# Patient Record
Sex: Female | Born: 1940 | Race: White | Hispanic: Yes | Smoking: Former smoker
Health system: Southern US, Community
[De-identification: ages and names within clinical notes are randomized; demographics above are authoritative.]

## PROBLEM LIST (undated history)

## (undated) DIAGNOSIS — I509 Heart failure, unspecified: Secondary | ICD-10-CM

## (undated) DIAGNOSIS — Z8719 Personal history of other diseases of the digestive system: Secondary | ICD-10-CM

## (undated) DIAGNOSIS — L409 Psoriasis, unspecified: Secondary | ICD-10-CM

## (undated) DIAGNOSIS — R899 Unspecified abnormal finding in specimens from other organs, systems and tissues: Secondary | ICD-10-CM

## (undated) DIAGNOSIS — I10 Essential (primary) hypertension: Secondary | ICD-10-CM

## (undated) DIAGNOSIS — G8929 Other chronic pain: Secondary | ICD-10-CM

## (undated) DIAGNOSIS — E119 Type 2 diabetes mellitus without complications: Secondary | ICD-10-CM

## (undated) DIAGNOSIS — K5730 Diverticulosis of large intestine without perforation or abscess without bleeding: Secondary | ICD-10-CM

## (undated) DIAGNOSIS — M549 Dorsalgia, unspecified: Secondary | ICD-10-CM

## (undated) DIAGNOSIS — F32A Depression, unspecified: Secondary | ICD-10-CM

## (undated) DIAGNOSIS — D649 Anemia, unspecified: Secondary | ICD-10-CM

## (undated) DIAGNOSIS — Z87442 Personal history of urinary calculi: Secondary | ICD-10-CM

## (undated) DIAGNOSIS — M797 Fibromyalgia: Secondary | ICD-10-CM

## (undated) DIAGNOSIS — I350 Nonrheumatic aortic (valve) stenosis: Secondary | ICD-10-CM

## (undated) DIAGNOSIS — M1990 Unspecified osteoarthritis, unspecified site: Secondary | ICD-10-CM

## (undated) DIAGNOSIS — K589 Irritable bowel syndrome without diarrhea: Secondary | ICD-10-CM

## (undated) DIAGNOSIS — K760 Fatty (change of) liver, not elsewhere classified: Secondary | ICD-10-CM

## (undated) DIAGNOSIS — F329 Major depressive disorder, single episode, unspecified: Secondary | ICD-10-CM

## (undated) DIAGNOSIS — Z952 Presence of prosthetic heart valve: Secondary | ICD-10-CM

## (undated) DIAGNOSIS — K644 Residual hemorrhoidal skin tags: Secondary | ICD-10-CM

## (undated) DIAGNOSIS — F419 Anxiety disorder, unspecified: Secondary | ICD-10-CM

## (undated) DIAGNOSIS — E785 Hyperlipidemia, unspecified: Secondary | ICD-10-CM

## (undated) DIAGNOSIS — R51 Headache: Secondary | ICD-10-CM

## (undated) DIAGNOSIS — R519 Headache, unspecified: Secondary | ICD-10-CM

## (undated) DIAGNOSIS — G4730 Sleep apnea, unspecified: Secondary | ICD-10-CM

## (undated) HISTORY — PX: BACK SURGERY: SHX140

## (undated) HISTORY — PX: LUMBAR DISC SURGERY: SHX700

## (undated) HISTORY — DX: Fibromyalgia: M79.7

## (undated) HISTORY — DX: Fatty (change of) liver, not elsewhere classified: K76.0

## (undated) HISTORY — PX: LAPAROSCOPIC CHOLECYSTECTOMY: SUR755

## (undated) HISTORY — DX: Hyperlipidemia, unspecified: E78.5

## (undated) HISTORY — PX: HEMORRHOID BANDING: SHX5850

## (undated) HISTORY — PX: APPENDECTOMY: SHX54

## (undated) HISTORY — DX: Irritable bowel syndrome, unspecified: K58.9

## (undated) HISTORY — DX: Diverticulosis of large intestine without perforation or abscess without bleeding: K57.30

## (undated) HISTORY — PX: CATARACT EXTRACTION W/ INTRAOCULAR LENS  IMPLANT, BILATERAL: SHX1307

## (undated) HISTORY — DX: Residual hemorrhoidal skin tags: K64.4

## (undated) HISTORY — DX: Psoriasis, unspecified: L40.9

## (undated) HISTORY — PX: ABDOMINAL HYSTERECTOMY: SHX81

## (undated) HISTORY — DX: Anxiety disorder, unspecified: F41.9

## (undated) HISTORY — PX: EYE SURGERY: SHX253

## (undated) HISTORY — PX: BREAST LUMPECTOMY: SHX2

## (undated) HISTORY — PX: RETINAL DETACHMENT SURGERY: SHX105

## (undated) HISTORY — PX: ABDOMINAL ADHESION SURGERY: SHX90

## (undated) HISTORY — DX: Essential (primary) hypertension: I10

## (undated) HISTORY — PX: TONSILLECTOMY: SUR1361

---

## 1968-02-23 DIAGNOSIS — Z8711 Personal history of peptic ulcer disease: Secondary | ICD-10-CM

## 1968-02-23 HISTORY — DX: Personal history of peptic ulcer disease: Z87.11

## 1999-04-08 ENCOUNTER — Other Ambulatory Visit: Admission: RE | Admit: 1999-04-08 | Discharge: 1999-04-08 | Payer: Self-pay | Admitting: Gastroenterology

## 1999-04-08 ENCOUNTER — Encounter (INDEPENDENT_AMBULATORY_CARE_PROVIDER_SITE_OTHER): Payer: Self-pay | Admitting: Specialist

## 1999-04-30 ENCOUNTER — Ambulatory Visit (HOSPITAL_COMMUNITY): Admission: RE | Admit: 1999-04-30 | Discharge: 1999-04-30 | Payer: Self-pay | Admitting: Gastroenterology

## 1999-04-30 ENCOUNTER — Encounter: Payer: Self-pay | Admitting: Gastroenterology

## 1999-05-22 ENCOUNTER — Encounter (INDEPENDENT_AMBULATORY_CARE_PROVIDER_SITE_OTHER): Payer: Self-pay | Admitting: Specialist

## 1999-05-22 ENCOUNTER — Other Ambulatory Visit: Admission: RE | Admit: 1999-05-22 | Discharge: 1999-05-22 | Payer: Self-pay | Admitting: Gastroenterology

## 1999-07-21 ENCOUNTER — Encounter: Payer: Self-pay | Admitting: General Surgery

## 1999-07-23 ENCOUNTER — Encounter (INDEPENDENT_AMBULATORY_CARE_PROVIDER_SITE_OTHER): Payer: Self-pay | Admitting: Specialist

## 1999-07-23 ENCOUNTER — Observation Stay (HOSPITAL_COMMUNITY): Admission: RE | Admit: 1999-07-23 | Discharge: 1999-07-24 | Payer: Self-pay | Admitting: General Surgery

## 1999-08-05 ENCOUNTER — Encounter: Payer: Self-pay | Admitting: Family Medicine

## 1999-08-05 ENCOUNTER — Ambulatory Visit (HOSPITAL_COMMUNITY): Admission: RE | Admit: 1999-08-05 | Discharge: 1999-08-05 | Payer: Self-pay | Admitting: Family Medicine

## 2000-11-23 ENCOUNTER — Ambulatory Visit (HOSPITAL_COMMUNITY): Admission: RE | Admit: 2000-11-23 | Discharge: 2000-11-23 | Payer: Self-pay | Admitting: Pediatrics

## 2001-08-22 ENCOUNTER — Encounter: Admission: RE | Admit: 2001-08-22 | Discharge: 2001-08-22 | Payer: Self-pay | Admitting: Family Medicine

## 2001-08-22 ENCOUNTER — Encounter: Payer: Self-pay | Admitting: Family Medicine

## 2003-12-20 ENCOUNTER — Ambulatory Visit (HOSPITAL_COMMUNITY): Admission: RE | Admit: 2003-12-20 | Discharge: 2003-12-20 | Payer: Self-pay | Admitting: Family Medicine

## 2004-07-13 ENCOUNTER — Ambulatory Visit: Payer: Self-pay | Admitting: Gastroenterology

## 2004-07-27 ENCOUNTER — Encounter (INDEPENDENT_AMBULATORY_CARE_PROVIDER_SITE_OTHER): Payer: Self-pay | Admitting: Specialist

## 2004-07-27 ENCOUNTER — Ambulatory Visit: Payer: Self-pay | Admitting: Gastroenterology

## 2004-07-27 DIAGNOSIS — K644 Residual hemorrhoidal skin tags: Secondary | ICD-10-CM | POA: Insufficient documentation

## 2004-07-27 DIAGNOSIS — D126 Benign neoplasm of colon, unspecified: Secondary | ICD-10-CM | POA: Insufficient documentation

## 2004-07-27 DIAGNOSIS — K5730 Diverticulosis of large intestine without perforation or abscess without bleeding: Secondary | ICD-10-CM | POA: Insufficient documentation

## 2007-06-26 ENCOUNTER — Emergency Department (HOSPITAL_COMMUNITY): Admission: EM | Admit: 2007-06-26 | Discharge: 2007-06-26 | Payer: Self-pay | Admitting: Emergency Medicine

## 2007-07-18 ENCOUNTER — Ambulatory Visit: Payer: Self-pay | Admitting: Gastroenterology

## 2007-07-18 DIAGNOSIS — K219 Gastro-esophageal reflux disease without esophagitis: Secondary | ICD-10-CM | POA: Insufficient documentation

## 2007-07-18 DIAGNOSIS — R1319 Other dysphagia: Secondary | ICD-10-CM | POA: Insufficient documentation

## 2007-07-18 DIAGNOSIS — K760 Fatty (change of) liver, not elsewhere classified: Secondary | ICD-10-CM

## 2007-07-18 DIAGNOSIS — R197 Diarrhea, unspecified: Secondary | ICD-10-CM

## 2007-07-18 DIAGNOSIS — E669 Obesity, unspecified: Secondary | ICD-10-CM

## 2007-07-28 ENCOUNTER — Encounter (INDEPENDENT_AMBULATORY_CARE_PROVIDER_SITE_OTHER): Payer: Self-pay | Admitting: *Deleted

## 2007-08-02 ENCOUNTER — Encounter: Payer: Self-pay | Admitting: Gastroenterology

## 2007-08-02 ENCOUNTER — Ambulatory Visit: Payer: Self-pay | Admitting: Gastroenterology

## 2007-08-03 ENCOUNTER — Telehealth (INDEPENDENT_AMBULATORY_CARE_PROVIDER_SITE_OTHER): Payer: Self-pay | Admitting: *Deleted

## 2007-08-03 ENCOUNTER — Telehealth: Payer: Self-pay | Admitting: Gastroenterology

## 2007-08-04 ENCOUNTER — Emergency Department (HOSPITAL_COMMUNITY): Admission: EM | Admit: 2007-08-04 | Discharge: 2007-08-04 | Payer: Self-pay | Admitting: Emergency Medicine

## 2007-08-04 ENCOUNTER — Telehealth: Payer: Self-pay | Admitting: Internal Medicine

## 2007-08-08 ENCOUNTER — Encounter: Payer: Self-pay | Admitting: Gastroenterology

## 2007-08-10 ENCOUNTER — Encounter: Payer: Self-pay | Admitting: Gastroenterology

## 2007-08-10 ENCOUNTER — Ambulatory Visit: Payer: Self-pay | Admitting: Internal Medicine

## 2007-08-22 ENCOUNTER — Ambulatory Visit (HOSPITAL_COMMUNITY): Admission: RE | Admit: 2007-08-22 | Discharge: 2007-08-22 | Payer: Self-pay | Admitting: Orthopedic Surgery

## 2007-08-22 ENCOUNTER — Ambulatory Visit: Payer: Self-pay | Admitting: Gastroenterology

## 2007-08-22 DIAGNOSIS — R1310 Dysphagia, unspecified: Secondary | ICD-10-CM | POA: Insufficient documentation

## 2007-08-22 DIAGNOSIS — E1169 Type 2 diabetes mellitus with other specified complication: Secondary | ICD-10-CM

## 2007-08-24 ENCOUNTER — Encounter (INDEPENDENT_AMBULATORY_CARE_PROVIDER_SITE_OTHER): Payer: Self-pay | Admitting: *Deleted

## 2007-08-24 LAB — CONVERTED CEMR LAB
ALT: 35 units/L (ref 0–35)
AST: 33 units/L (ref 0–37)
Albumin: 3.6 g/dL (ref 3.5–5.2)
Alkaline Phosphatase: 80 units/L (ref 39–117)
BUN: 18 mg/dL (ref 6–23)
Basophils Absolute: 0.1 10*3/uL (ref 0.0–0.1)
Basophils Relative: 0.6 % (ref 0.0–1.0)
Bilirubin, Direct: 0.1 mg/dL (ref 0.0–0.3)
CO2: 28 meq/L (ref 19–32)
Calcium: 9.9 mg/dL (ref 8.4–10.5)
Chloride: 101 meq/L (ref 96–112)
Creatinine, Ser: 1.1 mg/dL (ref 0.4–1.2)
Eosinophils Absolute: 0.2 10*3/uL (ref 0.0–0.7)
Eosinophils Relative: 1.1 % (ref 0.0–5.0)
GFR calc Af Amer: 64 mL/min
GFR calc non Af Amer: 53 mL/min
Glucose, Bld: 215 mg/dL — ABNORMAL HIGH (ref 70–99)
HCT: 42.5 % (ref 36.0–46.0)
Hemoglobin: 14.7 g/dL (ref 12.0–15.0)
Hgb A1c MFr Bld: 9.7 % — ABNORMAL HIGH (ref 4.6–6.0)
INR: 1.2 — ABNORMAL HIGH (ref 0.8–1.0)
Lymphocytes Relative: 32 % (ref 12.0–46.0)
MCHC: 34.5 g/dL (ref 30.0–36.0)
MCV: 89.5 fL (ref 78.0–100.0)
Monocytes Absolute: 0.6 10*3/uL (ref 0.1–1.0)
Monocytes Relative: 3.5 % (ref 3.0–12.0)
Neutro Abs: 11.3 10*3/uL — ABNORMAL HIGH (ref 1.4–7.7)
Neutrophils Relative %: 62.8 % (ref 43.0–77.0)
Platelets: 302 10*3/uL (ref 150–400)
Potassium: 4.2 meq/L (ref 3.5–5.1)
Prothrombin Time: 13.7 s — ABNORMAL HIGH (ref 10.9–13.3)
RBC: 4.75 M/uL (ref 3.87–5.11)
RDW: 13.9 % (ref 11.5–14.6)
Sodium: 140 meq/L (ref 135–145)
TSH: 1.57 microintl units/mL (ref 0.35–5.50)
Total Bilirubin: 0.6 mg/dL (ref 0.3–1.2)
Total Protein: 8.1 g/dL (ref 6.0–8.3)
WBC: 17.9 10*3/uL — ABNORMAL HIGH (ref 4.5–10.5)
aPTT: 29.6 s (ref 21.7–29.8)

## 2007-09-01 ENCOUNTER — Telehealth: Payer: Self-pay | Admitting: Gastroenterology

## 2007-09-04 ENCOUNTER — Ambulatory Visit: Payer: Self-pay | Admitting: Gastroenterology

## 2007-09-04 LAB — CONVERTED CEMR LAB
Bilirubin Urine: NEGATIVE
Hemoglobin, Urine: NEGATIVE
Nitrite: NEGATIVE
Total Protein, Urine: NEGATIVE mg/dL

## 2007-09-07 ENCOUNTER — Ambulatory Visit: Payer: Self-pay | Admitting: Cardiovascular Disease

## 2007-09-08 ENCOUNTER — Encounter (INDEPENDENT_AMBULATORY_CARE_PROVIDER_SITE_OTHER): Payer: Self-pay | Admitting: *Deleted

## 2007-10-02 ENCOUNTER — Telehealth: Payer: Self-pay | Admitting: Gastroenterology

## 2007-12-11 ENCOUNTER — Telehealth (INDEPENDENT_AMBULATORY_CARE_PROVIDER_SITE_OTHER): Payer: Self-pay

## 2010-03-15 ENCOUNTER — Encounter: Payer: Self-pay | Admitting: Gastroenterology

## 2010-05-18 ENCOUNTER — Other Ambulatory Visit: Payer: Self-pay | Admitting: Family Medicine

## 2010-05-18 DIAGNOSIS — R109 Unspecified abdominal pain: Secondary | ICD-10-CM

## 2010-05-21 ENCOUNTER — Ambulatory Visit
Admission: RE | Admit: 2010-05-21 | Discharge: 2010-05-21 | Disposition: A | Discharge status: Home / Self Care | Payer: Medicare Other | Source: Ambulatory Visit | Attending: Family Medicine | Admitting: Family Medicine

## 2010-05-21 DIAGNOSIS — R109 Unspecified abdominal pain: Secondary | ICD-10-CM

## 2010-05-21 MED ORDER — IOHEXOL 300 MG/ML  SOLN
125.0000 mL | Freq: Once | INTRAMUSCULAR | Status: AC | PRN
  Administered 2010-05-21: 125 mL via INTRAVENOUS

## 2010-05-26 ENCOUNTER — Ambulatory Visit: Payer: Self-pay | Admitting: Gastroenterology

## 2010-06-16 ENCOUNTER — Ambulatory Visit: Payer: Self-pay | Admitting: Gastroenterology

## 2010-06-30 ENCOUNTER — Ambulatory Visit: Payer: Self-pay | Admitting: Gastroenterology

## 2010-07-10 NOTE — Op Note (Signed)
[facility]  Patient:    [name], [name]                       MRN: [id number] Adm. Date:  [id number] Disc. Date: [id number] Attending:  Arlis Porta CC:         Vania Rea. Jarold Motto, M.D. [facility]             Dr. Purnell Shoemaker, [facility] [place]                           Operative Report  PREOPERATIVE DIAGNOSIS:  Chronic calculous cholecystitis.  POSTOPERATIVE DIAGNOSIS:  Chronic calculous cholecystitis.  PROCEDURE:  Laparoscopic cholecystectomy.  SURGEON:  Adolph Pollack, M.D.  ASSISTANT:  None.  ANESTHESIA:  General.  INDICATION:  Ms. Elrod is a 70 year old female with some epigastric pain radiating around to the back.  She has also had diarrhea.  Dr. Sheryn Bison has done an extensive evaluation on her.  Her ultrasound demonstrated at least a 1.8 cm gallstone.  She has normal liver function tests.  It is felt that her abdominal pain may be biliary colic in nature, and she now presents for an elective cholecystectomy.  DESCRIPTION OF PROCEDURE:  She was placed supine on the operating table, and a general anesthetic was administered.  Her abdomen was sterilely prepped and draped.  Marcaine 0.5% plain was infiltrated in the subumbilical region, and a longitudinal subumbilical incision was made through the skin and carried down through subcutaneous tissue.  The midline fascia was identified and a 1 cm incision made in the midline fascia.  The peritoneal cavity was then entered bluntly and under direct vision.  A pursestring suture of 0 Vicryl was placed around the fascial edges.  A Hasson trocar was introduced to the peritoneal cavity and a pneumoperitoneum created by insufflation of CO2 gas.  Next, the patient was positioned appropriately.  An 11 mm trocar was placed through an epigastric incision and two 5 mm trocars placed in the right abdomen, all under direct vision.  The fundus of the gallbladder was grasped and retracted toward the  right shoulder.  Adhesions between the body of the gallbladder, omentum, and duodenum were noted and taken down bluntly.  I was then able to grasp the infundibulum and retract it laterally.  Using careful blunt dissection, anterior branch of the cystic artery was identified, clipped twice proximally and once distally, and divided.  The cystic duct was then identified and skeletonized for a short direction.  It was clipped three times proximally, once distally, and divided.  The posterior branch of the cystic artery was then identified, clipped, and divided.  The gallbladder was then dissected free from the liver bed using electrocautery.  There was a small hole made in the gallbladder, but the stone did not leak out.  There was spill of bile. Once the gallbladder was removed from the liver bed, it was placed in an Endopouch bag.  Next, the perihepatic area and the gallbladder fossa were irrigated with saline.  There was still some bleeding noted, and this was controlled with cautery and a piece of Surgicel.  There was no active bile leak that I could see.  I then evacuated as much of the fluid as possible.  I was pulling the Endopouch bag with the gallbladder out through the subumbilical incision when the bag broke and the gallstone spilled into the abdominal cavity.  I took a second Endopouch  bag, retrieved the gallstone, and pulled it out.  There was minimal bile spillage into the abdominal cavity with this maneuver.  I then closed the subumbilical fascial defect with interrupted 3-0 Vicryl sutures under direct vision.  I then reinsufflated the abdomen with CO2 gas.  I reinspected the gallbladder fossa and again noted no bleeding or bile leakage and evacuated residual fluid.  The remaining trocars were removed and pneumoperitoneum was released.  The skin incisions were closed with 4-0 Monocryl subcuticular stitches, followed by Steri-Strips and sterile dressings.  She tolerated the  procedure well without any apparent complications and was taken to the recovery room in satisfactory condition. DD:  07/23/99 TD:  07/27/99 Job: [id number]

## 2010-07-17 ENCOUNTER — Telehealth: Payer: Self-pay | Admitting: Gastroenterology

## 2010-07-17 ENCOUNTER — Encounter: Payer: Self-pay | Admitting: *Deleted

## 2010-07-17 ENCOUNTER — Ambulatory Visit: Payer: Self-pay | Admitting: Gastroenterology

## 2010-07-17 NOTE — Telephone Encounter (Signed)
D/c needed.Marland KitchenMarland Kitchen

## 2010-07-17 NOTE — Telephone Encounter (Signed)
Letter done and will send to medical records to have them mail out to pt.

## 2010-07-17 NOTE — Telephone Encounter (Signed)
Dr Jarold Motto this patient has been on your schedule 4 times.   4/3 -- cx same day 4/24 -- cx day before 5/8 -- cx day before 5/25 -- cx same day.  Would you like to continue to see this patient??

## 2010-07-17 NOTE — Telephone Encounter (Signed)
Dc letter done and routed to DRP to sign.

## 2010-07-21 ENCOUNTER — Telehealth: Payer: Self-pay | Admitting: Gastroenterology

## 2010-07-21 NOTE — Telephone Encounter (Signed)
Dismissal Letter was sent by Certified Mail on 07/21/2010  Dismissal Letter returned and sent back out by 1st Class Mail 08/10/2010

## 2010-08-20 ENCOUNTER — Other Ambulatory Visit (HOSPITAL_COMMUNITY): Payer: Self-pay | Admitting: Family Medicine

## 2010-08-20 DIAGNOSIS — Z1231 Encounter for screening mammogram for malignant neoplasm of breast: Secondary | ICD-10-CM

## 2010-09-02 ENCOUNTER — Ambulatory Visit: Payer: Self-pay | Admitting: Gastroenterology

## 2010-09-07 ENCOUNTER — Ambulatory Visit (HOSPITAL_COMMUNITY)
Admission: RE | Admit: 2010-09-07 | Discharge: 2010-09-07 | Disposition: A | Discharge status: Home / Self Care | Payer: Medicare Other | Source: Ambulatory Visit | Attending: Family Medicine | Admitting: Family Medicine

## 2010-09-07 DIAGNOSIS — Z1231 Encounter for screening mammogram for malignant neoplasm of breast: Secondary | ICD-10-CM | POA: Insufficient documentation

## 2010-09-25 ENCOUNTER — Telehealth: Payer: Self-pay | Admitting: Gastroenterology

## 2010-09-25 NOTE — Telephone Encounter (Signed)
Message copied by Arna Snipe on Fri Sep 25, 2010  2:53 PM ------      Message from: Harlow Mares D      Created: Tue May 26, 2010  9:01 AM       Please charge patient for same day cx per Dr Jarold Motto.                   ----- Message -----         From: Karna Christmas         Sent: 05/26/2010   8:43 AM           To: Harlow Mares, CMA            Pt. resch'd her appt. Until 06-16-10 b/c she is waiting on her PCP to call her with test results before seeing Dr. Jarold Motto

## 2010-11-19 LAB — CBC
HCT: 40.2
Hemoglobin: 13.7
Platelets: 199
WBC: 11.3 — ABNORMAL HIGH

## 2010-11-19 LAB — DIFFERENTIAL
Eosinophils Relative: 3
Lymphocytes Relative: 37
Lymphs Abs: 4.2 — ABNORMAL HIGH
Monocytes Absolute: 0.5
Neutro Abs: 6.2

## 2011-08-06 ENCOUNTER — Encounter (HOSPITAL_COMMUNITY): Payer: Self-pay | Admitting: Emergency Medicine

## 2011-08-06 ENCOUNTER — Emergency Department (HOSPITAL_COMMUNITY)
Admission: EM | Admit: 2011-08-06 | Discharge: 2011-08-06 | Disposition: A | Discharge status: Home Health | Payer: Medicare Other | Attending: Emergency Medicine | Admitting: Emergency Medicine

## 2011-08-06 ENCOUNTER — Emergency Department (HOSPITAL_COMMUNITY): Payer: Medicare Other

## 2011-08-06 DIAGNOSIS — I10 Essential (primary) hypertension: Secondary | ICD-10-CM | POA: Insufficient documentation

## 2011-08-06 DIAGNOSIS — Z885 Allergy status to narcotic agent status: Secondary | ICD-10-CM | POA: Insufficient documentation

## 2011-08-06 DIAGNOSIS — R0600 Dyspnea, unspecified: Secondary | ICD-10-CM

## 2011-08-06 DIAGNOSIS — N390 Urinary tract infection, site not specified: Secondary | ICD-10-CM

## 2011-08-06 DIAGNOSIS — Z87891 Personal history of nicotine dependence: Secondary | ICD-10-CM | POA: Insufficient documentation

## 2011-08-06 DIAGNOSIS — Z880 Allergy status to penicillin: Secondary | ICD-10-CM | POA: Insufficient documentation

## 2011-08-06 DIAGNOSIS — E785 Hyperlipidemia, unspecified: Secondary | ICD-10-CM | POA: Insufficient documentation

## 2011-08-06 DIAGNOSIS — R0602 Shortness of breath: Secondary | ICD-10-CM | POA: Insufficient documentation

## 2011-08-06 DIAGNOSIS — Z79899 Other long term (current) drug therapy: Secondary | ICD-10-CM | POA: Insufficient documentation

## 2011-08-06 DIAGNOSIS — Z882 Allergy status to sulfonamides status: Secondary | ICD-10-CM | POA: Insufficient documentation

## 2011-08-06 DIAGNOSIS — R21 Rash and other nonspecific skin eruption: Secondary | ICD-10-CM | POA: Insufficient documentation

## 2011-08-06 LAB — URINE MICROSCOPIC-ADD ON

## 2011-08-06 LAB — DIFFERENTIAL
Basophils Absolute: 0 10*3/uL (ref 0.0–0.1)
Eosinophils Relative: 3 % (ref 0–5)
Lymphocytes Relative: 41 % (ref 12–46)
Monocytes Absolute: 0.7 10*3/uL (ref 0.1–1.0)
Monocytes Relative: 5 % (ref 3–12)

## 2011-08-06 LAB — COMPREHENSIVE METABOLIC PANEL
AST: 19 U/L (ref 0–37)
BUN: 12 mg/dL (ref 6–23)
CO2: 23 mEq/L (ref 19–32)
Calcium: 9.2 mg/dL (ref 8.4–10.5)
Creatinine, Ser: 0.65 mg/dL (ref 0.50–1.10)
GFR calc Af Amer: >90 mL/min (ref >90)
GFR calc non Af Amer: 89 mL/min — ABNORMAL LOW (ref >90)
Glucose, Bld: 111 mg/dL — ABNORMAL HIGH (ref 70–99)
Total Bilirubin: 0.2 mg/dL — ABNORMAL LOW (ref 0.3–1.2)

## 2011-08-06 LAB — URINALYSIS, ROUTINE W REFLEX MICROSCOPIC
Ketones, ur: NEGATIVE mg/dL
Protein, ur: NEGATIVE mg/dL
Urobilinogen, UA: 0.2 mg/dL (ref 0.0–1.0)

## 2011-08-06 LAB — CBC
HCT: 41 % (ref 36.0–46.0)
Hemoglobin: 13.4 g/dL (ref 12.0–15.0)
MCV: 89.7 fL (ref 78.0–100.0)
RDW: 14 % (ref 11.5–15.5)
WBC: 12.6 10*3/uL — ABNORMAL HIGH (ref 4.0–10.5)

## 2011-08-06 LAB — LIPASE, BLOOD: Lipase: 20 U/L (ref 11–59)

## 2011-08-06 LAB — D-DIMER, QUANTITATIVE: D-Dimer, Quant: 0.33 ug/mL-FEU (ref 0.00–0.48)

## 2011-08-06 MED ORDER — CEFUROXIME AXETIL 250 MG PO TABS: 250.0000 mg | ORAL_TABLET | Freq: Two times a day (BID) | ORAL | Status: AC

## 2011-08-06 MED ORDER — CIPROFLOXACIN HCL 250 MG PO TABS: 250.0000 mg | ORAL_TABLET | Freq: Two times a day (BID) | ORAL | Status: DC

## 2011-08-06 NOTE — ED Notes (Signed)
Discharge instructions reviewed with pt; verbalizes understanding.  No questions asked; No further c/o's voiced.  Pt to lobby via wheelchair.  NAD noted.

## 2011-08-06 NOTE — ED Notes (Signed)
Pt c/o pain in between shoulder blades x 3 days and SOB starting last night; pt speaking complete sentences at present; pt sts some cough

## 2011-08-06 NOTE — ED Provider Notes (Signed)
History     CSN: [id number]  Arrival date & time 08/06/11  4098   First MD Initiated Contact with Patient 08/06/11 [phone]      Chief Complaint  Patient presents with  . Shortness of Breath  . Back Pain    (Consider location/radiation/quality/duration/timing/severity/associated sxs/prior treatment) HPI Pt has had episode of SOB 2 days ago between 3-4 AM. Pt related eating heavy meal the night before and thought symptoms likely "indigestion". SOB resolved. She then had another episode this morning  around 0400 not associated with any pain. To baby aspirin, goody powder. SOB now mostly resolved. Pt denies fever, chills, productive cough, chest pain, back pain, recent travel or surgeries.  Past Medical History  Diagnosis Date  . Esophageal reflux   . Obesity, unspecified   . Diarrhea   . External hemorrhoids without mention of complication   . Personal history of colonic polyps     adenomatous & TUBULAR ADENOMA  . Diverticulosis of colon (without mention of hemorrhage)   . Psoriasis   . Fibromyalgia   . Hypertension   . IBS (irritable bowel syndrome)   . Hyperlipemia   . Fatty liver disease, nonalcoholic     Past Surgical History  Procedure Date  . Appendectomy   . Vaginal hysterectomy   . Cholecystectomy   . Back surgery     Family History  Problem Relation Age of Onset  . Lung cancer Mother   . Diabetes Maternal Grandmother   . Heart disease Maternal Grandfather   . Colon cancer Neg Hx     History  Substance Use Topics  . Smoking status: Former Games developer  . Smokeless tobacco: Never Used  . Alcohol Use: No    OB History    Grav Para Term Preterm Abortions TAB SAB Ect Mult Living                  Review of Systems  Constitutional: Negative for fever and chills.  HENT: Negative for neck pain.   Respiratory: Positive for shortness of breath. Negative for cough and wheezing.   Cardiovascular: Negative for chest pain, palpitations and leg swelling.    Gastrointestinal: Negative for nausea, vomiting and abdominal pain.  Musculoskeletal: Negative for back pain.  Skin: Negative for rash and wound.  Neurological: Negative for dizziness, weakness, light-headedness, numbness and headaches.    Allergies  Sulfonamide derivatives; Amoxicillin; and Hydrocodone  Home Medications   Current Outpatient Rx  Name Route Sig Dispense Refill  . LISINOPRIL-HYDROCHLOROTHIAZIDE 20-25 MG PO TABS Oral Take 1 tablet by mouth daily.     Marland Kitchen METFORMIN HCL ER (MOD) 1000 MG PO TB24 Oral Take 500 mg by mouth 2 (two) times daily with a meal.     . PRAVASTATIN SODIUM 40 MG PO TABS Oral Take 40 mg by mouth daily.      Marland Kitchen CIPROFLOXACIN HCL 250 MG PO TABS Oral Take 1 tablet (250 mg total) by mouth every 12 (twelve) hours. 10 tablet 0    BP 176/70  Pulse 68  Temp 97.7 F (36.5 C) (Oral)  Resp 18  SpO2 98%  Physical Exam  Nursing note and vitals reviewed. Constitutional: She is oriented to person, place, and time. She appears well-developed and well-nourished. No distress.       Central obesity  HENT:  Head: Normocephalic and atraumatic.  Mouth/Throat: Oropharynx is clear and moist.  Eyes: EOM are normal. Pupils are equal, round, and reactive to light.  Neck: Normal range of motion. Neck supple.  Cardiovascular: Normal rate and regular rhythm.   Pulmonary/Chest: Effort normal and breath sounds normal. No respiratory distress. She has no wheezes. She has no rales. She exhibits no tenderness.  Abdominal: Soft. Bowel sounds are normal. There is no tenderness. There is no rebound and no guarding.  Musculoskeletal: Normal range of motion. She exhibits no edema and no tenderness.       No back tenderness to palpation.  No calf swelling or tenderness  Neurological: She is alert and oriented to person, place, and time.       5/5 motor, sensation intact  Skin: Skin is warm and dry. Rash (Psoriasis rash on extensor surfaces) noted. No erythema.  Psychiatric: She has  a normal mood and affect. Her behavior is normal.    ED Course  Procedures (including critical care time)  Labs Reviewed  CBC - Abnormal; Notable for the following:    WBC 12.6 (*)     All other components within normal limits  DIFFERENTIAL - Abnormal; Notable for the following:    Lymphs Abs 5.2 (*)     All other components within normal limits  COMPREHENSIVE METABOLIC PANEL - Abnormal; Notable for the following:    Glucose, Bld 111 (*)     Albumin 3.2 (*)     Total Bilirubin 0.2 (*)     GFR calc non Af Amer 89 (*)     All other components within normal limits  URINALYSIS, ROUTINE W REFLEX MICROSCOPIC - Abnormal; Notable for the following:    APPearance HAZY (*)     Leukocytes, UA SMALL (*)     All other components within normal limits  URINE MICROSCOPIC-ADD ON - Abnormal; Notable for the following:    Bacteria, UA FEW (*)     Casts GRANULAR CAST (*)  HYALINE CASTS   Crystals CA OXALATE CRYSTALS (*)     All other components within normal limits  LIPASE, BLOOD  D-DIMER, QUANTITATIVE  POCT I-STAT TROPONIN I   Dg Chest 2 View  08/06/2011  *RADIOLOGY REPORT*  Clinical Data: Shortness of breath, increased fluid  CHEST - 2 VIEW  Comparison: Chest x-ray of 06/26/2007  Findings: The lungs are clear.  Heart is borderline enlarged. There are degenerative changes throughout the thoracic spine.  IMPRESSION: Borderline cardiomegaly.  No active lung disease.  Original Report Authenticated By: Juline Patch, M.D.     1. Dyspnea   2. UTI (lower urinary tract infection)      Date: 08/06/2011  Rate: 70  Rhythm: normal sinus rhythm  QRS Axis: normal  Intervals: normal  ST/T Wave abnormalities: normal  Conduction Disutrbances:left anterior fascicular block  Narrative Interpretation:   Old EKG Reviewed: unchanged    MDM  Pt is very well appearing.   Pt remains symptom free in ED. Work up reveal UTI. Unsure whether this is cause of pt's symptoms. F/u with PMD or return for  concerns  Loren Racer, MD 08/06/11 1130

## 2013-09-14 ENCOUNTER — Ambulatory Visit (HOSPITAL_COMMUNITY): Payer: Medicare Other | Attending: Orthopedic Surgery | Admitting: Radiology

## 2013-09-14 ENCOUNTER — Other Ambulatory Visit (HOSPITAL_COMMUNITY): Payer: Self-pay | Admitting: Family Medicine

## 2013-09-14 DIAGNOSIS — I059 Rheumatic mitral valve disease, unspecified: Secondary | ICD-10-CM | POA: Diagnosis not present

## 2013-09-14 DIAGNOSIS — R011 Cardiac murmur, unspecified: Secondary | ICD-10-CM | POA: Insufficient documentation

## 2013-09-14 NOTE — Progress Notes (Signed)
Echocardiogram performed.  

## 2013-10-05 ENCOUNTER — Other Ambulatory Visit: Payer: Self-pay | Admitting: Cardiology

## 2013-10-05 ENCOUNTER — Ambulatory Visit
Admission: RE | Admit: 2013-10-05 | Discharge: 2013-10-05 | Disposition: A | Discharge status: Home / Self Care | Payer: Medicare Other | Source: Ambulatory Visit | Attending: Cardiology | Admitting: Cardiology

## 2013-10-05 DIAGNOSIS — R0609 Other forms of dyspnea: Secondary | ICD-10-CM

## 2013-10-05 DIAGNOSIS — R0989 Other specified symptoms and signs involving the circulatory and respiratory systems: Principal | ICD-10-CM

## 2013-10-24 ENCOUNTER — Encounter: Payer: Self-pay | Admitting: Cardiology

## 2013-10-24 NOTE — Progress Notes (Signed)
Patient ID: [name], female   DOB: 1940/10/27, 73 y.o.   MRN: [id number]   [name]    Date of visit:  10/19/2013 DOB:  1941/01/02    Age:  61 yrs. Medical record number:  [id number]     Account number:  [id number] Primary Care Provider: Claris Gower ____________________________ CURRENT DIAGNOSES  1. Dyspnea  2. Aortic Valve-Stenosis  3. Hypertensive Heart Disease-Benign without CHF  4. Diabetes Mellitus-NIDD  5. Obesity, morbid (BMI>40) ____________________________ ALLERGIES  Castor Oil, Syncope  Codeine, Vomiting  Sulfa (Sulfonamide Antibiotics), Intolerance-unknown ____________________________ MEDICATIONS  1. metformin 1,000 mg tablet, BID  2. enalapril maleate 10 mg tablet, 1 p.o. daily  3. vitamin B complex tablet, BID  4. Vitamin D3 5,000 unit tablet, 1 p.o. daily  5. astragalus root extract (bulk) 10:1 powder, 2  tabs tid  6. multivitamin tablet, 1 p.o. daily  7. vitamin E 400 unit capsule, BID  8. magnesium 250 mg tablet, 1 p.o. daily  9. herbal drugs tablet, tummeric q d  10. garlic tablet, PRN  11. glimepiride 4 mg tablet, 1.5 p.o. daily  12. furosemide 80 mg tablet, 1 p.o. daily ____________________________ CHIEF COMPLAINTS  Followup of Aortic Valve-Stenosis  Followup of Hypertensive Heart Disease-Benign without CHF ____________________________ HISTORY OF PRESENT ILLNESS This very nice 73 year old female is seen for evaluation of aortic stenosis. She has a prior history of diabetes mellitus and severe morbid obesity as well as hypertensive heart disease. For the past year she has had difficulty with progressive edema as well as worsening dyspnea on exertion. She does not have any chest pressure suggestive of angina. She does not have PND or orthopnea. On a recent visit to her endocrinologist she was found to have a systolic murmur and saw Dr. Arelia Sneddon who sent her for an echo. The echo was technically difficult but showed at least moderate aortic stenosis with a  mean gradient of around 28 mm. The patient has been taking furosemide on a daily basis but does note continuous edema and also has been drinking a lot of water. She has not had any syncope but does become quite winded when she tries to do activity. There is no history of rheumatic fever. She has a history of a retinal detachment but does not complain of any other complications from her diabetes. She does have a history of psoriasis.  Patient seen for cardiac followup on August 28 and  here she has been fluid restricting and has lost 5 pounds according to her primary doctor's scales but is up according to our scales. Her edema has gone down some. Blood pressure remains elevated here but was evidently normotensive at home and at Dr. Tretha Sciara office. As a result she did not take a higher dose of enalapril. Her BNP level was low when it was checked before. She is no longer having PND and her dyspnea is variable. She has no chest pain suggestive of angina. ____________________________ PAST HISTORY  Past Medical Illnesses:  morbid obesity, hypertension, DM-non-insulin dependent, hyperlipidemia, lumbar disc disease, psoriasis;  Cardiovascular Illnesses:  aortic stenosis;  Surgical Procedures:  appendectomy, cholecystectomy, hysterectomy, od surg, back surgery, breast biopsy, hemorrhoidectomy, tubal ligation;  Cardiology Procedures-Invasive:  no history of prior cardiac procedures;  Cardiology Procedures-Noninvasive:  echocardiogram July 2015;  LVEF of 60% documented via echocardiogram on 09/14/2013,   ____________________________ CARDIO-PULMONARY TEST DATES EKG Date:  10/05/2013;  Echocardiography Date: 09/14/2013;  Chest Xray Date: 10/05/2013;   ____________________________ FAMILY HISTORY Brother -- Brother alive and well Brother --  Brother alive with problem, Cancer Father -- Father dead, Medical history unknown Mother -- Mother dead, Pulmonary emphysema ____________________________ SOCIAL HISTORY Alcohol  Use:  no alcohol use;  Smoking:  used to smoke but quit 2003, 50 pack year history;  Diet:  vegetarian;  Lifestyle:  widowed and 3 sons;  Exercise:  no regular exercise;  Occupation:  retired and Conservation officer, [employer];  Residence:  lives with son;   ____________________________ REVIEW OF SYSTEMS General:  severe obesity, malaise and fatigue  Integumentary:psoriasis Eyes: wears eye glasses/contact lenses, retinal detachment Respiratory: dyspnea with exertion Cardiovascular:  please review HPI Abdominal: denies dyspepsia, GI bleeding, constipation, or diarrhea Genitourinary-Female: frequency, stress incontinence Musculoskeletal:  chronic low back pain, arthritis of the hips Neurological:  denies headaches, stroke, or TIA  ____________________________ PHYSICAL EXAMINATION VITAL SIGNS  Blood Pressure:  152/80 Sitting, Right arm, large cuff  , 160/82 Standing, Right arm and large cuff   Pulse:  78/min. Weight:  298.00 lbs. Height:  62" BMI: 54  Constitutional:  pleasant white female, in no acute distress, severely obese Skin:  psoriasis Head:  normocephalic, normal hair pattern, no masses or tenderness Eyes:  EOMS Intact, PERRLA, C and S clear, Funduscopic exam not done. ENT:  ears, nose and throat reveal no gross abnormalities.  Dentition good. Neck:  supple, without massess. No JVD, thyromegaly or carotid bruits. Carotid upstroke normal. Chest:  normal symmetry, clear to auscultation. Cardiac:  regular rhythm, normal S1, S2 diminished, no S3 or S4, grade 2/6 systolic murmur at aortic area radiating to neck Abdomen:  non-tender, severely obese Peripheral Pulses:  pedal pulses diffcult to feel Extremities & Back:  2+ edema Neurological:  no gross motor or sensory deficits noted, affect appropriate, oriented x3. ____________________________ IMPRESSIONS/PLAN 1. Aortic stenosis moderate to severe 2. Continued peripheral edema 3. Severe morbid obesity 4. Hypertensive heart disease still  elevated  Recommendations:  She is clinically better but her weight is unchanged and her blood pressure remains elevated at my office. Recommended that she increase her furosemide 80 mg twice daily. I spoke with Dr. [name] about doing a cardiac cath through the radial approach to further assess the aortic valve gradient in view of her morbid obesity in light of her clinical dyspnea and fluid retention. He will see her in short stay and the patient is agreeable to proceed.    ____________________________ TODAYS ORDERS  1. Return Visit: 1 month                       ____________________________ Cardiology Physician:  Kerry Hough MD [facility]

## 2013-11-12 ENCOUNTER — Encounter (HOSPITAL_COMMUNITY): Payer: Self-pay

## 2013-11-15 ENCOUNTER — Encounter (HOSPITAL_COMMUNITY)
Admission: RE | Disposition: A | Discharge status: Home / Self Care | Payer: Self-pay | Source: Ambulatory Visit | Attending: Cardiovascular Disease

## 2013-11-15 ENCOUNTER — Ambulatory Visit (HOSPITAL_COMMUNITY)
Admission: RE | Admit: 2013-11-15 | Discharge: 2013-11-15 | Disposition: A | Discharge status: Home / Self Care | Payer: Medicare Other | Source: Ambulatory Visit | Attending: Cardiovascular Disease | Admitting: Cardiovascular Disease

## 2013-11-15 DIAGNOSIS — E119 Type 2 diabetes mellitus without complications: Secondary | ICD-10-CM | POA: Diagnosis not present

## 2013-11-15 DIAGNOSIS — I119 Hypertensive heart disease without heart failure: Secondary | ICD-10-CM | POA: Insufficient documentation

## 2013-11-15 DIAGNOSIS — E785 Hyperlipidemia, unspecified: Secondary | ICD-10-CM | POA: Diagnosis not present

## 2013-11-15 DIAGNOSIS — I359 Nonrheumatic aortic valve disorder, unspecified: Secondary | ICD-10-CM | POA: Insufficient documentation

## 2013-11-15 DIAGNOSIS — Z6841 Body Mass Index (BMI) 40.0 and over, adult: Secondary | ICD-10-CM | POA: Insufficient documentation

## 2013-11-15 HISTORY — PX: LEFT AND RIGHT HEART CATHETERIZATION WITH CORONARY ANGIOGRAM: SHX5449

## 2013-11-15 LAB — POCT I-STAT 3, VENOUS BLOOD GAS (G3P V)
Acid-Base Excess: 3 mmol/L — ABNORMAL HIGH (ref 0.0–2.0)
Acid-Base Excess: 5 mmol/L — ABNORMAL HIGH (ref 0.0–2.0)
Bicarbonate: 28 mEq/L — ABNORMAL HIGH (ref 20.0–24.0)
Bicarbonate: 29.7 mEq/L — ABNORMAL HIGH (ref 20.0–24.0)
O2 Saturation: 64 %
O2 Saturation: 68 %
PCO2 VEN: 43.2 mmHg — AB (ref 45.0–50.0)
PH VEN: 7.434 — AB (ref 7.250–7.300)
PH VEN: 7.446 — AB (ref 7.250–7.300)
PO2 VEN: 35 mmHg (ref 30.0–45.0)
TCO2: 29 mmol/L (ref 0–100)
TCO2: 31 mmol/L (ref 0–100)
pCO2, Ven: 41.8 mmHg — ABNORMAL LOW (ref 45.0–50.0)
pO2, Ven: 32 mmHg (ref 30.0–45.0)

## 2013-11-15 LAB — BASIC METABOLIC PANEL
Anion gap: 12 (ref 5–15)
BUN: 16 mg/dL (ref 6–23)
CALCIUM: 8.9 mg/dL (ref 8.4–10.5)
CO2: 29 mEq/L (ref 19–32)
Chloride: 100 mEq/L (ref 96–112)
Creatinine, Ser: 0.67 mg/dL (ref 0.50–1.10)
GFR calc Af Amer: >90 mL/min (ref >90)
GFR, EST NON AFRICAN AMERICAN: 86 mL/min — AB (ref >90)
GLUCOSE: 144 mg/dL — AB (ref 70–99)
POTASSIUM: 4.3 meq/L (ref 3.7–5.3)
SODIUM: 141 meq/L (ref 137–147)

## 2013-11-15 LAB — POCT I-STAT 3, ART BLOOD GAS (G3+)
Acid-Base Excess: 6 mmol/L — ABNORMAL HIGH (ref 0.0–2.0)
BICARBONATE: 29.1 meq/L — AB (ref 20.0–24.0)
O2 Saturation: 96 %
PO2 ART: 72 mmHg — AB (ref 80.0–100.0)
TCO2: 30 mmol/L (ref 0–100)
pCO2 arterial: 36.4 mmHg (ref 35.0–45.0)
pH, Arterial: 7.511 — ABNORMAL HIGH (ref 7.350–7.450)

## 2013-11-15 LAB — CBC
HCT: 37.2 % (ref 36.0–46.0)
HEMOGLOBIN: 12.3 g/dL (ref 12.0–15.0)
MCH: 29.7 pg (ref 26.0–34.0)
MCHC: 33.1 g/dL (ref 30.0–36.0)
MCV: 89.9 fL (ref 78.0–100.0)
Platelets: 180 10*3/uL (ref 150–400)
RBC: 4.14 MIL/uL (ref 3.87–5.11)
RDW: 14.9 % (ref 11.5–15.5)
WBC: 11.8 10*3/uL — ABNORMAL HIGH (ref 4.0–10.5)

## 2013-11-15 LAB — PROTIME-INR
INR: 1.02 (ref 0.00–1.49)
Prothrombin Time: 13.4 seconds (ref 11.6–15.2)

## 2013-11-15 LAB — GLUCOSE, CAPILLARY: Glucose-Capillary: 135 mg/dL — ABNORMAL HIGH (ref 70–99)

## 2013-11-15 SURGERY — LEFT AND RIGHT HEART CATHETERIZATION WITH CORONARY ANGIOGRAM
Anesthesia: LOCAL

## 2013-11-15 MED ORDER — ACETAMINOPHEN 325 MG PO TABS
ORAL_TABLET | ORAL | Status: AC
  Administered 2013-11-15: 650 mg via ORAL
  Filled 2013-11-15: qty 2

## 2013-11-15 MED ORDER — ACETAMINOPHEN 325 MG PO TABS
650.0000 mg | ORAL_TABLET | ORAL | Status: DC | PRN
  Administered 2013-11-15: 650 mg via ORAL

## 2013-11-15 MED ORDER — FENTANYL CITRATE 0.05 MG/ML IJ SOLN
INTRAMUSCULAR | Status: AC
  Filled 2013-11-15: qty 2

## 2013-11-15 MED ORDER — HEPARIN SODIUM (PORCINE) 1000 UNIT/ML IJ SOLN
INTRAMUSCULAR | Status: AC
  Filled 2013-11-15: qty 1

## 2013-11-15 MED ORDER — SODIUM CHLORIDE 0.9 % IV SOLN
INTRAVENOUS | Status: DC
  Administered 2013-11-15: 10:00:00 via INTRAVENOUS

## 2013-11-15 MED ORDER — ASPIRIN 81 MG PO CHEW
CHEWABLE_TABLET | ORAL | Status: AC
  Administered 2013-11-15: 81 mg via ORAL
  Filled 2013-11-15: qty 1

## 2013-11-15 MED ORDER — SODIUM CHLORIDE 0.9 % IJ SOLN: 3.0000 mL | INTRAMUSCULAR | Status: DC | PRN

## 2013-11-15 MED ORDER — MIDAZOLAM HCL 2 MG/2ML IJ SOLN
INTRAMUSCULAR | Status: AC
  Filled 2013-11-15: qty 2

## 2013-11-15 MED ORDER — SODIUM CHLORIDE 0.9 % IV SOLN: 1.0000 mL/kg/h | INTRAVENOUS | Status: DC

## 2013-11-15 MED ORDER — LIDOCAINE HCL (PF) 1 % IJ SOLN
INTRAMUSCULAR | Status: AC
  Filled 2013-11-15: qty 30

## 2013-11-15 MED ORDER — NITROGLYCERIN 1 MG/10 ML FOR IR/CATH LAB
INTRA_ARTERIAL | Status: AC
  Filled 2013-11-15: qty 10

## 2013-11-15 MED ORDER — HEPARIN (PORCINE) IN NACL 2-0.9 UNIT/ML-% IJ SOLN
INTRAMUSCULAR | Status: AC
  Filled 2013-11-15: qty 500

## 2013-11-15 MED ORDER — SODIUM CHLORIDE 0.9 % IV SOLN: 250.0000 mL | INTRAVENOUS | Status: DC | PRN

## 2013-11-15 MED ORDER — ONDANSETRON HCL 4 MG/2ML IJ SOLN: 4.0000 mg | Freq: Four times a day (QID) | INTRAMUSCULAR | Status: DC | PRN

## 2013-11-15 MED ORDER — ASPIRIN 81 MG PO CHEW
81.0000 mg | CHEWABLE_TABLET | ORAL | Status: AC
  Administered 2013-11-15: 81 mg via ORAL

## 2013-11-15 MED ORDER — SODIUM CHLORIDE 0.9 % IJ SOLN: 3.0000 mL | Freq: Two times a day (BID) | INTRAMUSCULAR | Status: DC

## 2013-11-15 MED ORDER — VERAPAMIL HCL 2.5 MG/ML IV SOLN
INTRAVENOUS | Status: AC
  Filled 2013-11-15: qty 2

## 2013-11-15 NOTE — CV Procedure (Signed)
    Cardiac Catheterization Procedure Note  Name: [name] MRN: [id number] DOB: 03-06-1940  Procedure: Right Heart Cath, Left Heart Cath, Selective Coronary Angiography, LV angiography, aortic root angiography  Indication: Shortness of breath, aortic stenosis.    Procedural Details: There was an indwelling IV in a right antecubital vein. Using normal sterile technique, the IV was changed out for a 5 Fr brachial sheath over a 0.018 inch wire. The right wrist was then prepped, draped, and anesthetized with 1% lidocaine. Using the modified Seldinger technique a 5/6 French Slender sheath was placed in the right radial artery. It was very difficult to access the right radial artery. The artery was punctured multiple times but I was unable to thread a wire. Ultrasound guidance was utilized. I was finally able to pass a wire after multiple attempts and the sheath was advanced without difficulty. Intra-arterial verapamil was administered through the radial artery sheath. IV heparin was administered after a JR4 catheter was advanced into the central aorta. A Swan-Ganz catheter was used for the right heart catheterization. An angled glide wire had to be used to navigate the subclavian vein. Standard protocol was followed for recording of right heart pressures and sampling of oxygen saturations. Fick cardiac output was calculated. Standard Judkins catheters were used for selective coronary angiography and left ventriculography. There were no immediate procedural complications. The patient was transferred to the post catheterization recovery area for further monitoring.  Procedural Findings: Hemodynamics RA 14 RV 43/11 PA 38/20 with a mean of 27 PCWP A wave 18, V wave 15, mean 15 LV 138/24 AO 98/63 mean 80  Oxygen saturations: PA 64 AO 96  Cardiac Output (Fick) 6.5 L per minute  Cardiac Index (Fick) 2.8 L per minute per square meter   Aortic valve hemodynamics: Peak-to-peak gradient 40  mmHg Mean gradient 21 mmHg AVA 1.8 square cm, AVA index 0.8  Coronary angiography: Coronary dominance: right  Left mainstem: Patent without obstructive disease. There is mild calcification. The vessel arises from the left cusp it divides into the LAD and left circumflex.  Left anterior descending (LAD): Minor irregularity in the mid vessel. The LAD reaches the apex. The diagonal is medium in caliber and patent throughout. There is no high-grade obstruction throughout the LAD distribution.  Left circumflex (LCx): The circumflex is patent. The first obtuse marginal is medium in caliber without significant stenosis. The AV circumflex terminates in the distal AV groove without supplying any further OM branches.  Right coronary artery (RCA): There is a calcification at the ostium of the RCA without associated obstructive disease. The proximal, mid, and distal vessel are widely patent. The PDA and posterolateral branches are patent and supply a large coronary distribution.  Left ventriculography: Left ventricular systolic function is hyperdynamic, the LVEF is estimated at 70%. There is no significant mitral regurgitation.  Aortic root angiography: The proximal ascending aorta appears normal in caliber. There is no significant aortic insufficiency.  Estimated Blood Loss: Minimal  Final Conclusions:   1. 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 Moderate aortic stenosis 3. Elevated right and left heart filling pressures  Recommendations: Suspect dyspnea is multifactorial with a significant component related to her morbid obesity. I think her aortic stenosis can be followed for the time being. Will review with Dr. Wynonia Lawman.  [name] MD, [facility] 11/15/2013, 11:48 AM

## 2013-11-15 NOTE — H&P (View-Only) (Signed)
Patient ID: [name], female   DOB: 1940-03-14, 73 y.o.   MRN: [id number]   [name]    Date of visit:  10/19/2013 DOB:  1940/10/22    Age:  51 yrs. Medical record number:  [id number]     Account number:  [id number] Primary Care Provider: Claris Gower ____________________________ CURRENT DIAGNOSES  1. Dyspnea  2. Aortic Valve-Stenosis  3. Hypertensive Heart Disease-Benign without CHF  4. Diabetes Mellitus-NIDD  5. Obesity, morbid (BMI>40) ____________________________ ALLERGIES  Castor Oil, Syncope  Codeine, Vomiting  Sulfa (Sulfonamide Antibiotics), Intolerance-unknown ____________________________ MEDICATIONS  1. metformin 1,000 mg tablet, BID  2. enalapril maleate 10 mg tablet, 1 p.o. daily  3. vitamin B complex tablet, BID  4. Vitamin D3 5,000 unit tablet, 1 p.o. daily  5. astragalus root extract (bulk) 10:1 powder, 2  tabs tid  6. multivitamin tablet, 1 p.o. daily  7. vitamin E 400 unit capsule, BID  8. magnesium 250 mg tablet, 1 p.o. daily  9. herbal drugs tablet, tummeric q d  10. garlic tablet, PRN  11. glimepiride 4 mg tablet, 1.5 p.o. daily  12. furosemide 80 mg tablet, 1 p.o. daily ____________________________ CHIEF COMPLAINTS  Followup of Aortic Valve-Stenosis  Followup of Hypertensive Heart Disease-Benign without CHF ____________________________ HISTORY OF PRESENT ILLNESS This very nice 73 year old female is seen for evaluation of aortic stenosis. She has a prior history of diabetes mellitus and severe morbid obesity as well as hypertensive heart disease. For the past year she has had difficulty with progressive edema as well as worsening dyspnea on exertion. She does not have any chest pressure suggestive of angina. She does not have PND or orthopnea. On a recent visit to her endocrinologist she was found to have a systolic murmur and saw Dr. Arelia Sneddon who sent her for an echo. The echo was technically difficult but showed at least moderate aortic stenosis with a  mean gradient of around 28 mm. The patient has been taking furosemide on a daily basis but does note continuous edema and also has been drinking a lot of water. She has not had any syncope but does become quite winded when she tries to do activity. There is no history of rheumatic fever. She has a history of a retinal detachment but does not complain of any other complications from her diabetes. She does have a history of psoriasis.  Patient seen for cardiac followup on August 28 and  here she has been fluid restricting and has lost 5 pounds according to her primary doctor's scales but is up according to our scales. Her edema has gone down some. Blood pressure remains elevated here but was evidently normotensive at home and at Dr. Tretha Sciara office. As a result she did not take a higher dose of enalapril. Her BNP level was low when it was checked before. She is no longer having PND and her dyspnea is variable. She has no chest pain suggestive of angina. ____________________________ PAST HISTORY  Past Medical Illnesses:  morbid obesity, hypertension, DM-non-insulin dependent, hyperlipidemia, lumbar disc disease, psoriasis;  Cardiovascular Illnesses:  aortic stenosis;  Surgical Procedures:  appendectomy, cholecystectomy, hysterectomy, od surg, back surgery, breast biopsy, hemorrhoidectomy, tubal ligation;  Cardiology Procedures-Invasive:  no history of prior cardiac procedures;  Cardiology Procedures-Noninvasive:  echocardiogram July 2015;  LVEF of 60% documented via echocardiogram on 09/14/2013,   ____________________________ CARDIO-PULMONARY TEST DATES EKG Date:  10/05/2013;  Echocardiography Date: 09/14/2013;  Chest Xray Date: 10/05/2013;   ____________________________ FAMILY HISTORY Brother -- Brother alive and well Brother --  Brother alive with problem, Cancer Father -- Father dead, Medical history unknown Mother -- Mother dead, Pulmonary emphysema ____________________________ SOCIAL HISTORY Alcohol  Use:  no alcohol use;  Smoking:  used to smoke but quit 2003, 50 pack year history;  Diet:  vegetarian;  Lifestyle:  widowed and 3 sons;  Exercise:  no regular exercise;  Occupation:  retired and Conservation officer, [employer];  Residence:  lives with son;   ____________________________ REVIEW OF SYSTEMS General:  severe obesity, malaise and fatigue  Integumentary:psoriasis Eyes: wears eye glasses/contact lenses, retinal detachment Respiratory: dyspnea with exertion Cardiovascular:  please review HPI Abdominal: denies dyspepsia, GI bleeding, constipation, or diarrhea Genitourinary-Female: frequency, stress incontinence Musculoskeletal:  chronic low back pain, arthritis of the hips Neurological:  denies headaches, stroke, or TIA  ____________________________ PHYSICAL EXAMINATION VITAL SIGNS  Blood Pressure:  152/80 Sitting, Right arm, large cuff  , 160/82 Standing, Right arm and large cuff   Pulse:  78/min. Weight:  298.00 lbs. Height:  62" BMI: 54  Constitutional:  pleasant white female, in no acute distress, severely obese Skin:  psoriasis Head:  normocephalic, normal hair pattern, no masses or tenderness Eyes:  EOMS Intact, PERRLA, C and S clear, Funduscopic exam not done. ENT:  ears, nose and throat reveal no gross abnormalities.  Dentition good. Neck:  supple, without massess. No JVD, thyromegaly or carotid bruits. Carotid upstroke normal. Chest:  normal symmetry, clear to auscultation. Cardiac:  regular rhythm, normal S1, S2 diminished, no S3 or S4, grade 2/6 systolic murmur at aortic area radiating to neck Abdomen:  non-tender, severely obese Peripheral Pulses:  pedal pulses diffcult to feel Extremities & Back:  2+ edema Neurological:  no gross motor or sensory deficits noted, affect appropriate, oriented x3. ____________________________ IMPRESSIONS/PLAN 1. Aortic stenosis moderate to severe 2. Continued peripheral edema 3. Severe morbid obesity 4. Hypertensive heart disease still  elevated  Recommendations:  She is clinically better but her weight is unchanged and her blood pressure remains elevated at my office. Recommended that she increase her furosemide 80 mg twice daily. I spoke with Dr. [name] about doing a cardiac cath through the radial approach to further assess the aortic valve gradient in view of her morbid obesity in light of her clinical dyspnea and fluid retention. He will see her in short stay and the patient is agreeable to proceed.    ____________________________ TODAYS ORDERS  1. Return Visit: 1 month                       ____________________________ Cardiology Physician:  Kerry Hough MD [facility]

## 2013-11-15 NOTE — Discharge Instructions (Signed)
Radial Site Care °Refer to this sheet in the next few weeks. These instructions provide you with information on caring for yourself after your procedure. Your caregiver may also give you more specific instructions. Your treatment has been planned according to current medical practices, but problems sometimes occur. Call your caregiver if you have any problems or questions after your procedure. °HOME CARE INSTRUCTIONS °· You may shower the day after the procedure. Remove the bandage (dressing) and gently wash the site with plain soap and water. Gently pat the site dry. °· Do not apply powder or lotion to the site. °· Do not submerge the affected site in water for 3 to 5 days. °· Inspect the site at least twice daily. °· Do not flex or bend the affected arm for 24 hours. °· No lifting over 5 pounds (2.3 kg) for 5 days after your procedure. °· Do not drive home if you are discharged the same day of the procedure. Have someone else drive you. °· You may drive 24 hours after the procedure unless otherwise instructed by your caregiver. °· Do not operate machinery or power tools for 24 hours. °· A responsible adult should be with you for the first 24 hours after you arrive home. °What to expect: °· Any bruising will usually fade within 1 to 2 weeks. °· Blood that collects in the tissue (hematoma) may be painful to the touch. It should usually decrease in size and tenderness within 1 to 2 weeks. °SEEK IMMEDIATE MEDICAL CARE IF: °· You have unusual pain at the radial site. °· You have redness, warmth, swelling, or pain at the radial site. °· You have drainage (other than a small amount of blood on the dressing). °· You have chills. °· You have a fever or persistent symptoms for more than 72 hours. °· You have a fever and your symptoms suddenly get worse. °· Your arm becomes pale, cool, tingly, or numb. °· You have heavy bleeding from the site. Hold pressure on the site. °Document Released: 03/13/2010 Document Revised:  05/03/2011 Document Reviewed: 03/13/2010 °ExitCare® Patient Information ©2015 [employer]. This information is not intended to replace advice given to you by your health care provider. Make sure you discuss any questions you have with your health care provider. ° °

## 2013-11-15 NOTE — Interval H&P Note (Signed)
History and Physical Interval Note:  11/15/2013 9:35 AM  [name]  has presented today for surgery, with the diagnosis of aortic stenosis  The various methods of treatment have been discussed with the patient and family. After consideration of risks, benefits and other options for treatment, the patient has consented to  Procedure(s): LEFT AND RIGHT HEART CATHETERIZATION WITH CORONARY ANGIOGRAM (N/A) as a surgical intervention .  The patient's history has been reviewed, patient examined, no change in status, stable for surgery.  I have reviewed the patient's chart and labs.  Questions were answered to the patient's satisfaction.    Patient was personally interviewed and examined. I reviewed the risks, indications, and alternatives to right and left heart catheterization. The patient has aortic stenosis with associated exertional dyspnea. She will undergo full angiographic and hemodynamic assessment. She understands the risks, potential benefits, and alternatives to cardiac catheterization and possible PCI. There are no interval changes to add to the history above as recorded by [facility].  [name]

## 2013-12-05 ENCOUNTER — Encounter: Payer: Self-pay | Admitting: Gastroenterology

## 2014-01-02 ENCOUNTER — Other Ambulatory Visit (HOSPITAL_COMMUNITY): Payer: Self-pay | Admitting: Family Medicine

## 2014-01-02 DIAGNOSIS — Z1231 Encounter for screening mammogram for malignant neoplasm of breast: Secondary | ICD-10-CM

## 2014-01-25 ENCOUNTER — Ambulatory Visit (HOSPITAL_COMMUNITY): Payer: Medicare Other

## 2014-01-31 ENCOUNTER — Encounter (HOSPITAL_COMMUNITY): Payer: Self-pay | Admitting: Cardiovascular Disease

## 2014-04-16 ENCOUNTER — Other Ambulatory Visit: Payer: Self-pay | Admitting: Family Medicine

## 2014-04-16 DIAGNOSIS — Z1231 Encounter for screening mammogram for malignant neoplasm of breast: Secondary | ICD-10-CM

## 2014-04-26 ENCOUNTER — Ambulatory Visit
Admission: RE | Admit: 2014-04-26 | Discharge: 2014-04-26 | Disposition: A | Discharge status: Home / Self Care | Payer: PPO | Source: Ambulatory Visit | Attending: Family Medicine | Admitting: Family Medicine

## 2014-04-26 DIAGNOSIS — Z1231 Encounter for screening mammogram for malignant neoplasm of breast: Secondary | ICD-10-CM

## 2014-05-08 ENCOUNTER — Other Ambulatory Visit (HOSPITAL_COMMUNITY): Payer: Self-pay | Admitting: Family Medicine

## 2014-05-09 ENCOUNTER — Other Ambulatory Visit (HOSPITAL_COMMUNITY): Payer: Self-pay | Admitting: Family Medicine

## 2014-05-09 DIAGNOSIS — E2839 Other primary ovarian failure: Secondary | ICD-10-CM

## 2014-05-24 ENCOUNTER — Ambulatory Visit (HOSPITAL_COMMUNITY)
Admission: RE | Admit: 2014-05-24 | Discharge: 2014-05-24 | Disposition: A | Discharge status: Home / Self Care | Payer: PPO | Source: Ambulatory Visit | Attending: Family Medicine | Admitting: Family Medicine

## 2014-05-24 DIAGNOSIS — Z13820 Encounter for screening for osteoporosis: Secondary | ICD-10-CM | POA: Insufficient documentation

## 2014-05-24 DIAGNOSIS — E2839 Other primary ovarian failure: Secondary | ICD-10-CM

## 2014-05-24 DIAGNOSIS — Z780 Asymptomatic menopausal state: Secondary | ICD-10-CM | POA: Diagnosis not present

## 2014-09-06 ENCOUNTER — Other Ambulatory Visit: Payer: Self-pay | Admitting: Gastroenterology

## 2014-10-30 ENCOUNTER — Other Ambulatory Visit: Payer: Self-pay | Admitting: Family Medicine

## 2014-10-30 DIAGNOSIS — E041 Nontoxic single thyroid nodule: Secondary | ICD-10-CM

## 2014-11-21 ENCOUNTER — Ambulatory Visit
Admission: RE | Admit: 2014-11-21 | Discharge: 2014-11-21 | Disposition: A | Discharge status: Home / Self Care | Payer: PPO | Source: Ambulatory Visit | Attending: Family Medicine | Admitting: Family Medicine

## 2014-11-21 ENCOUNTER — Other Ambulatory Visit (HOSPITAL_COMMUNITY)
Admission: RE | Admit: 2014-11-21 | Discharge: 2014-11-21 | Disposition: A | Discharge status: Home / Self Care | Payer: PPO | Source: Ambulatory Visit | Attending: Family Medicine | Admitting: Family Medicine

## 2014-11-21 DIAGNOSIS — E041 Nontoxic single thyroid nodule: Secondary | ICD-10-CM | POA: Insufficient documentation

## 2015-02-23 HISTORY — PX: CATARACT EXTRACTION W/ INTRAOCULAR LENS IMPLANT: SHX1309

## 2015-12-22 ENCOUNTER — Other Ambulatory Visit: Payer: Self-pay | Admitting: Family Medicine

## 2015-12-22 DIAGNOSIS — R109 Unspecified abdominal pain: Secondary | ICD-10-CM

## 2015-12-22 DIAGNOSIS — R102 Pelvic and perineal pain: Secondary | ICD-10-CM

## 2015-12-24 ENCOUNTER — Inpatient Hospital Stay: Admission: RE | Admit: 2015-12-24 | Payer: PPO | Source: Ambulatory Visit

## 2015-12-24 ENCOUNTER — Ambulatory Visit
Admission: RE | Admit: 2015-12-24 | Discharge: 2015-12-24 | Disposition: A | Discharge status: Home / Self Care | Payer: Medicare Other | Source: Ambulatory Visit | Attending: Family Medicine | Admitting: Family Medicine

## 2015-12-24 DIAGNOSIS — R109 Unspecified abdominal pain: Secondary | ICD-10-CM

## 2015-12-24 MED ORDER — IOPAMIDOL (ISOVUE-300) INJECTION 61%
100.0000 mL | Freq: Once | INTRAVENOUS | Status: AC | PRN
  Administered 2015-12-24: 100 mL via INTRAVENOUS

## 2016-02-23 DIAGNOSIS — R899 Unspecified abnormal finding in specimens from other organs, systems and tissues: Secondary | ICD-10-CM

## 2016-02-23 HISTORY — DX: Unspecified abnormal finding in specimens from other organs, systems and tissues: R89.9

## 2016-02-23 HISTORY — PX: BIOPSY THYROID: PRO38

## 2016-12-05 ENCOUNTER — Encounter (HOSPITAL_COMMUNITY): Payer: Self-pay | Admitting: Emergency Medicine

## 2016-12-05 ENCOUNTER — Emergency Department (HOSPITAL_COMMUNITY)
Admission: EM | Admit: 2016-12-05 | Discharge: 2016-12-05 | Disposition: A | Discharge status: Home / Self Care | Payer: Medicare Other | Attending: Emergency Medicine | Admitting: Emergency Medicine

## 2016-12-05 ENCOUNTER — Emergency Department (HOSPITAL_COMMUNITY): Payer: Medicare Other

## 2016-12-05 DIAGNOSIS — Z7984 Long term (current) use of oral hypoglycemic drugs: Secondary | ICD-10-CM | POA: Diagnosis not present

## 2016-12-05 DIAGNOSIS — R0600 Dyspnea, unspecified: Secondary | ICD-10-CM

## 2016-12-05 DIAGNOSIS — I110 Hypertensive heart disease with heart failure: Secondary | ICD-10-CM | POA: Diagnosis not present

## 2016-12-05 DIAGNOSIS — Z79899 Other long term (current) drug therapy: Secondary | ICD-10-CM | POA: Diagnosis not present

## 2016-12-05 DIAGNOSIS — I509 Heart failure, unspecified: Secondary | ICD-10-CM | POA: Diagnosis not present

## 2016-12-05 DIAGNOSIS — R0602 Shortness of breath: Secondary | ICD-10-CM | POA: Diagnosis present

## 2016-12-05 DIAGNOSIS — Z87891 Personal history of nicotine dependence: Secondary | ICD-10-CM | POA: Insufficient documentation

## 2016-12-05 LAB — BASIC METABOLIC PANEL
Anion gap: 7 (ref 5–15)
BUN: 11 mg/dL (ref 6–20)
CHLORIDE: 104 mmol/L (ref 101–111)
CO2: 25 mmol/L (ref 22–32)
Calcium: 8.6 mg/dL — ABNORMAL LOW (ref 8.9–10.3)
Creatinine, Ser: 0.89 mg/dL (ref 0.44–1.00)
GFR calc Af Amer: >60 mL/min (ref >60)
GFR calc non Af Amer: >60 mL/min (ref >60)
Glucose, Bld: 147 mg/dL — ABNORMAL HIGH (ref 65–99)
POTASSIUM: 4.5 mmol/L (ref 3.5–5.1)
SODIUM: 136 mmol/L (ref 135–145)

## 2016-12-05 LAB — CBC WITH DIFFERENTIAL/PLATELET
Basophils Absolute: 0 10*3/uL (ref 0.0–0.1)
Basophils Relative: 0 %
EOS ABS: 0.2 10*3/uL (ref 0.0–0.7)
Eosinophils Relative: 2 %
HEMATOCRIT: 34 % — AB (ref 36.0–46.0)
HEMOGLOBIN: 10.7 g/dL — AB (ref 12.0–15.0)
LYMPHS ABS: 2.3 10*3/uL (ref 0.7–4.0)
LYMPHS PCT: 22 %
MCH: 28.7 pg (ref 26.0–34.0)
MCHC: 31.5 g/dL (ref 30.0–36.0)
MCV: 91.2 fL (ref 78.0–100.0)
Monocytes Absolute: 0.7 10*3/uL (ref 0.1–1.0)
Monocytes Relative: 7 %
NEUTROS ABS: 7.4 10*3/uL (ref 1.7–7.7)
NEUTROS PCT: 69 %
Platelets: 178 10*3/uL (ref 150–400)
RBC: 3.73 MIL/uL — AB (ref 3.87–5.11)
RDW: 16.3 % — ABNORMAL HIGH (ref 11.5–15.5)
WBC: 10.7 10*3/uL — ABNORMAL HIGH (ref 4.0–10.5)

## 2016-12-05 LAB — D-DIMER, QUANTITATIVE (NOT AT ARMC): D DIMER QUANT: 2.12 ug{FEU}/mL — AB (ref 0.00–0.50)

## 2016-12-05 LAB — I-STAT TROPONIN, ED
TROPONIN I, POC: 0.02 ng/mL (ref 0.00–0.08)
TROPONIN I, POC: 0.02 ng/mL (ref 0.00–0.08)

## 2016-12-05 LAB — BRAIN NATRIURETIC PEPTIDE: B NATRIURETIC PEPTIDE 5: 328.3 pg/mL — AB (ref 0.0–100.0)

## 2016-12-05 MED ORDER — FUROSEMIDE 10 MG/ML IJ SOLN
80.0000 mg | Freq: Once | INTRAMUSCULAR | Status: AC
  Administered 2016-12-05: 80 mg via INTRAVENOUS
  Filled 2016-12-05: qty 8

## 2016-12-05 MED ORDER — IOPAMIDOL (ISOVUE-370) INJECTION 76%
INTRAVENOUS | Status: AC
  Administered 2016-12-05: 80 mL
  Filled 2016-12-05: qty 100

## 2016-12-05 NOTE — ED Provider Notes (Signed)
[facility] DEPT Provider Note   CSN: [id number] Arrival date & time: 12/05/16  1025     History   Chief Complaint Chief Complaint  Patient presents with  . Shortness of Breath    HPI [name] is a 76 y.o. female.  Patient with h/o aortic stenosis, mild disease on cath in 2015 -- presents with c/o acute onset of shortness of breath occurring after waking from sleep at approximately 4 AM today. Patient states that she did not feel like she couldn't get air in. She did not have any associated chest pains, lightheadedness, palpitations. She denies any recent fevers or cough. Patient is on 80 mg Lasix twice a day but denies any worsening lower extremity swelling -- however she admits to not taking her Lasix for the past 3 days since her injury. She states she cannot get up to go to the bathroom and has been using pads at home. Patient has generalized soreness in her body including legs and back stemming from a fall 3 days ago where she broke her right ankle. This is currently in a splint. Patient lives at home by herself but states that her son comes and helps her several times a day. She admits to not being ambulatory over the past several days due to her fracture. No treatments prior to arrival other than oxygen applied due to a low pulse oximetry reading from EMS.no history of blood clots.      Past Medical History:  Diagnosis Date  . Diarrhea   . Diverticulosis of colon (without mention of hemorrhage)   . Esophageal reflux   . External hemorrhoids without mention of complication   . Fatty liver disease, nonalcoholic   . Fibromyalgia   . Hyperlipemia   . Hypertension   . IBS (irritable bowel syndrome)   . Obesity, unspecified   . Personal history of colonic polyps    adenomatous & TUBULAR ADENOMA  . Psoriasis     Patient Active Problem List   Diagnosis Date Noted  . 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 DYSPHAGIA UNSPECIFIED 08/22/2007  .  EXOGENOUS OBESITY 07/18/2007  . GERD 07/18/2007  . FATTY LIVER DISEASE 07/18/2007  . DYSPHAGIA 07/18/2007  . DIARRHEA, CHRONIC 07/18/2007  . COLONIC POLYPS, ADENOMATOUS 07/27/2004  . EXTERNAL HEMORRHOIDS 07/27/2004  . DIVERTICULOSIS, MILD 07/27/2004    Past Surgical History:  Procedure Laterality Date  . APPENDECTOMY    . BACK SURGERY    . CHOLECYSTECTOMY    . LEFT AND RIGHT HEART CATHETERIZATION WITH CORONARY ANGIOGRAM N/A 11/15/2013   Procedure: LEFT AND RIGHT HEART CATHETERIZATION WITH CORONARY ANGIOGRAM;  Surgeon: Blane Ohara, MD;  Location: [facility];  Service: Cardiovascular;  Laterality: N/A;  . VAGINAL HYSTERECTOMY      OB History    No data available       Home Medications    Prior to Admission medications   Medication Sig Start Date End Date Taking? Authorizing Provider  Aspirin-Salicylamide-Caffeine (BC HEADACHE POWDER PO) Take 1 packet by mouth daily as needed (back pain).    [provider]  ASTRAGALUS PO Take 470 mg by mouth daily.    [provider]  B Complex-Biotin-FA (B-COMPLEX PO) Take 1-2 capsules by mouth daily.    [provider]  cholecalciferol (VITAMIN D) 1000 UNITS tablet Take 4,000-5,000 Units by mouth daily. Adjust based on food intake    [provider]  enalapril (VASOTEC) 10 MG tablet Take 10 mg by mouth  daily.    [provider]  furosemide (LASIX) 80 MG tablet Take 80 mg by mouth 2 (two) times daily.    [provider]  glimepiride (AMARYL) 4 MG tablet Take 6 mg by mouth daily with breakfast.    [provider]  metFORMIN (GLUMETZA) 1000 MG (MOD) 24 hr tablet Take 1,000 mg by mouth 2 (two) times daily with a meal.     [provider]  Omega-3 Fatty Acids (OMEGA-3 FISH OIL PO) Take 1-2 capsules by mouth daily.    [provider]  vitamin E 400 UNIT capsule Take 800 Units by mouth daily.    [provider]    Family History Family History  Problem  Relation Age of Onset  . Lung cancer Mother   . Diabetes Maternal Grandmother   . Heart disease Maternal Grandfather   . Colon cancer Neg Hx     Social History Social History  Substance Use Topics  . Smoking status: Former Research scientist (life sciences)  . Smokeless tobacco: Never Used  . Alcohol use No     Allergies   Sulfonamide derivatives; Amoxicillin; Castor oil; Demerol [meperidine]; and Hydrocodone   Review of Systems Review of Systems  Constitutional: Negative for diaphoresis and fever.  Eyes: Negative for redness.  Respiratory: Positive for shortness of breath. Negative for cough.   Cardiovascular: Negative for chest pain, palpitations and leg swelling.  Gastrointestinal: Negative for abdominal pain, nausea and vomiting.  Genitourinary: Negative for dysuria.  Musculoskeletal: Positive for myalgias. Negative for back pain and neck pain.  Skin: Negative for rash.  Neurological: Negative for syncope and light-headedness.  Psychiatric/Behavioral: The patient is not nervous/anxious.      Physical Exam Updated Vital Signs BP 117/86 (BP Location: Left Wrist)   Pulse 95   Temp 98.1 F (36.7 C) (Oral)   Resp 20   SpO2 100%   Physical Exam  Constitutional: She appears well-developed and well-nourished.  HENT:  Head: Normocephalic and atraumatic.  Mouth/Throat: Oropharynx is clear and moist and mucous membranes are normal. Mucous membranes are not dry.  Eyes: Conjunctivae are normal.  Neck: Trachea normal and normal range of motion. Neck supple. Normal carotid pulses and no JVD present. No muscular tenderness present. Carotid bruit is not present. No tracheal deviation present.  Cardiovascular: Normal rate, regular rhythm, S1 normal, S2 normal and intact distal pulses.  Exam reveals no decreased pulses.   Murmur (3/6 systolic, LSB) heard. Pulmonary/Chest: Effort normal. No respiratory distress. She has no wheezes. She exhibits no tenderness.  Abdominal: Soft. Normal aorta and bowel sounds  are normal. There is no tenderness. There is no rebound and no guarding.  Musculoskeletal: Normal range of motion.  Patient with fiberglass short leg splint in place on the right leg. Patient has generalized tenderness in her right thigh as well as her entire left leg she states from being sore after falling. She has trace to 1+ pitting edema of the left lower extremity.  Neurological: She is alert.  Skin: Skin is warm and dry. She is not diaphoretic. No cyanosis. No pallor.  Psychiatric: She has a normal mood and affect.  Nursing note and vitals reviewed.    ED Treatments / Results  Labs (all labs ordered are listed, but only abnormal results are displayed) Labs Reviewed  CBC WITH DIFFERENTIAL/PLATELET - Abnormal; Notable for the following:       Result Value   WBC 10.7 (*)    RBC 3.73 (*)    Hemoglobin 10.7 (*)  HCT 34.0 (*)    RDW 16.3 (*)    All other components within normal limits  BASIC METABOLIC PANEL - Abnormal; Notable for the following:    Glucose, Bld 147 (*)    Calcium 8.6 (*)    All other components within normal limits  BRAIN NATRIURETIC PEPTIDE - Abnormal; Notable for the following:    B Natriuretic Peptide 328.3 (*)    All other components within normal limits  D-DIMER, QUANTITATIVE (NOT AT [facility]) - Abnormal; Notable for the following:    D-Dimer, Quant 2.12 (*)    All other components within normal limits  I-STAT TROPONIN, ED  I-STAT TROPONIN, ED    EKG  EKG Interpretation  Date/Time:  Sunday December 05 2016 06:02:40 EDT Ventricular Rate:  94 PR Interval:  172 QRS Duration: 112 QT Interval:  392 QTC Calculation: 490 R Axis:   -46 Text Interpretation:  Normal sinus rhythm Left anterior fascicular block Septal infarct , age undetermined Abnormal ECG No significant change since last tracing Confirmed by Pryor Curia [phone]) on 12/05/2016 6:12:44 AM       Radiology Dg Chest 2 View  Result Date: 12/05/2016 CLINICAL DATA:  Acute onset of shortness  of breath. Initial encounter. EXAM: CHEST  2 VIEW COMPARISON:  Chest radiograph performed 10/05/2013 FINDINGS: The lungs are well-aerated. Vascular congestion is noted. Mild bibasilar opacities likely reflect interstitial edema. No pleural effusion or pneumothorax is seen. The heart is mildly enlarged. No acute osseous abnormalities are seen. IMPRESSION: Vascular congestion and mild cardiomegaly. Mild bibasilar airspace opacities likely reflect interstitial edema. Electronically Signed   By: Garald Balding M.D.   On: 12/05/2016 06:37   Ct Angio Chest Pe W And/or Wo Contrast  Result Date: 12/05/2016 CLINICAL DATA:  Shortness of breath. EXAM: CT ANGIOGRAPHY CHEST WITH CONTRAST TECHNIQUE: Multidetector CT imaging of the chest was performed using the standard protocol during bolus administration of intravenous contrast. Multiplanar CT image reconstructions and MIPs were obtained to evaluate the vascular anatomy. CONTRAST:  80 mL of Isovue 370 intravenously. COMPARISON:  Radiographs of December 05, 2016. CT scan of December 24, 2015 and May 21, 2010. FINDINGS: Cardiovascular: Satisfactory opacification of the pulmonary arteries to the segmental level. No evidence of pulmonary embolism. Mild cardiomegaly is noted. Aortic and mitral valve calcifications are noted. No pericardial effusion. Atherosclerosis of thoracic aorta is noted without aneurysm or dissection. Mediastinum/Nodes: Enlarged right thyroid gland is noted. The esophagus is unremarkable. No significant adenopathy is noted. Lungs/Pleura: No pneumothorax or pleural effusion is noted. 10 mm rounded density is noted in the left lower lobe best seen on image number 111 of series 6 which is stable compared to prior CT scans, and can be considered benign at this point. Patchy bilateral upper lobe opacity is noted concerning for possible inflammation. Upper Abdomen: No acute abnormality. Musculoskeletal: No chest wall abnormality. No acute or significant osseous  findings. Review of the MIP images confirms the above findings. IMPRESSION: No definite evidence of pulmonary embolus. Enlarged right thyroid gland is noted. Thyroid ultrasound is recommended evaluate for possible large nodule or mass. Patchy bilateral upper lobe opacities are noted concerning for possible inflammation. Aortic Atherosclerosis (ICD10-I70.0). Electronically Signed   By: Marijo Conception, M.D.   On: 12/05/2016 08:59    Procedures Procedures (including critical care time)  Medications Ordered in ED Medications  iopamidol (ISOVUE-370) 76 % injection (80 mLs  Contrast Given 12/05/16 0815)  furosemide (LASIX) injection 80 mg (80 mg Intravenous Given 12/05/16 0922)  Initial Impression / Assessment and Plan / ED Course  I have reviewed the triage vital signs and the nursing notes.  Pertinent labs & imaging results that were available during my care of the patient were reviewed by me and considered in my medical decision making (see chart for details).     Patient seen and examined. Work-up initiated.    Vital signs reviewed and are as follows: BP 117/86 (BP Location: Left Wrist)   Pulse 95   Temp 98.1 F (36.7 C) (Oral)   Resp 20   SpO2 100%   Discussed with Dr. Leonides Schanz who will see.   Patient updated on elevated d-dimer and need for CT of the chest.   CT of the chest was reviewed by myself. No reported PE. Patient was given 80 mg IV Lasix. Will monitor.  11:13 AM repeat troponin and EKG are unchanged. Patient has had good urinary output with IV Lasix. She states "I can already tell a difference".   I have spoken with case manager who will see patient in regards to obtaining any kind of home health assistance for the patient given her decreased mobility, comorbidities. Feel that she is high risk for repeat visit to the emergency department if we do not address these issues today.  Discussed results with patient, anticipate discharge to home after completion of  evaluation by case manager.  11:37 AM Case manager has seen patient and is assisting in obtaining home health for the patient.  Due to the patient's ankle fracture, patient is unable to use a standard walker and will require a wheelchair for mobility in order to take her medications and manage her chronic medical conditions.   12:18 PM Arrangements made, pt to be discharged.   Final Clinical Impressions(s) / ED Diagnoses   Final diagnoses:  Acute congestive heart failure, unspecified heart failure type (HCC)  Paroxysmal nocturnal dyspnea   Patient with history of aortic stenosis, on Lasix to control pulmonary congestion -- presents with acute episode of shortness of breath while sleeping. Patient with nonischemic EKG, troponin negative 2 today. D-dimer was positive prompting CT which did not demonstrate any pulmonary emboli. Patient with mildly elevated BNP. She was diuresed in the emergency department with good urinary output. Normal kidney function. Patient's management of her chronic medical conditions has been difficult due to her recent ankle fracture and poor mobility. This was addressed through the case manager who is arranged for home health services and also for the patient to have a wheelchair. She will follow-up with orthopedic surgery regarding her fracture. She is not hypoxic and is feeling better after treatment in emergency department. No indications for admission at this time.   New Prescriptions New Prescriptions   No medications on file     Carlisle Cater, Hershal Coria 12/05/16 [place], Delice Bison, DO 12/06/16 [id number]

## 2016-12-05 NOTE — ED Provider Notes (Signed)
Medical screening examination/treatment/procedure(s) were conducted as a shared visit with non-physician practitioner(s) and myself.  I personally evaluated the patient during the encounter.   EKG Interpretation  Date/Time:  Sunday December 05 2016 06:02:40 EDT Ventricular Rate:  94 PR Interval:  172 QRS Duration: 112 QT Interval:  392 QTC Calculation: 490 R Axis:   -46 Text Interpretation:  Normal sinus rhythm Left anterior fascicular block Septal infarct , age undetermined Abnormal ECG No significant change since last tracing Confirmed by Pryor Curia [phone]) on 12/05/2016 6:12:44 AM      Patient is a 76 year old female with history of obesity, hypertension, hyperlipidemia, aortic stenosis, mild nonobstructive CAD who presents emergency department shortness of breath that started when she woke up from sleep this morning. No chest pain or chest discomfort. No fever, cough. Did recently fracture her right ankle on Thursday, October 5 and is in a splint. No history of PE or DVT. Does not wear oxygen at home. Oxygen saturation with EMS was 87%. On exam, patient is speaking full sentences. Lungs are clear with good aeration.  Chest x-ray shows mild pulmonary edema. She is are you on Lasix at home. Given IV dose and the emergency department and she has diuresed well and is feeling better. No hypoxia here. CT scan shows no pulmonary embolus.Troponin negative.  Patient discharged in stable condition.   Kyal Arts, Delice Bison, DO 12/06/16 [phone]

## 2016-12-05 NOTE — Care Management Note (Signed)
Case Management Note  Patient Details  Name: SKIE VITRANO MRN: [id number] Date of Birth: 1940-10-13  Subjective/Objective:   76 y.o. F seen in the ED following a period of SOB. Pt had FX ankle 3 d ago and is NWB. Tells me she does have a RW which she can use . CHosen [facility] to provide HHPT and OT during her rehabilitation, so she can continue to take lasix and not omit her doses, as  She has done over the past few days 2/2 her immobility                 Action/Plan: [facility] aware of referral and need for DME   Expected Discharge Date:                  Expected Discharge Plan:     In-House Referral:     Discharge planning Services  CM Consult  Post Acute Care Choice:  Durable Medical Equipment, Home Health Choice offered to:  Patient  DME Arranged:  Wheelchair manual DME Agency:  [place]:  PT, OT [facility] Agency:  [place]  Status of Service:  Completed, signed off  If discussed at [place] of Stay Meetings, dates discussed:    Additional Comments:  Delrae Sawyers, RN 12/05/2016, 11:41 AM

## 2016-12-05 NOTE — ED Triage Notes (Signed)
Pt BIB [facility] for shortness of breath that started around 0430. Pt denies history of lung disease. Per EMS lungs were clear and did not sound diminished. Was sating 87% on RA. During triage patient was sating 100% on RA. Pt did break her ankle this past Thursday 10/18 and was evaluated at [facility]. Denies any pain outside of her chronic back pain

## 2016-12-05 NOTE — Discharge Instructions (Signed)
Please read and follow all provided instructions.  Your diagnoses today include:  1. Acute congestive heart failure, unspecified heart failure type (HCC)   2. Paroxysmal nocturnal dyspnea     Tests performed today include:  An EKG of your heart  A chest x-ray - shows some fluid build-up  CT of chest - no signs of blood clot  Cardiac enzymes - a blood test for heart muscle damage, no sign of heart attack  Blood counts and electrolytes  Vital signs. See below for your results today.   Medications prescribed:   None  Take any prescribed medications only as directed.  Follow-up instructions: Please follow-up with your primary care provider as soon as you can for further evaluation of your symptoms.   Return instructions:  SEEK IMMEDIATE MEDICAL ATTENTION IF:  You have severe chest pain, especially if the pain is crushing or pressure-like and spreads to the arms, back, neck, or jaw, or if you have sweating, nausea (feeling sick to your stomach), or shortness of breath. THIS IS AN EMERGENCY. Don't wait to see if the pain will go away. Get medical help at once. Call 911 or 0 (operator). DO NOT drive yourself to the hospital.   Your chest pain gets worse and does not go away with rest.   You wake from sleep with chest pain or have worsening shortness of breath.  You feel dizzy or faint.  You have chest pain not typical of your usual pain for which you originally saw your caregiver.   You have any other emergent concerns regarding your health.  Your vital signs today were: BP 98/83 (BP Location: Left Arm)    Pulse 88    Temp 98.1 F (36.7 C) (Oral)    Resp 20    SpO2 100%  If your blood pressure (BP) was elevated above 135/85 this visit, please have this repeated by your doctor within one month. --------------

## 2016-12-05 NOTE — ED Provider Notes (Signed)
11:39 AM Due to the patient's ankle fracture, patient is unable to use a standard walker and will require a wheelchair for mobility in order to take her medications and manage her chronic medical conditions.   Case manager is assisting in obtaining this and home health services for patient to help prevent further decompensation of chronic medical conditions.    Carlisle Cater, PA-C 12/05/16 [place], Wenda Overland, MD 12/06/16 [phone]

## 2017-05-10 ENCOUNTER — Other Ambulatory Visit: Payer: Self-pay | Admitting: Otolaryngology

## 2017-05-10 DIAGNOSIS — E041 Nontoxic single thyroid nodule: Secondary | ICD-10-CM

## 2017-05-16 ENCOUNTER — Ambulatory Visit
Admission: RE | Admit: 2017-05-16 | Discharge: 2017-05-16 | Disposition: A | Discharge status: Home / Self Care | Payer: Medicare Other | Source: Ambulatory Visit | Attending: Otolaryngology | Admitting: Otolaryngology

## 2017-05-16 DIAGNOSIS — E041 Nontoxic single thyroid nodule: Secondary | ICD-10-CM

## 2017-10-03 ENCOUNTER — Encounter: Payer: Self-pay | Admitting: Cardiology
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
## 2017-10-11 ENCOUNTER — Encounter: Payer: Self-pay | Admitting: Cardiology

## 2017-10-12 ENCOUNTER — Institutional Professional Consult (permissible substitution): Payer: Medicare Other | Admitting: Cardiovascular Disease

## 2017-10-26 ENCOUNTER — Ambulatory Visit: Payer: Medicare Other | Admitting: Cardiovascular Disease

## 2017-10-26 ENCOUNTER — Encounter: Payer: Self-pay | Admitting: Cardiovascular Disease

## 2017-10-26 VITALS — BP 112/70 | HR 80 | Ht 62.0 in | Wt 310.4 lb

## 2017-10-26 DIAGNOSIS — I350 Nonrheumatic aortic (valve) stenosis: Secondary | ICD-10-CM
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
## 2017-10-26 NOTE — Patient Instructions (Addendum)
Medication Instructions:  Your provider recommends that you continue on your current medications as directed. Please refer to the Current Medication list given to you today.    Labwork: TODAY: BMET, CBC  Testing/Procedures: Your physician has requested that you have a cardiac catheterization. Cardiac catheterization is used to diagnose and/or treat various heart conditions. Doctors may recommend this procedure for a number of different reasons. The most common reason is to evaluate chest pain. Chest pain can be a symptom of coronary artery disease (CAD), and cardiac catheterization can show whether plaque is narrowing or blocking your heart's arteries. This procedure is also used to evaluate the valves, as well as measure the blood flow and oxygen levels in different parts of your heart. For further information please visit [email or web address]. Please follow instruction sheet, as given.  Follow-Up: [name], the TAVR nurse, will contact you to arrange further testing.  Any Other Special Instructions Will Be Listed Below (If Applicable).    [facility] [place], [place] [address] Dept: [phone] Loc: [phone]  [name]  10/26/2017  You are scheduled for a Cardiac Catheterization on Wednesday, September 11 with Dr. Lauree Chandler.  1. Please arrive at the [facility] (Main Entrance A) at [facility]: [address] [place], [place] [address] at 8:00 AM (This time is two hours before your procedure to ensure your preparation). Free valet parking service is available.   Special note: Every effort is made to have your procedure done on time. Please understand that emergencies sometimes delay scheduled procedures.  2. Diet: Do not eat solid foods after midnight.  The patient may have clear liquids until 5am upon the day of the procedure.  3. Labs: Today!  4.  Medication instructions in preparation for your procedure:  1) HOLD METFORMIN the morning of your test. You my resume 48 hours after your procedure.  2) HOLD AMARYL the morning of your test.  3) HOLD LASIX the morning of your test.  4) TAKE ASPIRIN 81 mg the morning of your catheterization.  5) take your other medications as directed with sips of water (except meds listed above)   5. Plan for one night stay--bring personal belongings. 6. Bring a current list of your medications and current insurance cards. 7. You MUST have a responsible person to drive you home. 8. Someone MUST be with you the first 24 hours after you arrive home or your discharge will be delayed. 9. Please wear clothes that are easy to get on and off and wear slip-on shoes.  Thank you for allowing [place] to care for you!   -- [facility]

## 2017-10-26 NOTE — H&P (View-Only) (Signed)
Cardiology Office Note Date:  10/30/2017   ID:  [name], DOB 1940-11-28, MRN [id number]  PCP:  Cheryl Downing, MD  Cardiologist:  Cheryl Eth, MD  Chief Complaint  Patient presents with  . Shortness of Breath     History of Present Illness: [name] is a 77 y.o. female who presents for evaluation of severe aortic stenosis.   She is here with her granddaughter today.  She was previously evaluated about 4 years ago with a right and left heart catheterization for aortic stenosis and recommendations at that time were made for ongoing surveillance and medical therapy.  The patient has subsequently developed progressive symptoms over the past year, and now presents with echo findings clearly consistent with severe aortic stenosis.  She has had 2 episodes of syncope in the past 3 months. One occurred when she was driving, with prodromal symptoms of dizziness and weakness. She was able to pull off the road but did lose consciousness.  The other episode occurred when she had stood up and walked to the kitchen. She collapsed to the ground and injured her knees.   She also complains of leg swelling and increased diuretic requirements, now taking furosemide 80 mg TID. She has marked exertional dyspnea over the past year. No chest pain or pressure. No orthopnea or PND. She had a recent echocardiogram at Dr Thurman Coyer office demonstrating findings of severe aortic stenosis with peak transaortic velocity of 4.12 meters/second.   The patient lives in [place], [place].  She has been widowed for many years.  She does have some family that lives nearby and she is accompanied by her granddaughter today.  1 of her sons died last year, but another son lives nearby and is supportive.    Past Medical History:  Diagnosis Date  . Diarrhea   . Diverticulosis of colon (without mention of hemorrhage)   . Esophageal reflux   . External hemorrhoids without mention of complication   .  Fatty liver disease, nonalcoholic   . Fibromyalgia   . Hyperlipemia   . Hypertension   . IBS (irritable bowel syndrome)   . Obesity, unspecified   . Personal history of colonic polyps    adenomatous & TUBULAR ADENOMA  . Psoriasis     Past Surgical History:  Procedure Laterality Date  . APPENDECTOMY    . BACK SURGERY    . CHOLECYSTECTOMY    . LEFT AND RIGHT HEART CATHETERIZATION WITH CORONARY ANGIOGRAM N/A 11/15/2013   Procedure: LEFT AND RIGHT HEART CATHETERIZATION WITH CORONARY ANGIOGRAM;  Surgeon: Blane Ohara, MD;  Location: [facility] CATH LAB;  Service: Cardiovascular;  Laterality: N/A;  . VAGINAL HYSTERECTOMY      Current Outpatient Medications  Medication Sig Dispense Refill  . Aspirin-Salicylamide-Caffeine (BC HEADACHE POWDER PO) Take 1 packet by mouth daily as needed (back pain).    Marland Kitchen atorvastatin (LIPITOR) 40 MG tablet Take 40 mg by mouth daily.    . cholecalciferol (VITAMIN D) 1000 UNITS tablet Take 1,000 Units by mouth daily.     . cyclobenzaprine (FLEXERIL) 10 MG tablet Take 10 mg by mouth 3 (three) times daily as needed for muscle spasms.    . enalapril (VASOTEC) 10 MG tablet Take 10 mg by mouth daily.    . furosemide (LASIX) 80 MG tablet Take 80 mg by mouth 2 (two) times daily as needed for fluid.     Marland Kitchen glimepiride (AMARYL) 4 MG tablet Take 6 mg by mouth daily with breakfast.    .  metFORMIN (GLUMETZA) 1000 MG (MOD) 24 hr tablet Take 1,000 mg by mouth 2 (two) times daily with a meal.     . Multiple Vitamin (MULTIVITAMIN WITH MINERALS) TABS tablet Take 1 tablet by mouth daily.    Marland Kitchen POTASSIUM PO Take 1 tablet by mouth daily as needed (when taking a fluid pill).    . traMADol (ULTRAM) 50 MG tablet Take 50-100 mg by mouth every 6 (six) hours as needed for moderate pain.    . vitamin E 400 UNIT capsule Take 800 Units by mouth daily.     No current facility-administered medications for this visit.     Allergies:   Sulfonamide derivatives; Amoxicillin; Castor oil; Demerol  [meperidine]; and Hydrocodone   Social History:  The patient  reports that she has quit smoking. She has never used smokeless tobacco. She reports that she does not drink alcohol or use drugs.   Family History:  The patient's family history includes Diabetes in her maternal grandmother; Heart disease in her maternal grandfather; Lung cancer in her mother.    ROS:  Please see the history of present illness.  Otherwise, review of systems is positive for leg swelling, hearing loss, diarrhea, depression, back pain, muscle pain, rash, fatigue, anxiety.  All other systems are reviewed and negative.    PHYSICAL EXAM: VS:  BP 112/70   Pulse 80   Ht 5\' 2"  (1.575 m)   Wt (!) 310 lb 6.4 oz (140.8 kg)   BMI 56.77 kg/m  , BMI Body mass index is 56.77 kg/m. GEN: Pleasant, morbidly obese woman, in no acute distress  HEENT: normal  Neck: no JVD, no masses. bilateral carotid bruits Cardiac: RRR with harsh 4/6 systolic murmur at the RUSB      Respiratory:  clear to auscultation bilaterally, normal work of breathing GI: soft, nontender, nondistended, + BS MS: no deformity or atrophy  Ext: 3+ bilateral pretibial edema Skin: warm and dry, no rash Neuro:  Strength and sensation are intact Psych: euthymic mood, full affect  EKG:  EKG is ordered today. The ekg ordered today shows NSR 80 bpm, incomplete LBBB, HR 80 bpm  Recent Labs: 12/05/2016: B Natriuretic Peptide 328.3 10/26/2017: BUN 15; Creatinine, Ser 0.97; Hemoglobin 11.7; Platelets 175; Potassium 4.1; Sodium 142   Lipid Panel  No results found for: CHOL, TRIG, HDL, CHOLHDL, VLDL, LDLCALC, LDLDIRECT    Wt Readings from Last 3 Encounters:  10/26/17 (!) 310 lb 6.4 oz (140.8 kg)  11/15/13 299 lb (135.6 kg)     Cardiac Studies Reviewed: Cardiac catheterization 11/15/2013: Name: [name] MRN: [id number] DOB: [birth year]  Procedure: Right Heart Cath, Left Heart Cath, Selective Coronary Angiography, LV angiography, aortic root  angiography  Indication: Shortness of breath, aortic stenosis.          Procedural Details: There was an indwelling IV in a right antecubital vein. Using normal sterile technique, the IV was changed out for a 5 Fr brachial sheath over a 0.018 inch wire. The right wrist was then prepped, draped, and anesthetized with 1% lidocaine. Using the modified Seldinger technique a 5/6 French Slender sheath was placed in the right radial artery. It was very difficult to access the right radial artery. The artery was punctured multiple times but I was unable to thread a wire. Ultrasound guidance was utilized. I was finally able to pass a wire after multiple attempts and the sheath was advanced without difficulty. Intra-arterial verapamil was administered through the radial artery sheath. IV heparin was administered after  a JR4 catheter was advanced into the central aorta. A Swan-Ganz catheter was used for the right heart catheterization. An angled glide wire had to be used to navigate the subclavian vein. Standard protocol was followed for recording of right heart pressures and sampling of oxygen saturations. Fick cardiac output was calculated. Standard Judkins catheters were used for selective coronary angiography and left ventriculography. There were no immediate procedural complications. The patient was transferred to the post catheterization recovery area for further monitoring.  Procedural Findings: Hemodynamics RA 14 RV 43/11 PA 38/20 with a mean of 27 PCWP A wave 18, V wave 15, mean 15 LV 138/24 AO 98/63 mean 80  Oxygen saturations: PA 64 AO 96  Cardiac Output (Fick) 6.5 L per minute  Cardiac Index (Fick) 2.8 L per minute per square meter         Aortic valve hemodynamics: Peak-to-peak gradient 40 mmHg Mean gradient 21 mmHg AVA 1.8 square cm, AVA index 0.8  Coronary angiography: Coronary dominance: right  Left mainstem: Patent without obstructive disease. There is mild calcification.  The vessel arises from the left cusp it divides into the LAD and left circumflex.  Left anterior descending (LAD): Minor irregularity in the mid vessel. The LAD reaches the apex. The diagonal is medium in caliber and patent throughout. There is no high-grade obstruction throughout the LAD distribution.  Left circumflex (LCx): The circumflex is patent. The first obtuse marginal is medium in caliber without significant stenosis. The AV circumflex terminates in the distal AV groove without supplying any further OM branches.  Right coronary artery (RCA): There is a calcification at the ostium of the RCA without associated obstructive disease. The proximal, mid, and distal vessel are widely patent. The PDA and posterolateral branches are patent and supply a large coronary distribution.  Left ventriculography: Left ventricular systolic function is hyperdynamic, the LVEF is estimated at 70%. There is no significant mitral regurgitation.  Aortic root angiography: The proximal ascending aorta appears normal in caliber. There is no significant aortic insufficiency.  Estimated Blood Loss: Minimal  Final Conclusions:   1. 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 Moderate aortic stenosis 3. Elevated right and left heart filling pressures  Recommendations: Suspect dyspnea is multifactorial with a significant component related to her morbid obesity. I think her aortic stenosis can be followed for the time being. Will review with Dr. Wynonia Lawman.  [name] MD, [facility] 11/15/2013, 11:48 AM  STS Risk Calculator: Procedure: Isolated AVR   Risk of Mortality: 3.797%  Renal Failure: 3.881%  Permanent Stroke: 0.827%  Prolonged Ventilation: 14.170%  DSW Infection: 0.321%  Reoperation: 2.771%  Morbidity or Mortality: 18.813%  Short Length of Stay: 23.318%  Long Length of Stay: 9.984%   ASSESSMENT AND PLAN: Severe Stage D aortic stenosis: The patient's progressive symptoms,  physical exam findings, and echo findings are all consistent with severe symptomatic aortic stenosis. She now has NYHA III symptoms of chronic diastolic heart failure.   I have reviewed the natural history of aortic stenosis with the patient and their family members who are present today. We have discussed the limitations of medical therapy and the poor prognosis associated with symptomatic aortic stenosis. We have reviewed potential treatment options, including palliative medical therapy, conventional surgical aortic valve replacement, and transcatheter aortic valve replacement. We discussed treatment options in the context of the patient's specific comorbid medical conditions.   The patient's previous evaluation included cardiac catheterization demonstrating patent coronary arteries with minor nonobstructive CAD.  However, her most recent  catheterization study was 4 years ago and will need to be updated.  I have reviewed the risks, indications, and alternatives to cardiac catheterization, possible angioplasty, and stenting with the patient. Risks include but are not limited to bleeding, infection, vascular injury, stroke, myocardial infection, arrhythmia, kidney injury, radiation-related injury in the case of prolonged fluoroscopy use, emergency cardiac surgery, and death. The patient understands the risks of serious complication is 1-2 in 8127 with diagnostic cardiac cath and 1-2% or less with angioplasty/stenting.   Following cardiac catheterization, the patient will be referred for CT angiogram studies of the heart as well as the chest, abdomen, and pelvis.  She will then undergo surgical evaluation as part of a multidisciplinary approach to her care.  She understands that the presence of morbid obesity with body mass index of 58 poses a significant procedural/surgical risk whether she were to undergo conventional surgery or transcatheter aortic valve replacement.  We will determine the safest way to  treat her severe aortic stenosis once all of her studies are completed.  Current medicines are reviewed with the patient today.  The patient does not have concerns regarding medicines.  Labs/ tests ordered today include:   Orders Placed This Encounter  Procedures  . CT CORONARY MORPH W/CTA COR W/SCORE W/CA W/CM &/OR WO/CM  . CT ANGIO ABDOMEN PELVIS  W &/OR WO CONTRAST  . CT ANGIO CHEST AORTA W &/OR WO CONTRAST  . Basic metabolic panel  . CBC with Differential/Platelet  . EKG 12-Lead  . Pulmonary Function Test   Disposition:   As above  Signed, [name], MD  10/30/2017 3:13 PM    [facility] [place], [place], [place]  [address] Phone: [phone]; Fax: [phone]

## 2017-10-27 LAB — BASIC METABOLIC PANEL
BUN/Creatinine Ratio: 15 (ref 12–28)
BUN: 15 mg/dL (ref 8–27)
CALCIUM: 9 mg/dL (ref 8.7–10.3)
CHLORIDE: 99 mmol/L (ref 96–106)
CO2: 26 mmol/L (ref 20–29)
Creatinine, Ser: 0.97 mg/dL (ref 0.57–1.00)
GFR calc Af Amer: 66 mL/min/{1.73_m2} (ref >59)
GFR calc non Af Amer: 57 mL/min/{1.73_m2} — ABNORMAL LOW (ref >59)
Glucose: 102 mg/dL — ABNORMAL HIGH (ref 65–99)
POTASSIUM: 4.1 mmol/L (ref 3.5–5.2)
SODIUM: 142 mmol/L (ref 134–144)

## 2017-10-27 LAB — CBC WITH DIFFERENTIAL/PLATELET
Basophils Absolute: 0 10*3/uL (ref 0.0–0.2)
Basos: 0 %
EOS (ABSOLUTE): 0.3 10*3/uL (ref 0.0–0.4)
Eos: 2 %
HEMATOCRIT: 37.2 % (ref 34.0–46.6)
Hemoglobin: 11.7 g/dL (ref 11.1–15.9)
Immature Grans (Abs): 0.1 10*3/uL (ref 0.0–0.1)
Immature Granulocytes: 1 %
LYMPHS ABS: 3.6 10*3/uL — AB (ref 0.7–3.1)
Lymphs: 29 %
MCH: 28.6 pg (ref 26.6–33.0)
MCHC: 31.5 g/dL (ref 31.5–35.7)
MCV: 91 fL (ref 79–97)
MONOS ABS: 0.7 10*3/uL (ref 0.1–0.9)
Monocytes: 5 %
Neutrophils Absolute: 7.5 10*3/uL — ABNORMAL HIGH (ref 1.4–7.0)
Neutrophils: 63 %
Platelets: 175 10*3/uL (ref 150–450)
RBC: 4.09 x10E6/uL (ref 3.77–5.28)
RDW: 16.2 % — AB (ref 12.3–15.4)
WBC: 12.1 10*3/uL — ABNORMAL HIGH (ref 3.4–10.8)

## 2017-11-01 ENCOUNTER — Telehealth: Payer: Self-pay | Admitting: *Deleted

## 2017-11-01 NOTE — Telephone Encounter (Signed)
Pt contacted pre-catheterization scheduled at [facility] for: Wednesday November 02, 2017 10 AM Verified arrival time and place: [place] Entrance A at: 8 AM  No solid food after midnight prior to cath, clear liquids until 5 AM day of procedure. Verified allergies in Epic  Hold: Metformin-AM of procedure and 48 hours post procedure. Glimepiride-AM of procedure. Furosemide-AM of procedure. KCl -AM of procedure. Enalapril -AM of procedure  AM meds can be  taken pre-cath with sip of water including: ASA 81 mg  Confirmed patient has responsible person to drive home post procedure and for 24 hours after you arrive home: yes

## 2017-11-02 ENCOUNTER — Encounter (HOSPITAL_COMMUNITY)
Admission: RE | Disposition: A | Discharge status: Home / Self Care | Payer: Self-pay | Source: Ambulatory Visit | Attending: Cardiovascular Disease

## 2017-11-02 ENCOUNTER — Ambulatory Visit (HOSPITAL_COMMUNITY)
Admission: RE | Admit: 2017-11-02 | Discharge: 2017-11-02 | Disposition: A | Discharge status: Home / Self Care | Payer: Medicare Other | Source: Ambulatory Visit | Attending: Cardiovascular Disease | Admitting: Cardiovascular Disease

## 2017-11-02 DIAGNOSIS — K589 Irritable bowel syndrome without diarrhea: Secondary | ICD-10-CM | POA: Diagnosis not present

## 2017-11-02 DIAGNOSIS — I350 Nonrheumatic aortic (valve) stenosis: Secondary | ICD-10-CM | POA: Diagnosis present

## 2017-11-02 DIAGNOSIS — Z880 Allergy status to penicillin: Secondary | ICD-10-CM | POA: Diagnosis not present

## 2017-11-02 DIAGNOSIS — E785 Hyperlipidemia, unspecified: Secondary | ICD-10-CM | POA: Diagnosis not present

## 2017-11-02 DIAGNOSIS — Z885 Allergy status to narcotic agent status: Secondary | ICD-10-CM | POA: Insufficient documentation

## 2017-11-02 DIAGNOSIS — I2510 Atherosclerotic heart disease of native coronary artery without angina pectoris: Secondary | ICD-10-CM | POA: Diagnosis not present

## 2017-11-02 DIAGNOSIS — K219 Gastro-esophageal reflux disease without esophagitis: Secondary | ICD-10-CM | POA: Insufficient documentation

## 2017-11-02 DIAGNOSIS — K5730 Diverticulosis of large intestine without perforation or abscess without bleeding: Secondary | ICD-10-CM | POA: Diagnosis not present

## 2017-11-02 DIAGNOSIS — Z882 Allergy status to sulfonamides status: Secondary | ICD-10-CM | POA: Insufficient documentation

## 2017-11-02 DIAGNOSIS — I10 Essential (primary) hypertension: Secondary | ICD-10-CM | POA: Insufficient documentation

## 2017-11-02 DIAGNOSIS — Z6841 Body Mass Index (BMI) 40.0 and over, adult: Secondary | ICD-10-CM | POA: Diagnosis not present

## 2017-11-02 DIAGNOSIS — M797 Fibromyalgia: Secondary | ICD-10-CM | POA: Insufficient documentation

## 2017-11-02 DIAGNOSIS — K760 Fatty (change of) liver, not elsewhere classified: Secondary | ICD-10-CM | POA: Diagnosis not present

## 2017-11-02 DIAGNOSIS — Z87891 Personal history of nicotine dependence: Secondary | ICD-10-CM | POA: Insufficient documentation

## 2017-11-02 HISTORY — PX: RIGHT/LEFT HEART CATH AND CORONARY ANGIOGRAPHY: CATH118266

## 2017-11-02 LAB — POCT I-STAT 3, ART BLOOD GAS (G3+)
ACID-BASE EXCESS: 3 mmol/L — AB (ref 0.0–2.0)
BICARBONATE: 28 mmol/L (ref 20.0–28.0)
O2 SAT: 92 %
TCO2: 29 mmol/L (ref 22–32)
pCO2 arterial: 46.3 mmHg (ref 32.0–48.0)
pH, Arterial: 7.39 (ref 7.350–7.450)
pO2, Arterial: 65 mmHg — ABNORMAL LOW (ref 83.0–108.0)

## 2017-11-02 LAB — POCT I-STAT 3, VENOUS BLOOD GAS (G3P V)
ACID-BASE EXCESS: 3 mmol/L — AB (ref 0.0–2.0)
Bicarbonate: 29.3 mmol/L — ABNORMAL HIGH (ref 20.0–28.0)
O2 SAT: 67 %
TCO2: 31 mmol/L (ref 22–32)
pCO2, Ven: 50.1 mmHg (ref 44.0–60.0)
pH, Ven: 7.375 (ref 7.250–7.430)
pO2, Ven: 36 mmHg (ref 32.0–45.0)

## 2017-11-02 LAB — GLUCOSE, CAPILLARY
GLUCOSE-CAPILLARY: 155 mg/dL — AB (ref 70–99)
Glucose-Capillary: 87 mg/dL (ref 70–99)

## 2017-11-02 SURGERY — RIGHT/LEFT HEART CATH AND CORONARY ANGIOGRAPHY
Anesthesia: LOCAL

## 2017-11-02 MED ORDER — MIDAZOLAM HCL 2 MG/2ML IJ SOLN
INTRAMUSCULAR | Status: AC
  Filled 2017-11-02: qty 2

## 2017-11-02 MED ORDER — HEPARIN SODIUM (PORCINE) 1000 UNIT/ML IJ SOLN
INTRAMUSCULAR | Status: DC | PRN
  Administered 2017-11-02: 6000 [IU] via INTRAVENOUS

## 2017-11-02 MED ORDER — FENTANYL CITRATE (PF) 100 MCG/2ML IJ SOLN
INTRAMUSCULAR | Status: DC | PRN
  Administered 2017-11-02 (×2): 50 ug via INTRAVENOUS

## 2017-11-02 MED ORDER — SODIUM CHLORIDE 0.9 % IV SOLN: INTRAVENOUS | Status: AC

## 2017-11-02 MED ORDER — SODIUM CHLORIDE 0.9 % WEIGHT BASED INFUSION: 1.0000 mL/kg/h | INTRAVENOUS | Status: DC

## 2017-11-02 MED ORDER — SODIUM CHLORIDE 0.9% FLUSH: 3.0000 mL | INTRAVENOUS | Status: DC | PRN

## 2017-11-02 MED ORDER — SODIUM CHLORIDE 0.9% FLUSH: 3.0000 mL | Freq: Two times a day (BID) | INTRAVENOUS | Status: DC

## 2017-11-02 MED ORDER — LIDOCAINE HCL (PF) 1 % IJ SOLN
INTRAMUSCULAR | Status: DC | PRN
  Administered 2017-11-02: 1 mL

## 2017-11-02 MED ORDER — ONDANSETRON HCL 4 MG/2ML IJ SOLN: 4.0000 mg | Freq: Four times a day (QID) | INTRAMUSCULAR | Status: DC | PRN

## 2017-11-02 MED ORDER — SODIUM CHLORIDE 0.9 % IV SOLN: 250.0000 mL | INTRAVENOUS | Status: DC | PRN

## 2017-11-02 MED ORDER — HEPARIN SODIUM (PORCINE) 1000 UNIT/ML IJ SOLN
INTRAMUSCULAR | Status: AC
  Filled 2017-11-02: qty 1

## 2017-11-02 MED ORDER — LIDOCAINE HCL (PF) 1 % IJ SOLN
INTRAMUSCULAR | Status: AC
  Filled 2017-11-02: qty 30

## 2017-11-02 MED ORDER — ACETAMINOPHEN 325 MG PO TABS: 650.0000 mg | ORAL_TABLET | ORAL | Status: DC | PRN

## 2017-11-02 MED ORDER — HEPARIN (PORCINE) IN NACL 1000-0.9 UT/500ML-% IV SOLN
INTRAVENOUS | Status: AC
  Filled 2017-11-02: qty 1000

## 2017-11-02 MED ORDER — ASPIRIN 81 MG PO CHEW: 81.0000 mg | CHEWABLE_TABLET | ORAL | Status: DC

## 2017-11-02 MED ORDER — SODIUM CHLORIDE 0.9 % WEIGHT BASED INFUSION
3.0000 mL/kg/h | INTRAVENOUS | Status: AC
  Administered 2017-11-02: 3 mL/kg/h via INTRAVENOUS

## 2017-11-02 MED ORDER — IOHEXOL 350 MG/ML SOLN
INTRAVENOUS | Status: DC | PRN
  Administered 2017-11-02: 60 mL via INTRAVENOUS

## 2017-11-02 MED ORDER — VERAPAMIL HCL 2.5 MG/ML IV SOLN
INTRAVENOUS | Status: DC | PRN
  Administered 2017-11-02: 10 mL via INTRA_ARTERIAL

## 2017-11-02 MED ORDER — MIDAZOLAM HCL 2 MG/2ML IJ SOLN
INTRAMUSCULAR | Status: DC | PRN
  Administered 2017-11-02 (×2): 1 mg via INTRAVENOUS

## 2017-11-02 MED ORDER — VERAPAMIL HCL 2.5 MG/ML IV SOLN
INTRAVENOUS | Status: AC
  Filled 2017-11-02: qty 2

## 2017-11-02 MED ORDER — FENTANYL CITRATE (PF) 100 MCG/2ML IJ SOLN
INTRAMUSCULAR | Status: AC
  Filled 2017-11-02: qty 2

## 2017-11-02 MED ORDER — HEPARIN (PORCINE) IN NACL 1000-0.9 UT/500ML-% IV SOLN
INTRAVENOUS | Status: DC | PRN
  Administered 2017-11-02 (×2): 500 mL

## 2017-11-02 SURGICAL SUPPLY — 15 items
CATH 5FR JL3.5 JR4 ANG PIG MP (CATHETERS) ×2 IMPLANT
CATH BALLN WEDGE 5F 110CM (CATHETERS) ×2 IMPLANT
CATH INFINITI 5 FR AL2 (CATHETERS) ×2 IMPLANT
CATH INFINITI 5FR AL1 (CATHETERS) ×2 IMPLANT
DEVICE RAD COMP TR BAND LRG (VASCULAR PRODUCTS) ×2 IMPLANT
ELECT DEFIB PAD ADLT CADENCE (PAD) ×2 IMPLANT
GLIDESHEATH SLEND SS 6F .021 (SHEATH) ×2 IMPLANT
GUIDEWIRE INQWIRE 1.5J.035X260 (WIRE) ×1 IMPLANT
INQWIRE 1.5J .035X260CM (WIRE) ×2
KIT HEART LEFT (KITS) ×2 IMPLANT
PACK CARDIAC CATHETERIZATION (CUSTOM PROCEDURE TRAY) ×2 IMPLANT
SHEATH GLIDE SLENDER 4/5FR (SHEATH) ×2 IMPLANT
TRANSDUCER W/STOPCOCK (MISCELLANEOUS) ×2 IMPLANT
TUBING CIL FLEX 10 FLL-RA (TUBING) ×2 IMPLANT
WIRE EMERALD ST .035X150CM (WIRE) ×2 IMPLANT

## 2017-11-02 NOTE — Discharge Instructions (Signed)
Hold metformin for 48 hours post cath.  Drink plenty of fluids over next 48 hours and keep left wrist elevated at heart level for 24 hours  Radial Site Care Refer to this sheet in the next few weeks. These instructions provide you with information about caring for yourself after your procedure. Your health care provider may also give you more specific instructions. Your treatment has been planned according to current medical practices, but problems sometimes occur. Call your health care provider if you have any problems or questions after your procedure. What can I expect after the procedure? After your procedure, it is typical to have the following:  Bruising at the radial site that usually fades within 1-2 weeks.  Blood collecting in the tissue (hematoma) that may be painful to the touch. It should usually decrease in size and tenderness within 1-2 weeks.  Follow these instructions at home:  Take medicines only as directed by your health care provider.  You may shower 24-48 hours after the procedure or as directed by your health care provider. Remove the bandage (dressing) and gently wash the site with plain soap and water. Pat the area dry with a clean towel. Do not rub the site, because this may cause bleeding.  Do not take baths, swim, or use a hot tub until your health care provider approves.  Check your insertion site every day for redness, swelling, or drainage.  Do not apply powder or lotion to the site.  Do not flex or bend the affected arm for 24 hours or as directed by your health care provider.  Do not push or pull heavy objects with the affected arm for 24 hours or as directed by your health care provider.  Do not lift over 10 lb (4.5 kg) for 5 days after your procedure or as directed by your health care provider.  Ask your health care provider when it is okay to: ? Return to work or school. ? Resume usual physical activities or sports. ? Resume sexual activity.  Do  not drive home if you are discharged the same day as the procedure. Have someone else drive you.  You may drive 24 hours after the procedure unless otherwise instructed by your health care provider.  Do not operate machinery or power tools for 24 hours after the procedure.  If your procedure was done as an outpatient procedure, which means that you went home the same day as your procedure, a responsible adult should be with you for the first 24 hours after you arrive home.  Keep all follow-up visits as directed by your health care provider. This is important. Contact a health care provider if:  You have a fever.  You have chills.  You have increased bleeding from the radial site. Hold pressure on the site. Get help right away if:  You have unusual pain at the radial site.  You have redness, warmth, or swelling at the radial site.  You have drainage (other than a small amount of blood on the dressing) from the radial site.  The radial site is bleeding, and the bleeding does not stop after 30 minutes of holding steady pressure on the site.  Your arm or hand becomes pale, cool, tingly, or numb. This information is not intended to replace advice given to you by your health care provider. Make sure you discuss any questions you have with your health care provider. Document Released: 03/13/2010 Document Revised: 07/17/2015 Document Reviewed: 08/27/2013 Elsevier Interactive Patient Education  2018  [place].  Moderate Conscious Sedation, Adult, Care After These instructions provide you with information about caring for yourself after your procedure. Your health care provider may also give you more specific instructions. Your treatment has been planned according to current medical practices, but problems sometimes occur. Call your health care provider if you have any problems or questions after your procedure. What can I expect after the procedure? After your procedure, it is  common:  To feel sleepy for several hours.  To feel clumsy and have poor balance for several hours.  To have poor judgment for several hours.  To vomit if you eat too soon.  Follow these instructions at home: For at least 24 hours after the procedure:   Do not: ? Participate in activities where you could fall or become injured. ? Drive. ? Use heavy machinery. ? Drink alcohol. ? Take sleeping pills or medicines that cause drowsiness. ? Make important decisions or sign legal documents. ? Take care of children on your own.  Rest. Eating and drinking  Follow the diet recommended by your health care provider.  If you vomit: ? Drink water, juice, or soup when you can drink without vomiting. ? Make sure you have little or no nausea before eating solid foods. General instructions  Have a responsible adult stay with you until you are awake and alert.  Take over-the-counter and prescription medicines only as told by your health care provider.  If you smoke, do not smoke without supervision.  Keep all follow-up visits as told by your health care provider. This is important. Contact a health care provider if:  You keep feeling nauseous or you keep vomiting.  You feel light-headed.  You develop a rash.  You have a fever. Get help right away if:  You have trouble breathing. This information is not intended to replace advice given to you by your health care provider. Make sure you discuss any questions you have with your health care provider. Document Released: 11/29/2012 Document Revised: 07/14/2015 Document Reviewed: 05/31/2015 Elsevier Interactive Patient Education  [employer].

## 2017-11-02 NOTE — Interval H&P Note (Signed)
History and Physical Interval Note:  11/02/2017 10:11 AM  [name]  has presented today for cardiac cath with the diagnosis of severe AS. The various methods of treatment have been discussed with the patient and family. After consideration of risks, benefits and other options for treatment, the patient has consented to  Procedure(s): RIGHT/LEFT HEART CATH AND CORONARY ANGIOGRAPHY (N/A) as a surgical intervention .  The patient's history has been reviewed, patient examined, no change in status, stable for surgery.  I have reviewed the patient's chart and labs.  Questions were answered to the patient's satisfaction.    Cath Lab Visit (complete for each Cath Lab visit)  Clinical Evaluation Leading to the Procedure:   ACS: No.  Non-ACS:    Anginal Classification: CCS II  Anti-ischemic medical therapy: No Therapy  Non-Invasive Test Results: No non-invasive testing performed  Prior CABG: No previous CABG         Lauree Chandler

## 2017-11-03 ENCOUNTER — Encounter (HOSPITAL_COMMUNITY): Payer: Self-pay | Admitting: Cardiovascular Disease

## 2017-11-03 ENCOUNTER — Other Ambulatory Visit: Payer: Self-pay

## 2017-11-03 MED ORDER — METOPROLOL TARTRATE 50 MG PO TABS: ORAL_TABLET | ORAL | 0 refills | Status: DC

## 2017-11-14 ENCOUNTER — Ambulatory Visit (HOSPITAL_COMMUNITY)
Admission: RE | Admit: 2017-11-14 | Discharge: 2017-11-14 | Disposition: A | Discharge status: Home / Self Care | Payer: Medicare Other | Source: Ambulatory Visit | Attending: Cardiovascular Disease | Admitting: Cardiovascular Disease

## 2017-11-14 ENCOUNTER — Ambulatory Visit (HOSPITAL_BASED_OUTPATIENT_CLINIC_OR_DEPARTMENT_OTHER)
Admission: RE | Admit: 2017-11-14 | Discharge: 2017-11-14 | Disposition: A | Discharge status: Home / Self Care | Payer: Medicare Other | Source: Ambulatory Visit | Attending: Cardiovascular Disease | Admitting: Cardiovascular Disease

## 2017-11-14 DIAGNOSIS — I2510 Atherosclerotic heart disease of native coronary artery without angina pectoris: Secondary | ICD-10-CM | POA: Diagnosis not present

## 2017-11-14 DIAGNOSIS — K760 Fatty (change of) liver, not elsewhere classified: Secondary | ICD-10-CM | POA: Diagnosis not present

## 2017-11-14 DIAGNOSIS — I700 Atherosclerosis of aorta: Secondary | ICD-10-CM | POA: Insufficient documentation

## 2017-11-14 DIAGNOSIS — E785 Hyperlipidemia, unspecified: Secondary | ICD-10-CM | POA: Diagnosis not present

## 2017-11-14 DIAGNOSIS — K7460 Unspecified cirrhosis of liver: Secondary | ICD-10-CM | POA: Insufficient documentation

## 2017-11-14 DIAGNOSIS — I10 Essential (primary) hypertension: Secondary | ICD-10-CM | POA: Insufficient documentation

## 2017-11-14 DIAGNOSIS — I350 Nonrheumatic aortic (valve) stenosis: Secondary | ICD-10-CM

## 2017-11-14 DIAGNOSIS — I517 Cardiomegaly: Secondary | ICD-10-CM | POA: Insufficient documentation

## 2017-11-14 MED ORDER — IOHEXOL 300 MG/ML  SOLN
100.0000 mL | Freq: Once | INTRAMUSCULAR | Status: AC | PRN
  Administered 2017-11-14: 100 mL via INTRAVENOUS

## 2017-11-14 NOTE — Progress Notes (Signed)
*  PRELIMINARY RESULTS* Vascular Ultrasound Carotid Duplex (Doppler) has been completed.   Findings suggest 1-39% internal carotid artery stenosis bilaterally. Vertebral arteries are patent with antegrade flow.  11/14/2017 11:25 AM Maudry Mayhew, MHA, RVT, RDCS, RDMS

## 2017-11-17 ENCOUNTER — Ambulatory Visit: Payer: Medicare Other | Admitting: Physical Therapy

## 2017-11-17 ENCOUNTER — Encounter: Payer: Medicare Other | Admitting: Thoracic Surgery (Cardiothoracic Vascular Surgery)

## 2017-11-23 ENCOUNTER — Encounter: Payer: Self-pay | Admitting: Thoracic Surgery (Cardiothoracic Vascular Surgery)

## 2017-11-23 ENCOUNTER — Encounter: Payer: Self-pay | Admitting: Physical Therapy

## 2017-11-23 ENCOUNTER — Encounter (HOSPITAL_COMMUNITY): Payer: Self-pay

## 2017-11-23 ENCOUNTER — Institutional Professional Consult (permissible substitution): Payer: Medicare Other | Admitting: Thoracic Surgery (Cardiothoracic Vascular Surgery)

## 2017-11-23 ENCOUNTER — Encounter (HOSPITAL_COMMUNITY): Payer: Medicare Other

## 2017-11-23 ENCOUNTER — Ambulatory Visit: Payer: Medicare Other | Attending: Cardiovascular Disease | Admitting: Physical Therapy

## 2017-11-23 ENCOUNTER — Other Ambulatory Visit: Payer: Self-pay

## 2017-11-23 VITALS — BP 107/57 | HR 82 | Resp 20 | Ht 62.0 in | Wt 300.0 lb

## 2017-11-23 DIAGNOSIS — I350 Nonrheumatic aortic (valve) stenosis: Secondary | ICD-10-CM

## 2017-11-23 DIAGNOSIS — R2689 Other abnormalities of gait and mobility: Secondary | ICD-10-CM | POA: Insufficient documentation

## 2017-11-23 NOTE — H&P (View-Only) (Signed)
HEART AND [place] SURGERY CONSULTATION REPORT  Referring Provider is Jacolyn Reedy, MD PCP is Leonard Downing, MD  Chief Complaint  Patient presents with  . Aortic Stenosis    Surgical eval for TAVR review all studies..she still needs PFT scheduled    HPI:  Patient is a 77 year old morbidly obese female with history of aortic stenosis, hypertension, hyperlipidemia, type 2 diabetes mellitus without complications, fibromyalgia, fatty liver disease, and limited physical mobility who has been referred for surgical consultation to discuss treatment options for management of severe symptomatic stenosis.  Patient was noted to have a heart murmur on physical exam several years ago by her primary care physician.  She was diagnosed with aortic stenosis and referred to Dr. Wynonia Lawman who has been following her carefully ever since.  Echocardiograms have demonstrated a gradual progression in the severity of aortic stenosis.  She was referred to the multidisciplinary heart valve clinic and evaluated previously by Dr. Burt Knack.  At that time her aortic stenosis was moderate, bordering on severe the patient had no clear symptoms attributable to her aortic stenosis.  Continued close follow-up was recommended.  Over the past year the patient has developed worsening exertional shortness of breath and generalized weakness.  She has become progressively less mobile.  Over the past 3 months she had 2 frank syncopal episodes.  She was seen in follow-up by Dr. Wynonia Lawman and repeat echocardiogram revealed normal left ventricular systolic function with severe aortic stenosis.  Peak velocity across aortic valve measured 4.1 m/s corresponding to mean transvalvular gradient estimated 36 mmHg.  She was referred back to Dr. Burt Knack and left and right heart catheterization was performed November 02, 2017.  Catheterization confirmed the presence of severe aortic  stenosis with mean transvalvular gradient measured 51.6 mmHg corresponding to aortic valve area calculated 0.82 cm.  The patient was noted to have mild nonobstructive coronary artery disease.  There was mild pulmonary hypertension.  CT angiography was performed and the patient was referred for surgical consultation.  The patient is widowed and lives alone in [place].  She is accompanied by her brother for her office consultation today.  She has an adult son who lives close by and is reportedly supportive.  The patient admits that she lives a very sedentary lifestyle.  She states that she really cannot get around very well.  She walks using a cane for support.  She has had several dizzy spells and feels generalized weakness.  She had 2 frank syncopal episodes.  She gets short of breath with low-level activity.  She reports occasional resting shortness of breath, PND, and orthopnea.  She has some mild lower extremity edema.  She has not had chest pain or chest tightness either with activity or at rest.  Past Medical History:  Diagnosis Date  . Anxiety    self reported  . Diabetes mellitus without complication ([place])   . Diarrhea   . Diverticulosis of colon (without mention of hemorrhage)   . Esophageal reflux   . External hemorrhoids without mention of complication   . Fatty liver disease, nonalcoholic   . Fibromyalgia   . Hyperlipemia   . Hypertension   . IBS (irritable bowel syndrome)   . Obesity, unspecified   . Personal history of colonic polyps    adenomatous & TUBULAR ADENOMA  . Psoriasis     Past Surgical History:  Procedure Laterality Date  . APPENDECTOMY    . BACK SURGERY    .  CHOLECYSTECTOMY    . LEFT AND RIGHT HEART CATHETERIZATION WITH CORONARY ANGIOGRAM N/A 11/15/2013   Procedure: LEFT AND RIGHT HEART CATHETERIZATION WITH CORONARY ANGIOGRAM;  Surgeon: Blane Ohara, MD;  Location: [facility] CATH LAB;  Service: Cardiovascular;  Laterality: N/A;  . RIGHT/LEFT HEART  CATH AND CORONARY ANGIOGRAPHY N/A 11/02/2017   Procedure: RIGHT/LEFT HEART CATH AND CORONARY ANGIOGRAPHY;  Surgeon: Burnell Blanks, MD;  Location: [facility] CV LAB;  Service: Cardiovascular;  Laterality: N/A;  . VAGINAL HYSTERECTOMY      Family History  Problem Relation Age of Onset  . Lung cancer Mother   . Diabetes Maternal Grandmother   . Heart disease Maternal Grandfather   . Colon cancer Neg Hx     Social History   Socioeconomic History  . Marital status: Widowed    Spouse name: Not on file  . Number of children: Not on file  . Years of education: Not on file  . Highest education level: Not on file  Occupational History  . Not on file  Social Needs  . Financial resource strain: Not on file  . Food insecurity:    Worry: Not on file    Inability: Not on file  . Transportation needs:    Medical: Not on file    Non-medical: Not on file  Tobacco Use  . Smoking status: Former Research scientist (life sciences)  . Smokeless tobacco: Never Used  Substance and Sexual Activity  . Alcohol use: No  . Drug use: No  . Sexual activity: Not on file  Lifestyle  . Physical activity:    Days per week: Not on file    Minutes per session: Not on file  . Stress: Not on file  Relationships  . Social connections:    Talks on phone: Not on file    Gets together: Not on file    Attends religious service: Not on file    Active member of club or organization: Not on file    Attends meetings of clubs or organizations: Not on file    Relationship status: Not on file  . Intimate partner violence:    Fear of current or ex partner: Not on file    Emotionally abused: Not on file    Physically abused: Not on file    Forced sexual activity: Not on file  Other Topics Concern  . Not on file  Social History Narrative  . Not on file    Current Outpatient Medications  Medication Sig Dispense Refill  . aspirin EC 81 MG tablet Take 81 mg by mouth once.    . Aspirin-Salicylamide-Caffeine (BC HEADACHE POWDER  PO) Take 1 packet by mouth daily as needed (back pain).    Marland Kitchen atorvastatin (LIPITOR) 40 MG tablet Take 40 mg by mouth daily.    . Cholecalciferol (VITAMIN D) 2000 units CAPS Take 2,000 Units by mouth daily.     . Cranberry 250 MG TABS Take 1 tablet by mouth daily.    . cyclobenzaprine (FLEXERIL) 10 MG tablet Take 10 mg by mouth 3 (three) times daily as needed for muscle spasms.    . enalapril (VASOTEC) 10 MG tablet Take 10 mg by mouth daily.    . furosemide (LASIX) 80 MG tablet Take 80 mg by mouth 3 (three) times daily.     Marland Kitchen glimepiride (AMARYL) 4 MG tablet Take 8 mg by mouth daily with breakfast.     . metFORMIN (GLUMETZA) 1000 MG (MOD) 24 hr tablet Take 1,000 mg by mouth 2 (two)  times daily with a meal.     . metoprolol tartrate (LOPRESSOR) 50 MG tablet Take 1 tablet by mouth 1 hour prior to TAVR CT scans 1 tablet 0  . Multiple Vitamin (MULTIVITAMIN WITH MINERALS) TABS tablet Take 1 tablet by mouth daily.    Marland Kitchen POTASSIUM PO Take 1 tablet by mouth daily as needed (when taking a fluid pill).    . traMADol (ULTRAM) 50 MG tablet Take 50-100 mg by mouth every 6 (six) hours as needed for moderate pain.    . vitamin E 400 UNIT capsule Take 800 Units by mouth daily.     No current facility-administered medications for this visit.     Allergies  Allergen Reactions  . Sulfonamide Derivatives Anaphylaxis  . Amoxicillin Diarrhea  . Castor Oil     "passed out"  . Demerol [Meperidine] Nausea And Vomiting  . Hydrocodone Nausea And Vomiting      Review of Systems:   General:  normal appetite, decreased energy, no weight gain, no weight loss, no fever  Cardiac:  no chest pain with exertion, no chest pain at rest, +SOB with exertion, occasional resting SOB, + PND, + orthopnea, no palpitations, no arrhythmia, no atrial fibrillation, + LE edema, + dizzy spells, + syncope  Respiratory:  + shortness of breath, no home oxygen, no productive cough, no dry cough, no bronchitis, no wheezing, no hemoptysis,  no asthma, no pain with inspiration or cough, no sleep apnea, no CPAP at night  GI:   no difficulty swallowing, no reflux, no frequent heartburn, no hiatal hernia, no abdominal pain, no constipation, no diarrhea, no hematochezia, no hematemesis, no melena  GU:   + dysuria,  + frequency, + recent urinary tract infection, no hematuria, no kidney stones, no kidney disease  Vascular:  no pain suggestive of claudication, no pain in feet, no leg cramps, no varicose veins, no DVT, no non-healing foot ulcer  Neuro:   no stroke, no TIA's, no seizures, no headaches, no temporary blindness one eye,  no slurred speech, no peripheral neuropathy, no chronic pain, + instability of gait, no memory/cognitive dysfunction  Musculoskeletal: + arthritis, no joint swelling, no myalgias, + difficulty walking, limited mobility   Skin:   + rash, + itching, no skin infections, no pressure sores or ulcerations  Psych:   + anxiety, + depression, + nervousness, no unusual recent stress  Eyes:   no blurry vision, no floaters, no recent vision changes, + wears glasses or contacts  ENT:   + hearing loss, no loose or painful teeth, no dentures, last saw dentist within the past year  Hematologic:  no easy bruising, no abnormal bleeding, no clotting disorder, no frequent epistaxis  Endocrine:  + diabetes, does check CBG's at home           Physical Exam:   BP (!) 107/57   Pulse 82   Resp 20   Ht 5\' 2"  (1.575 m)   Wt 300 lb (136.1 kg)   SpO2 98% Comment: RA  BMI 54.87 kg/m   General:  Morbidly obese female NAD    HEENT:  Unremarkable   Neck:   no JVD, no bruits, no adenopathy   Chest:   clear to auscultation, symmetrical breath sounds, no wheezes, no rhonchi   CV:   RRR, grade III/VI crescendo/decrescendo murmur heard best at RSB,  no diastolic murmur  Abdomen:  soft, non-tender, no masses, very large paniculus  Extremities:  warm, well-perfused, pulses not palpable, + LE edema  Rectal/GU  Deferred  Neuro:   Grossly  non-focal and symmetrical throughout  Skin:   Clean and dry, no rashes, no breakdown   Diagnostic Tests:  TRANSTHORACIC ECHOCARDIOGRAM  Report from transthoracic echocardiogram performed October 03, 2017 at Dr. Thurman Coyer office is reviewed.  Left ventricular cavity size was normal.  There was moderate concentric left ventricular hypertrophy.  Left ventricular systolic function was mildly decreased with ejection fraction estimated 50%.  There was severe left atrial enlargement.  There was severe aortic stenosis.  Peak velocity across the aortic valve measured 4.12 m/s corresponding to mean transvalvular gradient estimated 34 mmHg.  Aortic valve area was calculated 0.79 cm.  DVI was not reported.  The leaflets were thickened with severely restricted leaflet mobility.  There was mild mitral regurgitation and trace tricuspid regurgitation.   RIGHT/LEFT HEART CATH AND CORONARY ANGIOGRAPHY  Conclusion     Mid RCA lesion is 10% stenosed.  Prox LAD to Mid LAD lesion is 10% stenosed.   1. Mild non-obstructive CAD 2. Severe aortic stenosis (peak to peak gradient 62 mmHg, mean gradient 51.6 mmHg, AVA 0.82 cm2)  Recommendations: Will continue workup for TAVR.     Indications   Severe aortic stenosis [I35.0 (ICD-10-CM)]  Procedural Details/Technique   Technical Details Indication: 77 yo female with morbid obesity, HTN, HLD and severe aortic stenosis.   Procedure: The risks, benefits, complications, treatment options, and expected outcomes were discussed with the patient. The patient and/or family concurred with the proposed plan, giving informed consent. The patient was brought to the cath lab after IV hydration was given. The patient was further sedated with Versed and Fentanyl. The IV catheter in the right antecubital vein was prepped and draped. I then changed out this IV catheter for a 5 French sheath. Right heart catheterization performed with a balloon tipped catheter. The left wrist was  prepped and draped in a sterile fashion. 1% lidocaine was used for local anesthesia. Using the modified Seldinger access technique, a 5 French sheath was placed in the left radial artery. 3 mg Verapamil was given through the sheath. 6000 units IV heparin was given. Standard diagnostic catheters were used to perform selective coronary angiography. I crossed the aortic valve with an AL-2 catheter and a straight wire. LV pressures measured. The sheath was removed from the left radial artery and a Terumo hemostasis band was applied at the arteriotomy site on the left wrist.     Estimated blood loss <50 mL.  During this procedure the patient was administered the following to achieve and maintain moderate conscious sedation: Versed 2 mg, Fentanyl 100 mcg, while the patient's heart rate, blood pressure, and oxygen saturation were continuously monitored. The period of conscious sedation was 43 minutes, of which I was present face-to-face 100% of this time.  Complications   Complications documented before study signed (11/02/2017 11:38 AM EDT)    RIGHT/LEFT HEART CATH AND CORONARY ANGIOGRAPHY   None Documented by Burnell Blanks, MD 11/02/2017 11:27 AM EDT  Time Range: Intraprocedure      Coronary Findings   Diagnostic  Dominance: Right  Left Anterior Descending  Vessel is large.  Prox LAD to Mid LAD lesion 10% stenosed  Prox LAD to Mid LAD lesion is 10% stenosed.  First Diagonal Branch  Vessel is moderate in size.  Left Circumflex  First Obtuse Marginal Branch  Right Coronary Artery  Vessel is large.  Mid RCA lesion 10% stenosed  Mid RCA lesion is 10% stenosed.  Intervention   No  interventions have been documented.  Coronary Diagrams   Diagnostic Diagram       Implants    No implant documentation for this case.  MERGE Images   Show images for CARDIAC CATHETERIZATION   Link to Procedure Log   Procedure Log    Hemo Data    Most Recent Value  Fick Cardiac Output  7.71 L/min  Fick Cardiac Output Index 3.34 (L/min)/BSA  Aortic Mean Gradient 51.58 mmHg  Aortic Peak Gradient 62 mmHg  Aortic Valve Area 0.82  Aortic Value Area Index 0.36 cm2/BSA  RA A Wave 13 mmHg  RA V Wave 8 mmHg  RA Mean 8 mmHg  RV Systolic Pressure 42 mmHg  RV Diastolic Pressure 0 mmHg  RV EDP 7 mmHg  PA Systolic Pressure 41 mmHg  PA Diastolic Pressure 9 mmHg  PA Mean 25 mmHg  PW A Wave 19 mmHg  PW V Wave 19 mmHg  PW Mean 17 mmHg  AO Systolic Pressure 604 mmHg  AO Diastolic Pressure 70 mmHg  AO Mean 98 mmHg  LV Systolic Pressure 540 mmHg  LV Diastolic Pressure 17 mmHg  LV EDP 22 mmHg  AOp Systolic Pressure 981 mmHg  AOp Diastolic Pressure 67 mmHg  AOp Mean Pressure 99 mmHg  LVp Systolic Pressure 191 mmHg  LVp Diastolic Pressure 19 mmHg  LVp EDP Pressure 27 mmHg  QP/QS 1  TPVR Index 7.47 HRUI  TSVR Index 29.3 HRUI  PVR SVR Ratio 0.09  TPVR/TSVR Ratio 0.26    Cardiac TAVR CT  TECHNIQUE: The patient was scanned on a Siemens Force 478 slice scanner. A 120 kV retrospective scan was triggered in the descending thoracic aorta at 111 HU's. Gantry rotation speed was 270 msecs and collimation was .9 mm. No beta blockade or nitro were given. The 3D data set was reconstructed in 5% intervals of the R-R cycle. Systolic and diastolic phases were analyzed on a dedicated work station using MPR, MIP and VRT modes. The patient received 80 cc of contrast.  FINDINGS: Aortic Valve: Tri leaflet and calcified with restricted leaflet motion  Aorta: No aneurysm moderate calcific atherosclerosis  Sinotubular Junction: 26 mm  Ascending Thoracic Aorta: 32 mm  Aortic Arch: 24 mm  Descending Thoracic Aorta: 23 mm  Sinus of Valsalva Measurements:  Non-coronary: 28 mm  Right - coronary: 26 mm  Left - coronary: 25.7 mm  Coronary Artery Height above Annulus:  Left Main: 13 mm above annulus  Right Coronary: 13.7 mm above annulus  Virtual Basal Annulus  Measurements:  Maximum/Minimum Diameter: 24.4 mm x 19.9 mm  Perimeter: 70.5 mm  Area: 383 mm2  Coronary Arteries: Sufficient height above annulus for deployment  Optimum Fluoroscopic Angle for Delivery: LAO 23 degrees Caudal 1 degree  IMPRESSION: 1. Calcified tri leaflet AV with annular area of 383 mm 2 suitable for a 23 mm Sapien 3 valve  2.  Coronary arteries sufficient height above annulus for deployment  3.  No LAA thrombus  4. Optimum angiographic angle for deployment LAO 23 degrees Caudal 1 degree  Jenkins Rouge   Electronically Signed   By: Jenkins Rouge M.D.   On: 11/15/2017 08:51   CT ANGIOGRAPHY CHEST, ABDOMEN AND PELVIS  TECHNIQUE: Multidetector CT imaging through the chest, abdomen and pelvis was performed using the standard protocol during bolus administration of intravenous contrast. Multiplanar reconstructed images and MIPs were obtained and reviewed to evaluate the vascular anatomy.  CONTRAST:  163mL OMNIPAQUE IOHEXOL 300 MG/ML  SOLN  COMPARISON:  CT  the abdomen and pelvis 12/24/2015. Chest CT 12/05/2016.  FINDINGS: CTA CHEST FINDINGS  Cardiovascular: Heart size is normal. There is no significant pericardial fluid, thickening or pericardial calcification. There is aortic atherosclerosis, as well as atherosclerosis of the great vessels of the mediastinum and the coronary arteries, including calcified atherosclerotic plaque in the right coronary artery. Severe thickening calcification of the aortic valve. Severe calcifications of the mitral annulus.  Mediastinum/Lymph Nodes: No pathologically enlarged mediastinal or hilar lymph nodes. Esophagus is unremarkable in appearance. No axillary lymphadenopathy.  Lungs/Pleura: Several small pulmonary nodules are scattered throughout the lungs bilaterally. The largest of these is in the left lower lobe anteriorly (axial image 63 of series 8) measuring 12 x 8 mm (mean diameter of 10  mm), stable compared to prior examination 12/05/2016. Multiple other smaller lesions are noted, many of which appear stable compared to the prior examination, but some of which were obscured by motion on the prior study. The largest other nodule is in the right upper lobe (axial image 23 of series 8) measuring 5 mm. No acute consolidative airspace disease. No pleural effusions.  Musculoskeletal/Soft Tissues: There are no aggressive appearing lytic or blastic lesions noted in the visualized portions of the skeleton.  CTA ABDOMEN AND PELVIS FINDINGS  Hepatobiliary: Liver has a shrunken appearance and nodular contour, indicative of underlying cirrhosis. There is also diffuse low attenuation throughout the hepatic parenchyma, indicative of severe hepatic steatosis. No discrete cystic or solid hepatic lesions. No intra or extrahepatic biliary ductal dilatation. Status post cholecystectomy.  Pancreas: No pancreatic mass. No pancreatic ductal dilatation. No pancreatic or peripancreatic fluid or inflammatory changes.  Spleen: Unremarkable.  Adrenals/Urinary Tract: Subcentimeter low-attenuation lesion in the posterior aspect of the interpolar region of the left kidney. Right kidney and bilateral adrenal glands are normal in appearance. No hydroureteronephrosis. Urinary bladder is normal in appearance. Bilateral adrenal glands are normal in appearance.  Stomach/Bowel: Normal appearance of the stomach. No pathologic dilatation of small bowel or colon. The appendix is not confidently identified and may be surgically absent. Regardless, there are no inflammatory changes noted adjacent to the cecum to suggest the presence of an acute appendicitis at this time.  Vascular/Lymphatic: Aortic atherosclerosis, without evidence of aneurysm or dissection in the abdominal or pelvic vasculature. Vascular findings and measurements pertinent to potential TAVR procedure, as detailed below.  Retroaortic left renal vein (normal anatomical variant) incidentally noted. No lymphadenopathy noted in the abdomen or pelvis.  Reproductive: Status post hysterectomy. Ovaries are not confidently identified may be surgically absent or atrophic.  Other: No significant volume of ascites.  No pneumoperitoneum.  Musculoskeletal: There are no aggressive appearing lytic or blastic lesions noted in the visualized portions of the skeleton.  VASCULAR MEASUREMENTS PERTINENT TO TAVR:  AORTA:  Minimal Aortic Diameter-14 x 16 mm  Severity of Aortic Calcification-mild  RIGHT PELVIS:  Right Common Iliac Artery -  Minimal Diameter-10.6 x 9.2 mm  Tortuosity-mild  Calcification-mild  Right External Iliac Artery -  Minimal Diameter-6.5 x 6.7 mm  Tortuosity-mild  Calcification-none  Right Common Femoral Artery -  Minimal Diameter-7.6 x 7.6 mm  Tortuosity-mild  Calcification-none  LEFT PELVIS:  Left Common Iliac Artery -  Minimal Diameter-11.6 x 10.4 mm  Tortuosity-mild  Calcification-mild  Left External Iliac Artery -  Minimal Diameter-6.5 x 6.9 mm  Tortuosity-mild  Calcification-none  Left Common Femoral Artery -  Minimal Diameter-7.7 x 6.4 mm  Tortuosity-mild  Calcification-none  Review of the MIP images confirms the above findings.  IMPRESSION: 1. Vascular  findings and measurements pertinent to potential TAVR procedure, as detailed above. 2. Severe thickening calcification of the aortic valve, compatible with the reported clinical history of severe aortic stenosis. 3. Mild cardiomegaly. 4. Aortic atherosclerosis, in addition to right coronary artery disease. 5. Hepatic steatosis with hepatic cirrhosis. 6. Additional incidental findings, as above.   Electronically Signed   By: Vinnie Langton M.D.   On: 11/14/2017 15:54    Impression:  Patient has stage D severe symptomatic aortic stenosis.  She  presents with worsening symptoms of exertional shortness of breath, intermittent episodes of resting shortness of breath and PND, consistent with chronic diastolic congestive heart failure, New [place] Heart Association functional class IIIb.  She has also had 2 frank syncopal episodes.  Transthoracic echocardiogram documents presence of severe aortic stenosis with severe thickening, calcification, and restricted leaflet mobility involving all 3 leaflets of the aortic valve.  Peak velocity across aortic valve measured greater than 4.1 m/s.  Left ventricular systolic function remains normal.  Diagnostic cardiac catheterization confirmed the presence of severe aortic stenosis with mean transvalvular gradient measured greater than 50 mmHg.  Patient does not have significant coronary artery disease.  There is no question the patient would benefit from aortic valve replacement.  Risks associated with conventional surgery would be at least moderately elevated because of the patient's extreme morbid obesity, numerous comorbid medical problems, and limited physical mobility.  Moreover, the patient has relatively small size aortic annulus and aortic root.  Aortic root enlargement or root replacement would be necessary to avoid the development of patient prosthesis mismatch with conventional surgery.  I would be very reluctant to consider this patient a candidate for conventional surgical aortic valve replacement.  Cardiac-gated CTA of the heart reveals anatomical characteristics consistent with aortic stenosis suitable for treatment by transcatheter aortic valve replacement without any significant complicating features other than the relatively small size of her aortic annulus.  She might be at risk for having a significant residual transvalvular gradient even following successful transcatheter aortic valve replacement.  CTA of the aorta and iliac vessels demonstrate what appears to be adequate pelvic vascular access to  facilitate a transfemoral approach, although her extreme morbid obesity may make transfemoral access challenging.    Plan:  The patient and her brother were counseled at length regarding treatment alternatives for management of severe symptomatic aortic stenosis. Alternative approaches such as conventional aortic valve replacement, transcatheter aortic valve replacement, and continued medical therapy without intervention were compared and contrasted at length.  The risks associated with conventional surgical aortic valve replacement were discussed in detail, as were expectations for post-operative convalescence, and why I would be reluctant to consider this patient a candidate for conventional surgery.  Issues specific to transcatheter aortic valve replacement were discussed including questions about long term valve durability, the potential for paravalvular leak, possible increased risk of need for permanent pacemaker placement, and other technical complications related to the procedure itself.  Long-term prognosis with medical therapy was discussed. This discussion was placed in the context of the patient's own specific clinical presentation and past medical history.  All of their questions have been addressed.  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  We tentatively plan for surgery on December 06, 2017.  Following the decision to proceed with transcatheter aortic valve replacement, a discussion has been held regarding what types of management strategies would be attempted intraoperatively in the event of life-threatening complications, including whether or not the patient  would be considered a candidate for the use of cardiopulmonary bypass and/or conversion to open sternotomy for attempted surgical intervention.  The patient has been advised of a variety of complications that might develop including but not limited to risks of death, stroke,  paravalvular leak, aortic dissection or other major vascular complications, aortic annulus rupture, device embolization, cardiac rupture or perforation, mitral regurgitation, acute myocardial infarction, arrhythmia, heart block or bradycardia requiring permanent pacemaker placement, congestive heart failure, respiratory failure, renal failure, pneumonia, infection, other late complications related to structural valve deterioration or migration, or other complications that might ultimately cause a temporary or permanent loss of functional independence or other long term morbidity.  The patient provides full informed consent for the procedure as described and all questions were answered.    I spent in excess of 90 minutes during the conduct of this office consultation and >50% of this time involved direct face-to-face encounter with the patient for counseling and/or coordination of their care.    Valentina Gu. Roxy Manns, MD 11/23/2017 4:06 PM

## 2017-11-23 NOTE — Progress Notes (Signed)
HEART AND [place] SURGERY CONSULTATION REPORT  Referring Provider is Jacolyn Reedy, MD PCP is Leonard Downing, MD  Chief Complaint  Patient presents with  . Aortic Stenosis    Surgical eval for TAVR review all studies..she still needs PFT scheduled    HPI:  Patient is a 77 year old morbidly obese female with history of aortic stenosis, hypertension, hyperlipidemia, type 2 diabetes mellitus without complications, fibromyalgia, fatty liver disease, and limited physical mobility who has been referred for surgical consultation to discuss treatment options for management of severe symptomatic stenosis.  Patient was noted to have a heart murmur on physical exam several years ago by her primary care physician.  She was diagnosed with aortic stenosis and referred to Dr. Wynonia Lawman who has been following her carefully ever since.  Echocardiograms have demonstrated a gradual progression in the severity of aortic stenosis.  She was referred to the multidisciplinary heart valve clinic and evaluated previously by Dr. Burt Knack.  At that time her aortic stenosis was moderate, bordering on severe the patient had no clear symptoms attributable to her aortic stenosis.  Continued close follow-up was recommended.  Over the past year the patient has developed worsening exertional shortness of breath and generalized weakness.  She has become progressively less mobile.  Over the past 3 months she had 2 frank syncopal episodes.  She was seen in follow-up by Dr. Wynonia Lawman and repeat echocardiogram revealed normal left ventricular systolic function with severe aortic stenosis.  Peak velocity across aortic valve measured 4.1 m/s corresponding to mean transvalvular gradient estimated 36 mmHg.  She was referred back to Dr. Burt Knack and left and right heart catheterization was performed November 02, 2017.  Catheterization confirmed the presence of severe aortic  stenosis with mean transvalvular gradient measured 51.6 mmHg corresponding to aortic valve area calculated 0.82 cm.  The patient was noted to have mild nonobstructive coronary artery disease.  There was mild pulmonary hypertension.  CT angiography was performed and the patient was referred for surgical consultation.  The patient is widowed and lives alone in [place].  She is accompanied by her brother for her office consultation today.  She has an adult son who lives close by and is reportedly supportive.  The patient admits that she lives a very sedentary lifestyle.  She states that she really cannot get around very well.  She walks using a cane for support.  She has had several dizzy spells and feels generalized weakness.  She had 2 frank syncopal episodes.  She gets short of breath with low-level activity.  She reports occasional resting shortness of breath, PND, and orthopnea.  She has some mild lower extremity edema.  She has not had chest pain or chest tightness either with activity or at rest.  Past Medical History:  Diagnosis Date  . Anxiety    self reported  . Diabetes mellitus without complication ([place])   . Diarrhea   . Diverticulosis of colon (without mention of hemorrhage)   . Esophageal reflux   . External hemorrhoids without mention of complication   . Fatty liver disease, nonalcoholic   . Fibromyalgia   . Hyperlipemia   . Hypertension   . IBS (irritable bowel syndrome)   . Obesity, unspecified   . Personal history of colonic polyps    adenomatous & TUBULAR ADENOMA  . Psoriasis     Past Surgical History:  Procedure Laterality Date  . APPENDECTOMY    . BACK SURGERY    .  CHOLECYSTECTOMY    . LEFT AND RIGHT HEART CATHETERIZATION WITH CORONARY ANGIOGRAM N/A 11/15/2013   Procedure: LEFT AND RIGHT HEART CATHETERIZATION WITH CORONARY ANGIOGRAM;  Surgeon: Blane Ohara, MD;  Location: [facility] CATH LAB;  Service: Cardiovascular;  Laterality: N/A;  . RIGHT/LEFT HEART  CATH AND CORONARY ANGIOGRAPHY N/A 11/02/2017   Procedure: RIGHT/LEFT HEART CATH AND CORONARY ANGIOGRAPHY;  Surgeon: Burnell Blanks, MD;  Location: [facility] CV LAB;  Service: Cardiovascular;  Laterality: N/A;  . VAGINAL HYSTERECTOMY      Family History  Problem Relation Age of Onset  . Lung cancer Mother   . Diabetes Maternal Grandmother   . Heart disease Maternal Grandfather   . Colon cancer Neg Hx     Social History   Socioeconomic History  . Marital status: Widowed    Spouse name: Not on file  . Number of children: Not on file  . Years of education: Not on file  . Highest education level: Not on file  Occupational History  . Not on file  Social Needs  . Financial resource strain: Not on file  . Food insecurity:    Worry: Not on file    Inability: Not on file  . Transportation needs:    Medical: Not on file    Non-medical: Not on file  Tobacco Use  . Smoking status: Former Research scientist (life sciences)  . Smokeless tobacco: Never Used  Substance and Sexual Activity  . Alcohol use: No  . Drug use: No  . Sexual activity: Not on file  Lifestyle  . Physical activity:    Days per week: Not on file    Minutes per session: Not on file  . Stress: Not on file  Relationships  . Social connections:    Talks on phone: Not on file    Gets together: Not on file    Attends religious service: Not on file    Active member of club or organization: Not on file    Attends meetings of clubs or organizations: Not on file    Relationship status: Not on file  . Intimate partner violence:    Fear of current or ex partner: Not on file    Emotionally abused: Not on file    Physically abused: Not on file    Forced sexual activity: Not on file  Other Topics Concern  . Not on file  Social History Narrative  . Not on file    Current Outpatient Medications  Medication Sig Dispense Refill  . aspirin EC 81 MG tablet Take 81 mg by mouth once.    . Aspirin-Salicylamide-Caffeine (BC HEADACHE POWDER  PO) Take 1 packet by mouth daily as needed (back pain).    Marland Kitchen atorvastatin (LIPITOR) 40 MG tablet Take 40 mg by mouth daily.    . Cholecalciferol (VITAMIN D) 2000 units CAPS Take 2,000 Units by mouth daily.     . Cranberry 250 MG TABS Take 1 tablet by mouth daily.    . cyclobenzaprine (FLEXERIL) 10 MG tablet Take 10 mg by mouth 3 (three) times daily as needed for muscle spasms.    . enalapril (VASOTEC) 10 MG tablet Take 10 mg by mouth daily.    . furosemide (LASIX) 80 MG tablet Take 80 mg by mouth 3 (three) times daily.     Marland Kitchen glimepiride (AMARYL) 4 MG tablet Take 8 mg by mouth daily with breakfast.     . metFORMIN (GLUMETZA) 1000 MG (MOD) 24 hr tablet Take 1,000 mg by mouth 2 (two)  times daily with a meal.     . metoprolol tartrate (LOPRESSOR) 50 MG tablet Take 1 tablet by mouth 1 hour prior to TAVR CT scans 1 tablet 0  . Multiple Vitamin (MULTIVITAMIN WITH MINERALS) TABS tablet Take 1 tablet by mouth daily.    Marland Kitchen POTASSIUM PO Take 1 tablet by mouth daily as needed (when taking a fluid pill).    . traMADol (ULTRAM) 50 MG tablet Take 50-100 mg by mouth every 6 (six) hours as needed for moderate pain.    . vitamin E 400 UNIT capsule Take 800 Units by mouth daily.     No current facility-administered medications for this visit.     Allergies  Allergen Reactions  . Sulfonamide Derivatives Anaphylaxis  . Amoxicillin Diarrhea  . Castor Oil     "passed out"  . Demerol [Meperidine] Nausea And Vomiting  . Hydrocodone Nausea And Vomiting      Review of Systems:   General:  normal appetite, decreased energy, no weight gain, no weight loss, no fever  Cardiac:  no chest pain with exertion, no chest pain at rest, +SOB with exertion, occasional resting SOB, + PND, + orthopnea, no palpitations, no arrhythmia, no atrial fibrillation, + LE edema, + dizzy spells, + syncope  Respiratory:  + shortness of breath, no home oxygen, no productive cough, no dry cough, no bronchitis, no wheezing, no hemoptysis,  no asthma, no pain with inspiration or cough, no sleep apnea, no CPAP at night  GI:   no difficulty swallowing, no reflux, no frequent heartburn, no hiatal hernia, no abdominal pain, no constipation, no diarrhea, no hematochezia, no hematemesis, no melena  GU:   + dysuria,  + frequency, + recent urinary tract infection, no hematuria, no kidney stones, no kidney disease  Vascular:  no pain suggestive of claudication, no pain in feet, no leg cramps, no varicose veins, no DVT, no non-healing foot ulcer  Neuro:   no stroke, no TIA's, no seizures, no headaches, no temporary blindness one eye,  no slurred speech, no peripheral neuropathy, no chronic pain, + instability of gait, no memory/cognitive dysfunction  Musculoskeletal: + arthritis, no joint swelling, no myalgias, + difficulty walking, limited mobility   Skin:   + rash, + itching, no skin infections, no pressure sores or ulcerations  Psych:   + anxiety, + depression, + nervousness, no unusual recent stress  Eyes:   no blurry vision, no floaters, no recent vision changes, + wears glasses or contacts  ENT:   + hearing loss, no loose or painful teeth, no dentures, last saw dentist within the past year  Hematologic:  no easy bruising, no abnormal bleeding, no clotting disorder, no frequent epistaxis  Endocrine:  + diabetes, does check CBG's at home           Physical Exam:   BP (!) 107/57   Pulse 82   Resp 20   Ht 5\' 2"  (1.575 m)   Wt 300 lb (136.1 kg)   SpO2 98% Comment: RA  BMI 54.87 kg/m   General:  Morbidly obese female NAD    HEENT:  Unremarkable   Neck:   no JVD, no bruits, no adenopathy   Chest:   clear to auscultation, symmetrical breath sounds, no wheezes, no rhonchi   CV:   RRR, grade III/VI crescendo/decrescendo murmur heard best at RSB,  no diastolic murmur  Abdomen:  soft, non-tender, no masses, very large paniculus  Extremities:  warm, well-perfused, pulses not palpable, + LE edema  Rectal/GU  Deferred  Neuro:   Grossly  non-focal and symmetrical throughout  Skin:   Clean and dry, no rashes, no breakdown   Diagnostic Tests:  TRANSTHORACIC ECHOCARDIOGRAM  Report from transthoracic echocardiogram performed October 03, 2017 at Dr. Thurman Coyer office is reviewed.  Left ventricular cavity size was normal.  There was moderate concentric left ventricular hypertrophy.  Left ventricular systolic function was mildly decreased with ejection fraction estimated 50%.  There was severe left atrial enlargement.  There was severe aortic stenosis.  Peak velocity across the aortic valve measured 4.12 m/s corresponding to mean transvalvular gradient estimated 34 mmHg.  Aortic valve area was calculated 0.79 cm.  DVI was not reported.  The leaflets were thickened with severely restricted leaflet mobility.  There was mild mitral regurgitation and trace tricuspid regurgitation.   RIGHT/LEFT HEART CATH AND CORONARY ANGIOGRAPHY  Conclusion     Mid RCA lesion is 10% stenosed.  Prox LAD to Mid LAD lesion is 10% stenosed.   1. Mild non-obstructive CAD 2. Severe aortic stenosis (peak to peak gradient 62 mmHg, mean gradient 51.6 mmHg, AVA 0.82 cm2)  Recommendations: Will continue workup for TAVR.     Indications   Severe aortic stenosis [I35.0 (ICD-10-CM)]  Procedural Details/Technique   Technical Details Indication: 77 yo female with morbid obesity, HTN, HLD and severe aortic stenosis.   Procedure: The risks, benefits, complications, treatment options, and expected outcomes were discussed with the patient. The patient and/or family concurred with the proposed plan, giving informed consent. The patient was brought to the cath lab after IV hydration was given. The patient was further sedated with Versed and Fentanyl. The IV catheter in the right antecubital vein was prepped and draped. I then changed out this IV catheter for a 5 French sheath. Right heart catheterization performed with a balloon tipped catheter. The left wrist was  prepped and draped in a sterile fashion. 1% lidocaine was used for local anesthesia. Using the modified Seldinger access technique, a 5 French sheath was placed in the left radial artery. 3 mg Verapamil was given through the sheath. 6000 units IV heparin was given. Standard diagnostic catheters were used to perform selective coronary angiography. I crossed the aortic valve with an AL-2 catheter and a straight wire. LV pressures measured. The sheath was removed from the left radial artery and a Terumo hemostasis band was applied at the arteriotomy site on the left wrist.     Estimated blood loss <50 mL.  During this procedure the patient was administered the following to achieve and maintain moderate conscious sedation: Versed 2 mg, Fentanyl 100 mcg, while the patient's heart rate, blood pressure, and oxygen saturation were continuously monitored. The period of conscious sedation was 43 minutes, of which I was present face-to-face 100% of this time.  Complications   Complications documented before study signed (11/02/2017 11:38 AM EDT)    RIGHT/LEFT HEART CATH AND CORONARY ANGIOGRAPHY   None Documented by Burnell Blanks, MD 11/02/2017 11:27 AM EDT  Time Range: Intraprocedure      Coronary Findings   Diagnostic  Dominance: Right  Left Anterior Descending  Vessel is large.  Prox LAD to Mid LAD lesion 10% stenosed  Prox LAD to Mid LAD lesion is 10% stenosed.  First Diagonal Branch  Vessel is moderate in size.  Left Circumflex  First Obtuse Marginal Branch  Right Coronary Artery  Vessel is large.  Mid RCA lesion 10% stenosed  Mid RCA lesion is 10% stenosed.  Intervention   No  interventions have been documented.  Coronary Diagrams   Diagnostic Diagram       Implants    No implant documentation for this case.  MERGE Images   Show images for CARDIAC CATHETERIZATION   Link to Procedure Log   Procedure Log    Hemo Data    Most Recent Value  Fick Cardiac Output  7.71 L/min  Fick Cardiac Output Index 3.34 (L/min)/BSA  Aortic Mean Gradient 51.58 mmHg  Aortic Peak Gradient 62 mmHg  Aortic Valve Area 0.82  Aortic Value Area Index 0.36 cm2/BSA  RA A Wave 13 mmHg  RA V Wave 8 mmHg  RA Mean 8 mmHg  RV Systolic Pressure 42 mmHg  RV Diastolic Pressure 0 mmHg  RV EDP 7 mmHg  PA Systolic Pressure 41 mmHg  PA Diastolic Pressure 9 mmHg  PA Mean 25 mmHg  PW A Wave 19 mmHg  PW V Wave 19 mmHg  PW Mean 17 mmHg  AO Systolic Pressure 397 mmHg  AO Diastolic Pressure 70 mmHg  AO Mean 98 mmHg  LV Systolic Pressure 673 mmHg  LV Diastolic Pressure 17 mmHg  LV EDP 22 mmHg  AOp Systolic Pressure 419 mmHg  AOp Diastolic Pressure 67 mmHg  AOp Mean Pressure 99 mmHg  LVp Systolic Pressure 379 mmHg  LVp Diastolic Pressure 19 mmHg  LVp EDP Pressure 27 mmHg  QP/QS 1  TPVR Index 7.47 HRUI  TSVR Index 29.3 HRUI  PVR SVR Ratio 0.09  TPVR/TSVR Ratio 0.26    Cardiac TAVR CT  TECHNIQUE: The patient was scanned on a Siemens Force 024 slice scanner. A 120 kV retrospective scan was triggered in the descending thoracic aorta at 111 HU's. Gantry rotation speed was 270 msecs and collimation was .9 mm. No beta blockade or nitro were given. The 3D data set was reconstructed in 5% intervals of the R-R cycle. Systolic and diastolic phases were analyzed on a dedicated work station using MPR, MIP and VRT modes. The patient received 80 cc of contrast.  FINDINGS: Aortic Valve: Tri leaflet and calcified with restricted leaflet motion  Aorta: No aneurysm moderate calcific atherosclerosis  Sinotubular Junction: 26 mm  Ascending Thoracic Aorta: 32 mm  Aortic Arch: 24 mm  Descending Thoracic Aorta: 23 mm  Sinus of Valsalva Measurements:  Non-coronary: 28 mm  Right - coronary: 26 mm  Left - coronary: 25.7 mm  Coronary Artery Height above Annulus:  Left Main: 13 mm above annulus  Right Coronary: 13.7 mm above annulus  Virtual Basal Annulus  Measurements:  Maximum/Minimum Diameter: 24.4 mm x 19.9 mm  Perimeter: 70.5 mm  Area: 383 mm2  Coronary Arteries: Sufficient height above annulus for deployment  Optimum Fluoroscopic Angle for Delivery: LAO 23 degrees Caudal 1 degree  IMPRESSION: 1. Calcified tri leaflet AV with annular area of 383 mm 2 suitable for a 23 mm Sapien 3 valve  2.  Coronary arteries sufficient height above annulus for deployment  3.  No LAA thrombus  4. Optimum angiographic angle for deployment LAO 23 degrees Caudal 1 degree  Jenkins Rouge   Electronically Signed   By: Jenkins Rouge M.D.   On: 11/15/2017 08:51   CT ANGIOGRAPHY CHEST, ABDOMEN AND PELVIS  TECHNIQUE: Multidetector CT imaging through the chest, abdomen and pelvis was performed using the standard protocol during bolus administration of intravenous contrast. Multiplanar reconstructed images and MIPs were obtained and reviewed to evaluate the vascular anatomy.  CONTRAST:  161mL OMNIPAQUE IOHEXOL 300 MG/ML  SOLN  COMPARISON:  CT  the abdomen and pelvis 12/24/2015. Chest CT 12/05/2016.  FINDINGS: CTA CHEST FINDINGS  Cardiovascular: Heart size is normal. There is no significant pericardial fluid, thickening or pericardial calcification. There is aortic atherosclerosis, as well as atherosclerosis of the great vessels of the mediastinum and the coronary arteries, including calcified atherosclerotic plaque in the right coronary artery. Severe thickening calcification of the aortic valve. Severe calcifications of the mitral annulus.  Mediastinum/Lymph Nodes: No pathologically enlarged mediastinal or hilar lymph nodes. Esophagus is unremarkable in appearance. No axillary lymphadenopathy.  Lungs/Pleura: Several small pulmonary nodules are scattered throughout the lungs bilaterally. The largest of these is in the left lower lobe anteriorly (axial image 63 of series 8) measuring 12 x 8 mm (mean diameter of 10  mm), stable compared to prior examination 12/05/2016. Multiple other smaller lesions are noted, many of which appear stable compared to the prior examination, but some of which were obscured by motion on the prior study. The largest other nodule is in the right upper lobe (axial image 23 of series 8) measuring 5 mm. No acute consolidative airspace disease. No pleural effusions.  Musculoskeletal/Soft Tissues: There are no aggressive appearing lytic or blastic lesions noted in the visualized portions of the skeleton.  CTA ABDOMEN AND PELVIS FINDINGS  Hepatobiliary: Liver has a shrunken appearance and nodular contour, indicative of underlying cirrhosis. There is also diffuse low attenuation throughout the hepatic parenchyma, indicative of severe hepatic steatosis. No discrete cystic or solid hepatic lesions. No intra or extrahepatic biliary ductal dilatation. Status post cholecystectomy.  Pancreas: No pancreatic mass. No pancreatic ductal dilatation. No pancreatic or peripancreatic fluid or inflammatory changes.  Spleen: Unremarkable.  Adrenals/Urinary Tract: Subcentimeter low-attenuation lesion in the posterior aspect of the interpolar region of the left kidney. Right kidney and bilateral adrenal glands are normal in appearance. No hydroureteronephrosis. Urinary bladder is normal in appearance. Bilateral adrenal glands are normal in appearance.  Stomach/Bowel: Normal appearance of the stomach. No pathologic dilatation of small bowel or colon. The appendix is not confidently identified and may be surgically absent. Regardless, there are no inflammatory changes noted adjacent to the cecum to suggest the presence of an acute appendicitis at this time.  Vascular/Lymphatic: Aortic atherosclerosis, without evidence of aneurysm or dissection in the abdominal or pelvic vasculature. Vascular findings and measurements pertinent to potential TAVR procedure, as detailed below.  Retroaortic left renal vein (normal anatomical variant) incidentally noted. No lymphadenopathy noted in the abdomen or pelvis.  Reproductive: Status post hysterectomy. Ovaries are not confidently identified may be surgically absent or atrophic.  Other: No significant volume of ascites.  No pneumoperitoneum.  Musculoskeletal: There are no aggressive appearing lytic or blastic lesions noted in the visualized portions of the skeleton.  VASCULAR MEASUREMENTS PERTINENT TO TAVR:  AORTA:  Minimal Aortic Diameter-14 x 16 mm  Severity of Aortic Calcification-mild  RIGHT PELVIS:  Right Common Iliac Artery -  Minimal Diameter-10.6 x 9.2 mm  Tortuosity-mild  Calcification-mild  Right External Iliac Artery -  Minimal Diameter-6.5 x 6.7 mm  Tortuosity-mild  Calcification-none  Right Common Femoral Artery -  Minimal Diameter-7.6 x 7.6 mm  Tortuosity-mild  Calcification-none  LEFT PELVIS:  Left Common Iliac Artery -  Minimal Diameter-11.6 x 10.4 mm  Tortuosity-mild  Calcification-mild  Left External Iliac Artery -  Minimal Diameter-6.5 x 6.9 mm  Tortuosity-mild  Calcification-none  Left Common Femoral Artery -  Minimal Diameter-7.7 x 6.4 mm  Tortuosity-mild  Calcification-none  Review of the MIP images confirms the above findings.  IMPRESSION: 1. Vascular  findings and measurements pertinent to potential TAVR procedure, as detailed above. 2. Severe thickening calcification of the aortic valve, compatible with the reported clinical history of severe aortic stenosis. 3. Mild cardiomegaly. 4. Aortic atherosclerosis, in addition to right coronary artery disease. 5. Hepatic steatosis with hepatic cirrhosis. 6. Additional incidental findings, as above.   Electronically Signed   By: Vinnie Langton M.D.   On: 11/14/2017 15:54    Impression:  Patient has stage D severe symptomatic aortic stenosis.  She  presents with worsening symptoms of exertional shortness of breath, intermittent episodes of resting shortness of breath and PND, consistent with chronic diastolic congestive heart failure, [place] Heart Association functional class IIIb.  She has also had 2 frank syncopal episodes.  Transthoracic echocardiogram documents presence of severe aortic stenosis with severe thickening, calcification, and restricted leaflet mobility involving all 3 leaflets of the aortic valve.  Peak velocity across aortic valve measured greater than 4.1 m/s.  Left ventricular systolic function remains normal.  Diagnostic cardiac catheterization confirmed the presence of severe aortic stenosis with mean transvalvular gradient measured greater than 50 mmHg.  Patient does not have significant coronary artery disease.  There is no question the patient would benefit from aortic valve replacement.  Risks associated with conventional surgery would be at least moderately elevated because of the patient's extreme morbid obesity, numerous comorbid medical problems, and limited physical mobility.  Moreover, the patient has relatively small size aortic annulus and aortic root.  Aortic root enlargement or root replacement would be necessary to avoid the development of patient prosthesis mismatch with conventional surgery.  I would be very reluctant to consider this patient a candidate for conventional surgical aortic valve replacement.  Cardiac-gated CTA of the heart reveals anatomical characteristics consistent with aortic stenosis suitable for treatment by transcatheter aortic valve replacement without any significant complicating features other than the relatively small size of her aortic annulus.  She might be at risk for having a significant residual transvalvular gradient even following successful transcatheter aortic valve replacement.  CTA of the aorta and iliac vessels demonstrate what appears to be adequate pelvic vascular access to  facilitate a transfemoral approach, although her extreme morbid obesity may make transfemoral access challenging.    Plan:  The patient and her brother were counseled at length regarding treatment alternatives for management of severe symptomatic aortic stenosis. Alternative approaches such as conventional aortic valve replacement, transcatheter aortic valve replacement, and continued medical therapy without intervention were compared and contrasted at length.  The risks associated with conventional surgical aortic valve replacement were discussed in detail, as were expectations for post-operative convalescence, and why I would be reluctant to consider this patient a candidate for conventional surgery.  Issues specific to transcatheter aortic valve replacement were discussed including questions about long term valve durability, the potential for paravalvular leak, possible increased risk of need for permanent pacemaker placement, and other technical complications related to the procedure itself.  Long-term prognosis with medical therapy was discussed. This discussion was placed in the context of the patient's own specific clinical presentation and past medical history.  All of their questions have been addressed.  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  We tentatively plan for surgery on December 06, 2017.  Following the decision to proceed with transcatheter aortic valve replacement, a discussion has been held regarding what types of management strategies would be attempted intraoperatively in the event of life-threatening complications, including whether or not the patient  would be considered a candidate for the use of cardiopulmonary bypass and/or conversion to open sternotomy for attempted surgical intervention.  The patient has been advised of a variety of complications that might develop including but not limited to risks of death, stroke,  paravalvular leak, aortic dissection or other major vascular complications, aortic annulus rupture, device embolization, cardiac rupture or perforation, mitral regurgitation, acute myocardial infarction, arrhythmia, heart block or bradycardia requiring permanent pacemaker placement, congestive heart failure, respiratory failure, renal failure, pneumonia, infection, other late complications related to structural valve deterioration or migration, or other complications that might ultimately cause a temporary or permanent loss of functional independence or other long term morbidity.  The patient provides full informed consent for the procedure as described and all questions were answered.    I spent in excess of 90 minutes during the conduct of this office consultation and >50% of this time involved direct face-to-face encounter with the patient for counseling and/or coordination of their care.    Valentina Gu. Roxy Manns, MD 11/23/2017 4:06 PM

## 2017-11-23 NOTE — Patient Instructions (Signed)
Continue all previous medications without any changes at this time  

## 2017-11-23 NOTE — Therapy (Signed)
[place], [place], [address] Phone: [phone]   Fax:  [phone]  Physical Therapy Evaluation  Patient Details  Name: [name] MRN: [id number] Date of Birth: 1940-05-24 Referring Provider (PT): [name] MD   Encounter Date: 11/23/2017  PT End of Session - 11/23/17 1456    Visit Number  1    Number of Visits  1    Date for PT Re-Evaluation  11/23/17    PT Start Time  3976    PT Stop Time  1456    PT Time Calculation (min)  51 min    Activity Tolerance  Patient tolerated treatment well    Behavior During Therapy  [facility] for tasks assessed/performed       Past Medical History:  Diagnosis Date  . Anxiety    self reported  . Diabetes mellitus without complication ([place])   . Diarrhea   . Diverticulosis of colon (without mention of hemorrhage)   . Esophageal reflux   . External hemorrhoids without mention of complication   . Fatty liver disease, nonalcoholic   . Fibromyalgia   . Hyperlipemia   . Hypertension   . IBS (irritable bowel syndrome)   . Obesity, unspecified   . Personal history of colonic polyps    adenomatous & TUBULAR ADENOMA  . Psoriasis     Past Surgical History:  Procedure Laterality Date  . APPENDECTOMY    . BACK SURGERY    . CHOLECYSTECTOMY    . LEFT AND RIGHT HEART CATHETERIZATION WITH CORONARY ANGIOGRAM N/A 11/15/2013   Procedure: LEFT AND RIGHT HEART CATHETERIZATION WITH CORONARY ANGIOGRAM;  Surgeon: Blane Ohara, MD;  Location: [facility] CATH LAB;  Service: Cardiovascular;  Laterality: N/A;  . RIGHT/LEFT HEART CATH AND CORONARY ANGIOGRAPHY N/A 11/02/2017   Procedure: RIGHT/LEFT HEART CATH AND CORONARY ANGIOGRAPHY;  Surgeon: Burnell Blanks, MD;  Location: [place] CV LAB;  Service: Cardiovascular;  Laterality: N/A;  . VAGINAL HYSTERECTOMY      There were no vitals filed for this visit.   Subjective Assessment - 11/23/17 1415    Subjective  pt is a 77  y.o F with CC of SOB and general fatigue that has been going on for over 1-2 years. She reports it has been progressively getting worse and reports she had 2 times when she passed out in the last 3 months. she denies any chest pain or tightness but reports hx or back pain that has been giving her issues but no problems today.     How long can you sit comfortably?  couple hours    How long can you stand comfortably?  10 min     How long can you walk comfortably?  10 min     Patient Stated Goals  to get heart better    Currently in Pain?  No/denies         [facility] PT Assessment - 11/23/17 1418      Assessment   Medical Diagnosis  Severe Aortic Stenosis    Referring Provider (PT)  [name] MD    Onset Date/Surgical Date  --   1-2 years   Hand Dominance  Right      Precautions   Precautions  None      Restrictions   Weight Bearing Restrictions  No      Balance Screen   Has the patient fallen in the past 6 months  Yes    How many times?  2  Has the patient had a decrease in activity level because of a fear of falling?   Yes    Is the patient reluctant to leave their home because of a fear of falling?   Yes      [place]  Private residence    Living Arrangements  Alone   son lives about 100 ft away   Available Help at Discharge  Family;Available PRN/intermittently    Type of Home  House    Home Access  Stairs to enter    Entrance Stairs-Number of Steps  3    Entrance Stairs-Rails  Left   ascending   Home Layout  Two level   pt stays primarily on the first floor. avoids stairs   Alternate Level Stairs-Number of Steps  15    Alternate Level Stairs-Rails  Can reach both      Cognition   Overall Cognitive Status  Within Functional Limits for tasks assessed      Observation/Other Assessments   Observations  increased RLE swelling noted compared bil      Posture/Postural Control   Posture/Postural Control  Postural limitations      ROM /  Strength   AROM / PROM / Strength  AROM;Strength      AROM   Overall AROM   Within functional limits for tasks performed    Overall AROM Comments  pt noted some soreness in the R shoudler with reaching behind the back      Strength   Overall Strength  Within functional limits for tasks performed    Strength Assessment Site  Hand    Right Hand Grip (lbs)  44    Left Hand Grip (lbs)  40      Ambulation/Gait   Ambulation/Gait  Yes    Assistive device  Straight cane    Gait Pattern  Step-through pattern;Decreased stride length;Trendelenburg;Antalgic;Trunk flexed;Narrow base of support    Gait Comments  pt ambulated 185 ft in 1:48 requiring rest break for 3 min with O2 sat at 95% and HR 106 BPM, she ambulated an additional 109 ft with  and O2 Sat at 94%107 HR      6 Minute Walk- Baseline   6 Minute Walk- Baseline  yes    BP (mmHg)  136/83    HR (bpm)  79    02 Sat (%RA)  98 %    Modified Borg Scale for Dyspnea  2- Mild shortness of breath    Perceived Rate of Exertion (Borg)  6-      6 Minute walk- Post Test   6 Minute Walk Post Test  yes    BP (mmHg)  (!) 137/93    HR (bpm)  107    02 Sat (%RA)  94 %    Modified Borg Scale for Dyspnea  1- Very mild shortness of breath    Perceived Rate of Exertion (Borg)  11- Fairly light      6 minute walk test results    Aerobic Endurance Distance Walked  294    Endurance additional comments  pt demonstrate 80.97% limitation compared to age related norm       [facility] Pre-Surgical Assessment - 11/23/17 0001    5 Meter Walk Test- trial 1  7 sec    5 Meter Walk Test- trial 2  8 sec.     5 Meter Walk Test- trial 3  8 sec.    5 meter walk test average  7.67 sec  4 Stage Balance Test tolerated for:   8 sec.    4 Stage Balance Test Position  2    Comment  unable to stand without use of hands    ADL/IADL Independent with:  Bathing;Dressing    ADL/IADL Needs Assistance with:  Meal prep;Finances;Yard work    ADL/IADL Therapist, sports Index  Moderately  frail    6 Minute Walk- Baseline  yes    BP (mmHg)  136/83    HR (bpm)  79    02 Sat (%RA)  98 %    Modified Borg Scale for Dyspnea  2- Mild shortness of breath    Perceived Rate of Exertion (Borg)  6-    6 Minute Walk Post Test  yes    BP (mmHg)  (!) 137/93    HR (bpm)  104    02 Sat (%RA)  94 %    Modified Borg Scale for Dyspnea  1- Very mild shortness of breath    Perceived Rate of Exertion (Borg)  11- Fairly light    Aerobic Endurance Distance Walked  294    Endurance additional comments  pt demonstrate 80.97% limitation compared to age related norm              Objective measurements completed on examination: See above findings.                           Plan - 11/23/17 1457    Clinical Impression Statement  see assessment in note    Clinical Presentation  Stable    Clinical Decision Making  Low    PT Frequency  One time visit    PT Next Visit Plan  one time TAVR evaluation    Consulted and Agree with Plan of Care  Patient        Clinical Impression Statement: Pt is a 77 yo F presenting to OP PT for evaluation prior to possible TAVR surgery due to severe aortic stenosis. Pt reports onset of SOB and general fatigue approximately 1-2 years ago. Symptoms are limiting endurance. Pt presents with functional ROM and strength, limited balance and is significant at high fall risk 4 stage balance test, poor walking speed and poor aerobic endurance per 6 minute walk test. pt ambulated 185 ft in 1:48 requiring rest break for 3 min with O2 sat at 95% and HR 106 BPM, she ambulated an additional 109 ft with  and O2 Sat at 94%107 HR. Pt reported 1/10 shortness of breath on modified scale for dyspnea. Pt ambulated a total of 294 feet in 6 minute walk. SOB and fatigue increased significantly with 6 minute walk test. Based on the Short Physical Performance Battery, patient has a frailty rating of 4/12 with </= 5/12 considered frail.    Patient demonstrated  the  following deficits and impairments:     Visit Diagnosis: Other abnormalities of gait and mobility     Problem List Patient Active Problem List   Diagnosis Date Noted  . Severe aortic stenosis   . 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 DYSPHAGIA UNSPECIFIED 08/22/2007  . EXOGENOUS OBESITY 07/18/2007  . GERD 07/18/2007  . FATTY LIVER DISEASE 07/18/2007  . DYSPHAGIA 07/18/2007  . DIARRHEA, CHRONIC 07/18/2007  . COLONIC POLYPS, ADENOMATOUS 07/27/2004  . EXTERNAL HEMORRHOIDS 07/27/2004  . DIVERTICULOSIS, MILD 07/27/2004   [place]arr [place] PT, DPT, LAT, ATC  11/23/17  3:00 PM      [facility] [address] [place], [place], [address] Phone: [phone]   Fax:  [phone]  Name: [name] MRN: [id number] Date of Birth: 01-28-1941

## 2017-11-24 ENCOUNTER — Other Ambulatory Visit: Payer: Self-pay

## 2017-11-24 DIAGNOSIS — I350 Nonrheumatic aortic (valve) stenosis: Secondary | ICD-10-CM

## 2017-12-01 NOTE — Pre-Procedure Instructions (Signed)
[name]  12/01/2017      [facility] [address] [place] [address] Phone: [phone] Fax: [phone]  [id number] [place], [place] [address] Phone: [phone] Fax: [phone]    Your procedure is scheduled on Tuesday October 15.  Report to [facility] Admitting at 10:30 A.M.  Call this number if you have problems the morning of surgery:  [phone]   Remember:  Do not eat or drink after midnight.    Take these medicines the morning of surgery with A SIP OF WATER: NONE  DO NOT take metformin (Glumetza) after 12/03/17.   DO NOT TAKE glimepiride (Amaryl) the day of surgery  7 days prior to surgery STOP taking any Aleve, Naproxen, Ibuprofen, Motrin, Advil, Goody's, BC's, all herbal medications, fish oil, and all vitamins     How to Manage Your Diabetes Before and After Surgery  Why is it important to control my blood sugar before and after surgery? . Improving blood sugar levels before and after surgery helps healing and can limit problems. . A way of improving blood sugar control is eating a healthy diet by: o  Eating less sugar and carbohydrates o  Increasing activity/exercise o  Talking with your doctor about reaching your blood sugar goals . High blood sugars (greater than 180 mg/dL) can raise your risk of infections and slow your recovery, so you will need to focus on controlling your diabetes during the weeks before surgery. . Make sure that the doctor who takes care of your diabetes knows about your planned surgery including the date and location.  How do I manage my blood sugar before surgery? . Check your blood sugar at least 4 times a day, starting 2 days before surgery, to make sure that the level is not too high or low. o Check your blood sugar the morning of your surgery when you wake up and  every 2 hours until you get to the Short Stay unit. . If your blood sugar is less than 70 mg/dL, you will need to treat for low blood sugar: o Do not take insulin. o Treat a low blood sugar (less than 70 mg/dL) with  cup of clear juice (cranberry or apple), 4 glucose tablets, OR glucose gel. Recheck blood sugar in 15 minutes after treatment (to make sure it is greater than 70 mg/dL). If your blood sugar is not greater than 70 mg/dL on recheck, call [phone] o  for further instructions. . Report your blood sugar to the short stay nurse when you get to Short Stay.  . If you are admitted to the hospital after surgery: o Your blood sugar will be checked by the staff and you will probably be given insulin after surgery (instead of oral diabetes medicines) to make sure you have good blood sugar levels. o The goal for blood sugar control after surgery is 80-180 mg/dL.           Do not wear jewelry, make-up or nail polish.  Do not wear lotions, powders, or perfumes, or deodorant.  Do not shave 48 hours prior to surgery.  Men may shave face and neck.  Do not bring valuables to the hospital.  [facility] is not responsible for any belongings or valuables.  Contacts, dentures or bridgework may not be worn into surgery.  Leave your suitcase in  the car.  After surgery it may be brought to your room.  For patients admitted to the hospital, discharge time will be determined by your treatment team.  Patients discharged the day of surgery will not be allowed to drive home.    Special instructions:    [place]- Preparing For Surgery  Before surgery, you can play an important role. Because skin is not sterile, your skin needs to be as free of germs as possible. You can reduce the number of germs on your skin by washing with CHG (chlorahexidine gluconate) Soap before surgery.  CHG is an antiseptic cleaner which kills germs and bonds with the skin to continue killing germs even after washing.     Oral Hygiene is also important to reduce your risk of infection.  Remember - BRUSH YOUR TEETH THE MORNING OF SURGERY WITH YOUR REGULAR TOOTHPASTE  Please do not use if you have an allergy to CHG or antibacterial soaps. If your skin becomes reddened/irritated stop using the CHG.  Do not shave (including legs and underarms) for at least 48 hours prior to first CHG shower. It is OK to shave your face.  Please follow these instructions carefully.   1. Shower the NIGHT BEFORE SURGERY and the MORNING OF SURGERY with CHG.   2. If you chose to wash your hair, wash your hair first as usual with your normal shampoo.  3. After you shampoo, rinse your hair and body thoroughly to remove the shampoo.  4. Use CHG as you would any other liquid soap. You can apply CHG directly to the skin and wash gently with a scrungie or a clean washcloth.   5. Apply the CHG Soap to your body ONLY FROM THE NECK DOWN.  Do not use on open wounds or open sores. Avoid contact with your eyes, ears, mouth and genitals (private parts). Wash Face and genitals (private parts)  with your normal soap.  6. Wash thoroughly, paying special attention to the area where your surgery will be performed.  7. Thoroughly rinse your body with warm water from the neck down.  8. DO NOT shower/wash with your normal soap after using and rinsing off the CHG Soap.  9. Pat yourself dry with a CLEAN TOWEL.  10. Wear CLEAN PAJAMAS to bed the night before surgery, wear comfortable clothes the morning of surgery  11. Place CLEAN SHEETS on your bed the night of your first shower and DO NOT SLEEP WITH PETS.    Day of Surgery:  Do not apply any deodorants/lotions.  Please wear clean clothes to the hospital/surgery center.   Remember to brush your teeth WITH YOUR REGULAR TOOTHPASTE.    Please read over the following fact sheets that you were given. Coughing and Deep Breathing, MRSA Information and Surgical Site Infection  Prevention

## 2017-12-02 ENCOUNTER — Encounter (HOSPITAL_COMMUNITY): Payer: Self-pay

## 2017-12-02 ENCOUNTER — Other Ambulatory Visit: Payer: Self-pay

## 2017-12-02 ENCOUNTER — Encounter (HOSPITAL_COMMUNITY)
Admission: RE | Admit: 2017-12-02 | Discharge: 2017-12-02 | Disposition: A | Discharge status: Home / Self Care | Payer: Medicare Other | Source: Ambulatory Visit | Attending: Cardiovascular Disease | Admitting: Cardiovascular Disease

## 2017-12-02 ENCOUNTER — Telehealth: Payer: Self-pay | Admitting: Physician Assistant

## 2017-12-02 ENCOUNTER — Other Ambulatory Visit (HOSPITAL_COMMUNITY): Payer: Medicare Other

## 2017-12-02 DIAGNOSIS — E785 Hyperlipidemia, unspecified: Secondary | ICD-10-CM | POA: Diagnosis not present

## 2017-12-02 DIAGNOSIS — Z7982 Long term (current) use of aspirin: Secondary | ICD-10-CM | POA: Insufficient documentation

## 2017-12-02 DIAGNOSIS — I350 Nonrheumatic aortic (valve) stenosis: Secondary | ICD-10-CM | POA: Diagnosis not present

## 2017-12-02 DIAGNOSIS — Z7984 Long term (current) use of oral hypoglycemic drugs: Secondary | ICD-10-CM | POA: Diagnosis not present

## 2017-12-02 DIAGNOSIS — D649 Anemia, unspecified: Secondary | ICD-10-CM | POA: Diagnosis not present

## 2017-12-02 DIAGNOSIS — G4730 Sleep apnea, unspecified: Secondary | ICD-10-CM | POA: Insufficient documentation

## 2017-12-02 DIAGNOSIS — Z01818 Encounter for other preprocedural examination: Secondary | ICD-10-CM | POA: Diagnosis present

## 2017-12-02 DIAGNOSIS — Z87891 Personal history of nicotine dependence: Secondary | ICD-10-CM | POA: Diagnosis not present

## 2017-12-02 DIAGNOSIS — I509 Heart failure, unspecified: Secondary | ICD-10-CM | POA: Diagnosis not present

## 2017-12-02 DIAGNOSIS — R942 Abnormal results of pulmonary function studies: Secondary | ICD-10-CM | POA: Insufficient documentation

## 2017-12-02 DIAGNOSIS — E119 Type 2 diabetes mellitus without complications: Secondary | ICD-10-CM | POA: Diagnosis not present

## 2017-12-02 DIAGNOSIS — Z952 Presence of prosthetic heart valve: Secondary | ICD-10-CM | POA: Diagnosis not present

## 2017-12-02 DIAGNOSIS — Z79899 Other long term (current) drug therapy: Secondary | ICD-10-CM | POA: Insufficient documentation

## 2017-12-02 DIAGNOSIS — I110 Hypertensive heart disease with heart failure: Secondary | ICD-10-CM | POA: Insufficient documentation

## 2017-12-02 DIAGNOSIS — R9431 Abnormal electrocardiogram [ECG] [EKG]: Secondary | ICD-10-CM | POA: Insufficient documentation

## 2017-12-02 HISTORY — DX: Unspecified abnormal finding in specimens from other organs, systems and tissues: R89.9

## 2017-12-02 LAB — PULMONARY FUNCTION TEST
DL/VA % PRED: 112 %
DL/VA: 5.11 ml/min/mmHg/L
DLCO unc % pred: 74 %
DLCO unc: 16.02 ml/min/mmHg
FEF 25-75 Post: 1.84 L/sec
FEF 25-75 Pre: 1.87 L/sec
FEF2575-%Change-Post: -1 %
FEF2575-%Pred-Post: 121 %
FEF2575-%Pred-Pre: 123 %
FEV1-%CHANGE-POST: 1 %
FEV1-%PRED-PRE: 81 %
FEV1-%Pred-Post: 82 %
FEV1-PRE: 1.56 L
FEV1-Post: 1.58 L
FEV1FVC-%Change-Post: 1 %
FEV1FVC-%Pred-Pre: 109 %
FEV6-%Change-Post: 0 %
FEV6-%PRED-PRE: 77 %
FEV6-%Pred-Post: 78 %
FEV6-PRE: 1.89 L
FEV6-Post: 1.9 L
FEV6FVC-%Change-Post: 0 %
FEV6FVC-%PRED-PRE: 105 %
FEV6FVC-%Pred-Post: 105 %
FVC-%Change-Post: 0 %
FVC-%PRED-POST: 74 %
FVC-%PRED-PRE: 74 %
FVC-POST: 1.9 L
FVC-PRE: 1.9 L
PRE FEV6/FVC RATIO: 100 %
Post FEV1/FVC ratio: 83 %
Post FEV6/FVC ratio: 100 %
Pre FEV1/FVC ratio: 82 %
RV % pred: 100 %
RV: 2.22 L
TLC % PRED: 90 %
TLC: 4.32 L

## 2017-12-02 LAB — COMPREHENSIVE METABOLIC PANEL
ALK PHOS: 68 U/L (ref 38–126)
ALT: 15 U/L (ref 0–44)
ANION GAP: 9 (ref 5–15)
AST: 17 U/L (ref 15–41)
Albumin: 3.2 g/dL — ABNORMAL LOW (ref 3.5–5.0)
BILIRUBIN TOTAL: 0.6 mg/dL (ref 0.3–1.2)
BUN: 34 mg/dL — ABNORMAL HIGH (ref 8–23)
CALCIUM: 8.7 mg/dL — AB (ref 8.9–10.3)
CO2: 26 mmol/L (ref 22–32)
CREATININE: 1.33 mg/dL — AB (ref 0.44–1.00)
Chloride: 104 mmol/L (ref 98–111)
GFR, EST AFRICAN AMERICAN: 44 mL/min — AB (ref >60)
GFR, EST NON AFRICAN AMERICAN: 38 mL/min — AB (ref >60)
Glucose, Bld: 150 mg/dL — ABNORMAL HIGH (ref 70–99)
Potassium: 3.8 mmol/L (ref 3.5–5.1)
Sodium: 139 mmol/L (ref 135–145)
TOTAL PROTEIN: 7.2 g/dL (ref 6.5–8.1)

## 2017-12-02 LAB — URINALYSIS, ROUTINE W REFLEX MICROSCOPIC
Bilirubin Urine: NEGATIVE
Glucose, UA: NEGATIVE mg/dL
HGB URINE DIPSTICK: NEGATIVE
KETONES UR: NEGATIVE mg/dL
NITRITE: NEGATIVE
PH: 5 (ref 5.0–8.0)
PROTEIN: NEGATIVE mg/dL
Specific Gravity, Urine: 1.011 (ref 1.005–1.030)

## 2017-12-02 LAB — CBC
HCT: 38.6 % (ref 36.0–46.0)
HEMOGLOBIN: 11.8 g/dL — AB (ref 12.0–15.0)
MCH: 28.6 pg (ref 26.0–34.0)
MCHC: 30.6 g/dL (ref 30.0–36.0)
MCV: 93.7 fL (ref 80.0–100.0)
Platelets: 170 10*3/uL (ref 150–400)
RBC: 4.12 MIL/uL (ref 3.87–5.11)
RDW: 14.6 % (ref 11.5–15.5)
WBC: 11.2 10*3/uL — ABNORMAL HIGH (ref 4.0–10.5)
nRBC: 0 % (ref 0.0–0.2)

## 2017-12-02 LAB — SURGICAL PCR SCREEN
MRSA, PCR: NEGATIVE
Staphylococcus aureus: NEGATIVE

## 2017-12-02 LAB — TYPE AND SCREEN
ABO/RH(D): A POS
Antibody Screen: NEGATIVE

## 2017-12-02 LAB — HEMOGLOBIN A1C
HEMOGLOBIN A1C: 7.4 % — AB (ref 4.8–5.6)
MEAN PLASMA GLUCOSE: 165.68 mg/dL

## 2017-12-02 LAB — BLOOD GAS, ARTERIAL
ACID-BASE EXCESS: 3.8 mmol/L — AB (ref 0.0–2.0)
BICARBONATE: 27 mmol/L (ref 20.0–28.0)
Drawn by: 449821
FIO2: 21
O2 Saturation: 89.3 %
PO2 ART: 54.3 mmHg — AB (ref 83.0–108.0)
Patient temperature: 98.6
pCO2 arterial: 34.8 mmHg (ref 32.0–48.0)
pH, Arterial: 7.501 — ABNORMAL HIGH (ref 7.350–7.450)

## 2017-12-02 LAB — BRAIN NATRIURETIC PEPTIDE: B NATRIURETIC PEPTIDE 5: 111.8 pg/mL — AB (ref 0.0–100.0)

## 2017-12-02 LAB — APTT: aPTT: 29 seconds (ref 24–36)

## 2017-12-02 LAB — ABO/RH: ABO/RH(D): A POS

## 2017-12-02 LAB — PROTIME-INR
INR: 1.06
PROTHROMBIN TIME: 13.8 s (ref 11.4–15.2)

## 2017-12-02 LAB — GLUCOSE, CAPILLARY: GLUCOSE-CAPILLARY: 141 mg/dL — AB (ref 70–99)

## 2017-12-02 MED ORDER — ALBUTEROL SULFATE (2.5 MG/3ML) 0.083% IN NEBU
2.5000 mg | INHALATION_SOLUTION | Freq: Once | RESPIRATORY_TRACT | Status: AC
  Administered 2017-12-02: 2.5 mg via RESPIRATORY_TRACT

## 2017-12-02 NOTE — Progress Notes (Signed)
   12/02/17 1125  OBSTRUCTIVE SLEEP APNEA  Have you ever been diagnosed with sleep apnea through a sleep study? No  Do you snore loudly (loud enough to be heard through closed doors)?  1  Do you often feel tired, fatigued, or sleepy during the daytime (such as falling asleep during driving or talking to someone)? 0  Has anyone observed you stop breathing during your sleep? 0  Do you have, or are you being treated for high blood pressure? 1  BMI more than 35 kg/m2? 1  Age > 50 (1-yes) 1  Neck circumference greater than:Female 16 inches or larger, Female 17inches or larger? 1  Female Gender (Yes=1) 0  Obstructive Sleep Apnea Score 5  Score 5 or greater  Results sent to PCP

## 2017-12-02 NOTE — Progress Notes (Addendum)
PCP - Dr. Claris Gower Cardiologist - Dr. Landry Corporal  Chest x-ray - 12/02/17 EKG - 12/02/17 Stress Test - denies ECHO - 10/03/17 Cardiac Cath - 11/02/17  Sleep Study - denies, stop bang positive sent to PCP  Checks Blood Sugar 1 time a day. Does not check a fasting CBG. CBG at PAT appt: 141  Instructed to hold Metformin starting 12/03/17.   Anesthesia review: Yes  Patient denies shortness of breath, fever, cough and chest pain at PAT appointment   Patient verbalized understanding of instructions that were given to them at the PAT appointment. Patient was also instructed that they will need to review over the PAT instructions again at home before surgery.

## 2017-12-02 NOTE — Telephone Encounter (Signed)
  [facility]   MULTIDISCIPLINARY HEART VALVE TEAM   I was called by PAT RN about abnormal UA. I called patient to let her know that we would need to treat her ONLY if she is having symptoms. I have asked her to call us back if she is having increased frequency or dysuria and we will treat prior to her upcoming surgery.  Angelena Form PA-C  [facility]

## 2017-12-02 NOTE — Progress Notes (Signed)
Left message with Theodosia Quay, RN with abnormal UA results.

## 2017-12-05 ENCOUNTER — Telehealth: Payer: Self-pay | Admitting: Physician Assistant

## 2017-12-05 MED ORDER — INSULIN REGULAR(HUMAN) IN NACL 100-0.9 UT/100ML-% IV SOLN
INTRAVENOUS | Status: DC
  Filled 2017-12-05: qty 100

## 2017-12-05 MED ORDER — POTASSIUM CHLORIDE 2 MEQ/ML IV SOLN
80.0000 meq | INTRAVENOUS | Status: DC
  Filled 2017-12-05: qty 40

## 2017-12-05 MED ORDER — SODIUM CHLORIDE 0.9 % IV SOLN
INTRAVENOUS | Status: DC
  Filled 2017-12-05: qty 30

## 2017-12-05 MED ORDER — MAGNESIUM SULFATE 50 % IJ SOLN
40.0000 meq | INTRAMUSCULAR | Status: DC
  Filled 2017-12-05: qty 9.85

## 2017-12-05 MED ORDER — SODIUM CHLORIDE 0.9 % IV SOLN
1.5000 g | INTRAVENOUS | Status: AC
  Administered 2017-12-06: 1.5 g via INTRAVENOUS
  Filled 2017-12-05: qty 1.5

## 2017-12-05 MED ORDER — EPINEPHRINE PF 1 MG/ML IJ SOLN
0.0000 ug/min | INTRAVENOUS | Status: DC
  Filled 2017-12-05: qty 4

## 2017-12-05 MED ORDER — VANCOMYCIN HCL 10 G IV SOLR
1500.0000 mg | INTRAVENOUS | Status: AC
  Administered 2017-12-06: 1500 mg via INTRAVENOUS
  Filled 2017-12-05: qty 1500

## 2017-12-05 MED ORDER — NITROGLYCERIN IN D5W 200-5 MCG/ML-% IV SOLN
2.0000 ug/min | INTRAVENOUS | Status: DC
  Filled 2017-12-05: qty 250

## 2017-12-05 MED ORDER — DEXMEDETOMIDINE HCL IN NACL 400 MCG/100ML IV SOLN
0.1000 ug/kg/h | INTRAVENOUS | Status: DC
  Filled 2017-12-05: qty 100

## 2017-12-05 MED ORDER — NOREPINEPHRINE 4 MG/250ML-% IV SOLN
0.0000 ug/min | INTRAVENOUS | Status: AC
  Administered 2017-12-06: 2 ug/min via INTRAVENOUS
  Filled 2017-12-05: qty 250

## 2017-12-05 MED ORDER — DOPAMINE-DEXTROSE 3.2-5 MG/ML-% IV SOLN
0.0000 ug/kg/min | INTRAVENOUS | Status: DC
  Filled 2017-12-05: qty 250

## 2017-12-05 MED ORDER — PHENYLEPHRINE HCL-NACL 20-0.9 MG/250ML-% IV SOLN
30.0000 ug/min | INTRAVENOUS | Status: DC
  Filled 2017-12-05: qty 250

## 2017-12-05 NOTE — Progress Notes (Signed)
Anesthesia Chart Review:   Case:  [id number] Date/Time:  12/06/17 1213   Procedures:      TRANSCATHETER AORTIC VALVE REPLACEMENT, TRANSFEMORAL (N/A Chest)     TRANSESOPHAGEAL ECHOCARDIOGRAM (TEE) (N/A )   Anesthesia type:  General   Pre-op diagnosis:  Severe Aortic Stenosis   Location:  [facility] OR ROOM 16 / [facility]   Surgeon:  [name], MD      DISCUSSION:  - Pt is a 77 year old female with hx aortic stenosis, HTN, DM, fatty liver disease   - UA from pre-admission testing showed rare bacteria, leukocytosis. Ms. Grandville Silos, [place] for TAVR team is aware.   - Renal function at pre-admission testing poor compared with usual (BUN/creat 15/0.97 --> 34/1.33). Ms. Grandville Silos has asked pt to hold lasix and enalapril 12/05/17 in anticipation of TAVR 12/06/17.    VS: BP 127/88   Pulse 80   Temp 36.7 C   Resp 20   Ht 5' 1.5" (1.562 m)   Wt (!) 138.7 kg   SpO2 94%   BMI 56.84 kg/m    PROVIDERS: - PCP is Leonard Downing, MD - Cardiologist is Tollie Eth, MD   LABS: Labs reviewed: Acceptable for surgery.   (all labs ordered are listed, but only abnormal results are displayed)  Labs Reviewed  BLOOD GAS, ARTERIAL - Abnormal; Notable for the following components:      Result Value   pH, Arterial 7.501 (*)    pO2, Arterial 54.3 (*)    Acid-Base Excess 3.8 (*)    All other components within normal limits  BRAIN NATRIURETIC PEPTIDE - Abnormal; Notable for the following components:   B Natriuretic Peptide 111.8 (*)    All other components within normal limits  CBC - Abnormal; Notable for the following components:   WBC 11.2 (*)    Hemoglobin 11.8 (*)    All other components within normal limits  COMPREHENSIVE METABOLIC PANEL - Abnormal; Notable for the following components:   Glucose, Bld 150 (*)    BUN 34 (*)    Creatinine, Ser 1.33 (*)    Calcium 8.7 (*)    Albumin 3.2 (*)    GFR calc non Af Amer 38 (*)    GFR calc Af Amer 44 (*)    All other components within normal limits   HEMOGLOBIN A1C - Abnormal; Notable for the following components:   Hgb A1c MFr Bld 7.4 (*)    All other components within normal limits  URINALYSIS, ROUTINE W REFLEX MICROSCOPIC - Abnormal; Notable for the following components:   Leukocytes, UA MODERATE (*)    Bacteria, UA RARE (*)    All other components within normal limits  SURGICAL PCR SCREEN  APTT  PROTIME-INR  TYPE AND SCREEN  ABO/RH     IMAGES:  CXR 12/02/17: Stable cardiomegaly without overt pulmonary edema. No active pulmonary disease.   EKG 12/02/17: NSR. Septal infarct, age undetermined   CV:  Carotid duplex 11/14/17:  - Right Carotid: Velocities in the right ICA are consistent with a 1-39% stenosis. - Left Carotid: Velocities in the left ICA are consistent with a 1-39% stenosis. - Vertebrals: Bilateral vertebral arteries demonstrate antegrade flow. - Subclavians: Right subclavian artery was not visualized. Normal flow hemodynamics were seen in the left subclavian artery.  Cardiac cath 11/02/17:   Mid RCA lesion is 10% stenosed.  Prox LAD to Mid LAD lesion is 10% stenosed. 1. Mild non-obstructive CAD 2. Severe aortic stenosis (peak to peak gradient 62 mmHg,  mean gradient 51.6 mmHg, AVA 0.82 cm2)  Echo 10/03/17:  - LV cavity size was normal.  Moderate concentric LVH.  LV systolic function was mildly decreased with ejection fraction estimated 50%.   - There was severe LA enlargement.  There was severe aortic stenosis.  Peak velocity across the aortic valve measured 4.12 m/s corresponding to mean transvalvular gradient estimated 34 mmHg.  Aortic valve area was calculated 0.79 cm.  DVI was not reported.  The leaflets were thickened with severely restricted leaflet mobility.   - There was mild mitral regurgitation and trace tricuspid regurgitation.   Past Medical History:  Diagnosis Date  . Abnormal thyroid biopsy 2018   results were negative.  [place] Anxiety    self reported  . Diabetes mellitus without  complication ([place])   . Diarrhea   . Diverticulosis of colon (without mention of hemorrhage)   . Esophageal reflux    pt denies having GERD 12/02/17  . External hemorrhoids without mention of complication   . Fatty liver disease, nonalcoholic   . Fibromyalgia   . Heart murmur   . Hyperlipemia   . Hypertension   . IBS (irritable bowel syndrome)   . Obesity, unspecified   . Personal history of colonic polyps    adenomatous & TUBULAR ADENOMA  . Psoriasis     Past Surgical History:  Procedure Laterality Date  . APPENDECTOMY    . BACK SURGERY    . CATARACT EXTRACTION W/ INTRAOCULAR LENS IMPLANT Bilateral 2017  . CHOLECYSTECTOMY    . LEFT AND RIGHT HEART CATHETERIZATION WITH CORONARY ANGIOGRAM N/A 11/15/2013   Procedure: LEFT AND RIGHT HEART CATHETERIZATION WITH CORONARY ANGIOGRAM;  Surgeon: Blane Ohara, MD;  Location: [facility] CATH LAB;  Service: Cardiovascular;  Laterality: N/A;  . RIGHT/LEFT HEART CATH AND CORONARY ANGIOGRAPHY N/A 11/02/2017   Procedure: RIGHT/LEFT HEART CATH AND CORONARY ANGIOGRAPHY;  Surgeon: Burnell Blanks, MD;  Location: [place] CV LAB;  Service: Cardiovascular;  Laterality: N/A;  . TONSILLECTOMY    . VAGINAL HYSTERECTOMY      MEDICATIONS: . aspirin EC 81 MG tablet  . atorvastatin (LIPITOR) 40 MG tablet  . Cholecalciferol (VITAMIN D-3) 5000 units TABS  . Cranberry 250 MG TABS  . cyclobenzaprine (FLEXERIL) 10 MG tablet  . enalapril (VASOTEC) 10 MG tablet  . furosemide (LASIX) 80 MG tablet  . glimepiride (AMARYL) 4 MG tablet  . metFORMIN (GLUMETZA) 1000 MG (MOD) 24 hr tablet  . metoprolol tartrate (LOPRESSOR) 50 MG tablet  . Multiple Vitamin (MULTIVITAMIN WITH MINERALS) TABS tablet  . Potassium 99 MG TABS  . traMADol (ULTRAM) 50 MG tablet   No current facility-administered medications for this encounter.     If no changes, I anticipate pt can proceed with surgery as scheduled.   Willeen Cass, FNP-BC [facility] Short Stay Surgical  Center/Anesthesiology Phone: [phone] 12/05/2017 11:17 AM

## 2017-12-05 NOTE — Telephone Encounter (Signed)
  [facility]   MULTIDISCIPLINARY HEART VALVE TEAM   Pre op labs showed mildly elevated BUN/creat 15/0.97 --> 34/1.33. I have asked her to hold her lasix and enalapril today in anticipation of surgery tomorrow. She verbalized understanding.  Angelena Form PA-C  [facility]

## 2017-12-06 ENCOUNTER — Ambulatory Visit (HOSPITAL_COMMUNITY): Payer: Medicare Other

## 2017-12-06 ENCOUNTER — Inpatient Hospital Stay (HOSPITAL_COMMUNITY): Payer: Medicare Other

## 2017-12-06 ENCOUNTER — Other Ambulatory Visit: Payer: Self-pay

## 2017-12-06 ENCOUNTER — Encounter (HOSPITAL_COMMUNITY): Payer: Self-pay | Admitting: *Deleted

## 2017-12-06 ENCOUNTER — Inpatient Hospital Stay (HOSPITAL_COMMUNITY): Payer: Medicare Other | Admitting: Certified Registered"

## 2017-12-06 ENCOUNTER — Inpatient Hospital Stay (HOSPITAL_COMMUNITY): Payer: Medicare Other | Admitting: Vascular Surgery

## 2017-12-06 ENCOUNTER — Inpatient Hospital Stay (HOSPITAL_COMMUNITY)
Admission: RE | Admit: 2017-12-06 | Discharge: 2017-12-08 | DRG: 267 | Disposition: A | Discharge status: Home / Self Care | Payer: Medicare Other | Source: Ambulatory Visit | Attending: Cardiovascular Disease | Admitting: Cardiovascular Disease

## 2017-12-06 ENCOUNTER — Encounter (HOSPITAL_COMMUNITY)
Admission: RE | Disposition: A | Discharge status: Home / Self Care | Payer: Self-pay | Source: Ambulatory Visit | Attending: Cardiovascular Disease

## 2017-12-06 DIAGNOSIS — Z801 Family history of malignant neoplasm of trachea, bronchus and lung: Secondary | ICD-10-CM

## 2017-12-06 DIAGNOSIS — Z6841 Body Mass Index (BMI) 40.0 and over, adult: Secondary | ICD-10-CM

## 2017-12-06 DIAGNOSIS — Z006 Encounter for examination for normal comparison and control in clinical research program: Secondary | ICD-10-CM

## 2017-12-06 DIAGNOSIS — I110 Hypertensive heart disease with heart failure: Secondary | ICD-10-CM | POA: Diagnosis present

## 2017-12-06 DIAGNOSIS — K219 Gastro-esophageal reflux disease without esophagitis: Secondary | ICD-10-CM | POA: Diagnosis present

## 2017-12-06 DIAGNOSIS — Z9049 Acquired absence of other specified parts of digestive tract: Secondary | ICD-10-CM | POA: Diagnosis not present

## 2017-12-06 DIAGNOSIS — Z882 Allergy status to sulfonamides status: Secondary | ICD-10-CM

## 2017-12-06 DIAGNOSIS — E119 Type 2 diabetes mellitus without complications: Secondary | ICD-10-CM | POA: Diagnosis present

## 2017-12-06 DIAGNOSIS — I2510 Atherosclerotic heart disease of native coronary artery without angina pectoris: Secondary | ICD-10-CM | POA: Diagnosis present

## 2017-12-06 DIAGNOSIS — Z87891 Personal history of nicotine dependence: Secondary | ICD-10-CM | POA: Diagnosis not present

## 2017-12-06 DIAGNOSIS — L409 Psoriasis, unspecified: Secondary | ICD-10-CM | POA: Diagnosis present

## 2017-12-06 DIAGNOSIS — E785 Hyperlipidemia, unspecified: Secondary | ICD-10-CM | POA: Diagnosis present

## 2017-12-06 DIAGNOSIS — I447 Left bundle-branch block, unspecified: Secondary | ICD-10-CM | POA: Diagnosis present

## 2017-12-06 DIAGNOSIS — Z888 Allergy status to other drugs, medicaments and biological substances status: Secondary | ICD-10-CM

## 2017-12-06 DIAGNOSIS — Z8719 Personal history of other diseases of the digestive system: Secondary | ICD-10-CM

## 2017-12-06 DIAGNOSIS — R1310 Dysphagia, unspecified: Secondary | ICD-10-CM

## 2017-12-06 DIAGNOSIS — Z7984 Long term (current) use of oral hypoglycemic drugs: Secondary | ICD-10-CM

## 2017-12-06 DIAGNOSIS — I700 Atherosclerosis of aorta: Secondary | ICD-10-CM | POA: Diagnosis present

## 2017-12-06 DIAGNOSIS — Z833 Family history of diabetes mellitus: Secondary | ICD-10-CM

## 2017-12-06 DIAGNOSIS — K5730 Diverticulosis of large intestine without perforation or abscess without bleeding: Secondary | ICD-10-CM | POA: Diagnosis present

## 2017-12-06 DIAGNOSIS — Z8249 Family history of ischemic heart disease and other diseases of the circulatory system: Secondary | ICD-10-CM | POA: Diagnosis not present

## 2017-12-06 DIAGNOSIS — I350 Nonrheumatic aortic (valve) stenosis: Secondary | ICD-10-CM

## 2017-12-06 DIAGNOSIS — Z90710 Acquired absence of both cervix and uterus: Secondary | ICD-10-CM

## 2017-12-06 DIAGNOSIS — I272 Pulmonary hypertension, unspecified: Secondary | ICD-10-CM | POA: Diagnosis present

## 2017-12-06 DIAGNOSIS — I10 Essential (primary) hypertension: Secondary | ICD-10-CM | POA: Diagnosis present

## 2017-12-06 DIAGNOSIS — I5030 Unspecified diastolic (congestive) heart failure: Secondary | ICD-10-CM | POA: Diagnosis not present

## 2017-12-06 DIAGNOSIS — I5032 Chronic diastolic (congestive) heart failure: Secondary | ICD-10-CM | POA: Diagnosis present

## 2017-12-06 DIAGNOSIS — M797 Fibromyalgia: Secondary | ICD-10-CM | POA: Diagnosis present

## 2017-12-06 DIAGNOSIS — Z952 Presence of prosthetic heart valve: Secondary | ICD-10-CM

## 2017-12-06 DIAGNOSIS — Z881 Allergy status to other antibiotic agents status: Secondary | ICD-10-CM

## 2017-12-06 DIAGNOSIS — Z885 Allergy status to narcotic agent status: Secondary | ICD-10-CM

## 2017-12-06 DIAGNOSIS — Z7982 Long term (current) use of aspirin: Secondary | ICD-10-CM

## 2017-12-06 DIAGNOSIS — K760 Fatty (change of) liver, not elsewhere classified: Secondary | ICD-10-CM | POA: Diagnosis present

## 2017-12-06 HISTORY — DX: Personal history of other diseases of the digestive system: Z87.19

## 2017-12-06 HISTORY — DX: Heart failure, unspecified: I50.9

## 2017-12-06 HISTORY — DX: Other chronic pain: G89.29

## 2017-12-06 HISTORY — DX: Type 2 diabetes mellitus without complications: E11.9

## 2017-12-06 HISTORY — PX: TEE WITHOUT CARDIOVERSION: SHX5443

## 2017-12-06 HISTORY — DX: Major depressive disorder, single episode, unspecified: F32.9

## 2017-12-06 HISTORY — DX: Presence of prosthetic heart valve: Z95.2

## 2017-12-06 HISTORY — DX: Depression, unspecified: F32.A

## 2017-12-06 HISTORY — DX: Unspecified osteoarthritis, unspecified site: M19.90

## 2017-12-06 HISTORY — DX: Headache, unspecified: R51.9

## 2017-12-06 HISTORY — DX: Sleep apnea, unspecified: G47.30

## 2017-12-06 HISTORY — DX: Morbid (severe) obesity due to excess calories: E66.01

## 2017-12-06 HISTORY — DX: Nonrheumatic aortic (valve) stenosis: I35.0

## 2017-12-06 HISTORY — DX: Headache: R51

## 2017-12-06 HISTORY — DX: Dorsalgia, unspecified: M54.9

## 2017-12-06 HISTORY — DX: Anemia, unspecified: D64.9

## 2017-12-06 HISTORY — PX: TRANSCATHETER AORTIC VALVE REPLACEMENT, TRANSFEMORAL: SHX6400

## 2017-12-06 HISTORY — PX: TRANSESOPHAGEAL ECHOCARDIOGRAM: SHX273

## 2017-12-06 HISTORY — DX: Personal history of urinary calculi: Z87.442

## 2017-12-06 LAB — POCT I-STAT, CHEM 8
BUN: 27 mg/dL — AB (ref 8–23)
BUN: 25 mg/dL — AB (ref 8–23)
BUN: 24 mg/dL — ABNORMAL HIGH (ref 8–23)
BUN: 27 mg/dL — ABNORMAL HIGH (ref 8–23)
CALCIUM ION: 1.17 mmol/L (ref 1.15–1.40)
CHLORIDE: 104 mmol/L (ref 98–111)
CHLORIDE: 105 mmol/L (ref 98–111)
CHLORIDE: 107 mmol/L (ref 98–111)
CREATININE: 0.9 mg/dL (ref 0.44–1.00)
Calcium, Ion: 1.2 mmol/L (ref 1.15–1.40)
Calcium, Ion: 1.18 mmol/L (ref 1.15–1.40)
Calcium, Ion: 1.17 mmol/L (ref 1.15–1.40)
Chloride: 105 mmol/L (ref 98–111)
Creatinine, Ser: 0.9 mg/dL (ref 0.44–1.00)
Creatinine, Ser: 0.9 mg/dL (ref 0.44–1.00)
Creatinine, Ser: 0.9 mg/dL (ref 0.44–1.00)
GLUCOSE: 184 mg/dL — AB (ref 70–99)
GLUCOSE: 189 mg/dL — AB (ref 70–99)
Glucose, Bld: 169 mg/dL — ABNORMAL HIGH (ref 70–99)
Glucose, Bld: 166 mg/dL — ABNORMAL HIGH (ref 70–99)
HCT: 34 % — ABNORMAL LOW (ref 36.0–46.0)
HCT: 32 % — ABNORMAL LOW (ref 36.0–46.0)
HCT: 32 % — ABNORMAL LOW (ref 36.0–46.0)
HEMATOCRIT: 32 % — AB (ref 36.0–46.0)
HEMOGLOBIN: 10.9 g/dL — AB (ref 12.0–15.0)
Hemoglobin: 11.6 g/dL — ABNORMAL LOW (ref 12.0–15.0)
Hemoglobin: 10.9 g/dL — ABNORMAL LOW (ref 12.0–15.0)
Hemoglobin: 10.9 g/dL — ABNORMAL LOW (ref 12.0–15.0)
POTASSIUM: 4.1 mmol/L (ref 3.5–5.1)
POTASSIUM: 3.9 mmol/L (ref 3.5–5.1)
POTASSIUM: 4.2 mmol/L (ref 3.5–5.1)
Potassium: 4.1 mmol/L (ref 3.5–5.1)
SODIUM: 140 mmol/L (ref 135–145)
SODIUM: 141 mmol/L (ref 135–145)
Sodium: 139 mmol/L (ref 135–145)
Sodium: 138 mmol/L (ref 135–145)
TCO2: 30 mmol/L (ref 22–32)
TCO2: 27 mmol/L (ref 22–32)
TCO2: 24 mmol/L (ref 22–32)
TCO2: 28 mmol/L (ref 22–32)

## 2017-12-06 LAB — GLUCOSE, CAPILLARY
GLUCOSE-CAPILLARY: 176 mg/dL — AB (ref 70–99)
GLUCOSE-CAPILLARY: 218 mg/dL — AB (ref 70–99)
Glucose-Capillary: 385 mg/dL — ABNORMAL HIGH (ref 70–99)

## 2017-12-06 SURGERY — IMPLANTATION, AORTIC VALVE, TRANSCATHETER, FEMORAL APPROACH
Anesthesia: Monitor Anesthesia Care | Site: Chest

## 2017-12-06 MED ORDER — CHLORHEXIDINE GLUCONATE 0.12 % MT SOLN
15.0000 mL | Freq: Once | OROMUCOSAL | Status: AC
  Administered 2017-12-06: 15 mL via OROMUCOSAL
  Filled 2017-12-06: qty 15

## 2017-12-06 MED ORDER — ONDANSETRON HCL 4 MG/2ML IJ SOLN: 4.0000 mg | Freq: Four times a day (QID) | INTRAMUSCULAR | Status: DC | PRN

## 2017-12-06 MED ORDER — CHLORHEXIDINE GLUCONATE 4 % EX LIQD: 30.0000 mL | CUTANEOUS | Status: DC

## 2017-12-06 MED ORDER — CHLORHEXIDINE GLUCONATE 4 % EX LIQD: 60.0000 mL | Freq: Once | CUTANEOUS | Status: DC

## 2017-12-06 MED ORDER — OXYCODONE HCL 5 MG PO TABS: 5.0000 mg | ORAL_TABLET | ORAL | Status: DC | PRN

## 2017-12-06 MED ORDER — 0.9 % SODIUM CHLORIDE (POUR BTL) OPTIME
TOPICAL | Status: DC | PRN
  Administered 2017-12-06: 2000 mL

## 2017-12-06 MED ORDER — LIDOCAINE 2% (20 MG/ML) 5 ML SYRINGE
INTRAMUSCULAR | Status: DC | PRN
  Administered 2017-12-06: 50 mg via INTRAVENOUS

## 2017-12-06 MED ORDER — PHENYLEPHRINE 40 MCG/ML (10ML) SYRINGE FOR IV PUSH (FOR BLOOD PRESSURE SUPPORT)
PREFILLED_SYRINGE | INTRAVENOUS | Status: AC
  Filled 2017-12-06: qty 10

## 2017-12-06 MED ORDER — ROCURONIUM BROMIDE 10 MG/ML (PF) SYRINGE
PREFILLED_SYRINGE | INTRAVENOUS | Status: DC | PRN
  Administered 2017-12-06: 50 mg via INTRAVENOUS
  Administered 2017-12-06: 10 mg via INTRAVENOUS
  Administered 2017-12-06: 20 mg via INTRAVENOUS

## 2017-12-06 MED ORDER — PHENYLEPHRINE 40 MCG/ML (10ML) SYRINGE FOR IV PUSH (FOR BLOOD PRESSURE SUPPORT)
PREFILLED_SYRINGE | INTRAVENOUS | Status: DC | PRN
  Administered 2017-12-06: 40 ug via INTRAVENOUS

## 2017-12-06 MED ORDER — IODIXANOL 320 MG/ML IV SOLN
INTRAVENOUS | Status: DC | PRN
  Administered 2017-12-06: 29.9 mL via INTRAVENOUS

## 2017-12-06 MED ORDER — SODIUM CHLORIDE 0.9% FLUSH: 3.0000 mL | INTRAVENOUS | Status: DC | PRN

## 2017-12-06 MED ORDER — ACETAMINOPHEN 325 MG PO TABS
650.0000 mg | ORAL_TABLET | Freq: Four times a day (QID) | ORAL | Status: DC | PRN
  Administered 2017-12-08: 650 mg via ORAL
  Filled 2017-12-06: qty 2

## 2017-12-06 MED ORDER — ASPIRIN 81 MG PO CHEW
81.0000 mg | CHEWABLE_TABLET | Freq: Every day | ORAL | Status: DC
  Administered 2017-12-07 – 2017-12-08 (×2): 81 mg via ORAL
  Filled 2017-12-06 (×2): qty 1

## 2017-12-06 MED ORDER — SUGAMMADEX SODIUM 200 MG/2ML IV SOLN
INTRAVENOUS | Status: DC | PRN
  Administered 2017-12-06: 270 mg via INTRAVENOUS

## 2017-12-06 MED ORDER — HEPARIN SODIUM (PORCINE) 1000 UNIT/ML IJ SOLN
INTRAMUSCULAR | Status: AC
  Filled 2017-12-06: qty 1

## 2017-12-06 MED ORDER — SODIUM CHLORIDE 0.9 % IV SOLN: INTRAVENOUS | Status: DC

## 2017-12-06 MED ORDER — ROCURONIUM BROMIDE 50 MG/5ML IV SOSY
PREFILLED_SYRINGE | INTRAVENOUS | Status: AC
  Filled 2017-12-06: qty 5

## 2017-12-06 MED ORDER — SODIUM CHLORIDE 0.9 % IV SOLN
INTRAVENOUS | Status: AC
  Filled 2017-12-06 (×3): qty 1.2

## 2017-12-06 MED ORDER — FENTANYL CITRATE (PF) 100 MCG/2ML IJ SOLN
INTRAMUSCULAR | Status: DC | PRN
  Administered 2017-12-06: 50 ug via INTRAVENOUS
  Administered 2017-12-06: 25 ug via INTRAVENOUS
  Administered 2017-12-06: 100 ug via INTRAVENOUS

## 2017-12-06 MED ORDER — MORPHINE SULFATE (PF) 2 MG/ML IV SOLN: 2.0000 mg | INTRAVENOUS | Status: DC | PRN

## 2017-12-06 MED ORDER — SODIUM CHLORIDE 0.9 % IV SOLN
INTRAVENOUS | Status: AC
  Administered 2017-12-06: 50 mL/h via INTRAVENOUS

## 2017-12-06 MED ORDER — LIDOCAINE 2% (20 MG/ML) 5 ML SYRINGE
INTRAMUSCULAR | Status: AC
  Filled 2017-12-06: qty 5

## 2017-12-06 MED ORDER — SUGAMMADEX SODIUM 500 MG/5ML IV SOLN
INTRAVENOUS | Status: AC
  Filled 2017-12-06: qty 5

## 2017-12-06 MED ORDER — DEXAMETHASONE SODIUM PHOSPHATE 10 MG/ML IJ SOLN
INTRAMUSCULAR | Status: AC
  Filled 2017-12-06: qty 1

## 2017-12-06 MED ORDER — LACTATED RINGERS IV SOLN
INTRAVENOUS | Status: DC
  Administered 2017-12-06: 11:00:00 via INTRAVENOUS

## 2017-12-06 MED ORDER — MIDAZOLAM HCL 2 MG/2ML IJ SOLN
INTRAMUSCULAR | Status: AC
  Filled 2017-12-06: qty 2

## 2017-12-06 MED ORDER — PHENYLEPHRINE HCL-NACL 20-0.9 MG/250ML-% IV SOLN
0.0000 ug/min | INTRAVENOUS | Status: DC
  Filled 2017-12-06: qty 250

## 2017-12-06 MED ORDER — SODIUM CHLORIDE 0.9 % IV SOLN
INTRAVENOUS | Status: DC | PRN
  Administered 2017-12-06: 1500 mL

## 2017-12-06 MED ORDER — LACTATED RINGERS IV SOLN
INTRAVENOUS | Status: DC | PRN
  Administered 2017-12-06: 12:00:00 via INTRAVENOUS

## 2017-12-06 MED ORDER — PROTAMINE SULFATE 10 MG/ML IV SOLN
INTRAVENOUS | Status: DC | PRN
  Administered 2017-12-06: 30 mg via INTRAVENOUS
  Administered 2017-12-06: 50 mg via INTRAVENOUS
  Administered 2017-12-06: 20 mg via INTRAVENOUS
  Administered 2017-12-06: 30 mg via INTRAVENOUS
  Administered 2017-12-06: 20 mg via INTRAVENOUS

## 2017-12-06 MED ORDER — ONDANSETRON HCL 4 MG/2ML IJ SOLN
INTRAMUSCULAR | Status: DC | PRN
  Administered 2017-12-06: 4 mg via INTRAVENOUS

## 2017-12-06 MED ORDER — MIDAZOLAM HCL 2 MG/2ML IJ SOLN
INTRAMUSCULAR | Status: DC | PRN
  Administered 2017-12-06 (×2): 1 mg via INTRAVENOUS

## 2017-12-06 MED ORDER — SODIUM CHLORIDE 0.9 % IV SOLN: 250.0000 mL | INTRAVENOUS | Status: DC | PRN

## 2017-12-06 MED ORDER — PROTAMINE SULFATE 10 MG/ML IV SOLN
INTRAVENOUS | Status: AC
  Filled 2017-12-06: qty 5

## 2017-12-06 MED ORDER — ONDANSETRON HCL 4 MG/2ML IJ SOLN
INTRAMUSCULAR | Status: AC
  Filled 2017-12-06: qty 2

## 2017-12-06 MED ORDER — HEPARIN SODIUM (PORCINE) 1000 UNIT/ML IJ SOLN
INTRAMUSCULAR | Status: DC | PRN
  Administered 2017-12-06: 15000 [IU] via INTRAVENOUS

## 2017-12-06 MED ORDER — VANCOMYCIN HCL IN DEXTROSE 1-5 GM/200ML-% IV SOLN
1000.0000 mg | Freq: Once | INTRAVENOUS | Status: AC
  Administered 2017-12-06: 1000 mg via INTRAVENOUS
  Filled 2017-12-06: qty 200

## 2017-12-06 MED ORDER — ACETAMINOPHEN 650 MG RE SUPP: 650.0000 mg | Freq: Four times a day (QID) | RECTAL | Status: DC | PRN

## 2017-12-06 MED ORDER — PROPOFOL 10 MG/ML IV BOLUS
INTRAVENOUS | Status: DC | PRN
  Administered 2017-12-06: 110 mg via INTRAVENOUS

## 2017-12-06 MED ORDER — DEXAMETHASONE SODIUM PHOSPHATE 10 MG/ML IJ SOLN
INTRAMUSCULAR | Status: DC | PRN
  Administered 2017-12-06: 5 mg via INTRAVENOUS

## 2017-12-06 MED ORDER — METOPROLOL TARTRATE 5 MG/5ML IV SOLN
INTRAVENOUS | Status: AC
  Filled 2017-12-06: qty 5

## 2017-12-06 MED ORDER — MORPHINE SULFATE (PF) 4 MG/ML IV SOLN
INTRAVENOUS | Status: AC
  Filled 2017-12-06: qty 1

## 2017-12-06 MED ORDER — ENALAPRIL MALEATE 10 MG PO TABS
10.0000 mg | ORAL_TABLET | Freq: Every day | ORAL | Status: DC
  Administered 2017-12-06 – 2017-12-08 (×3): 10 mg via ORAL
  Filled 2017-12-06 (×4): qty 1

## 2017-12-06 MED ORDER — FENTANYL CITRATE (PF) 250 MCG/5ML IJ SOLN
INTRAMUSCULAR | Status: AC
  Filled 2017-12-06: qty 5

## 2017-12-06 MED ORDER — NITROGLYCERIN IN D5W 200-5 MCG/ML-% IV SOLN: 0.0000 ug/min | INTRAVENOUS | Status: DC

## 2017-12-06 MED ORDER — TRAMADOL HCL 50 MG PO TABS: 50.0000 mg | ORAL_TABLET | ORAL | Status: DC | PRN

## 2017-12-06 MED ORDER — SODIUM CHLORIDE 0.9% FLUSH
3.0000 mL | Freq: Two times a day (BID) | INTRAVENOUS | Status: DC
  Administered 2017-12-06 – 2017-12-08 (×4): 3 mL via INTRAVENOUS

## 2017-12-06 MED ORDER — MORPHINE SULFATE (PF) 10 MG/ML IV SOLN
2.0000 mg | INTRAVENOUS | Status: DC | PRN
  Administered 2017-12-06: 2 mg via INTRAVENOUS

## 2017-12-06 MED ORDER — INSULIN ASPART 100 UNIT/ML ~~LOC~~ SOLN
0.0000 [IU] | Freq: Three times a day (TID) | SUBCUTANEOUS | Status: DC
  Administered 2017-12-06: 20 [IU] via SUBCUTANEOUS
  Administered 2017-12-07 (×2): 2 [IU] via SUBCUTANEOUS
  Administered 2017-12-07: 5 [IU] via SUBCUTANEOUS
  Administered 2017-12-07 – 2017-12-08 (×2): 2 [IU] via SUBCUTANEOUS

## 2017-12-06 MED ORDER — CLOPIDOGREL BISULFATE 75 MG PO TABS
75.0000 mg | ORAL_TABLET | Freq: Every day | ORAL | Status: DC
  Administered 2017-12-07 – 2017-12-08 (×2): 75 mg via ORAL
  Filled 2017-12-06 (×2): qty 1

## 2017-12-06 MED ORDER — LEVOFLOXACIN IN D5W 750 MG/150ML IV SOLN
750.0000 mg | INTRAVENOUS | Status: AC
  Administered 2017-12-07: 750 mg via INTRAVENOUS
  Filled 2017-12-06: qty 150

## 2017-12-06 MED ORDER — METOPROLOL TARTRATE 5 MG/5ML IV SOLN: 2.5000 mg | INTRAVENOUS | Status: DC | PRN

## 2017-12-06 SURGICAL SUPPLY — 97 items
BAG DECANTER FOR FLEXI CONT (MISCELLANEOUS) IMPLANT
BAG SNAP BAND KOVER 36X36 (MISCELLANEOUS) ×8 IMPLANT
BLADE CLIPPER SURG (BLADE) IMPLANT
BLADE STERNUM SYSTEM 6 (BLADE) IMPLANT
CABLE ADAPT CONN TEMP 6FT (ADAPTER) ×4 IMPLANT
CANISTER SUCT 3000ML PPV (MISCELLANEOUS) IMPLANT
CANNULA FEM VENOUS REMOTE 22FR (CANNULA) IMPLANT
CANNULA OPTISITE PERFUSION 16F (CANNULA) IMPLANT
CANNULA OPTISITE PERFUSION 18F (CANNULA) IMPLANT
CATH DIAG EXPO 6F VENT PIG 145 (CATHETERS) ×4 IMPLANT
CATH EXPO 5FR AL1 (CATHETERS) ×4 IMPLANT
CATH INFINITI 6F AL2 (CATHETERS) IMPLANT
CATH S G BIP PACING (SET/KITS/TRAYS/PACK) ×4 IMPLANT
CATH SOFT-VU 4F 65 STRAIGHT (CATHETERS) ×2 IMPLANT
CATH SOFT-VU STRAIGHT 4F 65CM (CATHETERS) ×2
CLIP VESOCCLUDE MED 24/CT (CLIP) IMPLANT
CLIP VESOCCLUDE SM WIDE 24/CT (CLIP) ×4 IMPLANT
CONT SPEC 4OZ CLIKSEAL STRL BL (MISCELLANEOUS) ×8 IMPLANT
COVER BACK TABLE 80X110 HD (DRAPES) IMPLANT
COVER DOME SNAP 22 D (MISCELLANEOUS) IMPLANT
COVER WAND RF STERILE (DRAPES) ×4 IMPLANT
CRADLE DONUT ADULT HEAD (MISCELLANEOUS) ×4 IMPLANT
DERMABOND ADVANCED (GAUZE/BANDAGES/DRESSINGS) ×2
DERMABOND ADVANCED .7 DNX12 (GAUZE/BANDAGES/DRESSINGS) ×2 IMPLANT
DEVICE CLOSURE PERCLS PRGLD 6F (VASCULAR PRODUCTS) ×6 IMPLANT
DRAPE INCISE IOBAN 66X45 STRL (DRAPES) ×4 IMPLANT
DRSG TEGADERM 4X4.75 (GAUZE/BANDAGES/DRESSINGS) ×8 IMPLANT
DRYSEAL FLEXSHEATH 18FR 33CM (SHEATH) ×2
ELECT CAUTERY BLADE 6.4 (BLADE) IMPLANT
ELECT REM PT RETURN 9FT ADLT (ELECTROSURGICAL) ×8
ELECTRODE REM PT RTRN 9FT ADLT (ELECTROSURGICAL) ×4 IMPLANT
Edwards Balloon Catheter 20x4cmx130cm ×4 IMPLANT
FELT TEFLON 6X6 (MISCELLANEOUS) IMPLANT
FEMORAL VENOUS CANN RAP (CANNULA) IMPLANT
GAUZE SPONGE 4X4 12PLY STRL (GAUZE/BANDAGES/DRESSINGS) ×4 IMPLANT
GAUZE SPONGE 4X4 12PLY STRL LF (GAUZE/BANDAGES/DRESSINGS) ×4 IMPLANT
GLOVE BIO SURGEON STRL SZ7.5 (GLOVE) IMPLANT
GLOVE BIO SURGEON STRL SZ8 (GLOVE) ×4 IMPLANT
GLOVE EUDERMIC 7 POWDERFREE (GLOVE) IMPLANT
GLOVE ORTHO TXT STRL SZ7.5 (GLOVE) ×4 IMPLANT
GOWN STRL REUS W/ TWL LRG LVL3 (GOWN DISPOSABLE) ×4 IMPLANT
GOWN STRL REUS W/ TWL XL LVL3 (GOWN DISPOSABLE) ×6 IMPLANT
GOWN STRL REUS W/TWL LRG LVL3 (GOWN DISPOSABLE) ×4
GOWN STRL REUS W/TWL XL LVL3 (GOWN DISPOSABLE) ×6
GUIDEWIRE SAF TJ AMPL .035X180 (WIRE) ×4 IMPLANT
GUIDEWIRE SAFE TJ AMPLATZ EXST (WIRE) ×4 IMPLANT
GUIDEWIRE STRAIGHT .035 260CM (WIRE) ×4 IMPLANT
INSERT FOGARTY SM (MISCELLANEOUS) IMPLANT
KIT BASIN OR (CUSTOM PROCEDURE TRAY) ×4 IMPLANT
KIT DILATOR VASC 18G NDL (KITS) ×4 IMPLANT
KIT HEART LEFT (KITS) ×4 IMPLANT
KIT SUCTION CATH 14FR (SUCTIONS) IMPLANT
KIT TURNOVER KIT B (KITS) ×4 IMPLANT
LOOP VESSEL MAXI BLUE (MISCELLANEOUS) IMPLANT
LOOP VESSEL MINI RED (MISCELLANEOUS) IMPLANT
NEEDLE PERC 18GX7CM (NEEDLE) ×4 IMPLANT
NS IRRIG 1000ML POUR BTL (IV SOLUTION) ×4 IMPLANT
PACK ENDOVASCULAR (PACKS) ×4 IMPLANT
PAD ARMBOARD 7.5X6 YLW CONV (MISCELLANEOUS) ×8 IMPLANT
PAD ELECT DEFIB RADIOL ZOLL (MISCELLANEOUS) ×4 IMPLANT
PENCIL BUTTON HOLSTER BLD 10FT (ELECTRODE) IMPLANT
PERCLOSE PROGLIDE 6F (VASCULAR PRODUCTS) ×12
SET MICROPUNCTURE 5F STIFF (MISCELLANEOUS) ×4 IMPLANT
SHEATH BRITE TIP 6FR 35CM (SHEATH) ×4 IMPLANT
SHEATH BRITE TIP 8FR 23CM (SHEATH) ×4 IMPLANT
SHEATH DRYSEAL FLEX 18FR 33CM (SHEATH) ×2 IMPLANT
SHEATH PINNACLE 6F 10CM (SHEATH) IMPLANT
SHEATH PINNACLE 8F 10CM (SHEATH) IMPLANT
SLEEVE REPOSITIONING LENGTH 30 (MISCELLANEOUS) ×4 IMPLANT
SPONGE LAP 4X18 RFD (DISPOSABLE) ×4 IMPLANT
STOPCOCK MORSE 400PSI 3WAY (MISCELLANEOUS) ×8 IMPLANT
SUT ETHIBOND X763 2 0 SH 1 (SUTURE) IMPLANT
SUT GORETEX CV 4 TH 22 36 (SUTURE) IMPLANT
SUT GORETEX CV4 TH-18 (SUTURE) IMPLANT
SUT MNCRL AB 3-0 PS2 18 (SUTURE) IMPLANT
SUT PROLENE 5 0 C 1 36 (SUTURE) IMPLANT
SUT PROLENE 6 0 C 1 30 (SUTURE) IMPLANT
SUT SILK  1 MH (SUTURE) ×2
SUT SILK 1 MH (SUTURE) ×2 IMPLANT
SUT VIC AB 2-0 CT1 27 (SUTURE)
SUT VIC AB 2-0 CT1 TAPERPNT 27 (SUTURE) IMPLANT
SUT VIC AB 2-0 CTX 36 (SUTURE) IMPLANT
SUT VIC AB 3-0 SH 8-18 (SUTURE) IMPLANT
SYR 50ML LL SCALE MARK (SYRINGE) ×4 IMPLANT
SYR BULB IRRIGATION 50ML (SYRINGE) IMPLANT
SYR CONTROL 10ML LL (SYRINGE) IMPLANT
SYS DEL EVOLUT PROPLS 23 26 29 (MISCELLANEOUS) ×4
SYS LOAD EVOLT PROPLS 23 26 29 (MISCELLANEOUS) ×4
SYSTEM DEL EVLT PRPLS 23 26 29 (MISCELLANEOUS) ×2 IMPLANT
SYSTEM LOAD EVLT PRPLS23 26 29 (MISCELLANEOUS) ×2 IMPLANT
TAPE CLOTH SURG 4X10 WHT LF (GAUZE/BANDAGES/DRESSINGS) ×4 IMPLANT
TOWEL GREEN STERILE (TOWEL DISPOSABLE) ×8 IMPLANT
TRANSDUCER W/STOPCOCK (MISCELLANEOUS) ×8 IMPLANT
TRAY FOLEY SLVR 14FR TEMP STAT (SET/KITS/TRAYS/PACK) IMPLANT
TUBE SUCT INTRACARD DLP 20F (MISCELLANEOUS) IMPLANT
VALVE AORTIC EVOLUT PROPLUS 26 (Tissue) ×4 IMPLANT
WIRE EMERALD 3MM-J .035X150CM (WIRE) ×4 IMPLANT

## 2017-12-06 NOTE — Op Note (Signed)
[facility]   MULTIDISCIPLINARY HEART VALVE TEAM   TAVR OPERATIVE NOTE   Date of Procedure:  12/06/2017  Preoperative Diagnosis: Severe Aortic Stenosis   Postoperative Diagnosis: Same   Procedure:    Transcatheter Aortic Valve Replacement - Percutaneous Right Transfemoral Approach  Medtronic CoreValve Evolut Pro (size 26 mm, serial # [id number])   Co-Surgeons:  [name], MD and Valentina Gu. Roxy Manns, MD   Anesthesiologist:  Adele Barthel, MD  Echocardiographer:  Ena Dawley  Pre-operative Echo Findings:  Severe aortic stenosis  Normal left ventricular systolic function  Post-operative Echo Findings:  Mild paravalvular leak  Normal left ventricular systolic function   BRIEF CLINICAL NOTE AND INDICATIONS FOR SURGERY  Patient is a 77 year old morbidly obese female with history of aortic stenosis, hypertension, hyperlipidemia, type 2 diabetes mellitus without complications, fibromyalgia, fatty liver disease, and limited physical mobility who has been referred for surgical consultation to discuss treatment options for management of severe symptomatic stenosis.  Patient was noted to have a heart murmur on physical exam several years ago by her primary care physician.  She was diagnosed with aortic stenosis and referred to Dr. Wynonia Lawman who has been following her carefully ever since.  Echocardiograms have demonstrated a gradual progression in the severity of aortic stenosis.  She was referred to the multidisciplinary heart valve clinic and evaluated previously by Dr. Burt Knack.  At that time her aortic stenosis was moderate, bordering on severe the patient had no clear symptoms attributable to her aortic stenosis.  Continued close follow-up was recommended.  Over the past year the patient has developed worsening exertional shortness of breath and generalized weakness.  She has become progressively less mobile.  Over the past 3 months she had 2 frank syncopal episodes.   She was seen in follow-up by Dr. Wynonia Lawman and repeat echocardiogram revealed normal left ventricular systolic function with severe aortic stenosis.  Peak velocity across aortic valve measured 4.1 m/s corresponding to mean transvalvular gradient estimated 36 mmHg.  She was referred back to Dr. Burt Knack and left and right heart catheterization was performed November 02, 2017.  Catheterization confirmed the presence of severe aortic stenosis with mean transvalvular gradient measured 51.6 mmHg corresponding to aortic valve area calculated 0.82 cm.  The patient was noted to have mild nonobstructive coronary artery disease.  There was mild pulmonary hypertension.  CT angiography was performed and the patient was referred for surgical consultation.  During the course of the patient's preoperative work up they have been evaluated comprehensively by a multidisciplinary team of specialists coordinated through the [facility] in the [facility].  They have been demonstrated to suffer from symptomatic severe aortic stenosis as noted above. The patient has been counseled extensively as to the relative risks and benefits of all options for the treatment of severe aortic stenosis including long term medical therapy, conventional surgery for aortic valve replacement, and transcatheter aortic valve replacement.  All questions have been answered, and the patient provides full informed consent for the operation as described.   DETAILS OF THE OPERATIVE PROCEDURE  PREPARATION:    The patient is brought to the operating room on the above mentioned date and central monitoring was established by the anesthesia team including placement of a central venous line and radial arterial line. The patient is placed in the supine position on the operating table.  Intravenous antibiotics are administered.  General endotracheal anesthesia is induced uneventfully.    Baseline transesophageal  echocardiogram was performed.  The patient's chest, abdomen, both groins, and both lower extremities are prepared and draped in a sterile manner. A time out procedure is performed.   PERIPHERAL ACCESS:    Using the modified Seldinger technique, femoral arterial and venous access was obtained with placement of extra long 6 Fr sheaths on the left side.  The diagnostic sheath placed in the left femoral artery is placed with a single Perclose catheter.  A pigtail diagnostic catheter was passed through the  arterial sheath under fluoroscopic guidance into the aortic root.  The pigtail catheter is positioned in the non-coronary sinus of Valsalva.  A temporary transvenous pacemaker catheter was passed through the left femoral venous sheath under fluoroscopic guidance into the right ventricle.  The pacemaker was tested to ensure stable lead placement and pacemaker capture.    TRANSFEMORAL ACCESS:   Percutaneous transfemoral access and sheath placement was performed by Dr. Burt Knack using ultrasound guidance.  The right common femoral artery was cannulated using a micropuncture needle and appropriate location was verified using hand injection angiogram.  A pair of Abbott Perclose percutaneous closure devices were placed and a 6 French sheath replaced into the femoral artery.  The patient was heparinized systemically and ACT verified > 250 seconds.    An 18 Fr transfemoral Gore DrySeal sheath was introduced into the right common femoral artery after progressively dilating over an Amplatz superstiff wire. An AL-1 catheter was used to direct a straight-tip exchange length wire across the native aortic valve into the left ventricle. This was exchanged out for a pigtail catheter and position was confirmed in the LV apex. The pigtail catheter was exchanged for Confida wire in the LV apex.  Echocardiography was utilized to confirm appropriate wire position and no sign of entanglement in the mitral subvalvular  apparatus.   BALLOON AORTIC VALVULOPLASTY:   Balloon aortic valvuloplasty was performed using a 20 mm valvuloplasty balloon.  Once optimal position was achieved, BAV was done under rapid ventricular pacing. The patient recovered well hemodynamically.    TRANSCATHETER HEART VALVE DEPLOYMENT:   A Medtronic CoreValve Evolut Pro transcatheter heart valve (size 26 mm, serial #[id number]) was prepared and crimped per manufacturer's guidelines, and the proper loading of the valve is confirmed on the [facility] Pro delivery system using flouroscopy. The valve and delivery system were advanced over the guidewire, through the sheath into the iliac arteries and aorta, and advanced across the aortic arch using flouroscopy. The valve was carefully positioned across the aortic valve annulus. Once appropriate position of the valve has been confirmed by angiographic assessment, the valve is deployed gradually to 80% using a short period of rapid pacing, at which time a second aortogram was performed to confirm the appropriate depth and position of deployment.  Once final position was confirmed, deployment was completed, the valve released, and the delivery system carefully removed from the aortic root. Valve function is assessed using echocardiography. There is felt to be mild paravalvular leak and no central aortic insufficiency.  The patient's hemodynamic recovery following valve deployment is good.     PROCEDURE COMPLETION:   The deployment system is and guidewire were removed and femoral artery closure performed by securing the Perclose sutures.  Protamine was administered once femoral arterial repair was complete. The temporary pacemaker, pigtail catheters and femoral sheaths were removed with manual pressure used for hemostasis.   The patient tolerated the procedure well and is transported to the surgical intensive care in stable condition. There were no immediate intraoperative complications. All sponge instrument  and needle counts are verified correct at completion of the operation.   No blood products were administered during the operation.  The patient received a total of 29 mL of intravenous contrast during the procedure.   [name], MD 12/06/2017 2:48 PM

## 2017-12-06 NOTE — Transfer of Care (Signed)
Immediate Anesthesia Transfer of Care Note  Patient: [name]  Procedure(s) Performed: TRANSCATHETER AORTIC VALVE REPLACEMENT, TRANSFEMORAL (N/A Chest) TRANSESOPHAGEAL ECHOCARDIOGRAM (TEE) (N/A )  Patient Location: Cath Lab  Anesthesia Type:General  Level of Consciousness: awake, alert  and oriented  Airway & Oxygen Therapy: Patient Spontanous Breathing and Patient connected to nasal cannula oxygen  Post-op Assessment: Report given to RN  Post vital signs: Reviewed and stable  Last Vitals:  Vitals Value Taken Time  BP    Temp    Pulse 84 12/06/2017  3:05 PM  Resp 23 12/06/2017  3:05 PM  SpO2 98 % 12/06/2017  3:05 PM  Vitals shown include unvalidated device data.  Last Pain:  Vitals:   12/06/17 1504  TempSrc:   PainSc: 5       Patients Stated Pain Goal: 2 (28/41/32 4401)  Complications: No apparent anesthesia complications

## 2017-12-06 NOTE — Anesthesia Procedure Notes (Signed)
Procedure Name: Intubation Date/Time: 12/06/2017 12:37 PM Performed by: Barrington Ellison, CRNA Pre-anesthesia Checklist: Patient identified, Emergency Drugs available, Suction available and Patient being monitored Patient Re-evaluated:Patient Re-evaluated prior to induction Oxygen Delivery Method: Circle System Utilized Preoxygenation: Pre-oxygenation with 100% oxygen Induction Type: IV induction Ventilation: Mask ventilation without difficulty Laryngoscope Size: Mac and 3 Grade View: Grade I Tube type: Oral Tube size: 7.5 mm Number of attempts: 1 Airway Equipment and Method: Stylet and Oral airway Placement Confirmation: ETT inserted through vocal cords under direct vision,  positive ETCO2 and breath sounds checked- equal and bilateral Secured at: 21 cm Tube secured with: Tape Dental Injury: Teeth and Oropharynx as per pre-operative assessment

## 2017-12-06 NOTE — Anesthesia Procedure Notes (Signed)
Arterial Line Insertion Start/End10/15/2019 11:50 AM, 12/06/2017 11:53 AM Performed by: Wilburn Cornelia, CRNA, CRNA  Patient location: Pre-op. Preanesthetic checklist: patient identified, IV checked, site marked, risks and benefits discussed, surgical consent, monitors and equipment checked, pre-op evaluation, timeout performed and anesthesia consent Lidocaine 1% used for infiltration and patient sedated Left, radial was placed Catheter size: 20 G Hand hygiene performed  and maximum sterile barriers used  Allen's test indicative of satisfactory collateral circulation Attempts: 1 Procedure performed without using ultrasound guided technique. Following insertion, Biopatch and dressing applied. Post procedure assessment: normal

## 2017-12-06 NOTE — Progress Notes (Signed)
Propofol 80cc wasted in the black medication container. Witness by Shaaron Adler RN

## 2017-12-06 NOTE — Progress Notes (Signed)
  [facility] VALVE TEAM  Patient doing well s/p TAVR. She is hemodynamically stable. Bp has been moderately elevated. I stat labs show creat back to baseline ~0.9. Will resume home enalapril with BP elevation.  Groin sites stable. ECG with new LBBB but no high grade block. Plan to discontinue arterial line and transfer to 4E. Plan for early ambulation after bedrest completed and hopeful discharge over the next 24-48 hours.   Angelena Form PA-C  [facility]  Pager [phone]

## 2017-12-06 NOTE — Op Note (Signed)
[facility]   MULTIDISCIPLINARY HEART VALVE TEAM   TAVR OPERATIVE NOTE   Date of Procedure:  12/06/2017  Preoperative Diagnosis: Severe Aortic Stenosis   Postoperative Diagnosis: Same   Procedure:    Transcatheter Aortic Valve Replacement - Percutaneous Transfemoral Approach  Medtronic Evolut Pro-Plus (size 26 mm, serial # [id number])   Co-Surgeons:  Valentina Gu. Roxy Manns, MD and [name], MD  Anesthesiologist:  Adele Barthel, MD  Echocardiographer:  Ena Dawley, MD  Pre-operative Echo Findings:  Severe aortic stenosis  Normal left ventricular systolic function  Post-operative Echo Findings:  Mild paravalvular leak  Normal left ventricular systolic function  BRIEF CLINICAL NOTE AND INDICATIONS FOR SURGERY  77 year old woman with major comorbid conditions of morbid obesity, hypertension, hyperlipidemia, type 2 diabetes, fatty liver disease, and severe physical immobility presenting for TAVR for treatment of severe symptomatic aortic stenosis.  During the course of the patient's preoperative work up they have been evaluated comprehensively by a multidisciplinary team of specialists coordinated through the [facility] in the [facility].  They have been demonstrated to suffer from symptomatic severe aortic stenosis as noted above. The patient has been counseled extensively as to the relative risks and benefits of all options for the treatment of severe aortic stenosis including long term medical therapy, conventional surgery for aortic valve replacement, and transcatheter aortic valve replacement.  The patient has been independently evaluated by Dr Roxy Manns with cardiac surgery and is felt to be at high risk for conventional surgical aortic valve replacement.  Based upon review of all of the patient's preoperative diagnostic tests they are felt to be candidate for transcatheter aortic valve replacement using the  transfemoral approach as an alternative to high risk conventional surgery.    Following the decision to proceed with transcatheter aortic valve replacement, a discussion has been held regarding what types of management strategies would be attempted intraoperatively in the event of life-threatening complications, including whether or not the patient would be considered a candidate for the use of cardiopulmonary bypass and/or conversion to open sternotomy for attempted surgical intervention.  The patient has been advised of a variety of complications that might develop peculiar to this approach including but not limited to risks of death, stroke, paravalvular leak, aortic dissection or other major vascular complications, aortic annulus rupture, device embolization, cardiac rupture or perforation, acute myocardial infarction, arrhythmia, heart block or bradycardia requiring permanent pacemaker placement, congestive heart failure, respiratory failure, renal failure, pneumonia, infection, other late complications related to structural valve deterioration or migration, or other complications that might ultimately cause a temporary or permanent loss of functional independence or other long term morbidity.  The patient provides full informed consent for the procedure as described and all questions were answered preoperatively.  DETAILS OF THE OPERATIVE PROCEDURE  PREPARATION:   The patient is brought to the operating room on the above mentioned date and central monitoring was established by the anesthesia team including placement of a central venous catheter and radial arterial line. The patient is placed in the supine position on the operating table.  Intravenous antibiotics are administered.  General endotracheal anesthesia is induced uneventfully.  Baseline transesophageal echocardiogram is performed. The patient's chest, abdomen, both groins, and both lower extremities are prepared and draped in a sterile  manner. A time out procedure is performed.   PERIPHERAL ACCESS:   Using ultrasound guidance, femoral arterial and venous access is obtained with placement of 6 Fr sheaths on the left side.  The femoral arteriotomy is pre-closed using normal technique. A pigtail diagnostic catheter was passed through the femoral arterial sheath under fluoroscopic guidance into the aortic root.  A temporary transvenous pacemaker catheter was passed through the femoral venous sheath under fluoroscopic guidance into the right ventricle.  The pacemaker was tested to ensure stable lead placement and pacemaker capture.   TRANSFEMORAL ACCESS:  A micropuncture technique is used to access the right femoral artery under fluoroscopic and ultrasound guidance.  2 Perclose devices are deployed at 10' and 2' positions to 'PreClose' the femoral artery. An 8 French sheath is placed and then an Amplatz Superstiff wire is advanced through the sheath. This is changed out for an 105 French transfemoral Dry-Seal sheath after progressively dilating over the Superstiff wire.  An AL-1 catheter was used to direct a straight-tip exchange length wire across the native aortic valve into the left ventricle. This was exchanged out for a pigtail catheter and position was confirmed in the LV apex. Simultaneous LV and Ao pressures were recorded.  The pigtail catheter was exchanged for an Amplatz Extra-stiff wire in the LV apex.  Echocardiography was utilized to confirm appropriate wire position and no sign of entanglement in the mitral subvalvular apparatus.  BALLOON AORTIC VALVULOPLASTY:  Balloon aortic valvuloplasty was performed using a 20 mm valvuloplasty balloon.  Once optimal position was achieved, BAV was done under rapid ventricular pacing. The patient recovered well hemodynamically.   TRANSCATHETER HEART VALVE DEPLOYMENT:  A Medtronic Evolut Pro-Plus transcatheter heart valve (size 26 mm, serial # [id number]) was prepared and crimped per  manufacturer's guidelines, and the proper orientation of the valve is confirmed. The valve was advanced through the introducer sheath and then further advanced across the aortic arch and across the aortic valve annulus, using the pigtail catheter as a landmark.  The valve was then slowly deployed under careful fluoroscopic and angiographic guidance until it is 80% deployed.  TEE imaging is performed and demonstrates adequate position.  The valve was then released using normal protocol.  There is felt to be mild paravalvular leak and no central aortic insufficiency.  The patient's hemodynamic recovery following valve deployment is good. Echo demostrated acceptable post-procedural gradients, stable mitral valve function, and trace to mild aortic insufficiency.     PROCEDURE COMPLETION:  The sheath was removed and femoral artery closure is performed using the 2 previously deployed Perclose devices.  Protamine is administered once femoral arterial repair was complete. The site is clear with no evidence of bleeding or hematoma after the sutures are tightened. The temporary pacemaker, pigtail catheters and femoral sheaths were removed with manual pressure used for hemostasis.  The left femoral arterial sheath is removed and a Perclose device is used for hemostasis.  The patient tolerated the procedure well and is transported to the surgical intensive care in stable condition. There were no immediate intraoperative complications. All sponge instrument and needle counts are verified correct at completion of the operation.   The patient received a total of 29 mL of intravenous contrast during the procedure.   [name], MD 12/06/2017 2:27 PM

## 2017-12-06 NOTE — Progress Notes (Addendum)
Patient arrived from cath labe to 4e22. Patient with B groins  perclose per report. Level 0. No evidence at this time of bleeding or hematoma.   Vital signs obtained and patient placed on monitor and CCmd made aware. Will monitor patient Larayah Clute, Bettina Gavia RN

## 2017-12-06 NOTE — Anesthesia Preprocedure Evaluation (Addendum)
Anesthesia Evaluation  Patient identified by MRN, date of birth, ID band Patient awake    Reviewed: Allergy & Precautions, NPO status , Patient's Chart, lab work & pertinent test results  Airway Mallampati: II  TM Distance: >3 FB Neck ROM: Full    Dental  (+) Chipped,    Pulmonary former smoker,    Pulmonary exam normal breath sounds clear to auscultation       Cardiovascular hypertension, Pt. on medications and Pt. on home beta blockers  Rhythm:Regular Rate:Normal + Systolic murmurs ECG: NSR, rate 79  CATH: Mid RCA lesion is 10% stenosed. Prox LAD to Mid LAD lesion is 10% stenosed.   1. Mild non-obstructive CAD 2. Severe aortic stenosis (peak to peak gradient 62 mmHg, mean gradient 51.6 mmHg, AVA 0.82 cm2)   Neuro/Psych Anxiety negative neurological ROS     GI/Hepatic Fatty liver disease, nonalcoholic IBS (irritable bowel syndrome)   Endo/Other  diabetes, Oral Hypoglycemic AgentsMorbid obesity (Super)  Renal/GU negative Renal ROS     Musculoskeletal  (+) Fibromyalgia -  Abdominal (+) + obese,   Peds  Hematology HLD   Anesthesia Other Findings Severe Aortic Stenosis  Reproductive/Obstetrics                            Anesthesia Physical Anesthesia Plan  ASA: IV  Anesthesia Plan: General   Post-op Pain Management:    Induction: Intravenous  PONV Risk Score and Plan: 3 and Ondansetron, Dexamethasone, Midazolam and Treatment may vary due to age or medical condition  Airway Management Planned: Oral ETT  Additional Equipment: Arterial line, CVP and Ultrasound Guidance Line Placement  Intra-op Plan:   Post-operative Plan: Extubation in OR  Informed Consent: I have reviewed the patients History and Physical, chart, labs and discussed the procedure including the risks, benefits and alternatives for the proposed anesthesia with the patient or authorized representative who has  indicated his/her understanding and acceptance.   Dental advisory given  Plan Discussed with: CRNA  Anesthesia Plan Comments:       Anesthesia Quick Evaluation

## 2017-12-06 NOTE — Anesthesia Procedure Notes (Addendum)
Central Venous Catheter Insertion Performed by: Murvin Natal, MD, anesthesiologist Start/End10/15/2019 11:50 AM, 12/06/2017 12:00 PM Patient location: Pre-op. Preanesthetic checklist: patient identified, IV checked, site marked, risks and benefits discussed, surgical consent, monitors and equipment checked, pre-op evaluation, timeout performed and anesthesia consent Position: Trendelenburg Lidocaine 1% used for infiltration and patient sedated Hand hygiene performed , maximum sterile barriers used  and Seldinger technique used Catheter size: 8 Fr Total catheter length 16. Central line was placed.Double lumen Procedure performed using ultrasound guided technique. Ultrasound Notes:anatomy identified, needle tip was noted to be adjacent to the nerve/plexus identified, no ultrasound evidence of intravascular and/or intraneural injection and image(s) printed for medical record Attempts: 1 Following insertion, dressing applied, line sutured and Biopatch. Post procedure assessment: blood return through all ports, free fluid flow and no air  Patient tolerated the procedure well with no immediate complications.

## 2017-12-06 NOTE — Interval H&P Note (Signed)
History and Physical Interval Note:  12/06/2017 11:54 AM  [name]  has presented today for surgery, with the diagnosis of Severe Aortic Stenosis  The various methods of treatment have been discussed with the patient and family. After consideration of risks, benefits and other options for treatment, the patient has consented to  Procedure(s): TRANSCATHETER AORTIC VALVE REPLACEMENT, TRANSFEMORAL (N/A) TRANSESOPHAGEAL ECHOCARDIOGRAM (TEE) (N/A) as a surgical intervention .  The patient's history has been reviewed, patient examined, no change in status, stable for surgery.  I have reviewed the patient's chart and labs.  Questions were answered to the patient's satisfaction.     [name]

## 2017-12-06 NOTE — Anesthesia Postprocedure Evaluation (Signed)
Anesthesia Post Note  Patient: [name]  Procedure(s) Performed: TRANSCATHETER AORTIC VALVE REPLACEMENT, TRANSFEMORAL (N/A Chest) TRANSESOPHAGEAL ECHOCARDIOGRAM (TEE) (N/A )     Patient location during evaluation: Cath Lab Anesthesia Type: General Level of consciousness: awake and alert Pain management: pain level controlled Vital Signs Assessment: post-procedure vital signs reviewed and stable Respiratory status: spontaneous breathing, nonlabored ventilation, respiratory function stable and patient connected to nasal cannula oxygen Cardiovascular status: blood pressure returned to baseline and stable Postop Assessment: no apparent nausea or vomiting Anesthetic complications: no    Last Vitals:  Vitals:   12/06/17 1615 12/06/17 1625  BP: (!) 144/80 (!) 138/44  Pulse: 81 81  Resp: 17 18  Temp:    SpO2: 90% 94%    Last Pain:  Vitals:   12/06/17 1504  TempSrc:   PainSc: 5                  Tashi Band P Eissa Buchberger

## 2017-12-07 ENCOUNTER — Inpatient Hospital Stay (HOSPITAL_COMMUNITY): Payer: Medicare Other

## 2017-12-07 ENCOUNTER — Encounter (HOSPITAL_COMMUNITY): Payer: Self-pay | Admitting: Physician Assistant

## 2017-12-07 DIAGNOSIS — I350 Nonrheumatic aortic (valve) stenosis: Principal | ICD-10-CM

## 2017-12-07 DIAGNOSIS — Z952 Presence of prosthetic heart valve: Secondary | ICD-10-CM

## 2017-12-07 DIAGNOSIS — I5030 Unspecified diastolic (congestive) heart failure: Secondary | ICD-10-CM

## 2017-12-07 LAB — GLUCOSE, CAPILLARY
GLUCOSE-CAPILLARY: 142 mg/dL — AB (ref 70–99)
Glucose-Capillary: 145 mg/dL — ABNORMAL HIGH (ref 70–99)
Glucose-Capillary: 198 mg/dL — ABNORMAL HIGH (ref 70–99)
Glucose-Capillary: 142 mg/dL — ABNORMAL HIGH (ref 70–99)

## 2017-12-07 LAB — BASIC METABOLIC PANEL
ANION GAP: 6 (ref 5–15)
BUN: 22 mg/dL (ref 8–23)
CO2: 26 mmol/L (ref 22–32)
CREATININE: 0.99 mg/dL (ref 0.44–1.00)
Calcium: 8.5 mg/dL — ABNORMAL LOW (ref 8.9–10.3)
Chloride: 105 mmol/L (ref 98–111)
GFR, EST NON AFRICAN AMERICAN: 54 mL/min — AB (ref >60)
Glucose, Bld: 198 mg/dL — ABNORMAL HIGH (ref 70–99)
Potassium: 4.6 mmol/L (ref 3.5–5.1)
SODIUM: 137 mmol/L (ref 135–145)

## 2017-12-07 LAB — CBC
HCT: 33.3 % — ABNORMAL LOW (ref 36.0–46.0)
Hemoglobin: 9.8 g/dL — ABNORMAL LOW (ref 12.0–15.0)
MCH: 27.6 pg (ref 26.0–34.0)
MCHC: 29.4 g/dL — AB (ref 30.0–36.0)
MCV: 93.8 fL (ref 80.0–100.0)
NRBC: 0 % (ref 0.0–0.2)
PLATELETS: 151 10*3/uL (ref 150–400)
RBC: 3.55 MIL/uL — ABNORMAL LOW (ref 3.87–5.11)
RDW: 14.6 % (ref 11.5–15.5)
WBC: 10.7 10*3/uL — AB (ref 4.0–10.5)

## 2017-12-07 LAB — ECHOCARDIOGRAM COMPLETE
Height: 61.5 in
WEIGHTICAEL: 4874.81 [oz_av]

## 2017-12-07 LAB — MAGNESIUM: MAGNESIUM: 2.4 mg/dL (ref 1.7–2.4)

## 2017-12-07 MED ORDER — CYCLOBENZAPRINE HCL 10 MG PO TABS: 10.0000 mg | ORAL_TABLET | Freq: Three times a day (TID) | ORAL | Status: DC | PRN

## 2017-12-07 MED ORDER — FUROSEMIDE 80 MG PO TABS
80.0000 mg | ORAL_TABLET | Freq: Two times a day (BID) | ORAL | Status: DC
  Administered 2017-12-07 – 2017-12-08 (×2): 80 mg via ORAL
  Filled 2017-12-07 (×3): qty 1

## 2017-12-07 MED ORDER — POTASSIUM CHLORIDE CRYS ER 20 MEQ PO TBCR
40.0000 meq | EXTENDED_RELEASE_TABLET | Freq: Two times a day (BID) | ORAL | Status: DC
  Administered 2017-12-07 – 2017-12-08 (×3): 40 meq via ORAL
  Filled 2017-12-07 (×3): qty 2

## 2017-12-07 MED ORDER — ATORVASTATIN CALCIUM 40 MG PO TABS
40.0000 mg | ORAL_TABLET | Freq: Every day | ORAL | Status: DC
  Administered 2017-12-07 – 2017-12-08 (×2): 40 mg via ORAL
  Filled 2017-12-07 (×2): qty 1

## 2017-12-07 MED FILL — Morphine Sulfate Inj 10 MG/ML: INTRAMUSCULAR | Qty: 0.2 | Status: CN

## 2017-12-07 MED FILL — Morphine Sulfate Inj 4 MG/ML: INTRAMUSCULAR | Qty: 0.5 | Status: AC

## 2017-12-07 NOTE — Progress Notes (Addendum)
[name] VALVE TEAM  Patient Name: [name] Date of Encounter: 12/07/2017  Primary Cardiologist: Dr. Wynonia Lawman / Dr. Burt Knack & Dr. Roxy Manns (TAVR)    Hospital Problem List     Principal Problem:   S/P TAVR (transcatheter aortic valve replacement) Active Problems:   Severe aortic stenosis   NAFLD (nonalcoholic fatty liver disease)   Morbid obesity ([place])   Psoriasis   Hypertension   Hyperlipemia   Fibromyalgia   Diabetes mellitus without complication ([place])    Subjective   She can really tell a difference in how she is feeling. She still gets some dyspnea with exertion but it is already much improved.   Inpatient Medications    Scheduled Meds: . aspirin  81 mg Oral Daily  . atorvastatin  40 mg Oral Daily  . clopidogrel  75 mg Oral Q breakfast  . enalapril  10 mg Oral Daily  . furosemide  80 mg Oral BID  . insulin aspart  0-24 Units Subcutaneous TID AC & HS  . potassium chloride  40 mEq Oral BID  . sodium chloride flush  3 mL Intravenous Q12H   Continuous Infusions: . sodium chloride    . lactated ringers 50 mL/hr at 12/06/17 1109  . levofloxacin (LEVAQUIN) IV 750 mg (12/07/17 1014)  . nitroGLYCERIN    . phenylephrine (NEO-SYNEPHRINE) Adult infusion     PRN Meds: sodium chloride, acetaminophen **OR** acetaminophen, cyclobenzaprine, metoprolol tartrate, morphine injection, ondansetron (ZOFRAN) IV, oxyCODONE, sodium chloride flush, traMADol   Vital Signs    Vitals:   12/06/17 2344 12/07/17 0335 12/07/17 0600 12/07/17 0806  BP: (!) 121/51 102/78  (!) 108/52  Pulse: 73 71  65  Resp: 15 20 (!) 26 (!) 23  Temp: 97.6 F (36.4 C) 98.5 F (36.9 C)  98.5 F (36.9 C)  TempSrc: Oral Oral  Oral  SpO2: 100% 99% 97% 97%  Weight:   (!) 138.2 kg   Height:        Intake/Output Summary (Last 24 hours) at 12/07/2017 1106 Last data filed at 12/07/2017 0300 Gross per 24 hour  Intake 2361.32 ml  Output 150 ml  Net 2211.32 ml    Filed Weights   12/06/17 1103 12/07/17 0600  Weight: (!) 137.5 kg (!) 138.2 kg    Physical Exam   GEN: Well nourished, well developed, in no acute distress. Morbidly obese  HEENT: Grossly normal.  Neck: Supple, no JVD, carotid bruits, or masses. Cardiac: RRR, soft flow murmur. No rubs, or gallops. No clubbing, cyanosis, 1+ bilateral edema with chronic venous stasis.  Radials/DP/PT 2+ and equal bilaterally.  Respiratory:  Respirations regular and unlabored, clear to auscultation bilaterally. GI: Soft, nontender, nondistended, BS + x 4. MS: no deformity or atrophy. Skin: warm and dry, no rash.  Groin site are clear without hematoma or ecchymosis Neuro:  Strength and sensation are intact. Psych: AAOx3.  Normal affect.  Labs    CBC Recent Labs    12/06/17 1504 12/07/17 0343  WBC  --  10.7*  HGB 10.9* 9.8*  HCT 32.0* 33.3*  MCV  --  93.8  PLT  --  841   Basic Metabolic Panel Recent Labs    12/06/17 1504 12/07/17 0343  NA 141 137  K 3.9 4.6  CL 107 105  CO2  --  26  GLUCOSE 189* 198*  BUN 24* 22  CREATININE 0.90 0.99  CALCIUM  --  8.5*  MG  --  2.4  Liver Function Tests No results for input(s): AST, ALT, ALKPHOS, BILITOT, PROT, ALBUMIN in the last 72 hours. No results for input(s): LIPASE, AMYLASE in the last 72 hours. Cardiac Enzymes No results for input(s): CKTOTAL, CKMB, CKMBINDEX, TROPONINI in the last 72 hours. BNP Invalid input(s): POCBNP D-Dimer No results for input(s): DDIMER in the last 72 hours. Hemoglobin A1C No results for input(s): HGBA1C in the last 72 hours. Fasting Lipid Panel No results for input(s): CHOL, HDL, LDLCALC, TRIG, CHOLHDL, LDLDIRECT in the last 72 hours. Thyroid Function Tests No results for input(s): TSH, T4TOTAL, T3FREE, THYROIDAB in the last 72 hours.  Invalid input(s): FREET3  Telemetry    Sinus with rare PVC - Personally Reviewed  ECG    NSR - Personally Reviewed  Radiology    Dg Chest Port 1 View  Result  Date: 12/06/2017 CLINICAL DATA:  Post TAVR, diabetes mellitus, hypertension, former smoker EXAM: PORTABLE CHEST 1 VIEW COMPARISON:  Portable exam 1740 hours compared to 12/02/2017 FINDINGS: RIGHT jugular catheter with tip projecting over SVC. Enlargement of cardiac silhouette post interval TAVR. Mediastinal contours and pulmonary vascularity normal. Lungs clear. No infiltrate, pleural effusion or pneumothorax. IMPRESSION: Enlargement of cardiac silhouette post TAVR. No acute abnormalities. Electronically Signed   By: Lavonia Dana M.D.   On: 12/06/2017 18:02    Cardiac Studies   TAVR OPERATIVE NOTE   Date of Procedure:                12/06/2017  Preoperative Diagnosis:      Severe Aortic Stenosis   Procedure:        Transcatheter Aortic Valve Replacement - Percutaneous Right Transfemoral Approach             Medtronic CoreValve Evolut Pro (size 26 mm, serial # [id number])              Co-Surgeons:                        [name], MD and Valentina Gu. Roxy Manns, MD   Pre-operative Echo Findings: ? Severe aortic stenosis ? Normal left ventricular systolic function  Post-operative Echo Findings: ? Mild paravalvular leak ? Normal left ventricular systolic function  _________________  Post operative echo 12/07/17: pending  Patient Profile     ROWYN [name] is a 77 y.o. female with a history of HTN, HLD, morbid obesity, fibromyalgia, NAFLD, DMT2, chronic diastolic CHF and severe AS who [facility] to [facility]2017 for planned TAVR.   Assessment & Plan    Severe AS: s/p successful TAVR with a 26 mm Medtronic CoreValve Evolut Pro via the TF approach on 12/06/17. Post operative echo completed but pending formal read. Groin sites are stable.  Initial ECG showed new LBBB, but now this appears to have resolved.  Continue on aspirin and Plavix.  Plan to keep her 1 more night for observation with hopeful discharge home tomorrow morning.  HTN: BP well controlled. Back on her home  ARB  DMT2: Continue sliding scale insulin.  Chronic diastolic CHF: she has been on lasix 80 mg TID. Now that her aortic valve is fixed we may be able to back off on this. I will reorder this for 80mg  BID.   SignedAngelena Form, PA-C  12/07/2017, 11:06 AM  Pager [phone]  Patient seen, examined. Available data reviewed. Agree with findings, assessment, and plan as outlined by Nell Range, PA-C.  The patient is independently interviewed and examined. She is sitting up in  a chair at the bedside. Heart is RRR with a 2/6 SEM at the RUSB, BL groin sites are clear, lungs are clear, there is no peripheral edema. Echo study looks excellent with normal function of the TAVR valve, mean gradient 7 mmHg. Anticipate DC home tomorrow after mobilizing the patient today. Otherwise as outlined above.   [name], M.D. 12/07/2017 5:03 PM

## 2017-12-07 NOTE — Discharge Instructions (Signed)

## 2017-12-07 NOTE — Progress Notes (Signed)
  Echocardiogram 2D Echocardiogram has been performed.  [name] 12/07/2017, 10:15 AM

## 2017-12-07 NOTE — Progress Notes (Signed)
CARDIAC REHAB PHASE I   PRE:  Rate/Rhythm: 75 SR  BP:  Sitting: 110/76        SaO2: 98 RA  MODE:  Ambulation: 470 ft   POST:  Rate/Rhythm: 93 SR  BP:  Sitting: 118/74        SaO2: 96 RA  1025 - 1105  Pt ambulated 470 ft with walker and minimal assistance. Pt had sob, but saturation was 94% RA. Pt has a walker and rollator at home. Discussed CRPII and was referred to [facility].    Philis Kendall, MS 12/07/2017 10:58 AM

## 2017-12-07 NOTE — Progress Notes (Addendum)
Pt ambulated 840 feet with front wheel walker, o2 sat remained in 90-98% in room air. Pt was slightly SOB when back to room, but said she doesn't need any o2. Pt has been off o2 since 2 am today. Pt sitting in recliner, BLE elevated. Bilateral groin site level zero. Bilateral Pedal pulses +1.  Call light within reach. Will continue to monitor.

## 2017-12-08 ENCOUNTER — Other Ambulatory Visit: Payer: Self-pay

## 2017-12-08 DIAGNOSIS — I350 Nonrheumatic aortic (valve) stenosis: Secondary | ICD-10-CM

## 2017-12-08 DIAGNOSIS — Z952 Presence of prosthetic heart valve: Secondary | ICD-10-CM

## 2017-12-08 LAB — CBC
HCT: 34.9 % — ABNORMAL LOW (ref 36.0–46.0)
HEMOGLOBIN: 10.5 g/dL — AB (ref 12.0–15.0)
MCH: 28 pg (ref 26.0–34.0)
MCHC: 30.1 g/dL (ref 30.0–36.0)
MCV: 93.1 fL (ref 80.0–100.0)
PLATELETS: 136 10*3/uL — AB (ref 150–400)
RBC: 3.75 MIL/uL — AB (ref 3.87–5.11)
RDW: 15.1 % (ref 11.5–15.5)
WBC: 9.8 10*3/uL (ref 4.0–10.5)
nRBC: 0 % (ref 0.0–0.2)

## 2017-12-08 LAB — BASIC METABOLIC PANEL
Anion gap: 8 (ref 5–15)
BUN: 25 mg/dL — ABNORMAL HIGH (ref 8–23)
CHLORIDE: 105 mmol/L (ref 98–111)
CO2: 26 mmol/L (ref 22–32)
CREATININE: 1.11 mg/dL — AB (ref 0.44–1.00)
Calcium: 8.7 mg/dL — ABNORMAL LOW (ref 8.9–10.3)
GFR, EST AFRICAN AMERICAN: 55 mL/min — AB (ref >60)
GFR, EST NON AFRICAN AMERICAN: 47 mL/min — AB (ref >60)
Glucose, Bld: 120 mg/dL — ABNORMAL HIGH (ref 70–99)
Potassium: 4.7 mmol/L (ref 3.5–5.1)
Sodium: 139 mmol/L (ref 135–145)

## 2017-12-08 LAB — GLUCOSE, CAPILLARY
GLUCOSE-CAPILLARY: 157 mg/dL — AB (ref 70–99)
Glucose-Capillary: 108 mg/dL — ABNORMAL HIGH (ref 70–99)

## 2017-12-08 MED ORDER — POTASSIUM CHLORIDE CRYS ER 20 MEQ PO TBCR: 40.0000 meq | EXTENDED_RELEASE_TABLET | Freq: Two times a day (BID) | ORAL | 6 refills | Status: DC

## 2017-12-08 MED ORDER — CLOPIDOGREL BISULFATE 75 MG PO TABS: 75.0000 mg | ORAL_TABLET | Freq: Every day | ORAL | 1 refills | Status: DC

## 2017-12-08 MED ORDER — FUROSEMIDE 80 MG PO TABS: 80.0000 mg | ORAL_TABLET | Freq: Two times a day (BID) | ORAL | Status: DC

## 2017-12-08 MED ORDER — ASPIRIN EC 81 MG PO TBEC: 81.0000 mg | DELAYED_RELEASE_TABLET | Freq: Every day | ORAL | 0 refills | Status: DC

## 2017-12-08 MED FILL — Potassium Chloride Inj 2 mEq/ML: INTRAVENOUS | Qty: 40 | Status: AC

## 2017-12-08 MED FILL — Magnesium Sulfate Inj 50%: INTRAMUSCULAR | Qty: 10 | Status: AC

## 2017-12-08 MED FILL — Heparin Sodium (Porcine) Inj 1000 Unit/ML: INTRAMUSCULAR | Qty: 30 | Status: AC

## 2017-12-08 NOTE — Progress Notes (Signed)
CARDIAC REHAB PHASE I   Pt has been ambulating in hallways, states an improvement in her breathing. Discussed importance of site care with pt. Encouraged continued mobility as pt is able, exercise guidelines given. Talked with pt about diabetes and her diet, diabetic diet given. Pt states she is going to be with her brother today, and her son tomorrow and over the weekend. Pt denies need for walker as she has one at home. Pt referred to [place]. Pt hopeful for d/c today.  [id number] Rufina Falco, RN BSN 12/08/2017 8:58 AM

## 2017-12-08 NOTE — Progress Notes (Signed)
IV and telemetry discontinued. CCMD notified. Patient tolerated well.   Emelda Fear, RN

## 2017-12-08 NOTE — Discharge Summary (Addendum)
[place] VALVE TEAM  Discharge Summary    Patient ID: [name] MRN: [id number]; DOB: 1941-01-25  Admit date: 12/06/2017 Discharge date: 12/08/2017  Primary Care Provider: Leonard Downing, MD  Primary Cardiologist: Dr. Wynonia Lawman / Dr. Burt Knack & Dr. Roxy Manns (TAVR)    Discharge Diagnoses    Principal Problem:   S/P TAVR (transcatheter aortic valve replacement) Active Problems:   Severe aortic stenosis   NAFLD (nonalcoholic fatty liver disease)   Morbid obesity ([name])   Psoriasis   Hypertension   Hyperlipemia   Fibromyalgia   Diabetes mellitus without complication (HCC)   Allergies Allergies  Allergen Reactions  . Sulfonamide Derivatives Anaphylaxis  . Castor Oil     "passed out" ? SYNCOPE ?  Marland Kitchen Amoxicillin Diarrhea and Other (See Comments)    Has patient had a PCN reaction causing immediate rash, facial/tongue/throat swelling, SOB or lightheadedness with hypotension: No Has patient had a PCN reaction causing severe rash involving mucus membranes or skin necrosis: No Has patient had a PCN reaction that required hospitalization: No Has patient had a PCN reaction occurring within the last 10 years: No If all of the above answers are "NO", then may proceed with Cephalosporin use.   . Demerol [Meperidine] Nausea And Vomiting  . Hydrocodone Nausea And Vomiting    Diagnostic Studies/Procedures    TAVR OPERATIVE NOTE   Date of Procedure:12/06/2017  Preoperative Diagnosis:Severe Aortic Stenosis   Procedure:   Transcatheter Aortic Valve Replacement - PercutaneousRightTransfemoral Approach Medtronic CoreValve Evolut Pro (size 63mm, serial # [id number])  Co-Surgeons:Kaiel Weide Burt Knack, MD andClarence H. Roxy Manns, MD   Pre-operative Echo Findings: ? Severe aortic stenosis ? Normalleft ventricular systolic function  Post-operative Echo  Findings: ? Mildparavalvular leak ? Normalleft ventricular systolic function  _________________  Post operative echo 12/07/17 Study Conclusions - Left ventricle: The cavity size was normal. There was moderate   concentric hypertrophy. Systolic function was normal. The   estimated ejection fraction was in the range of 55% to 60%. Wall   motion was normal; there were no regional wall motion   abnormalities. Doppler parameters are consistent with abnormal   left ventricular relaxation (grade 1 diastolic dysfunction).   Doppler parameters are consistent with high ventricular filling   pressure. - Aortic valve: A 68mm Medtronic CoreValve Evolut Pro TAVR   bioprosthesis was present. Transvalvular velocity was within the   normal range. There was no stenosis. There was no regurgitation.   Peak velocity (S): 207 cm/s. Mean gradient (S): 7 mm Hg. Valve   area (VTI): 1.9 cm^2. Valve area (Vmax): 1.55 cm^2. Valve area   (Vmean): 1.85 cm^2. - Mitral valve: Transvalvular velocity was within the normal range.   There was no evidence for stenosis. There was no regurgitation. - Right ventricle: The cavity size was normal. Wall thickness was   normal. Systolic function was normal. - Tricuspid valve: There was trivial regurgitation. - Pulmonary arteries: Systolic pressure was within the normal   range. PA peak pressure: 17 mm Hg (S).     History of Present Illness     [name] is a 77 y.o. female with a history of HTN, HLD, morbid obesity, fibromyalgia, NAFLD, DMT2, chronic diastolic CHF and severe AS who presented to [facility]2017 for planned TAVR.  Patient was noted to have a heart murmur on physical exam several years ago by her primary care physician. She was diagnosed with aortic stenosis and referred to Dr. Wynonia Lawman who has been  following her carefully ever since. Echocardiograms have demonstrated a gradual progression in the severity of aortic stenosis. She was referred to the  multidisciplinary heart valve clinic and evaluated previously by Dr. Burt Knack. At that time her aortic stenosis was moderate, bordering on severe the patient had no clear symptoms attributable to her aortic stenosis. Continued close follow-up was recommended. Over the past year the patient has developed worsening exertional shortness of breath and generalized weakness. She has become progressively less mobile. Over the past 3 months she had 2 frank syncopal episodes. She was seen in follow-up by Dr. Wynonia Lawman and repeat echocardiogram revealed normal left ventricular systolic function with severe aortic stenosis. Peak velocity across aortic valve measured 4.1 m/s corresponding to mean transvalvular gradient estimated 36 mmHg. She was referred back to Dr. Burt Knack and left and right heart catheterization was performed November 02, 2017.Catheterization confirmed the presence of severe aortic stenosis and mild nonobstructive coronary artery disease.   She was evaluated by the multidisciplinary valve team and felt to be a suitable candidate for TAVR, which was set up for 12/06/2017.  Hospital Course     Consultants: none  Severe AS:s/p successful TAVR with a 26 mm Medtronic CoreValve Evolut Pro via the TF approach on 12/06/17. Post operative echo showed EF 55%, normally functioning TAVR valve with no PVL and mean gradient of 7 mmHg.  Groin sites are stable.  Initial ECG showed new LBBB, but now this appears to have resolved.  Continue on aspirin and Plavix.  Plan for discharge home today with 1 week follow-up in the office.  HTN: BP well controlled. Back on her home Enalapril  DMT2:  Treated with sliding scale insulin here.  Resume home regimen at discharge, including meformin as it has been 48 hours since contrast exposure.  Chronic diastolic CHF: she has been on lasix 80 mg TID.  She has been resumed on a lower dose at 80 mg BID. _____________  Discharge Vitals Blood pressure 121/71, pulse 75,  temperature 97.9 F (36.6 C), temperature source Oral, resp. rate 14, height 5' 1.5" (1.562 m), weight (!) 139.3 kg, SpO2 98 %.  Filed Weights   12/06/17 1103 12/07/17 0600 12/08/17 0400  Weight: (!) 137.5 kg (!) 138.2 kg (!) 139.3 kg   VS:  BP 121/71 (BP Location: Left Arm)   Pulse 75   Temp 97.9 F (36.6 C) (Oral)   Resp 14   Ht 5' 1.5" (1.562 m)   Wt (!) 139.3 kg   SpO2 98%   BMI 57.09 kg/m    GEN: Well nourished, well developed, in no acute distress, morbidly obese HEENT: normal Neck: no JVD or masses Cardiac: RRR; 2/6 SEM. No  rubs, or gallops. 1+ LE edema with chronic venous stasis Respiratory:  clear to auscultation bilaterally, normal work of breathing GI: soft, nontender, nondistended, + BS MS: no deformity or atrophy Skin: warm and dry, no rash. Groin site are clear without hematoma or ecchymosis Neuro:  Alert and Oriented x 3, Strength and sensation are intact Psych: euthymic mood, full affect    Labs & Radiologic Studies    CBC Recent Labs    12/07/17 0343 12/08/17 0422  WBC 10.7* 9.8  HGB 9.8* 10.5*  HCT 33.3* 34.9*  MCV 93.8 93.1  PLT 151 338*   Basic Metabolic Panel Recent Labs    12/07/17 0343 12/08/17 0422  NA 137 139  K 4.6 4.7  CL 105 105  CO2 26 26  GLUCOSE 198* 120*  BUN 22  25*  CREATININE 0.99 1.11*  CALCIUM 8.5* 8.7*  MG 2.4  --    Liver Function Tests No results for input(s): AST, ALT, ALKPHOS, BILITOT, PROT, ALBUMIN in the last 72 hours. No results for input(s): LIPASE, AMYLASE in the last 72 hours. Cardiac Enzymes No results for input(s): CKTOTAL, CKMB, CKMBINDEX, TROPONINI in the last 72 hours. BNP Invalid input(s): POCBNP D-Dimer No results for input(s): DDIMER in the last 72 hours. Hemoglobin A1C No results for input(s): HGBA1C in the last 72 hours. Fasting Lipid Panel No results for input(s): CHOL, HDL, LDLCALC, TRIG, CHOLHDL, LDLDIRECT in the last 72 hours. Thyroid Function Tests No results for input(s): TSH,  T4TOTAL, T3FREE, THYROIDAB in the last 72 hours.  Invalid input(s): FREET3 _____________  Dg Chest 2 View  Result Date: 12/02/2017 CLINICAL DATA:  Preoperative for TAVR for severe aortic stenosis. EXAM: CHEST - 2 VIEW COMPARISON:  12/05/2016 chest radiograph. FINDINGS: Stable cardiomediastinal silhouette with mild cardiomegaly. No pneumothorax. No pleural effusion. Lungs appear clear, with no acute consolidative airspace disease and no pulmonary edema. IMPRESSION: Stable cardiomegaly without overt pulmonary edema. No active pulmonary disease. Electronically Signed   By: Ilona Sorrel M.D.   On: 12/02/2017 17:16   Ct Coronary Morph W/cta Cor W/score W/ca W/cm &/or Wo/cm  Addendum Date: 11/15/2017   ADDENDUM REPORT: 11/15/2017 08:51 CLINICAL DATA:  Aortic stenosis EXAM: Cardiac TAVR CT TECHNIQUE: The patient was scanned on a Siemens Force 622 slice scanner. A 120 kV retrospective scan was triggered in the descending thoracic aorta at 111 HU's. Gantry rotation speed was 270 msecs and collimation was .9 mm. No beta blockade or nitro were given. The 3D data set was reconstructed in 5% intervals of the R-R cycle. Systolic and diastolic phases were analyzed on a dedicated work station using MPR, MIP and VRT modes. The patient received 80 cc of contrast. FINDINGS: Aortic Valve: Tri leaflet and calcified with restricted leaflet motion Aorta: No aneurysm moderate calcific atherosclerosis Sinotubular Junction: 26 mm Ascending Thoracic Aorta: 32 mm Aortic Arch: 24 mm Descending Thoracic Aorta: 23 mm Sinus of Valsalva Measurements: Non-coronary: 28 mm Right - coronary: 26 mm Left - coronary: 25.7 mm Coronary Artery Height above Annulus: Left Main: 13 mm above annulus Right Coronary: 13.7 mm above annulus Virtual Basal Annulus Measurements: Maximum/Minimum Diameter: 24.4 mm x 19.9 mm Perimeter: 70.5 mm Area: 383 mm2 Coronary Arteries: Sufficient height above annulus for deployment Optimum Fluoroscopic Angle for  Delivery: LAO 23 degrees Caudal 1 degree IMPRESSION: 1. Calcified tri leaflet AV with annular area of 383 mm 2 suitable for a 23 mm Sapien 3 valve 2.  Coronary arteries sufficient height above annulus for deployment 3.  No LAA thrombus 4. Optimum angiographic angle for deployment LAO 23 degrees Caudal 1 degree Jenkins Rouge Electronically Signed   By: Jenkins Rouge M.D.   On: 11/15/2017 08:51   Result Date: 11/15/2017 EXAM: OVER-READ INTERPRETATION  CT CHEST The following report is an over-read performed by radiologist Dr. Vinnie Langton of [facility], [place] on 11/14/2017. This over-read does not include interpretation of cardiac or coronary anatomy or pathology. The coronary CTA interpretation by the cardiologist is attached. COMPARISON:  None. FINDINGS: Extracardiac findings will be described separately under dictation for contemporaneously obtained CTA chest, abdomen and pelvis. IMPRESSION: Please see separate dictation for contemporaneously obtained CTA chest, abdomen and pelvis 11/14/2017 for full description of relevant extracardiac findings. Electronically Signed: By: Vinnie Langton M.D. On: 11/14/2017 14:59   Dg Chest [facility]  Result Date: 12/06/2017 CLINICAL DATA:  Post TAVR, diabetes mellitus, hypertension, former smoker EXAM: PORTABLE CHEST 1 VIEW COMPARISON:  Portable exam 1740 hours compared to 12/02/2017 FINDINGS: RIGHT jugular catheter with tip projecting over SVC. Enlargement of cardiac silhouette post interval TAVR. Mediastinal contours and pulmonary vascularity normal. Lungs clear. No infiltrate, pleural effusion or pneumothorax. IMPRESSION: Enlargement of cardiac silhouette post TAVR. No acute abnormalities. Electronically Signed   By: Lavonia Dana M.D.   On: 12/06/2017 18:02   Ct Angio Chest Aorta W &/or Wo Contrast  Result Date: 11/14/2017 CLINICAL DATA:  77 year old female with history of severe aortic valve stenosis. Preprocedural study prior to potential transcatheter  aortic valve replacement (TAVR) procedure. EXAM: CT ANGIOGRAPHY CHEST, ABDOMEN AND PELVIS TECHNIQUE: Multidetector CT imaging through the chest, abdomen and pelvis was performed using the standard protocol during bolus administration of intravenous contrast. Multiplanar reconstructed images and MIPs were obtained and reviewed to evaluate the vascular anatomy. CONTRAST:  128mL OMNIPAQUE IOHEXOL 300 MG/ML  SOLN COMPARISON:  CT the abdomen and pelvis 12/24/2015. Chest CT 12/05/2016. FINDINGS: CTA CHEST FINDINGS Cardiovascular: Heart size is normal. There is no significant pericardial fluid, thickening or pericardial calcification. There is aortic atherosclerosis, as well as atherosclerosis of the great vessels of the mediastinum and the coronary arteries, including calcified atherosclerotic plaque in the right coronary artery. Severe thickening calcification of the aortic valve. Severe calcifications of the mitral annulus. Mediastinum/Lymph Nodes: No pathologically enlarged mediastinal or hilar lymph nodes. Esophagus is unremarkable in appearance. No axillary lymphadenopathy. Lungs/Pleura: Several small pulmonary nodules are scattered throughout the lungs bilaterally. The largest of these is in the left lower lobe anteriorly (axial image 63 of series 8) measuring 12 x 8 mm (mean diameter of 10 mm), stable compared to prior examination 12/05/2016. Multiple other smaller lesions are noted, many of which appear stable compared to the prior examination, but some of which were obscured by motion on the prior study. The largest other nodule is in the right upper lobe (axial image 23 of series 8) measuring 5 mm. No acute consolidative airspace disease. No pleural effusions. Musculoskeletal/Soft Tissues: There are no aggressive appearing lytic or blastic lesions noted in the visualized portions of the skeleton. CTA ABDOMEN AND PELVIS FINDINGS Hepatobiliary: Liver has a shrunken appearance and nodular contour, indicative of  underlying cirrhosis. There is also diffuse low attenuation throughout the hepatic parenchyma, indicative of severe hepatic steatosis. No discrete cystic or solid hepatic lesions. No intra or extrahepatic biliary ductal dilatation. Status post cholecystectomy. Pancreas: No pancreatic mass. No pancreatic ductal dilatation. No pancreatic or peripancreatic fluid or inflammatory changes. Spleen: Unremarkable. Adrenals/Urinary Tract: Subcentimeter low-attenuation lesion in the posterior aspect of the interpolar region of the left kidney. Right kidney and bilateral adrenal glands are normal in appearance. No hydroureteronephrosis. Urinary bladder is normal in appearance. Bilateral adrenal glands are normal in appearance. Stomach/Bowel: Normal appearance of the stomach. No pathologic dilatation of small bowel or colon. The appendix is not confidently identified and may be surgically absent. Regardless, there are no inflammatory changes noted adjacent to the cecum to suggest the presence of an acute appendicitis at this time. Vascular/Lymphatic: Aortic atherosclerosis, without evidence of aneurysm or dissection in the abdominal or pelvic vasculature. Vascular findings and measurements pertinent to potential TAVR procedure, as detailed below. Retroaortic left renal vein (normal anatomical variant) incidentally noted. No lymphadenopathy noted in the abdomen or pelvis. Reproductive: Status post hysterectomy. Ovaries are not confidently identified may be surgically absent or atrophic. Other: No significant volume of  ascites.  No pneumoperitoneum. Musculoskeletal: There are no aggressive appearing lytic or blastic lesions noted in the visualized portions of the skeleton. VASCULAR MEASUREMENTS PERTINENT TO TAVR: AORTA: Minimal Aortic Diameter-14 x 16 mm Severity of Aortic Calcification-mild RIGHT PELVIS: Right Common Iliac Artery - Minimal Diameter-10.6 x 9.2 mm Tortuosity-mild Calcification-mild Right External Iliac Artery -  Minimal Diameter-6.5 x 6.7 mm Tortuosity-mild Calcification-none Right Common Femoral Artery - Minimal Diameter-7.6 x 7.6 mm Tortuosity-mild Calcification-none LEFT PELVIS: Left Common Iliac Artery - Minimal Diameter-11.6 x 10.4 mm Tortuosity-mild Calcification-mild Left External Iliac Artery - Minimal Diameter-6.5 x 6.9 mm Tortuosity-mild Calcification-none Left Common Femoral Artery - Minimal Diameter-7.7 x 6.4 mm Tortuosity-mild Calcification-none Review of the MIP images confirms the above findings. IMPRESSION: 1. Vascular findings and measurements pertinent to potential TAVR procedure, as detailed above. 2. Severe thickening calcification of the aortic valve, compatible with the reported clinical history of severe aortic stenosis. 3. Mild cardiomegaly. 4. Aortic atherosclerosis, in addition to right coronary artery disease. 5. Hepatic steatosis with hepatic cirrhosis. 6. Additional incidental findings, as above. Electronically Signed   By: Vinnie Langton M.D.   On: 11/14/2017 15:54   Ct Angio Abdomen Pelvis  W &/or Wo Contrast  Result Date: 11/14/2017 CLINICAL DATA:  77 year old female with history of severe aortic valve stenosis. Preprocedural study prior to potential transcatheter aortic valve replacement (TAVR) procedure. EXAM: CT ANGIOGRAPHY CHEST, ABDOMEN AND PELVIS TECHNIQUE: Multidetector CT imaging through the chest, abdomen and pelvis was performed using the standard protocol during bolus administration of intravenous contrast. Multiplanar reconstructed images and MIPs were obtained and reviewed to evaluate the vascular anatomy. CONTRAST:  160mL OMNIPAQUE IOHEXOL 300 MG/ML  SOLN COMPARISON:  CT the abdomen and pelvis 12/24/2015. Chest CT 12/05/2016. FINDINGS: CTA CHEST FINDINGS Cardiovascular: Heart size is normal. There is no significant pericardial fluid, thickening or pericardial calcification. There is aortic atherosclerosis, as well as atherosclerosis of the great vessels of the mediastinum  and the coronary arteries, including calcified atherosclerotic plaque in the right coronary artery. Severe thickening calcification of the aortic valve. Severe calcifications of the mitral annulus. Mediastinum/Lymph Nodes: No pathologically enlarged mediastinal or hilar lymph nodes. Esophagus is unremarkable in appearance. No axillary lymphadenopathy. Lungs/Pleura: Several small pulmonary nodules are scattered throughout the lungs bilaterally. The largest of these is in the left lower lobe anteriorly (axial image 63 of series 8) measuring 12 x 8 mm (mean diameter of 10 mm), stable compared to prior examination 12/05/2016. Multiple other smaller lesions are noted, many of which appear stable compared to the prior examination, but some of which were obscured by motion on the prior study. The largest other nodule is in the right upper lobe (axial image 23 of series 8) measuring 5 mm. No acute consolidative airspace disease. No pleural effusions. Musculoskeletal/Soft Tissues: There are no aggressive appearing lytic or blastic lesions noted in the visualized portions of the skeleton. CTA ABDOMEN AND PELVIS FINDINGS Hepatobiliary: Liver has a shrunken appearance and nodular contour, indicative of underlying cirrhosis. There is also diffuse low attenuation throughout the hepatic parenchyma, indicative of severe hepatic steatosis. No discrete cystic or solid hepatic lesions. No intra or extrahepatic biliary ductal dilatation. Status post cholecystectomy. Pancreas: No pancreatic mass. No pancreatic ductal dilatation. No pancreatic or peripancreatic fluid or inflammatory changes. Spleen: Unremarkable. Adrenals/Urinary Tract: Subcentimeter low-attenuation lesion in the posterior aspect of the interpolar region of the left kidney. Right kidney and bilateral adrenal glands are normal in appearance. No hydroureteronephrosis. Urinary bladder is normal in appearance. Bilateral adrenal glands are  normal in appearance. Stomach/Bowel:  Normal appearance of the stomach. No pathologic dilatation of small bowel or colon. The appendix is not confidently identified and may be surgically absent. Regardless, there are no inflammatory changes noted adjacent to the cecum to suggest the presence of an acute appendicitis at this time. Vascular/Lymphatic: Aortic atherosclerosis, without evidence of aneurysm or dissection in the abdominal or pelvic vasculature. Vascular findings and measurements pertinent to potential TAVR procedure, as detailed below. Retroaortic left renal vein (normal anatomical variant) incidentally noted. No lymphadenopathy noted in the abdomen or pelvis. Reproductive: Status post hysterectomy. Ovaries are not confidently identified may be surgically absent or atrophic. Other: No significant volume of ascites.  No pneumoperitoneum. Musculoskeletal: There are no aggressive appearing lytic or blastic lesions noted in the visualized portions of the skeleton. VASCULAR MEASUREMENTS PERTINENT TO TAVR: AORTA: Minimal Aortic Diameter-14 x 16 mm Severity of Aortic Calcification-mild RIGHT PELVIS: Right Common Iliac Artery - Minimal Diameter-10.6 x 9.2 mm Tortuosity-mild Calcification-mild Right External Iliac Artery - Minimal Diameter-6.5 x 6.7 mm Tortuosity-mild Calcification-none Right Common Femoral Artery - Minimal Diameter-7.6 x 7.6 mm Tortuosity-mild Calcification-none LEFT PELVIS: Left Common Iliac Artery - Minimal Diameter-11.6 x 10.4 mm Tortuosity-mild Calcification-mild Left External Iliac Artery - Minimal Diameter-6.5 x 6.9 mm Tortuosity-mild Calcification-none Left Common Femoral Artery - Minimal Diameter-7.7 x 6.4 mm Tortuosity-mild Calcification-none Review of the MIP images confirms the above findings. IMPRESSION: 1. Vascular findings and measurements pertinent to potential TAVR procedure, as detailed above. 2. Severe thickening calcification of the aortic valve, compatible with the reported clinical history of severe aortic  stenosis. 3. Mild cardiomegaly. 4. Aortic atherosclerosis, in addition to right coronary artery disease. 5. Hepatic steatosis with hepatic cirrhosis. 6. Additional incidental findings, as above. Electronically Signed   By: Vinnie Langton M.D.   On: 11/14/2017 15:54   Disposition   Pt is being discharged home today in good condition.  Follow-up Plans & Appointments    Follow-up Information    Eileen Stanford, PA-C. Go on 12/14/2017.   Specialties:  Cardiology, Radiology Why:  @ 3:30pm. please arrive at least 10 minutes early Contact information: [place] [address] [phone]          Discharge Instructions    Amb Referral to Cardiac Rehabilitation   Complete by:  As directed    Referring to [facility] CRP 2   Diagnosis:  Valve Replacement   Valve:  Aortic Comment - TAVR      Discharge Medications   Allergies as of 12/08/2017      Reactions   Sulfonamide Derivatives Anaphylaxis   Castor Oil    "passed out" ? SYNCOPE ?   Amoxicillin Diarrhea, Other (See Comments)   Has patient had a PCN reaction causing immediate rash, facial/tongue/throat swelling, SOB or lightheadedness with hypotension: No Has patient had a PCN reaction causing severe rash involving mucus membranes or skin necrosis: No Has patient had a PCN reaction that required hospitalization: No Has patient had a PCN reaction occurring within the last 10 years: No If all of the above answers are "NO", then may proceed with Cephalosporin use.   Demerol [meperidine] Nausea And Vomiting   Hydrocodone Nausea And Vomiting      Medication List    STOP taking these medications   metoprolol tartrate 50 MG tablet Commonly known as:  LOPRESSOR     TAKE these medications   aspirin EC 81 MG tablet Take 1 tablet (81 mg total) by mouth daily. What  changed:  when to take this   atorvastatin 40 MG tablet Commonly known as:  LIPITOR Take 40 mg by mouth daily.   clopidogrel  75 MG tablet Commonly known as:  PLAVIX Take 1 tablet (75 mg total) by mouth daily with breakfast. Start taking on:  12/09/2017   Cranberry 250 MG Tabs Take 250 mg by mouth 2 (two) times daily as needed (bladder issues).   cyclobenzaprine 10 MG tablet Commonly known as:  FLEXERIL Take 10 mg by mouth 3 (three) times daily as needed for muscle spasms.   enalapril 10 MG tablet Commonly known as:  VASOTEC Take 10 mg by mouth daily.   furosemide 80 MG tablet Commonly known as:  LASIX Take 1 tablet (80 mg total) by mouth 2 (two) times daily. What changed:  when to take this   glimepiride 4 MG tablet Commonly known as:  AMARYL Take 8 mg by mouth daily with breakfast.   metFORMIN 1000 MG (MOD) 24 hr tablet Commonly known as:  GLUMETZA Take 1,000 mg by mouth 2 (two) times daily with a meal.   multivitamin with minerals Tabs tablet Take 1 tablet by mouth daily.   potassium chloride SA 20 MEQ tablet Commonly known as:  K-DUR,KLOR-CON Take 2 tablets (40 mEq total) by mouth 2 (two) times daily.   traMADol 50 MG tablet Commonly known as:  ULTRAM Take 50 mg by mouth every 6 (six) hours as needed for moderate pain.   Vitamin D-3 5000 units Tabs Take 5,000 Units by mouth daily.         Outstanding Labs/Studies   none  Duration of Discharge Encounter   Greater than 30 minutes including physician time.  Signed, Angelena Form, PA-C 12/08/2017, 1:20 PM [phone]  Patient seen, examined. Available data reviewed. Agree with findings, assessment, and plan as outlined by Nell Range, PA. Pt was seen and examined. Alert, oriented, NAD, lungs CTA, heart RRR with 2/6 SEM at the RUSB, BL groin sites clear, no leg edema. Tele showed NSR without arrhythmia. Echo shows normal LV function, normal function of TAVR valve. I reviewed post-TAVR restrictions and FU with the patient who understands. States she's already breathing much better.   [name], M.D. 12/09/2017 5:16  AM

## 2017-12-08 NOTE — Consult Note (Addendum)
            [facility] CM Primary Care Navigator  12/08/2017  SHARIS KEERAN 1940/05/30 [id number]   Went to see patient at the bedside to identify possible discharge needs butshewas alreadydischargedhomeper staff.  Per MD note,patientwas admitted for planned TAVR- transcatheter aortic valve replacement. On continued close follow-up, patient had demonstrated a gradual progression in the severity of aortic stenosis that led to this admission/ surgery.  Patient has discharge instruction to follow-up withcardiology on 12/14/17 and then, with a scheduled cardiology follow-up in 3 weeks.  Her primary care provider is listed as Dr. Claris Gower with [facility].  Per chart review, patient has previous insurance of [employer] [employer] but has current [employer]product [employer]. Membership roster was used to verify non-eligible status.    For additional questions please contact:  Edwena Felty A. Kayonna Lawniczak, BSN, RN-BC [facility] PRIMARY CARE Navigator Cell: [phone]

## 2017-12-08 NOTE — Progress Notes (Signed)
Discharge instructions reviewed with patient at this time. All questions answered.  

## 2017-12-09 ENCOUNTER — Telehealth: Payer: Self-pay | Admitting: Physician Assistant

## 2017-12-09 NOTE — Telephone Encounter (Signed)
  [facility]   MULTIDISCIPLINARY HEART VALVE TEAM  Attempted TCM phone call. No answer. Left a message to return call.  Angelena Form PA-C  [facility]

## 2017-12-13 NOTE — Progress Notes (Signed)
[facility]                                       Cardiology Office Note    Date:  12/15/2017   ID:  KYRA LAFFEY, DOB Nov 16, 1940, MRN [id number]  PCP:  Leonard Downing, MD  Cardiologist:  Dr. Wynonia Lawman / Dr. Burt Knack & Dr. Roxy Manns (TAVR)  CC: [facility] s/p TAVR  History of Present Illness:  [name] is a 77 y.o. female with a history of HTN, HLD, morbid obesity, fibromyalgia, NAFLD, DMT2, chronic diastolic CHFand severe ASwho [facility] for planned [facility]AVR.  Patient was noted to have a heart murmur on physical exam several years ago by her primary care physician. She was diagnosed with aortic stenosis and referred to Dr. Wynonia Lawman who has been following her carefully ever since. Echocardiograms have demonstrated a gradual progression in the severity of aortic stenosis. She was referred to the multidisciplinary heart valve clinic and evaluated previously by Dr. Burt Knack. At that time her aortic stenosis was moderate, bordering on severe the patient had no clear symptoms attributable to her aortic stenosis. Continued close follow-up was recommended. Over the past year the patient has developed worsening exertional shortness of breath and generalized weakness. She has become progressively less mobile. Over the past 3 months she had 2 frank syncopal episodes. She was seen in follow-up by Dr. Wynonia Lawman and repeat echocardiogram revealed normal left ventricular systolic function with severe aortic stenosis. Peak velocity across aortic valve measured 4.1 m/s corresponding to mean transvalvular gradient estimated 36 mmHg. She was referred back to Dr. Burt Knack and left and right heart catheterization was performed November 02, 2017.Catheterization confirmed the presence of severe aortic stenosis and mild nonobstructive coronary artery disease.   She was evaluated by the multidisciplinary valve team and underwent successful TAVR  with a51mm Medtronic CoreValve Evolut Provia the TF approach on 12/06/17. Post operative echo showed EF 55%, normally functioning TAVR valve with no PVL and mean gradient of 7 mmHg.  Initial ECG showed new LBBB, but this later resolved. She was discharged on aspirin and plavix and home lasix was cut back to 80mg  BID.  Today she presents to clinic for follow up.No CP or SOB. No LE edema, orthopnea or PND. She has had some dizziness but syncope. No blood in stool or urine. No palpitations. She has continues to loose water weight on reduced dose of lasix. She feels like she may be getting too dried out and wonders if we should but back further. She has been trying to walk more and loose true weight.     Past Medical History:  Diagnosis Date  . Abnormal thyroid biopsy 2018   results were negative.  . Anemia    "when I was alot younger" (12/06/2017)  . Anxiety    self reported  . Arthritis    "almost all over; used to cry w/it when I was in my teens" (12/06/2017)  . CHF (congestive heart failure) ([place])   . Chronic back pain    "all over" (12/06/2017)  . Depression    "lost my son last year to cancer; I tended to him; he lived w/me" (12/06/2017)  . Diverticulosis of colon (without mention of hemorrhage)   . External hemorrhoids without mention of complication   . Fatty liver disease, nonalcoholic   . Fibromyalgia   .  History of kidney stones   . History of stomach ulcers 1970  . Hyperlipemia   . Hypertension   . IBS (irritable bowel syndrome)   . Morbid obesity ([place])   . Psoriasis   . 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 Severe aortic stenosis   . Sinus headache   . Sleep apnea    "was told I do; never have had any problems w/it" (12/06/2017)  . Type II diabetes mellitus ([place])     Past Surgical History:  Procedure Laterality Date  . ABDOMINAL  ADHESION SURGERY     "took 2 out"  . ABDOMINAL HYSTERECTOMY    . APPENDECTOMY    . BACK SURGERY    . BIOPSY THYROID  2018   results were negative.  Marland Kitchen BREAST LUMPECTOMY Left X 1   "benign"  . CATARACT EXTRACTION W/ INTRAOCULAR LENS  IMPLANT, BILATERAL Bilateral   . CATARACT EXTRACTION W/ INTRAOCULAR LENS IMPLANT Bilateral 2017  . EYE SURGERY    . HEMORRHOID BANDING    . LAPAROSCOPIC CHOLECYSTECTOMY    . LEFT AND RIGHT HEART CATHETERIZATION WITH CORONARY ANGIOGRAM N/A 11/15/2013   Procedure: LEFT AND RIGHT HEART CATHETERIZATION WITH CORONARY ANGIOGRAM;  Surgeon: Blane Ohara, MD;  Location: [facility] CATH LAB;  Service: Cardiovascular;  Laterality: N/A;  . LUMBAR [place]    . RETINAL DETACHMENT SURGERY Right   . RIGHT/LEFT HEART CATH AND CORONARY ANGIOGRAPHY N/A 11/02/2017   Procedure: RIGHT/LEFT HEART CATH AND CORONARY ANGIOGRAPHY;  Surgeon: Burnell Blanks, MD;  Location: [facility] CV LAB;  Service: Cardiovascular;  Laterality: N/A;  . TEE WITHOUT CARDIOVERSION N/A 12/06/2017   Procedure: TRANSESOPHAGEAL ECHOCARDIOGRAM (TEE);  Surgeon: [name], MD;  Location: [facility];  Service: Open Heart Surgery;  Laterality: N/A;  . TONSILLECTOMY    . TRANSCATHETER AORTIC VALVE REPLACEMENT, TRANSFEMORAL  12/06/2017  . TRANSCATHETER AORTIC VALVE REPLACEMENT, TRANSFEMORAL N/A 12/06/2017   Procedure: TRANSCATHETER AORTIC VALVE REPLACEMENT, TRANSFEMORAL;  Surgeon: [name], MD;  Location: [facility];  Service: Open Heart Surgery;  Laterality: N/A;  . TRANSESOPHAGEAL ECHOCARDIOGRAM  12/06/2017    Current Medications: Outpatient Medications Prior to Visit  Medication Sig Dispense Refill  . aspirin EC 81 MG tablet Take 1 tablet (81 mg total) by mouth daily. 1 tablet 0  . atorvastatin (LIPITOR) 40 MG tablet Take 40 mg by mouth daily.    . Cholecalciferol (VITAMIN D-3) 5000 units TABS Take 5,000 Units by mouth daily.    . clopidogrel (PLAVIX) 75 MG tablet Take 1 tablet (75 mg total) by  mouth daily with breakfast. 90 tablet 1  . Cranberry 250 MG TABS Take 250 mg by mouth 2 (two) times daily as needed (bladder issues).     . cyclobenzaprine (FLEXERIL) 10 MG tablet Take 10 mg by mouth 3 (three) times daily as needed for muscle spasms.    . enalapril (VASOTEC) 10 MG tablet Take 10 mg by mouth daily.    Marland Kitchen glimepiride (AMARYL) 4 MG tablet Take 8 mg by mouth daily with breakfast.     . metFORMIN (GLUMETZA) 1000 MG (MOD) 24 hr tablet Take 1,000 mg by mouth 2 (two) times daily with a meal.     . Multiple Vitamin (MULTIVITAMIN WITH MINERALS) TABS tablet Take 1 tablet by mouth daily.    . traMADol (ULTRAM) 50 MG tablet Take 50 mg by mouth every 6 (six) hours as  needed for moderate pain.     . furosemide (LASIX) 80 MG tablet Take 1 tablet (80 mg total) by mouth 2 (two) times daily. 60 tablet   . potassium chloride SA (K-DUR,KLOR-CON) 20 MEQ tablet Take 2 tablets (40 mEq total) by mouth 2 (two) times daily. 120 tablet 6   No facility-administered medications prior to visit.      Allergies:   Sulfonamide derivatives; Castor oil; Amoxicillin; Demerol [meperidine]; and Hydrocodone   Social History   Socioeconomic History  . Marital status: Widowed    Spouse name: Not on file  . Number of children: Not on file  . Years of education: Not on file  . Highest education level: Not on file  Occupational History  . Not on file  Social Needs  . Financial resource strain: Not on file  . Food insecurity:    Worry: Not on file    Inability: Not on file  . Transportation needs:    Medical: Not on file    Non-medical: Not on file  Tobacco Use  . Smoking status: Former Smoker    Packs/day: 1.75    Years: 32.00    Pack years: 56.00    Types: Cigarettes    Last attempt to quit: 2002    Years since quitting: 17.8  . Smokeless tobacco: Never Used  Substance and Sexual Activity  . Alcohol use: No  . Drug use: Never  . Sexual activity: Not Currently  Lifestyle  . Physical activity:     Days per week: Not on file    Minutes per session: Not on file  . Stress: Not on file  Relationships  . Social connections:    Talks on phone: Not on file    Gets together: Not on file    Attends religious service: Not on file    Active member of club or organization: Not on file    Attends meetings of clubs or organizations: Not on file    Relationship status: Not on file  Other Topics Concern  . Not on file  Social History Narrative  . Not on file     Family History:  The patient's family history includes Diabetes in her maternal grandmother; Heart disease in her maternal grandfather; Lung cancer in her mother.      ROS:   Please see the history of present illness.    ROS All other systems reviewed and are negative.   PHYSICAL EXAM:   VS:  BP 120/63   Pulse 84   Ht 5' 1.5" (1.562 m)   Wt 299 lb (135.6 kg)   BMI 55.58 kg/m    GEN: Well nourished, well developed, in no acute distress, morbidly obese HEENT: normal Neck: no JVD or masses Cardiac: RRR; 2/6 SEM. No rubs, or gallops. 1+ LE edema with chronic venous stasis changes  Respiratory:  clear to auscultation bilaterally, normal work of breathing GI: soft, nontender, nondistended, + BS MS: no deformity or atrophy Skin: warm and dry, no rash. Groin sites healing well.  Neuro:  Alert and Oriented x 3, Strength and sensation are intact Psych: euthymic mood, full affect    Wt Readings from Last 3 Encounters:  12/14/17 299 lb (135.6 kg)  12/08/17 (!) 307 lb 1.6 oz (139.3 kg)  12/02/17 (!) 305 lb 12.8 oz (138.7 kg)      Studies/Labs Reviewed:   EKG:  EKG is ordered today.  The ekg ordered today demonstrates NSR, HR 84  Recent Labs: 12/02/2017: ALT 15;  B Natriuretic Peptide 111.8 12/07/2017: Magnesium 2.4 12/08/2017: BUN 25; Creatinine, Ser 1.11; Hemoglobin 10.5; Platelets 136; Potassium 4.7; Sodium 139   Lipid Panel No results found for: CHOL, TRIG, HDL, CHOLHDL, VLDL, LDLCALC, LDLDIRECT  Additional  studies/ records that were reviewed today include:  TAVR OPERATIVE NOTE   Date of Procedure:12/06/2017  Preoperative Diagnosis:Severe Aortic Stenosis   Procedure:   Transcatheter Aortic Valve Replacement - PercutaneousRightTransfemoral Approach Medtronic CoreValve Evolut Pro (size 68mm, serial # [id number])  Co-Surgeons:Michael Burt Knack, MD andClarence H. Roxy Manns, MD   Pre-operative Echo Findings: ? Severe aortic stenosis ? Normalleft ventricular systolic function  Post-operative Echo Findings: ? Mildparavalvular leak ? Normalleft ventricular systolic function  _________________  Post operative echo 12/07/17 Study Conclusions - Left ventricle: The cavity size was normal. There was moderate concentric hypertrophy. Systolic function was normal. The estimated ejection fraction was in the range of 55% to 60%. Wall motion was normal; there were no regional wall motion abnormalities. Doppler parameters are consistent with abnormal left ventricular relaxation (grade 1 diastolic dysfunction). Doppler parameters are consistent with high ventricular filling pressure. - Aortic valve: A 21mm Medtronic CoreValve Evolut Pro TAVR bioprosthesis was present. Transvalvular velocity was within the normal range. There was no stenosis. There was no regurgitation. Peak velocity (S): 207 cm/s. Mean gradient (S): 7 mm Hg. Valve area (VTI): 1.9 cm^2. Valve area (Vmax): 1.55 cm^2. Valve area (Vmean): 1.85 cm^2. - Mitral valve: Transvalvular velocity was within the normal range. There was no evidence for stenosis. There was no regurgitation. - Right ventricle: The cavity size was normal. Wall thickness was normal. Systolic function was normal. - Tricuspid valve: There was trivial regurgitation. - Pulmonary arteries: Systolic pressure was within the normal range. PA peak pressure: 17 mm Hg  (S).   ASSESSMENT & PLAN:   Severe AS s/p TAVR:doing well. Can tell a big difference in her breathing. Trying to walk more. Loosing weight. Groins sites look excellent. Continue ASA and plavix. SBE prophylaxis discussed. She has a listed allergy to amoxicillin as it caused diarrhea. We discussed how all antibiotics can do this, including the alternative (clindamycin). Plan to call in amoxicillin for all dental work. I will see her back in 1 month for follow up and echo.   HTN: BP well controlled today   Chronic diastolic CHF: appears euvolemic on lasix 80 mg BID. Thinks she may even be able to go down to lasix 80 mg daily.  Plan to decrease this down to 80mg  daily and 40 mEq Kdur daily.    Morbid obesity: Body mass index is 55.58 kg/m. Weight is dropping. She is trying to be more active now that she feels better after TAVR.   Medication Adjustments/Labs and Tests Ordered: Current medicines are reviewed at length with the patient today.  Concerns regarding medicines are outlined above.  Medication changes, Labs and Tests ordered today are listed in the Patient Instructions below. Patient Instructions  Medication Instructions: 1) DECREASE LASIX to 80 mg daily 2) DECREASE KDUR to 40 meq daily 3) Katie discussed the importance of taking an antibiotic prior to any dental visits to prevent damage to the heart valves from infection. You were given a prescription for an antibiotic based on current SBE prophylaxis guidelines. You have been called in AMOXIL 2,000mg  to take 1 hour prior to dental visits.  Labwork: You will have labs drawn next time you are in the office.  Testing/Procedures: No new orders.  Follow-Up: Please keep your appointments for your echocardiogram and office visit on  01/04/2018. Please arrive to our office at [place] by 12:30PM.    Signed, Angelena Form, PA-C  12/15/2017 9:39 AM    [address], [place], [place]   [address] Phone: [phone]; Fax: [phone]

## 2017-12-14 ENCOUNTER — Encounter: Payer: Self-pay | Admitting: Physician Assistant

## 2017-12-14 ENCOUNTER — Ambulatory Visit (INDEPENDENT_AMBULATORY_CARE_PROVIDER_SITE_OTHER): Payer: Medicare Other | Admitting: Physician Assistant

## 2017-12-14 ENCOUNTER — Ambulatory Visit: Payer: Medicare Other | Admitting: Physician Assistant

## 2017-12-14 VITALS — BP 120/63 | HR 84 | Ht 61.5 in | Wt 299.0 lb

## 2017-12-14 DIAGNOSIS — Z952 Presence of prosthetic heart valve: Secondary | ICD-10-CM

## 2017-12-14 DIAGNOSIS — I10 Essential (primary) hypertension: Secondary | ICD-10-CM | POA: Diagnosis not present

## 2017-12-14 DIAGNOSIS — I5032 Chronic diastolic (congestive) heart failure: Secondary | ICD-10-CM | POA: Diagnosis not present

## 2017-12-14 MED ORDER — FUROSEMIDE 80 MG PO TABS: 80.0000 mg | ORAL_TABLET | Freq: Every day | ORAL | 3 refills | Status: DC

## 2017-12-14 MED ORDER — POTASSIUM CHLORIDE CRYS ER 20 MEQ PO TBCR: 40.0000 meq | EXTENDED_RELEASE_TABLET | Freq: Every day | ORAL | 3 refills | Status: DC

## 2017-12-14 MED ORDER — AMOXICILLIN 500 MG PO TABS: ORAL_TABLET | ORAL | 6 refills | Status: DC

## 2017-12-14 NOTE — Patient Instructions (Signed)
Medication Instructions: 1) DECREASE LASIX to 80 mg daily 2) DECREASE KDUR to 40 meq daily 3) [name] discussed the importance of taking an antibiotic prior to any dental visits to prevent damage to the heart valves from infection. You were given a prescription for an antibiotic based on current SBE prophylaxis guidelines. You have been called in AMOXIL 2,000mg  to take 1 hour prior to dental visits.  Labwork: You will have labs drawn next time you are in the office.  Testing/Procedures: No new orders.  Follow-Up: Please keep your appointments for your echocardiogram and office visit on 01/04/2018. Please arrive to our office at [facility] by 12:30PM.

## 2017-12-15 ENCOUNTER — Ambulatory Visit: Payer: Medicare Other | Admitting: Physician Assistant

## 2017-12-16 ENCOUNTER — Telehealth: Payer: Self-pay | Admitting: Physician Assistant

## 2017-12-16 ENCOUNTER — Encounter: Payer: Self-pay | Admitting: Thoracic Surgery (Cardiothoracic Vascular Surgery)

## 2017-12-16 NOTE — Telephone Encounter (Signed)
Thanks Pam. Just let her know we are aware and we recommend that she get this treated though Dr. Arelia Sneddon which is sounds like she has.

## 2017-12-16 NOTE — Telephone Encounter (Signed)
Called patient back. Informed her that Cheryl Range PA is aware and recommends that she gets treated through Dr. [name]. Patient verbalized understanding and is waiting for a call from Dr. [name].

## 2017-12-16 NOTE — Telephone Encounter (Signed)
  Pt had heart valve replacement on 12/03/17. Pt wanted to let the Dr know that she has a kidney infection.

## 2017-12-16 NOTE — Telephone Encounter (Signed)
Called patient back. Patient stated she thinks she had a kidney infection, and she had a recent kidney infection that Dr. Arelia Sneddon treated. Encouraged patient to call Dr. Arelia Sneddon, PCP about her kidney issue. Patient verbalized understanding and will call her PCP. Will forward to [employer] PA so she is aware.

## 2018-01-03 NOTE — Progress Notes (Signed)
[facility]                                       Cardiology Office Note    Date:  01/04/2018   ID:  [name], DOB Nov 21, 1940, MRN [id number]  PCP:  Leonard Downing, MD  Cardiologist:  Dr. Wynonia Lawman / Dr. Burt Knack & Dr. Roxy Manns (TAVR)  CC: 1 month s/p TAVR  History of Present Illness:  [name] is a 77 y.o. female with a history of HTN, HLD, morbid obesity, fibromyalgia, NAFLD, DMT2, chronic diastolic CHFand severe AS s/p TAVR 12/06/17 who presents to clinic for follow up.   Patient was noted to have a heart murmur on physical exam several years ago by her primary care physician. She was diagnosed with aortic stenosis and referred to Dr. Wynonia Lawman who has been following her carefully ever since. Echocardiograms have demonstrated a gradual progression in the severity of aortic stenosis. Over the past year the patient has developed worsening exertional shortness of breath and generalized weakness. She had become progressively less mobile and had 2 frank syncopal episodes. She was seen in follow-up by Dr. Wynonia Lawman and repeat echo revealed normal left ventricular systolic function with severe aortic stenosis. R/LHC showed confirmed the presence of severe aortic stenosis and mild nonobstructive coronary artery disease.   She was evaluated by the multidisciplinary valve team and underwent successful TAVR with a76mm Medtronic CoreValve Evolut Provia the TF approach on 12/06/17. Post operative echo showed EF 55%, normally functioning TAVR valve with no PVL and mean gradient of 7 mmHg.  Initial ECG showed new LBBB, but this later resolved. She was discharged on aspirin and plavix and home lasix was cut back to 80mg  BID.  She has done well in follow up with a marked improvement in her symptoms. Lasix cut back to 80mg  daily. Today she presents to clinic for follow up.No CP. She continues to have SOB with mild-moderate exertion, but its has  improved greatly since having her surgery. She has chronic mild LE edema which is unchanged. No orthopnea or PND. She has occasional dizziness but no more syncope. No blood in stool or urine. No palpitations. Dr. Arelia Sneddon has been adjusting her diabetes meds. She has had some hypoglycemia recently.   Past Medical History:  Diagnosis Date  . Abnormal thyroid biopsy 2018   results were negative.  . Anemia    "when I was alot younger" (12/06/2017)  . Anxiety    self reported  . Arthritis    "almost all over; used to cry w/it when I was in my teens" (12/06/2017)  . CHF (congestive heart failure) ([place])   . Chronic back pain    "all over" (12/06/2017)  . Depression    "lost my son last year to cancer; I tended to him; he lived w/me" (12/06/2017)  . Diverticulosis of colon (without mention of hemorrhage)   . External hemorrhoids without mention of complication   . Fatty liver disease, nonalcoholic   . Fibromyalgia   . History of kidney stones   . History of stomach ulcers 1970  . Hyperlipemia   . Hypertension   . IBS (irritable bowel syndrome)   . Morbid obesity ([name])   . Psoriasis   . 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 Severe aortic stenosis   . Sinus headache   . Sleep apnea    "was told I do; never have had any problems w/it" (12/06/2017)  . Type II diabetes mellitus ([place])     Past Surgical History:  Procedure Laterality Date  . ABDOMINAL ADHESION SURGERY     "took 2 out"  . ABDOMINAL HYSTERECTOMY    . APPENDECTOMY    . BACK SURGERY    . BIOPSY THYROID  2018   results were negative.  Marland Kitchen BREAST LUMPECTOMY Left X 1   "benign"  . CATARACT EXTRACTION W/ INTRAOCULAR LENS  IMPLANT, BILATERAL Bilateral   . CATARACT EXTRACTION W/ INTRAOCULAR LENS IMPLANT Bilateral 2017  . EYE SURGERY    . HEMORRHOID BANDING    . LAPAROSCOPIC CHOLECYSTECTOMY      . LEFT AND RIGHT HEART CATHETERIZATION WITH CORONARY ANGIOGRAM N/A 11/15/2013   Procedure: LEFT AND RIGHT HEART CATHETERIZATION WITH CORONARY ANGIOGRAM;  Surgeon: Blane Ohara, MD;  Location: [facility] CATH LAB;  Service: Cardiovascular;  Laterality: N/A;  . LUMBAR [place]    . RETINAL DETACHMENT SURGERY Right   . RIGHT/LEFT HEART CATH AND CORONARY ANGIOGRAPHY N/A 11/02/2017   Procedure: RIGHT/LEFT HEART CATH AND CORONARY ANGIOGRAPHY;  Surgeon: Burnell Blanks, MD;  Location: [facility] CV LAB;  Service: Cardiovascular;  Laterality: N/A;  . TEE WITHOUT CARDIOVERSION N/A 12/06/2017   Procedure: TRANSESOPHAGEAL ECHOCARDIOGRAM (TEE);  Surgeon: [name], MD;  Location: [facility];  Service: Open Heart Surgery;  Laterality: N/A;  . TONSILLECTOMY    . TRANSCATHETER AORTIC VALVE REPLACEMENT, TRANSFEMORAL  12/06/2017  . TRANSCATHETER AORTIC VALVE REPLACEMENT, TRANSFEMORAL N/A 12/06/2017   Procedure: TRANSCATHETER AORTIC VALVE REPLACEMENT, TRANSFEMORAL;  Surgeon: [name], MD;  Location: [facility];  Service: Open Heart Surgery;  Laterality: N/A;  . TRANSESOPHAGEAL ECHOCARDIOGRAM  12/06/2017    Current Medications: Outpatient Medications Prior to Visit  Medication Sig Dispense Refill  . amoxicillin (AMOXIL) 500 MG tablet Take 2,000 mg (4 tablets) one hour prior to dental visits. 8 tablet 6  . aspirin EC 81 MG tablet Take 1 tablet (81 mg total) by mouth daily. 1 tablet 0  . atorvastatin (LIPITOR) 40 MG tablet Take 40 mg by mouth daily.    . Cholecalciferol (VITAMIN D-3) 5000 units TABS Take 5,000 Units by mouth daily.    . clopidogrel (PLAVIX) 75 MG tablet Take 1 tablet (75 mg total) by mouth daily with breakfast. 90 tablet 1  . Cranberry 250 MG TABS Take 250 mg by mouth 2 (two) times daily as needed (bladder issues).     . cyclobenzaprine (FLEXERIL) 10 MG tablet Take 10 mg by mouth 3 (three) times daily as needed for muscle spasms.    . enalapril (VASOTEC) 10 MG tablet Take 10 mg by  mouth daily.    . furosemide (LASIX) 80 MG tablet Take 1 tablet (80 mg total) by mouth daily. 90 tablet 3  . glimepiride (AMARYL) 4 MG tablet Take 4 mg by mouth daily with breakfast.    . metFORMIN (GLUMETZA) 1000 MG (MOD) 24 hr tablet Take 1,000 mg by mouth 2 (two) times daily with a meal.     . Multiple Vitamin (MULTIVITAMIN WITH MINERALS) TABS tablet Take 1 tablet by mouth daily.    . potassium chloride SA (K-DUR,KLOR-CON) 20 MEQ tablet Take 2 tablets (40 mEq total) by mouth daily. 180 tablet 3  . traMADol (ULTRAM) 50 MG tablet Take 50 mg by mouth every 6 (six)  hours as needed for moderate pain.     Marland Kitchen glimepiride (AMARYL) 4 MG tablet Take 8 mg by mouth daily with breakfast.      No facility-administered medications prior to visit.      Allergies:   Sulfonamide derivatives; Castor oil; Amoxicillin; Demerol [meperidine]; and Hydrocodone   Social History   Socioeconomic History  . Marital status: Widowed    Spouse name: Not on file  . Number of children: Not on file  . Years of education: Not on file  . Highest education level: Not on file  Occupational History  . Not on file  Social Needs  . Financial resource strain: Not on file  . Food insecurity:    Worry: Not on file    Inability: Not on file  . Transportation needs:    Medical: Not on file    Non-medical: Not on file  Tobacco Use  . Smoking status: Former Smoker    Packs/day: 1.75    Years: 32.00    Pack years: 56.00    Types: Cigarettes    Last attempt to quit: 2002    Years since quitting: 17.8  . Smokeless tobacco: Never Used  Substance and Sexual Activity  . Alcohol use: No  . Drug use: Never  . Sexual activity: Not Currently  Lifestyle  . Physical activity:    Days per week: Not on file    Minutes per session: Not on file  . Stress: Not on file  Relationships  . Social connections:    Talks on phone: Not on file    Gets together: Not on file    Attends religious service: Not on file    Active member  of club or organization: Not on file    Attends meetings of clubs or organizations: Not on file    Relationship status: Not on file  Other Topics Concern  . Not on file  Social History Narrative  . Not on file     Family History:  The patient's family history includes Diabetes in her maternal grandmother; Heart disease in her maternal grandfather; Lung cancer in her mother.      ROS:   Please see the history of present illness.    ROS All other systems reviewed and are negative.   PHYSICAL EXAM:   VS:  BP (!) 108/52   Pulse 81   Ht 5' 1.5" (1.562 m)   Wt 297 lb 9.6 oz (135 kg)   SpO2 99%   BMI 55.32 kg/m    GEN: Well nourished, well developed, in no acute distress. Morbidly obese HEENT: normal Neck: no JVD or masses Cardiac: RRR; 2/6 SEM. No rubs, or gallops, 1+ bilateral LE edema  Respiratory:  clear to auscultation bilaterally, normal work of breathing GI: soft, nontender, nondistended, + BS MS: no deformity or atrophy Skin: warm and dry, no rash Neuro:  Alert and Oriented x 3, Strength and sensation are intact Psych: euthymic mood, full affect   Wt Readings from Last 3 Encounters:  01/04/18 297 lb 9.6 oz (135 kg)  12/14/17 299 lb (135.6 kg)  12/08/17 (!) 307 lb 1.6 oz (139.3 kg)      Studies/Labs Reviewed:   EKG:  EKG is NOT ordered today.    Recent Labs: 12/02/2017: ALT 15; B Natriuretic Peptide 111.8 12/07/2017: Magnesium 2.4 12/08/2017: BUN 25; Creatinine, Ser 1.11; Hemoglobin 10.5; Platelets 136; Potassium 4.7; Sodium 139   Lipid Panel No results found for: CHOL, TRIG, HDL, CHOLHDL, VLDL, LDLCALC, LDLDIRECT  Additional studies/ records that were reviewed today include:  TAVR OPERATIVE NOTE   Date of Procedure:12/06/2017  Preoperative Diagnosis:Severe Aortic Stenosis   Procedure:   Transcatheter Aortic Valve Replacement - PercutaneousRightTransfemoral Approach Medtronic CoreValve Evolut Pro (size  45mm, serial # [id number])  Co-Surgeons:Michael Burt Knack, MD andClarence H. Roxy Manns, MD   Pre-operative Echo Findings: ? Severe aortic stenosis ? Normalleft ventricular systolic function  Post-operative Echo Findings: ? Mildparavalvular leak ? Normalleft ventricular systolic function  _________________  Post operative echo 12/07/17 Study Conclusions - Left ventricle: The cavity size was normal. There was moderate concentric hypertrophy. Systolic function was normal. The estimated ejection fraction was in the range of 55% to 60%. Wall motion was normal; there were no regional wall motion abnormalities. Doppler parameters are consistent with abnormal left ventricular relaxation (grade 1 diastolic dysfunction). Doppler parameters are consistent with high ventricular filling pressure. - Aortic valve: A 50mm Medtronic CoreValve Evolut Pro TAVR bioprosthesis was present. Transvalvular velocity was within the normal range. There was no stenosis. There was no regurgitation. Peak velocity (S): 207 cm/s. Mean gradient (S): 7 mm Hg. Valve area (VTI): 1.9 cm^2. Valve area (Vmax): 1.55 cm^2. Valve area (Vmean): 1.85 cm^2. - Mitral valve: Transvalvular velocity was within the normal range. There was no evidence for stenosis. There was no regurgitation. - Right ventricle: The cavity size was normal. Wall thickness was normal. Systolic function was normal. - Tricuspid valve: There was trivial regurgitation. - Pulmonary arteries: Systolic pressure was within the normal range. PA peak pressure: 17 mm Hg (S).  ______________  Echo 01/04/18 Study Conclusions  - Left ventricle: The cavity size was normal. There was moderate   concentric hypertrophy. Systolic function was normal. The   estimated ejection fraction was in the range of 55% to 60%. Wall   motion was normal; there were no regional wall motion   abnormalities. Doppler  parameters are consistent with abnormal   left ventricular relaxation (grade 1 diastolic dysfunction). - Aortic valve: A prosthesis was present and functioning normally.   The prosthesis had a normal range of motion. The sewing ring   appeared normal, had no rocking motion, and showed no evidence of   dehiscence. There was trivial perivalvular regurgitation. Mean   gradient (S): 9 mm Hg. - Mitral valve: Calcified annulus. There was trivial regurgitation. - Left atrium: The atrium was moderately dilated. - Right ventricle: Systolic function was normal. - Atrial septum: A patent foramen ovale cannot be excluded. - Tricuspid valve: There was mild regurgitation. Peak RV-RA   gradient (S): 32 mm Hg. Impressions: - S/P TAVR. Trivial perivalvular regurgitation. Mean gradient 9   mmHg. Prosthesis well seated.   ASSESSMENT & PLAN:   Severe AS s/p TAVR: echo today shows EF 55-60%, normally functioning TAVR with mean gradient 9 mmHg and trivial PVL. She has NYHA class II symptoms, mostly related to deconditioning and morbid obesity. SBE prophylaxis discussed; she has amoxicillin. Plavix can be discontinued after 6 months of therapy (05/2018).   HTN: BP well controlled today. Continue current regimen  Chronic diastolic CHF: appears euvolemic. Continue lasix 80mg  daily.    Morbid obesity: Body mass index is 55.32 kg/m. Working hard on diet and exercise.   Dispo: I will see her back in 3-4 months in Dr. Thurman Coyer absence .  Medication Adjustments/Labs and Tests Ordered: Current medicines are reviewed at length with the patient today.  Concerns regarding medicines are outlined above.  Medication changes, Labs and Tests ordered today are listed in the Patient Instructions below.  Patient Instructions  Medication Instructions:  1) STOP PLAVIX April 15  Labwork: None  Testing/Procedures: None  Follow-Up: You have an appointment with Nell Range, PA on 04/12/18 at 1:30PM.  Any Other  Special Instructions Will Be Listed Below (If Applicable).     If you need a refill on your cardiac medications before your next appointment, please call your pharmacy.      Signed, Angelena Form, PA-C  01/04/2018 6:02 PM    [facility] [address], [place], [place]  [address] Phone: [phone]; Fax: [phone]

## 2018-01-04 ENCOUNTER — Encounter: Payer: Self-pay | Admitting: Physician Assistant

## 2018-01-04 ENCOUNTER — Ambulatory Visit (HOSPITAL_COMMUNITY): Payer: Medicare Other | Attending: Cardiology

## 2018-01-04 ENCOUNTER — Other Ambulatory Visit: Payer: Self-pay

## 2018-01-04 ENCOUNTER — Ambulatory Visit (INDEPENDENT_AMBULATORY_CARE_PROVIDER_SITE_OTHER): Payer: Medicare Other | Admitting: Physician Assistant

## 2018-01-04 VITALS — BP 108/52 | HR 81 | Ht 61.5 in | Wt 297.6 lb

## 2018-01-04 DIAGNOSIS — Z952 Presence of prosthetic heart valve: Secondary | ICD-10-CM

## 2018-01-04 DIAGNOSIS — I5032 Chronic diastolic (congestive) heart failure: Secondary | ICD-10-CM

## 2018-01-04 DIAGNOSIS — I350 Nonrheumatic aortic (valve) stenosis: Secondary | ICD-10-CM | POA: Diagnosis present

## 2018-01-04 DIAGNOSIS — I10 Essential (primary) hypertension: Secondary | ICD-10-CM | POA: Diagnosis not present

## 2018-01-04 NOTE — Patient Instructions (Addendum)
Medication Instructions:  1) STOP PLAVIX April 15  Labwork: None  Testing/Procedures: None  Follow-Up: You have an appointment with Nell Range, PA on 04/12/18 at 1:30PM.  Any Other Special Instructions Will Be Listed Below (If Applicable).     If you need a refill on your cardiac medications before your next appointment, please call your pharmacy.

## 2018-01-26 ENCOUNTER — Encounter: Payer: Self-pay | Admitting: Thoracic Surgery (Cardiothoracic Vascular Surgery)

## 2018-03-23 ENCOUNTER — Encounter: Payer: Self-pay | Admitting: Physician Assistant

## 2018-04-11 NOTE — Progress Notes (Signed)
[facility]                                       Cardiology Office Note    Date:  04/12/2018   ID:  [name], DOB 12-26-1940, MRN [id number]  PCP:  Leonard Downing, MD  Cardiologist:  Dr. Burt Knack (previously [place])/ Dr. Burt Knack & Dr. Roxy Manns (TAVR)  CC: 3 month follow up   History of Present Illness:  [name] is a 78 y.o. female with a history of HTN, HLD, morbid obesity, fibromyalgia, NAFLD, DMT2, chronic diastolic CHFand severe AS s/p TAVR 12/06/17 who presents to clinic for follow up.   Patient was noted to have a heart murmur on physical exam several years ago by her primary care physician. She was diagnosed with aortic stenosis and referred to Dr. Wynonia Lawman who has been following her carefully ever since. Echocardiograms have demonstrated a gradual progression in the severity of aortic stenosis. Over the past year the patient has developed worsening exertional shortness of breath and generalized weakness. She had become progressively less mobile and had 2 frank syncopal episodes. She was seen in follow-up by Dr. Wynonia Lawman and repeat echo revealed normal left ventricular systolic function with severe aortic stenosis. R/LHC showed confirmed the presence of severe aortic stenosis and mild nonobstructive coronary artery disease.   She was evaluated by the multidisciplinary valve team and underwent successful TAVR with a58mm Medtronic CoreValve Evolut Provia the TF approach on 12/06/17. Post operative echo showed EF 55%, normally functioning TAVR valve with no PVL and mean gradient of 7 mmHg.  Initial ECG showed new LBBB, but this later resolved. She was discharged on aspirin and plavix. She has done well in follow up with a marked improvement in her symptoms. Lasix cut back to 80mg  daily. 1 month echo showed EF 55-60%, normally functioning TAVR with mean gradient 9 mmHg and trivial PVL.  Today she presents to clinic for  follow up. She occasionally gets sporadic CP that come and goes in seconds. She has some mild DOE that is markedly improved since her TAVR. Chronic mild LE edema that is unchanged. This has also improved since TAVR when she used to get blisters from swelling. No orthopnea or PND. No dizziness or syncope. No blood in stool or urine. No palpitations. Continues to have issues with hypoglycemia that she thinks is related to the plavix. Her brother recently had a stroke but he is recovering.     Past Medical History:  Diagnosis Date  . Abnormal thyroid biopsy 2018   results were negative.  . Anemia    "when I was alot younger" (12/06/2017)  . Anxiety    self reported  . Arthritis    "almost all over; used to cry w/it when I was in my teens" (12/06/2017)  . CHF (congestive heart failure) ([place])   . Chronic back pain    "all over" (12/06/2017)  . Depression    "lost my son last year to cancer; I tended to him; he lived w/me" (12/06/2017)  . Diverticulosis of colon (without mention of hemorrhage)   . External hemorrhoids without mention of complication   . Fatty liver disease, nonalcoholic   . Fibromyalgia   . History of kidney stones   . History of stomach ulcers 1970  . Hyperlipemia   . Hypertension   . IBS (irritable  bowel syndrome)   . Morbid obesity ([place])   . Psoriasis   . 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 Severe aortic stenosis   . Sinus headache   . Sleep apnea    "was told I do; never have had any problems w/it" (12/06/2017)  . Type II diabetes mellitus ([place])     Past Surgical History:  Procedure Laterality Date  . ABDOMINAL ADHESION SURGERY     "took 2 out"  . ABDOMINAL HYSTERECTOMY    . APPENDECTOMY    . BACK SURGERY    . BIOPSY THYROID  2018   results were negative.  Marland Kitchen BREAST LUMPECTOMY Left X 1   "benign"  . CATARACT EXTRACTION  W/ INTRAOCULAR LENS  IMPLANT, BILATERAL Bilateral   . CATARACT EXTRACTION W/ INTRAOCULAR LENS IMPLANT Bilateral 2017  . EYE SURGERY    . HEMORRHOID BANDING    . LAPAROSCOPIC CHOLECYSTECTOMY    . LEFT AND RIGHT HEART CATHETERIZATION WITH CORONARY ANGIOGRAM N/A 11/15/2013   Procedure: LEFT AND RIGHT HEART CATHETERIZATION WITH CORONARY ANGIOGRAM;  Surgeon: Blane Ohara, MD;  Location: [facility] CATH LAB;  Service: Cardiovascular;  Laterality: N/A;  . LUMBAR [place]    . RETINAL DETACHMENT SURGERY Right   . RIGHT/LEFT HEART CATH AND CORONARY ANGIOGRAPHY N/A 11/02/2017   Procedure: RIGHT/LEFT HEART CATH AND CORONARY ANGIOGRAPHY;  Surgeon: Burnell Blanks, MD;  Location: [facility] CV LAB;  Service: Cardiovascular;  Laterality: N/A;  . TEE WITHOUT CARDIOVERSION N/A 12/06/2017   Procedure: TRANSESOPHAGEAL ECHOCARDIOGRAM (TEE);  Surgeon: [name], MD;  Location: [facility];  Service: Open Heart Surgery;  Laterality: N/A;  . TONSILLECTOMY    . TRANSCATHETER AORTIC VALVE REPLACEMENT, TRANSFEMORAL  12/06/2017  . TRANSCATHETER AORTIC VALVE REPLACEMENT, TRANSFEMORAL N/A 12/06/2017   Procedure: TRANSCATHETER AORTIC VALVE REPLACEMENT, TRANSFEMORAL;  Surgeon: [name], MD;  Location: [name];  Service: Open Heart Surgery;  Laterality: N/A;  . TRANSESOPHAGEAL ECHOCARDIOGRAM  12/06/2017    Current Medications: Outpatient Medications Prior to Visit  Medication Sig Dispense Refill  . amoxicillin (AMOXIL) 500 MG tablet Take 2,000 mg (4 tablets) one hour prior to dental visits. 8 tablet 6  . aspirin EC 81 MG tablet Take 1 tablet (81 mg total) by mouth daily. 1 tablet 0  . atorvastatin (LIPITOR) 40 MG tablet Take 40 mg by mouth daily.    . Cholecalciferol (VITAMIN D-3) 5000 units TABS Take 5,000 Units by mouth daily.    . clopidogrel (PLAVIX) 75 MG tablet Take 1 tablet (75 mg total) by mouth daily with breakfast. 90 tablet 1  . Cranberry 250 MG TABS Take 250 mg by mouth 2 (two) times daily as  needed (bladder issues).     . cyclobenzaprine (FLEXERIL) 10 MG tablet Take 10 mg by mouth 3 (three) times daily as needed for muscle spasms.    . enalapril (VASOTEC) 10 MG tablet Take 10 mg by mouth daily.    . furosemide (LASIX) 80 MG tablet Take 1 tablet (80 mg total) by mouth daily. 90 tablet 3  . glimepiride (AMARYL) 4 MG tablet Take 4 mg by mouth daily with breakfast.    . metFORMIN (GLUMETZA) 1000 MG (MOD) 24 hr tablet Take 1,000 mg by mouth 2 (two) times daily with a meal.     . Multiple Vitamin (MULTIVITAMIN WITH MINERALS) TABS tablet Take 1 tablet by mouth daily.    Marland Kitchen  potassium chloride SA (K-DUR,KLOR-CON) 20 MEQ tablet Take 2 tablets (40 mEq total) by mouth daily. 180 tablet 3  . traMADol (ULTRAM) 50 MG tablet Take 50 mg by mouth every 6 (six) hours as needed for moderate pain.      No facility-administered medications prior to visit.      Allergies:   Sulfonamide derivatives; Castor oil; Amoxicillin; Demerol [meperidine]; and Hydrocodone   Social History   Socioeconomic History  . Marital status: Widowed    Spouse name: Not on file  . Number of children: Not on file  . Years of education: Not on file  . Highest education level: Not on file  Occupational History  . Not on file  Social Needs  . Financial resource strain: Not on file  . Food insecurity:    Worry: Not on file    Inability: Not on file  . Transportation needs:    Medical: Not on file    Non-medical: Not on file  Tobacco Use  . Smoking status: Former Smoker    Packs/day: 1.75    Years: 32.00    Pack years: 56.00    Types: Cigarettes    Last attempt to quit: 2002    Years since quitting: 18.1  . Smokeless tobacco: Never Used  Substance and Sexual Activity  . Alcohol use: No  . Drug use: Never  . Sexual activity: Not Currently  Lifestyle  . Physical activity:    Days per week: Not on file    Minutes per session: Not on file  . Stress: Not on file  Relationships  . Social connections:    Talks  on phone: Not on file    Gets together: Not on file    Attends religious service: Not on file    Active member of club or organization: Not on file    Attends meetings of clubs or organizations: Not on file    Relationship status: Not on file  Other Topics Concern  . Not on file  Social History Narrative  . Not on file     Family History:  The patient's family history includes Diabetes in her maternal grandmother; Heart disease in her maternal grandfather; Lung cancer in her mother.      ROS:   Please see the history of present illness.    ROS All other systems reviewed and are negative.   PHYSICAL EXAM:   VS:  BP (!) 144/64   Pulse 84   Ht 5' 1.5" (1.562 m)   Wt (!) 304 lb 12.8 oz (138.3 kg)   SpO2 97%   BMI 56.66 kg/m    GEN: Well nourished, well developed, in no acute distress. Morbidly obese HEENT: normal Neck: no JVD or masses Cardiac: RRR; 2/6 SEM. No rubs, or gallops, 1+ bilateral LE edema  Respiratory:  clear to auscultation bilaterally, normal work of breathing GI: soft, nontender, nondistended, + BS MS: no deformity or atrophy Skin: warm and dry, no rash Neuro:  Alert and Oriented x 3, Strength and sensation are intact Psych: euthymic mood, full affect   Wt Readings from Last 3 Encounters:  04/12/18 (!) 304 lb 12.8 oz (138.3 kg)  01/04/18 297 lb 9.6 oz (135 kg)  12/14/17 299 lb (135.6 kg)      Studies/Labs Reviewed:   EKG:  EKG is NOT ordered today.    Recent Labs: 12/02/2017: ALT 15; B Natriuretic Peptide 111.8 12/07/2017: Magnesium 2.4 12/08/2017: BUN 25; Creatinine, Ser 1.11; Hemoglobin 10.5; Platelets 136; Potassium 4.7;  Sodium 139   Lipid Panel No results found for: CHOL, TRIG, HDL, CHOLHDL, VLDL, LDLCALC, LDLDIRECT  Additional studies/ records that were reviewed today include:  TAVR OPERATIVE NOTE   Date of Procedure:12/06/2017  Preoperative Diagnosis:Severe Aortic Stenosis    Procedure:   Transcatheter Aortic Valve Replacement - PercutaneousRightTransfemoral Approach Medtronic CoreValve Evolut Pro (size 25mm, serial # [id number])  Co-Surgeons:Michael Burt Knack, MD andClarence H. Roxy Manns, MD   Pre-operative Echo Findings: ? Severe aortic stenosis ? Normalleft ventricular systolic function  Post-operative Echo Findings: ? Mildparavalvular leak ? Normalleft ventricular systolic function  _________________  Post operative echo 12/07/17 Study Conclusions - Left ventricle: The cavity size was normal. There was moderate concentric hypertrophy. Systolic function was normal. The estimated ejection fraction was in the range of 55% to 60%. Wall motion was normal; there were no regional wall motion abnormalities. Doppler parameters are consistent with abnormal left ventricular relaxation (grade 1 diastolic dysfunction). Doppler parameters are consistent with high ventricular filling pressure. - Aortic valve: A 16mm Medtronic CoreValve Evolut Pro TAVR bioprosthesis was present. Transvalvular velocity was within the normal range. There was no stenosis. There was no regurgitation. Peak velocity (S): 207 cm/s. Mean gradient (S): 7 mm Hg. Valve area (VTI): 1.9 cm^2. Valve area (Vmax): 1.55 cm^2. Valve area (Vmean): 1.85 cm^2. - Mitral valve: Transvalvular velocity was within the normal range. There was no evidence for stenosis. There was no regurgitation. - Right ventricle: The cavity size was normal. Wall thickness was normal. Systolic function was normal. - Tricuspid valve: There was trivial regurgitation. - Pulmonary arteries: Systolic pressure was within the normal range. PA peak pressure: 17 mm Hg (S).  ______________  Echo 01/04/18 Study Conclusions - Left ventricle: The cavity size was normal. There was moderate   concentric hypertrophy. Systolic function was normal. The    estimated ejection fraction was in the range of 55% to 60%. Wall   motion was normal; there were no regional wall motion   abnormalities. Doppler parameters are consistent with abnormal   left ventricular relaxation (grade 1 diastolic dysfunction). - Aortic valve: A prosthesis was present and functioning normally.   The prosthesis had a normal range of motion. The sewing ring   appeared normal, had no rocking motion, and showed no evidence of   dehiscence. There was trivial perivalvular regurgitation. Mean   gradient (S): 9 mm Hg. - Mitral valve: Calcified annulus. There was trivial regurgitation. - Left atrium: The atrium was moderately dilated. - Right ventricle: Systolic function was normal. - Atrial septum: A patent foramen ovale cannot be excluded. - Tricuspid valve: There was mild regurgitation. Peak RV-RA   gradient (S): 32 mm Hg. Impressions: - S/P TAVR. Trivial perivalvular regurgitation. Mean gradient 9   mmHg. Prosthesis well seated.   ASSESSMENT & PLAN:   Severe AS s/p TAVR: doing well. She has had a marked symptomatic improvement since her TAVR. She will stop plavix in mid April and continue on a baby aspirin. She has amoxicillin for SBE prophylaxis. I will see her back in October for 1 year appointment with an echo.   HTN: BP mildly elevated today but she has a history of white coat HTN. No med changes made.   Chronic diastolic CHF: appears euvoliemic. Has chronic stable LE edema. She takes lasix 80 mg daily with PRN extra 80mg / potassium as needed    Morbid obesity: Body mass index is 56.66 kg/m. She has been having issues with hypoglycemia that she thinks is related to her plavix. This causes her  to have to eat more. She is working with PCP to get these under control so she can focus on weight loss.    Medication Adjustments/Labs and Tests Ordered: Current medicines are reviewed at length with the patient today.  Concerns regarding medicines are outlined above.   Medication changes, Labs and Tests ordered today are listed in the Patient Instructions below. Patient Instructions  Medication Instructions:  Your physician recommends that you continue on your current medications as directed. Please refer to the Current Medication list given to you today. You can stop Clopidogrel in mid April--around April 15,2020 If you need a refill on your cardiac medications before your next appointment, please call your pharmacy.   Lab work: none If you have labs (blood work) drawn today and your tests are completely normal, you will receive your results only by: Marland Kitchen MyChart Message (if you have MyChart) OR . A paper copy in the mail If you have any lab test that is abnormal or we need to change your treatment, we will call you to review the results.  Testing/Procedures: Your physician has requested that you have an echocardiogram. Echocardiography is a painless test that uses sound waves to create images of your heart. It provides your doctor with information about the size and shape of your heart and how well your heart's chambers and valves are working. This procedure takes approximately one hour. There are no restrictions for this procedure. Scheduled for October 14,2020  Follow-Up: You have a follow up appointment on October 14,2020 with K. Grandville Silos, [place]     Signed, Angelena Form, PA-C  04/12/2018 2:51 PM    [facility] [place], [place], [place]  [address] Phone: [phone]; Fax: [phone]

## 2018-04-12 ENCOUNTER — Encounter: Payer: Self-pay | Admitting: Physician Assistant

## 2018-04-12 ENCOUNTER — Encounter (INDEPENDENT_AMBULATORY_CARE_PROVIDER_SITE_OTHER): Payer: Self-pay

## 2018-04-12 ENCOUNTER — Ambulatory Visit (INDEPENDENT_AMBULATORY_CARE_PROVIDER_SITE_OTHER): Payer: Medicare Other | Admitting: Physician Assistant

## 2018-04-12 VITALS — BP 144/64 | HR 84 | Ht 61.5 in | Wt 304.8 lb

## 2018-04-12 DIAGNOSIS — I5032 Chronic diastolic (congestive) heart failure: Secondary | ICD-10-CM

## 2018-04-12 DIAGNOSIS — Z952 Presence of prosthetic heart valve: Secondary | ICD-10-CM

## 2018-04-12 DIAGNOSIS — I10 Essential (primary) hypertension: Secondary | ICD-10-CM

## 2018-04-12 NOTE — Patient Instructions (Signed)
Medication Instructions:  Your physician recommends that you continue on your current medications as directed. Please refer to the Current Medication list given to you today. You can stop Clopidogrel in mid April--around April 15,2020 If you need a refill on your cardiac medications before your next appointment, please call your pharmacy.   Lab work: none If you have labs (blood work) drawn today and your tests are completely normal, you will receive your results only by: Marland Kitchen MyChart Message (if you have MyChart) OR . A paper copy in the mail If you have any lab test that is abnormal or we need to change your treatment, we will call you to review the results.  Testing/Procedures: Your physician has requested that you have an echocardiogram. Echocardiography is a painless test that uses sound waves to create images of your heart. It provides your doctor with information about the size and shape of your heart and how well your heart's chambers and valves are working. This procedure takes approximately one hour. There are no restrictions for this procedure. Scheduled for October 14,2020  Follow-Up: You have a follow up appointment on October 14,2020 with K. Grandville Silos, [place]

## 2018-10-12 ENCOUNTER — Other Ambulatory Visit: Payer: Self-pay | Admitting: Family Medicine

## 2018-10-12 DIAGNOSIS — E2839 Other primary ovarian failure: Secondary | ICD-10-CM

## 2018-12-05 NOTE — Progress Notes (Signed)
[place]                                       Cardiology Office Note    Date:  12/07/2018   ID:  [name], DOB [birth year], MRN [id number]  PCP:  Leonard Downing, MD  Cardiologist:  Dr. Burt Knack (previously [place])/ Dr. Burt Knack & Dr. Roxy Manns (TAVR)  CC: 1 year s/p TAVR  History of Present Illness:  [name] is a 78 y.o. female with a history of HTN, HLD, morbid obesity, fibromyalgia, NAFLD, DMT2, chronic diastolic CHFand severe AS s/p TAVR 12/06/17 who presents to clinic for follow up.   Patient was noted to have a heart murmur on physical exam several years ago by her primary care physician. She was diagnosed with aortic stenosis and referred to Dr. Wynonia Lawman who has been following her carefully ever since. Echocardiograms have demonstrated a gradual progression in the severity of aortic stenosis. Over the past year the patient has developed worsening exertional shortness of breath and generalized weakness. She had become progressively less mobile and had 2 frank syncopal episodes. She was seen in follow-up by Dr. Wynonia Lawman and repeat echo revealed normal left ventricular systolic function with severe aortic stenosis. R/LHC showed confirmed the presence of severe aortic stenosis and mild nonobstructive coronary artery disease.   She was evaluated by the multidisciplinary valve team and underwent successful TAVR with a14mm Medtronic CoreValve Evolut Provia the TF approach on 12/06/17. Post operative echo showed EF 55%, normally functioning TAVR valve with no PVL and mean gradient of 7 mmHg. Initial ECG showed new LBBB, but this later resolved. She was discharged on aspirin and plavix. She has done well in follow up with a marked improvement in her symptoms. Lasix cut back to 80mg  daily. 1 month echo showed EF 55-60%, normally functioning TAVR with mean gradient 9 mmHg and trivial PVL.  Today she presents to clinic for follow  up. Still having some LE swelling which is chronic. Occasionally has feeling chest pain that is fleeting. Not exertional. Still has some shortness of breath with exertion but it is much improved since TAVR. She went grocery shopping yesterday and "gave out". She just felt tired and had to take a nap. She had lost weight and down to 172 lbs over the summer but has gained some of that back. No orthopnea or PND. No dizziness or syncope. She has been seen by her PCP and gotten regular labwork.    Past Medical History:  Diagnosis Date  . Abnormal thyroid biopsy 2018   results were negative.  . Anemia    "when I was alot younger" (12/06/2017)  . Anxiety    self reported  . Arthritis    "almost all over; used to cry w/it when I was in my teens" (12/06/2017)  . CHF (congestive heart failure) ([place])   . Chronic back pain    "all over" (12/06/2017)  . Depression    "lost my son last year to cancer; I tended to him; he lived w/me" (12/06/2017)  . Diverticulosis of colon (without mention of hemorrhage)   . External hemorrhoids without mention of complication   . Fatty liver disease, nonalcoholic   . Fibromyalgia   . History of kidney stones   . History of stomach ulcers 1970  . Hyperlipemia   . Hypertension   .  IBS (irritable bowel syndrome)   . Morbid obesity ([place])   . Psoriasis   . 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 Severe aortic stenosis   . Sinus headache   . Sleep apnea    "was told I do; never have had any problems w/it" (12/06/2017)  . Type II diabetes mellitus ([place])     Past Surgical History:  Procedure Laterality Date  . ABDOMINAL ADHESION SURGERY     "took 2 out"  . ABDOMINAL HYSTERECTOMY    . APPENDECTOMY    . BACK SURGERY    . BIOPSY THYROID  2018   results were negative.  Marland Kitchen BREAST LUMPECTOMY Left X 1   "benign"  . CATARACT EXTRACTION  W/ INTRAOCULAR LENS  IMPLANT, BILATERAL Bilateral   . CATARACT EXTRACTION W/ INTRAOCULAR LENS IMPLANT Bilateral 2017  . EYE SURGERY    . HEMORRHOID BANDING    . LAPAROSCOPIC CHOLECYSTECTOMY    . LEFT AND RIGHT HEART CATHETERIZATION WITH CORONARY ANGIOGRAM N/A 11/15/2013   Procedure: LEFT AND RIGHT HEART CATHETERIZATION WITH CORONARY ANGIOGRAM;  Surgeon: Blane Ohara, MD;  Location: [facility] CATH LAB;  Service: Cardiovascular;  Laterality: N/A;  . LUMBAR [place]    . RETINAL DETACHMENT SURGERY Right   . RIGHT/LEFT HEART CATH AND CORONARY ANGIOGRAPHY N/A 11/02/2017   Procedure: RIGHT/LEFT HEART CATH AND CORONARY ANGIOGRAPHY;  Surgeon: Burnell Blanks, MD;  Location: [facility] CV LAB;  Service: Cardiovascular;  Laterality: N/A;  . TEE WITHOUT CARDIOVERSION N/A 12/06/2017   Procedure: TRANSESOPHAGEAL ECHOCARDIOGRAM (TEE);  Surgeon: [name], MD;  Location: [facility];  Service: Open Heart Surgery;  Laterality: N/A;  . TONSILLECTOMY    . TRANSCATHETER AORTIC VALVE REPLACEMENT, TRANSFEMORAL  12/06/2017  . TRANSCATHETER AORTIC VALVE REPLACEMENT, TRANSFEMORAL N/A 12/06/2017   Procedure: TRANSCATHETER AORTIC VALVE REPLACEMENT, TRANSFEMORAL;  Surgeon: [name], MD;  Location: [facility];  Service: Open Heart Surgery;  Laterality: N/A;  . TRANSESOPHAGEAL ECHOCARDIOGRAM  12/06/2017    Current Medications: Outpatient Medications Prior to Visit  Medication Sig Dispense Refill  . amoxicillin (AMOXIL) 500 MG tablet Take 2,000 mg (4 tablets) one hour prior to dental visits. 8 tablet 6  . aspirin EC 81 MG tablet Take 1 tablet (81 mg total) by mouth daily. 1 tablet 0  . atorvastatin (LIPITOR) 40 MG tablet Take 40 mg by mouth daily.    . Cholecalciferol (VITAMIN D-3) 5000 units TABS Take 5,000 Units by mouth daily.    . clopidogrel (PLAVIX) 75 MG tablet Take 1 tablet (75 mg total) by mouth daily with breakfast. 90 tablet 1  . Cranberry 250 MG TABS Take 250 mg by mouth 2 (two) times daily as  needed (bladder issues).     . cyclobenzaprine (FLEXERIL) 10 MG tablet Take 10 mg by mouth 3 (three) times daily as needed for muscle spasms.    . enalapril (VASOTEC) 10 MG tablet Take 10 mg by mouth daily.    . furosemide (LASIX) 80 MG tablet Take 1 tablet (80 mg total) by mouth daily. 90 tablet 3  . glimepiride (AMARYL) 4 MG tablet Take 4 mg by mouth 3 (three) times daily.     . metFORMIN (GLUMETZA) 1000 MG (MOD) 24 hr tablet Take 1,000 mg by mouth 2 (two) times daily with a meal.     . Multiple Vitamin (MULTIVITAMIN WITH MINERALS) TABS tablet Take 1 tablet by mouth daily.    Marland Kitchen  potassium chloride SA (K-DUR,KLOR-CON) 20 MEQ tablet Take 2 tablets (40 mEq total) by mouth daily. 180 tablet 3  . traMADol (ULTRAM) 50 MG tablet Take 50 mg by mouth every 6 (six) hours as needed for moderate pain.      No facility-administered medications prior to visit.      Allergies:   Sulfonamide derivatives, Castor oil, Amoxicillin, Demerol [meperidine], and Hydrocodone   Social History   Socioeconomic History  . Marital status: Widowed    Spouse name: Not on file  . Number of children: Not on file  . Years of education: Not on file  . Highest education level: Not on file  Occupational History  . Not on file  Social Needs  . Financial resource strain: Not on file  . Food insecurity    Worry: Not on file    Inability: Not on file  . Transportation needs    Medical: Not on file    Non-medical: Not on file  Tobacco Use  . Smoking status: Former Smoker    Packs/day: 1.75    Years: 32.00    Pack years: 56.00    Types: Cigarettes    Quit date: 2002    Years since quitting: 18.8  . Smokeless tobacco: Never Used  Substance and Sexual Activity  . Alcohol use: No  . Drug use: Never  . Sexual activity: Not Currently  Lifestyle  . Physical activity    Days per week: Not on file    Minutes per session: Not on file  . Stress: Not on file  Relationships  . Social Herbalist on phone: Not  on file    Gets together: Not on file    Attends religious service: Not on file    Active member of club or organization: Not on file    Attends meetings of clubs or organizations: Not on file    Relationship status: Not on file  Other Topics Concern  . Not on file  Social History Narrative  . Not on file     Family History:  The patient's family history includes Diabetes in her maternal grandmother; Heart disease in her maternal grandfather; Lung cancer in her mother.      ROS:   Please see the history of present illness.    ROS All other systems reviewed and are negative.   PHYSICAL EXAM:   VS:  BP 132/70   Pulse 92   Ht 5' 1.5" (1.562 m)   Wt 290 lb (131.5 kg)   SpO2 98%   BMI 53.91 kg/m    GEN: Well nourished, well developed, in no acute distress. Morbidly obese HEENT: normal Neck: no JVD or masses Cardiac: RRR; soft flow murmur. No rubs, or gallops, 1+ bilateral LE edema with chronic venous stasis changes. Respiratory:  clear to auscultation bilaterally, normal work of breathing GI: soft, nontender, nondistended, + BS MS: no deformity or atrophy Skin: warm and dry, no rash Neuro:  Alert and Oriented x 3, Strength and sensation are intact Psych: euthymic mood, full affect   Wt Readings from Last 3 Encounters:  12/06/18 290 lb (131.5 kg)  04/12/18 (!) 304 lb 12.8 oz (138.3 kg)  01/04/18 297 lb 9.6 oz (135 kg)      Studies/Labs Reviewed:   EKG:  EKG is ordered today.  NSR HR 93, low voltage, LAFB, septal Q waves.   Recent Labs: 12/08/2017: BUN 25; Creatinine, Ser 1.11; Hemoglobin 10.5; Platelets 136; Potassium 4.7; Sodium 139  Lipid Panel No results found for: CHOL, TRIG, HDL, CHOLHDL, VLDL, LDLCALC, LDLDIRECT  Additional studies/ records that were reviewed today include:  TAVR OPERATIVE NOTE   Date of Procedure:12/06/2017  Preoperative Diagnosis:Severe Aortic Stenosis   Procedure:   Transcatheter Aortic Valve  Replacement - PercutaneousRightTransfemoral Approach Medtronic CoreValve Evolut Pro (size 71mm, serial # [id number])  Co-Surgeons:Michael Burt Knack, MD andClarence H. Roxy Manns, MD   Pre-operative Echo Findings: ? Severe aortic stenosis ? Normalleft ventricular systolic function  Post-operative Echo Findings: ? Mildparavalvular leak ? Normalleft ventricular systolic function  ______________   Echo 01/04/18 Study Conclusions - Left ventricle: The cavity size was normal. There was moderate   concentric hypertrophy. Systolic function was normal. The   estimated ejection fraction was in the range of 55% to 60%. Wall   motion was normal; there were no regional wall motion   abnormalities. Doppler parameters are consistent with abnormal   left ventricular relaxation (grade 1 diastolic dysfunction). - Aortic valve: A prosthesis was present and functioning normally.   The prosthesis had a normal range of motion. The sewing ring   appeared normal, had no rocking motion, and showed no evidence of   dehiscence. There was trivial perivalvular regurgitation. Mean   gradient (S): 9 mm Hg. - Mitral valve: Calcified annulus. There was trivial regurgitation. - Left atrium: The atrium was moderately dilated. - Right ventricle: Systolic function was normal. - Atrial septum: A patent foramen ovale cannot be excluded. - Tricuspid valve: There was mild regurgitation. Peak RV-RA   gradient (S): 32 mm Hg. Impressions: - S/P TAVR. Trivial perivalvular regurgitation. Mean gradient 9   mmHg. Prosthesis well seated.  _________________   Echo 12/06/18 IMPRESSIONS  1. Left ventricular ejection fraction, by visual estimation, is 60 to 65%. The left ventricle has normal function. There is moderately increased left ventricular hypertrophy.  2. Elevated left ventricular end-diastolic pressure.  3. Left ventricular diastolic Doppler parameters are consistent with  impaired relaxation pattern of LV diastolic filling.  4. Global right ventricle has normal systolic function.The right ventricular size is normal. No increase in right ventricular wall thickness.  5. Left atrial size was normal.  6. Right atrial size was normal.  7. Mild mitral annular calcification.  8. The mitral valve is abnormal. Trace mitral valve regurgitation. Mild mitral stenosis.  9. The tricuspid valve is grossly normal. Tricuspid valve regurgitation is mild. 10. Aortic valve mean gradient measures 9.8 mmHg. 11. Aortic valve peak gradient measures 18.1 mmHg. 12. Aortic valve regurgitation is trivial by color flow Doppler. 13. The pulmonic valve was grossly normal. Pulmonic valve regurgitation is not visualized by color flow Doppler. 14. Mildly elevated pulmonary artery systolic pressure. 15. MV peak gradient, 11.4 mmHg.   ASSESSMENT & PLAN:   Severe AS s/p TAVR: echo today shows EF 60%, normally functioning TAVR with mean gradient of 9.8 mm Hg and trivial PVL. She has NYHA class II symptoms, but much improved since her TAVR. She understands the need for ongoing SBE prophylaxis. Continue on aspirin 81mg  daily alone.   HTN: BP well controlled. Continue current regiem  Chronic diastolic CHF: appear euvolemic except has chronic LE edema with chronic venous stasis changes.    Morbid obesity: Body mass index is 53.91 kg/m. Had lost weight but now gained some back.    Medication Adjustments/Labs and Tests Ordered: Current medicines are reviewed at length with the patient today.  Concerns regarding medicines are outlined above.  Medication changes, Labs and Tests ordered today are listed in the Patient  Instructions below. Patient Instructions  Medication Instructions:  Your provider recommends that you continue on your current medications as directed. Please refer to the Current Medication list given to you today.   If you need a refill on your cardiac medications before your next  appointment, please call your pharmacy.    Follow-Up: At [facility], you and your health needs are our priority.  As part of our continuing mission to provide you with exceptional heart care, we have created designated Provider Care Teams.  These Care Teams include your primary Cardiologist (physician) and Advanced Practice Providers (APPs -  Physician Assistants and Nurse Practitioners) who all work together to provide you with the care you need, when you need it. You will need a follow up appointment in:  12 months.  Please call our office 2 months in advance to schedule this appointment.  You may see Dr. Burt Knack or one of the following Advanced Practice Providers on your designated Care Team: Richardson Dopp, PA-C [place], [place] . Daune Perch, NP    Signed, Angelena Form, PA-C  12/07/2018 9:31 AM    [facility] [address], [place], [place]  [address] Phone: [phone]; Fax: [phone]

## 2018-12-06 ENCOUNTER — Other Ambulatory Visit (HOSPITAL_COMMUNITY): Payer: Medicare Other

## 2018-12-06 ENCOUNTER — Other Ambulatory Visit: Payer: Self-pay

## 2018-12-06 ENCOUNTER — Encounter (INDEPENDENT_AMBULATORY_CARE_PROVIDER_SITE_OTHER): Payer: Self-pay

## 2018-12-06 ENCOUNTER — Ambulatory Visit (INDEPENDENT_AMBULATORY_CARE_PROVIDER_SITE_OTHER): Payer: Medicare Other | Admitting: Physician Assistant

## 2018-12-06 ENCOUNTER — Ambulatory Visit (HOSPITAL_COMMUNITY): Payer: Medicare Other | Attending: Internal Medicine

## 2018-12-06 ENCOUNTER — Encounter: Payer: Self-pay | Admitting: Physician Assistant

## 2018-12-06 VITALS — BP 132/70 | HR 92 | Ht 61.5 in | Wt 290.0 lb

## 2018-12-06 DIAGNOSIS — I5032 Chronic diastolic (congestive) heart failure: Secondary | ICD-10-CM | POA: Insufficient documentation

## 2018-12-06 DIAGNOSIS — Z952 Presence of prosthetic heart valve: Secondary | ICD-10-CM | POA: Diagnosis not present

## 2018-12-06 DIAGNOSIS — I10 Essential (primary) hypertension: Secondary | ICD-10-CM

## 2018-12-06 LAB — ECHOCARDIOGRAM COMPLETE
Height: 61.5 in
Weight: 4640 oz

## 2018-12-06 NOTE — Patient Instructions (Signed)
Medication Instructions:  Your provider recommends that you continue on your current medications as directed. Please refer to the Current Medication list given to you today.   If you need a refill on your cardiac medications before your next appointment, please call your pharmacy.    Follow-Up: At [facility], you and your health needs are our priority.  As part of our continuing mission to provide you with exceptional heart care, we have created designated Provider Care Teams.  These Care Teams include your primary Cardiologist (physician) and Advanced Practice Providers (APPs -  Physician Assistants and Nurse Practitioners) who all work together to provide you with the care you need, when you need it. You will need a follow up appointment in:  12 months.  Please call our office 2 months in advance to schedule this appointment.  You may see Dr. Burt Knack or one of the following Advanced Practice Providers on your designated Care Team: Richardson Dopp, PA-C [place], [place] . Daune Perch, NP

## 2018-12-29 ENCOUNTER — Other Ambulatory Visit: Payer: Self-pay | Admitting: Physician Assistant

## 2019-01-29 ENCOUNTER — Ambulatory Visit
Admission: RE | Admit: 2019-01-29 | Discharge: 2019-01-29 | Disposition: A | Discharge status: Home / Self Care | Payer: Medicare Other | Source: Ambulatory Visit | Attending: Family Medicine | Admitting: Family Medicine

## 2019-01-29 ENCOUNTER — Other Ambulatory Visit: Payer: Self-pay

## 2019-01-29 DIAGNOSIS — E2839 Other primary ovarian failure: Secondary | ICD-10-CM

## 2019-12-26 ENCOUNTER — Other Ambulatory Visit: Payer: Self-pay | Admitting: Gastroenterology

## 2020-01-11 ENCOUNTER — Other Ambulatory Visit: Payer: Self-pay | Admitting: Physician Assistant

## 2020-01-11 NOTE — Telephone Encounter (Signed)
Because this medication is not a cardiac medication, we have to send to the provider to approve the refill. How many pills are you dispensing and how many refills are you approving? Please advise

## 2020-01-11 NOTE — Telephone Encounter (Signed)
Pt's pharmacy is requesting a refill on amoxicillin. Would Dr. Burt Knack like to refill this medication? Please address

## 2020-01-28 ENCOUNTER — Ambulatory Visit
Admission: RE | Admit: 2020-01-28 | Discharge: 2020-01-28 | Disposition: A | Discharge status: Home / Self Care | Payer: Medicare Other | Source: Ambulatory Visit | Attending: Family Medicine | Admitting: Family Medicine

## 2020-01-28 ENCOUNTER — Other Ambulatory Visit: Payer: Self-pay | Admitting: Family Medicine

## 2020-01-28 ENCOUNTER — Ambulatory Visit: Payer: Medicare Other | Admitting: Cardiovascular Disease

## 2020-01-28 ENCOUNTER — Other Ambulatory Visit: Payer: Self-pay

## 2020-01-28 ENCOUNTER — Encounter: Payer: Self-pay | Admitting: Cardiovascular Disease

## 2020-01-28 VITALS — BP 110/50 | HR 94 | Ht 61.0 in | Wt 261.0 lb

## 2020-01-28 DIAGNOSIS — Z952 Presence of prosthetic heart valve: Secondary | ICD-10-CM

## 2020-01-28 DIAGNOSIS — E1369 Other specified diabetes mellitus with other specified complication: Secondary | ICD-10-CM

## 2020-01-28 DIAGNOSIS — I10 Essential (primary) hypertension: Secondary | ICD-10-CM | POA: Diagnosis not present

## 2020-01-28 DIAGNOSIS — I5032 Chronic diastolic (congestive) heart failure: Secondary | ICD-10-CM | POA: Diagnosis not present

## 2020-01-28 NOTE — Progress Notes (Signed)
Cardiology Office Note:    Date:  01/30/2020   ID:  BEVELYN [name], DOB 1941/02/10, MRN [id number]  PCP:  Leonard Downing, MD  [facility] Cardiologist:  [name], MD  [place] Electrophysiologist:  None   Referring MD: Leonard Downing, *   Chief Complaint  Patient presents with  . Aortic Stenosis    History of Present Illness:    [name] is a 79 y.o. female with a hx of severe aortic stenosis who underwent TAVR December 06, 2017.  Comorbid medical problems include hypertension, mixed hyperlipidemia, obesity, fibromyalgia, type 2 diabetes, and chronic diastolic heart failure.  At the time of TAVR, she underwent valve replacement with a 26 mm Medtronic evolute valve via transfemoral approach.  She had no paravalvular regurgitation and her mean transvalvular gradient was 7 mmHg.  She developed transient left bundle branch block, but this resolved on follow-up EKG tracings.  The patient was last seen by Nell Range December 06, 2018.  She was felt to be clinically stable at that time.  An echo that same date demonstrated normal LV function with an LVEF of 60 to 65%, moderate LVH, normal function of her aortic TAVR bioprosthesis with a mean pressure gradient of 10 mmHg and trivial paravalvular regurgitation.  She is here alone today. Comes in a wheelchair because she's having a problem with her foot, otherwise walks with a cane when she's out of the house. No shortness of breath, chest pain, or palpitations. She feels really well since undergoing TAVR and notes marked improvement in her breathing. She follows SBE prophylaxis per guidelines. She has intentionally lost 60# over the past 2 years and feels really good about that. Continues to work on diet and weight loss.   Past Medical History:  Diagnosis Date  . Abnormal thyroid biopsy 2018   results were negative.  . Anemia    "when I was alot younger" (12/06/2017)  . Anxiety    self reported  . Arthritis     "almost all over; used to cry w/it when I was in my teens" (12/06/2017)  . CHF (congestive heart failure) ([place])   . Chronic back pain    "all over" (12/06/2017)  . Depression    "lost my son last year to cancer; I tended to him; he lived w/me" (12/06/2017)  . Diverticulosis of colon (without mention of hemorrhage)   . External hemorrhoids without mention of complication   . Fatty liver disease, nonalcoholic   . Fibromyalgia   . History of kidney stones   . History of stomach ulcers 1970  . Hyperlipemia   . Hypertension   . IBS (irritable bowel syndrome)   . Morbid obesity ([place])   . Psoriasis   . 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 Severe aortic stenosis   . Sinus headache   . Sleep apnea    "was told I do; never have had any problems w/it" (12/06/2017)  . Type II diabetes mellitus ([place])     Past Surgical History:  Procedure Laterality Date  . ABDOMINAL ADHESION SURGERY     "took 2 out"  . ABDOMINAL HYSTERECTOMY    . APPENDECTOMY    . BACK SURGERY    . BIOPSY THYROID  2018   results were negative.  Marland Kitchen BREAST LUMPECTOMY Left X 1   "benign"  . CATARACT EXTRACTION W/ INTRAOCULAR LENS  IMPLANT, BILATERAL Bilateral   . CATARACT EXTRACTION W/ INTRAOCULAR LENS IMPLANT Bilateral 2017  . EYE SURGERY    . HEMORRHOID BANDING    . LAPAROSCOPIC CHOLECYSTECTOMY    . LEFT AND RIGHT HEART CATHETERIZATION WITH CORONARY ANGIOGRAM N/A 11/15/2013   Procedure: LEFT AND RIGHT HEART CATHETERIZATION WITH CORONARY ANGIOGRAM;  Surgeon: Blane Ohara, MD;  Location: [facility] CATH LAB;  Service: Cardiovascular;  Laterality: N/A;  . LUMBAR [place]    . RETINAL DETACHMENT SURGERY Right   . RIGHT/LEFT HEART CATH AND CORONARY ANGIOGRAPHY N/A 11/02/2017   Procedure: RIGHT/LEFT HEART CATH AND CORONARY ANGIOGRAPHY;  Surgeon: Burnell Blanks, MD;  Location: [facility] CV LAB;  Service: Cardiovascular;  Laterality: N/A;  . TEE WITHOUT CARDIOVERSION N/A 12/06/2017   Procedure: TRANSESOPHAGEAL ECHOCARDIOGRAM (TEE);  Surgeon: [name], MD;  Location: [place];  Service: Open Heart Surgery;  Laterality: N/A;  . TONSILLECTOMY    . TRANSCATHETER AORTIC VALVE REPLACEMENT, TRANSFEMORAL  12/06/2017  . TRANSCATHETER AORTIC VALVE REPLACEMENT, TRANSFEMORAL N/A 12/06/2017   Procedure: TRANSCATHETER AORTIC VALVE REPLACEMENT, TRANSFEMORAL;  Surgeon: [name], MD;  Location: [place] [facility];  Service: Open Heart Surgery;  Laterality: N/A;  . TRANSESOPHAGEAL ECHOCARDIOGRAM  12/06/2017    Current Medications: Current Meds  Medication Sig  . amoxicillin (AMOXIL) 500 MG tablet TAKE 4 TABLETS BY MOUTH ONE HOUR PRIOR TO DENTAL VISITS.  Marland Kitchen aspirin EC 81 MG tablet Take 1 tablet (81 mg total) by mouth daily.  Marland Kitchen atorvastatin (LIPITOR) 40 MG tablet Take 40 mg by mouth daily.  . Cholecalciferol (VITAMIN D-3) 5000 units TABS Take 5,000 Units by mouth daily.  . clopidogrel (PLAVIX) 75 MG tablet Take 1 tablet (75 mg total) by mouth daily with breakfast.  . Cranberry 250 MG TABS Take 250 mg by mouth 2 (two) times daily as needed (bladder issues).   . cyclobenzaprine (FLEXERIL) 10 MG tablet Take 10 mg by mouth 3 (three) times daily as needed for muscle spasms.  . enalapril (VASOTEC) 10 MG tablet Take 10 mg by mouth daily.  . furosemide (LASIX) 80 MG tablet Take 1 tablet (80 mg total) by mouth daily.  Marland Kitchen glimepiride (AMARYL) 4 MG tablet Take 4 mg by mouth daily with breakfast.  . metFORMIN (GLUMETZA) 1000 MG (MOD) 24 hr tablet Take 1,000 mg by mouth 2 (two) times daily with a meal.   . Multiple Vitamin (MULTIVITAMIN WITH MINERALS) TABS tablet Take 1 tablet by mouth daily.  . potassium chloride SA (K-DUR,KLOR-CON) 20 MEQ tablet Take 2 tablets (40 mEq total) by mouth daily.  . traMADol (ULTRAM) 50 MG tablet Take 50 mg by mouth every 6 (six) hours as needed for moderate pain.       Allergies:   Sulfonamide derivatives, Castor oil, Amoxicillin, Demerol [meperidine], and Hydrocodone   Social History   Socioeconomic History  . Marital status: Widowed    Spouse name: Not on file  . Number of children: Not on file  . Years of education: Not on file  . Highest education level: Not on file  Occupational History  . Not on file  Tobacco Use  . Smoking status: Former Smoker    Packs/day: 1.75    Years: 32.00    Pack years: 56.00    Types: Cigarettes    Quit date: 2002    Years since quitting: 19.9  . Smokeless tobacco: Never Used  Vaping Use  . Vaping Use: Never used  Substance and Sexual Activity  . Alcohol use: No  .  Drug use: Never  . Sexual activity: Not Currently  Other Topics Concern  . Not on file  Social History Narrative  . Not on file   Social Determinants of Health   Financial Resource Strain:   . Difficulty of Paying Living Expenses: Not on file  Food Insecurity:   . Worried About Charity fundraiser in the Last Year: Not on file  . Ran Out of Food in the Last Year: Not on file  Transportation Needs:   . Lack of Transportation (Medical): Not on file  . Lack of Transportation (Non-Medical): Not on file  Physical Activity:   . Days of Exercise per Week: Not on file  . Minutes of Exercise per Session: Not on file  Stress:   . Feeling of Stress : Not on file  Social Connections:   . Frequency of Communication with Friends and Family: Not on file  . Frequency of Social Gatherings with Friends and Family: Not on file  . Attends Religious Services: Not on file  . Active Member of Clubs or Organizations: Not on file  . Attends Archivist Meetings: Not on file  . Marital Status: Not on file     Family History: The patient's family history includes Diabetes in her maternal grandmother; Heart disease in her maternal grandfather; Lung cancer in her mother. There is no history of Colon cancer.  ROS:   Please see the history of  present illness.    All other systems reviewed and are negative.  EKGs/Labs/Other Studies Reviewed:    The following studies were reviewed today: Echo 12-06-2018: IMPRESSIONS    1. Left ventricular ejection fraction, by visual estimation, is 60 to  65%. The left ventricle has normal function. There is moderately increased  left ventricular hypertrophy.  2. Elevated left ventricular end-diastolic pressure.  3. Left ventricular diastolic Doppler parameters are consistent with  impaired relaxation pattern of LV diastolic filling.  4. Global right ventricle has normal systolic function.The right  ventricular size is normal. No increase in right ventricular wall  thickness.  5. Left atrial size was normal.  6. Right atrial size was normal.  7. Mild mitral annular calcification.  8. The mitral valve is abnormal. Trace mitral valve regurgitation. Mild  mitral stenosis.  9. The tricuspid valve is grossly normal. Tricuspid valve regurgitation  is mild.  10. Aortic valve mean gradient measures 9.8 mmHg.  11. Aortic valve peak gradient measures 18.1 mmHg.  12. Aortic valve regurgitation is trivial by color flow Doppler.  13. The pulmonic valve was grossly normal. Pulmonic valve regurgitation is  not visualized by color flow Doppler.  14. Mildly elevated pulmonary artery systolic pressure.  15. MV peak gradient, 11.4 mmHg.   In comparison to the previous echocardiogram(s): Prior examinations were  reviewed in a side by side comparison of images. 12/2017: LVEF 55-60%,  TAVR mean gradient 9 mmHg.   EKG:  EKG is ordered today.  The ekg ordered today demonstrates NSR 94 bpm, LAFB, otherwise normal  Recent Labs: No results found for requested labs within last 8760 hours.  Recent Lipid Panel No results found for: CHOL, TRIG, HDL, CHOLHDL, VLDL, LDLCALC, LDLDIRECT   Risk Assessment/Calculations:       Physical Exam:    VS:  BP (!) 110/50   Pulse 94   Ht 5\' 1"  (1.549 m)    Wt 261 lb (118.4 kg)   SpO2 96%   BMI 49.32 kg/m     Wt Readings from Last  3 Encounters:  01/28/20 261 lb (118.4 kg)  12/06/18 290 lb (131.5 kg)  04/12/18 (!) 304 lb 12.8 oz (138.3 kg)     GEN:  Well nourished, well developed in no acute distress HEENT: Normal NECK: No JVD; No carotid bruits LYMPHATICS: No lymphadenopathy CARDIAC: RRR, 2/6 early peaking systolic murmur at RESPIRATORY:  Clear to auscultation without rales, wheezing or rhonchi  ABDOMEN: Soft, non-tender, non-distended MUSCULOSKELETAL:  Trace bilateral pretibial edema; No deformity  SKIN: Warm and dry, chronic stasis changes both lower legs NEUROLOGIC:  Alert and oriented x 3 PSYCHIATRIC:  Normal affect   ASSESSMENT:    1. S/P TAVR (transcatheter aortic valve replacement)   2. Essential hypertension   3. Chronic diastolic CHF (congestive heart failure) (HCC)    PLAN:    In order of problems listed above:  1. The patient is clinically stable with normal function of her transcatheter heart valve.  She follows SBE prophylaxis is recommended.  She has been off of clopidogrel now for over a year.  She will return in 1 year for a follow-up office visit and echocardiogram at that time.  Her surveillance echo studies have demonstrated normal function of her TAVR bioprosthesis with no significant regurgitation and normal transvalvular gradients. 2. Blood pressure controlled on current medical therapy. 3. The patient has done a good job with weight loss.  She understands the control of her blood pressure, sodium restriction, and medication compliance are key to controlling chronic heart failure.  Shared Decision Making/Informed Consent      Medication Adjustments/Labs and Tests Ordered: Current medicines are reviewed at length with the patient today.  Concerns regarding medicines are outlined above.  Orders Placed This Encounter  Procedures  . EKG 12-Lead   No orders of the defined types were placed in this  encounter.   Patient Instructions  Medication Instructions:  Your provider recommends that you continue on your current medications as directed. Please refer to the Current Medication list given to you today.   *If you need a refill on your cardiac medications before your next appointment, please call your pharmacy*  Follow-Up: You will be called to arrange your 1 year echo and office visit when Dr. Antionette Char December 2022 schedule is available.    Signed, [name], MD  01/30/2020 8:21 AM    [facility]

## 2020-01-28 NOTE — Patient Instructions (Signed)
Medication Instructions:  Your provider recommends that you continue on your current medications as directed. Please refer to the Current Medication list given to you today.   *If you need a refill on your cardiac medications before your next appointment, please call your pharmacy*  Follow-Up: You will be called to arrange your 1 year echo and office visit when Dr. Antionette Char December 2022 schedule is available.

## 2020-02-06 ENCOUNTER — Other Ambulatory Visit: Payer: Self-pay | Admitting: Gastroenterology

## 2020-02-07 NOTE — Progress Notes (Signed)
Attempted to obtain medical history via telephone, unable to reach at this time. I left a voicemail to return pre surgical testing department's phone call.  

## 2020-02-08 ENCOUNTER — Other Ambulatory Visit (HOSPITAL_COMMUNITY): Payer: Self-pay | Admitting: Orthopedic Surgery

## 2020-02-08 ENCOUNTER — Other Ambulatory Visit (HOSPITAL_COMMUNITY)
Admission: RE | Admit: 2020-02-08 | Discharge: 2020-02-08 | Disposition: A | Discharge status: Home / Self Care | Payer: Medicare Other | Source: Ambulatory Visit | Attending: Gastroenterology | Admitting: Gastroenterology

## 2020-02-08 DIAGNOSIS — Z20822 Contact with and (suspected) exposure to covid-19: Secondary | ICD-10-CM | POA: Insufficient documentation

## 2020-02-08 DIAGNOSIS — Z01812 Encounter for preprocedural laboratory examination: Secondary | ICD-10-CM | POA: Insufficient documentation

## 2020-02-09 LAB — SARS CORONAVIRUS 2 (TAT 6-24 HRS): SARS Coronavirus 2: NEGATIVE

## 2020-02-11 ENCOUNTER — Encounter (HOSPITAL_COMMUNITY): Payer: Self-pay | Admitting: Gastroenterology

## 2020-02-11 NOTE — Progress Notes (Signed)
Received message from [place] at Dr. Renelda Loma office that patient is going to proceed with surgery at [facility] on 02/12/20 and cancel her other procedure. Reviewed chart with Dr. Sabra Heck at [facility] and patient does not need any further clearance at this time.

## 2020-02-12 ENCOUNTER — Ambulatory Visit (HOSPITAL_COMMUNITY)
Admission: RE | Admit: 2020-02-12 | Discharge: 2020-02-12 | Disposition: A | Discharge status: Home / Self Care | Payer: Medicare Other | Attending: Orthopedic Surgery | Admitting: Orthopedic Surgery

## 2020-02-12 ENCOUNTER — Ambulatory Visit (HOSPITAL_BASED_OUTPATIENT_CLINIC_OR_DEPARTMENT_OTHER): Payer: Medicare Other | Admitting: Anesthesiology

## 2020-02-12 ENCOUNTER — Encounter (HOSPITAL_BASED_OUTPATIENT_CLINIC_OR_DEPARTMENT_OTHER)
Admission: RE | Disposition: A | Discharge status: Home / Self Care | Payer: Self-pay | Source: Home / Self Care | Attending: Orthopedic Surgery

## 2020-02-12 ENCOUNTER — Encounter (HOSPITAL_BASED_OUTPATIENT_CLINIC_OR_DEPARTMENT_OTHER): Payer: Self-pay | Admitting: Orthopedic Surgery

## 2020-02-12 ENCOUNTER — Other Ambulatory Visit: Payer: Self-pay

## 2020-02-12 DIAGNOSIS — Z7984 Long term (current) use of oral hypoglycemic drugs: Secondary | ICD-10-CM | POA: Insufficient documentation

## 2020-02-12 DIAGNOSIS — M869 Osteomyelitis, unspecified: Secondary | ICD-10-CM | POA: Insufficient documentation

## 2020-02-12 DIAGNOSIS — Z87891 Personal history of nicotine dependence: Secondary | ICD-10-CM | POA: Insufficient documentation

## 2020-02-12 DIAGNOSIS — Z79899 Other long term (current) drug therapy: Secondary | ICD-10-CM | POA: Diagnosis not present

## 2020-02-12 DIAGNOSIS — E1169 Type 2 diabetes mellitus with other specified complication: Secondary | ICD-10-CM | POA: Diagnosis not present

## 2020-02-12 DIAGNOSIS — Z7982 Long term (current) use of aspirin: Secondary | ICD-10-CM | POA: Diagnosis not present

## 2020-02-12 DIAGNOSIS — E11621 Type 2 diabetes mellitus with foot ulcer: Secondary | ICD-10-CM | POA: Diagnosis not present

## 2020-02-12 DIAGNOSIS — L97519 Non-pressure chronic ulcer of other part of right foot with unspecified severity: Secondary | ICD-10-CM | POA: Insufficient documentation

## 2020-02-12 DIAGNOSIS — Z952 Presence of prosthetic heart valve: Secondary | ICD-10-CM | POA: Diagnosis not present

## 2020-02-12 HISTORY — PX: AMPUTATION TOE: SHX6595

## 2020-02-12 LAB — BASIC METABOLIC PANEL
Anion gap: 11 (ref 5–15)
BUN: 17 mg/dL (ref 8–23)
CO2: 26 mmol/L (ref 22–32)
Calcium: 8.7 mg/dL — ABNORMAL LOW (ref 8.9–10.3)
Chloride: 103 mmol/L (ref 98–111)
Creatinine, Ser: 1 mg/dL (ref 0.44–1.00)
GFR, Estimated: 58 mL/min — ABNORMAL LOW (ref >60)
Glucose, Bld: 148 mg/dL — ABNORMAL HIGH (ref 70–99)
Potassium: 3.1 mmol/L — ABNORMAL LOW (ref 3.5–5.1)
Sodium: 140 mmol/L (ref 135–145)

## 2020-02-12 LAB — GLUCOSE, CAPILLARY
Glucose-Capillary: 143 mg/dL — ABNORMAL HIGH (ref 70–99)
Glucose-Capillary: 139 mg/dL — ABNORMAL HIGH (ref 70–99)

## 2020-02-12 SURGERY — COLONOSCOPY WITH PROPOFOL
Anesthesia: Monitor Anesthesia Care

## 2020-02-12 SURGERY — AMPUTATION, TOE
Anesthesia: Monitor Anesthesia Care | Site: Toe | Laterality: Right

## 2020-02-12 MED ORDER — CEFAZOLIN SODIUM-DEXTROSE 2-4 GM/100ML-% IV SOLN
INTRAVENOUS | Status: AC
  Filled 2020-02-12: qty 100

## 2020-02-12 MED ORDER — ACETAMINOPHEN 500 MG PO TABS
1000.0000 mg | ORAL_TABLET | Freq: Once | ORAL | Status: AC
  Administered 2020-02-12: 11:00:00 1000 mg via ORAL

## 2020-02-12 MED ORDER — ROPIVACAINE HCL 5 MG/ML IJ SOLN: INTRAMUSCULAR | Status: DC | PRN

## 2020-02-12 MED ORDER — MIDAZOLAM HCL 2 MG/2ML IJ SOLN
INTRAMUSCULAR | Status: AC
  Filled 2020-02-12: qty 2

## 2020-02-12 MED ORDER — FENTANYL CITRATE (PF) 100 MCG/2ML IJ SOLN: 25.0000 ug | INTRAMUSCULAR | Status: DC | PRN

## 2020-02-12 MED ORDER — SODIUM CHLORIDE 0.9 % IV SOLN: INTRAVENOUS | Status: DC

## 2020-02-12 MED ORDER — PROPOFOL 500 MG/50ML IV EMUL
INTRAVENOUS | Status: DC | PRN
  Administered 2020-02-12: 75 ug/kg/min via INTRAVENOUS

## 2020-02-12 MED ORDER — CEPHALEXIN 500 MG PO CAPS: 500.0000 mg | ORAL_CAPSULE | Freq: Four times a day (QID) | ORAL | 0 refills | Status: AC

## 2020-02-12 MED ORDER — FENTANYL CITRATE (PF) 100 MCG/2ML IJ SOLN
INTRAMUSCULAR | Status: AC
  Filled 2020-02-12: qty 2

## 2020-02-12 MED ORDER — CEFAZOLIN SODIUM-DEXTROSE 1-4 GM/50ML-% IV SOLN
INTRAVENOUS | Status: AC
  Filled 2020-02-12: qty 50

## 2020-02-12 MED ORDER — PROPOFOL 10 MG/ML IV BOLUS
INTRAVENOUS | Status: DC | PRN
  Administered 2020-02-12 (×2): 20 mg via INTRAVENOUS

## 2020-02-12 MED ORDER — ROPIVACAINE HCL 5 MG/ML IJ SOLN
INTRAMUSCULAR | Status: DC | PRN
  Administered 2020-02-12: 30 mL via PERINEURAL
  Administered 2020-02-12: 20 mL via PERINEURAL

## 2020-02-12 MED ORDER — ONDANSETRON HCL 4 MG/2ML IJ SOLN
INTRAMUSCULAR | Status: DC | PRN
  Administered 2020-02-12: 4 mg via INTRAVENOUS

## 2020-02-12 MED ORDER — VANCOMYCIN HCL 500 MG IV SOLR
INTRAVENOUS | Status: AC
  Filled 2020-02-12: qty 500

## 2020-02-12 MED ORDER — DEXAMETHASONE SODIUM PHOSPHATE 10 MG/ML IJ SOLN
INTRAMUSCULAR | Status: DC | PRN
  Administered 2020-02-12 (×2): 5 mg

## 2020-02-12 MED ORDER — LACTATED RINGERS IV SOLN: INTRAVENOUS | Status: DC

## 2020-02-12 MED ORDER — MIDAZOLAM HCL 2 MG/2ML IJ SOLN
2.0000 mg | Freq: Once | INTRAMUSCULAR | Status: AC
  Administered 2020-02-12: 14:00:00 1 mg via INTRAVENOUS

## 2020-02-12 MED ORDER — 0.9 % SODIUM CHLORIDE (POUR BTL) OPTIME
TOPICAL | Status: DC | PRN
  Administered 2020-02-12: 16:00:00 200 mL

## 2020-02-12 MED ORDER — ACETAMINOPHEN 500 MG PO TABS
ORAL_TABLET | ORAL | Status: AC
  Filled 2020-02-12: qty 2

## 2020-02-12 MED ORDER — VANCOMYCIN HCL 500 MG IV SOLR
INTRAVENOUS | Status: DC | PRN
  Administered 2020-02-12: 500 mg via TOPICAL

## 2020-02-12 MED ORDER — PROPOFOL 500 MG/50ML IV EMUL
INTRAVENOUS | Status: AC
  Filled 2020-02-12: qty 50

## 2020-02-12 MED ORDER — CEFAZOLIN SODIUM-DEXTROSE 2-4 GM/100ML-% IV SOLN
2.0000 g | INTRAVENOUS | Status: AC
  Administered 2020-02-12: 16:00:00 3 g via INTRAVENOUS

## 2020-02-12 MED ORDER — FENTANYL CITRATE (PF) 100 MCG/2ML IJ SOLN
100.0000 ug | Freq: Once | INTRAMUSCULAR | Status: AC
Start: 2020-02-12 — End: 2020-02-12
  Administered 2020-02-12: 14:00:00 50 ug via INTRAVENOUS

## 2020-02-12 SURGICAL SUPPLY — 61 items
BLADE AVERAGE 25X9 (BLADE) ×1 IMPLANT
BLADE OSC/SAG .038X5.5 CUT EDG (BLADE) IMPLANT
BLADE SURG 10 STRL SS (BLADE) ×1 IMPLANT
BLADE SURG 15 STRL LF DISP TIS (BLADE) ×1 IMPLANT
BLADE SURG 15 STRL SS (BLADE) ×2
BNDG COHESIVE 4X5 TAN STRL (GAUZE/BANDAGES/DRESSINGS) ×2 IMPLANT
BNDG CONFORM 3 STRL LF (GAUZE/BANDAGES/DRESSINGS) IMPLANT
BNDG ESMARK 4X9 LF (GAUZE/BANDAGES/DRESSINGS) ×2 IMPLANT
CHLORAPREP W/TINT 26 (MISCELLANEOUS) ×2 IMPLANT
COVER BACK TABLE 60X90IN (DRAPES) ×2 IMPLANT
COVER WAND RF STERILE (DRAPES) IMPLANT
CUFF TOURN SGL QUICK 24 (TOURNIQUET CUFF)
CUFF TRNQT CYL 24X4X16.5-23 (TOURNIQUET CUFF) IMPLANT
DECANTER SPIKE VIAL GLASS SM (MISCELLANEOUS) IMPLANT
DRAPE EXTREMITY T 121X128X90 (DISPOSABLE) ×2 IMPLANT
DRAPE SURG 17X23 STRL (DRAPES) ×2 IMPLANT
DRAPE U-SHAPE 47X51 STRL (DRAPES) ×2 IMPLANT
DRSG EMULSION OIL 3X3 NADH (GAUZE/BANDAGES/DRESSINGS) IMPLANT
DRSG MEPITEL 4X7.2 (GAUZE/BANDAGES/DRESSINGS) ×2 IMPLANT
DRSG PAD ABDOMINAL 8X10 ST (GAUZE/BANDAGES/DRESSINGS) ×1 IMPLANT
ELECT REM PT RETURN 9FT ADLT (ELECTROSURGICAL) ×2
ELECTRODE REM PT RTRN 9FT ADLT (ELECTROSURGICAL) ×1 IMPLANT
GAUZE SPONGE 4X4 12PLY STRL (GAUZE/BANDAGES/DRESSINGS) ×2 IMPLANT
GLOVE BIO SURGEON STRL SZ8 (GLOVE) ×2 IMPLANT
GLOVE ECLIPSE 8.0 STRL XLNG CF (GLOVE) ×2 IMPLANT
GLOVE SRG 8 PF TXTR STRL LF DI (GLOVE) ×2 IMPLANT
GLOVE SURG SS PI 7.0 STRL IVOR (GLOVE) ×1 IMPLANT
GLOVE SURG UNDER POLY LF SZ7 (GLOVE) ×2 IMPLANT
GLOVE SURG UNDER POLY LF SZ8 (GLOVE) ×4
GOWN STRL REUS W/ TWL LRG LVL3 (GOWN DISPOSABLE) ×1 IMPLANT
GOWN STRL REUS W/ TWL XL LVL3 (GOWN DISPOSABLE) ×2 IMPLANT
GOWN STRL REUS W/TWL LRG LVL3 (GOWN DISPOSABLE) ×2
GOWN STRL REUS W/TWL XL LVL3 (GOWN DISPOSABLE) ×4
NDL HYPO 25X1 1.5 SAFETY (NEEDLE) IMPLANT
NDL SAFETY ECLIPSE 18X1.5 (NEEDLE) IMPLANT
NEEDLE HYPO 18GX1.5 SHARP (NEEDLE)
NEEDLE HYPO 25X1 1.5 SAFETY (NEEDLE) IMPLANT
NS IRRIG 1000ML POUR BTL (IV SOLUTION) ×2 IMPLANT
PACK BASIN DAY SURGERY FS (CUSTOM PROCEDURE TRAY) ×2 IMPLANT
PAD CAST 4YDX4 CTTN HI CHSV (CAST SUPPLIES) ×1 IMPLANT
PADDING CAST COTTON 4X4 STRL (CAST SUPPLIES) ×2
PENCIL SMOKE EVACUATOR (MISCELLANEOUS) ×2 IMPLANT
SANITIZER HAND PURELL 535ML FO (MISCELLANEOUS) ×2 IMPLANT
SHEET MEDIUM DRAPE 40X70 STRL (DRAPES) ×2 IMPLANT
SLEEVE SCD COMPRESS KNEE MED (MISCELLANEOUS) ×2 IMPLANT
SPONGE LAP 18X18 RF (DISPOSABLE) ×2 IMPLANT
STOCKINETTE 6  STRL (DRAPES) ×1
STOCKINETTE 6 STRL (DRAPES) ×1 IMPLANT
SUCTION FRAZIER HANDLE 10FR (MISCELLANEOUS) ×2
SUCTION TUBE FRAZIER 10FR DISP (MISCELLANEOUS) IMPLANT
SUT ETHILON 2 0 FS 18 (SUTURE) ×2 IMPLANT
SUT ETHILON 2 0 FSLX (SUTURE) IMPLANT
SUT ETHILON 3 0 PS 1 (SUTURE) IMPLANT
SUT MNCRL AB 3-0 PS2 18 (SUTURE) IMPLANT
SWAB COLLECTION DEVICE MRSA (MISCELLANEOUS) IMPLANT
SWAB CULTURE ESWAB REG 1ML (MISCELLANEOUS) IMPLANT
SYR BULB EAR ULCER 3OZ GRN STR (SYRINGE) ×2 IMPLANT
SYR CONTROL 10ML LL (SYRINGE) IMPLANT
TOWEL GREEN STERILE FF (TOWEL DISPOSABLE) ×2 IMPLANT
TUBE CONNECTING 20X1/4 (TUBING) ×1 IMPLANT
UNDERPAD 30X36 HEAVY ABSORB (UNDERPADS AND DIAPERS) ×2 IMPLANT

## 2020-02-12 NOTE — Transfer of Care (Signed)
Immediate Anesthesia Transfer of Care Note  Patient: Lacreshia A Michelsen  Procedure(s) Performed: Right hallux and second toe amputation (Right Toe)  Patient Location: PACU  Anesthesia Type:Regional  Level of Consciousness: awake, alert  and oriented  Airway & Oxygen Therapy: Patient Spontanous Breathing and Patient connected to face mask oxygen  Post-op Assessment: Report given to RN and Post -op Vital signs reviewed and stable  Post vital signs: Reviewed and stable  Last Vitals:  Vitals Value Taken Time  BP    Temp    Pulse 69 02/12/20 1623  Resp 11 02/12/20 1623  SpO2 100 % 02/12/20 1623  Vitals shown include unvalidated device data.  Last Pain:  Vitals:   02/12/20 1105  TempSrc: Oral  PainSc: 0-No pain         Complications: No complications documented.

## 2020-02-12 NOTE — Progress Notes (Signed)
Assisted Dr. Woodrum with right, ultrasound guided, popliteal, adductor canal block. Side rails up, monitors on throughout procedure. See vital signs in flow sheet. Tolerated Procedure well. °

## 2020-02-12 NOTE — Anesthesia Postprocedure Evaluation (Signed)
Anesthesia Post Note  Patient: [name]  Procedure(s) Performed: Right hallux and second toe amputation (Right Toe)     Patient location during evaluation: PACU Anesthesia Type: Regional Level of consciousness: awake and alert Pain management: pain level controlled Vital Signs Assessment: post-procedure vital signs reviewed and stable Respiratory status: spontaneous breathing, nonlabored ventilation, respiratory function stable and patient connected to nasal cannula oxygen Cardiovascular status: stable and blood pressure returned to baseline Postop Assessment: no apparent nausea or vomiting Anesthetic complications: no   No complications documented.  Last Vitals:  Vitals:   02/12/20 1633 02/12/20 1645  BP: (!) 151/64 (!) 136/94  Pulse: 69 62  Resp: (!) 23 18  Temp:    SpO2: 98% 95%    Last Pain:  Vitals:   02/12/20 1730  TempSrc:   PainSc: 0-No pain                 Barnet Glasgow

## 2020-02-12 NOTE — Anesthesia Procedure Notes (Signed)
Anesthesia Regional Block: Popliteal block   Pre-Anesthetic Checklist: ,, timeout performed, Correct Patient, Correct Site, Correct Laterality, Correct Procedure, Correct Position, site marked, Risks and benefits discussed,  Surgical consent,  Pre-op evaluation,  At surgeon's request and post-op pain management  Laterality: Right  Prep: Maximum Sterile Barrier Precautions used, chloraprep       Needles:  Injection technique: Single-shot  Needle Type: Echogenic Stimulator Needle     Needle Length: 9cm  Needle Gauge: 22     Additional Needles:   Procedures:,,,, ultrasound used (permanent image in chart),,,,  Narrative:  Start time: 02/12/2020 2:20 PM End time: 02/12/2020 2:29 PM Injection made incrementally with aspirations every 5 mL.  Performed by: Personally  Anesthesiologist: Freddrick March, MD  Additional Notes: Monitors applied. No increased pain on injection. No increased resistance to injection. Injection made in 5cc increments. Good needle visualization. Patient tolerated procedure well.

## 2020-02-12 NOTE — Discharge Instructions (Addendum)
Cheryl Simmer, MD [facility]  Please read the following information regarding your care after surgery.  Medications  You only need a prescription for the narcotic pain medicine (ex. oxycodone, Percocet, Norco).  All of the other medicines listed below are available over the counter. X acetominophen (Tylenol) 650 mg every 4-6 hours as you need for minor to moderate pain   Weight Bearing X Bear weight only on your operated foot in the post-op shoe.   Cast / Splint / Dressing X Keep your splint, cast or dressing clean and dry.  Don't put anything (coat hanger, pencil, etc) down inside of it.  If it gets damp, use a hair dryer on the cool setting to dry it.  If it gets soaked, call the office to schedule an appointment for a cast change.   After your dressing, cast or splint is removed; you may shower, but do not soak or scrub the wound.  Allow the water to run over it, and then gently pat it dry.  Swelling It is normal for you to have swelling where you had surgery.  To reduce swelling and pain, keep your toes above your nose for at least 3 days after surgery.  It may be necessary to keep your foot or leg elevated for several weeks.  If it hurts, it should be elevated.  Follow Up Call my office at [phone] when you are discharged from the hospital or surgery center to schedule an appointment to be seen two weeks after surgery.  Call my office at [phone] if you develop a fever >101.5 F, nausea, vomiting, bleeding from the surgical site or severe pain.     Post Anesthesia Home Care Instructions  Activity: Get plenty of rest for the remainder of the day. A responsible individual must stay with you for 24 hours following the procedure.  For the next 24 hours, DO NOT: -Drive a car -Paediatric nurse -Drink alcoholic beverages -Take any medication unless instructed by your physician -Make any legal decisions or sign important papers.  Meals: Start with liquid foods such as  gelatin or soup. Progress to regular foods as tolerated. Avoid greasy, spicy, heavy foods. If nausea and/or vomiting occur, drink only clear liquids until the nausea and/or vomiting subsides. Call your physician if vomiting continues.  Special Instructions/Symptoms: Your throat may feel dry or sore from the anesthesia or the breathing tube placed in your throat during surgery. If this causes discomfort, gargle with warm salt water. The discomfort should disappear within 24 hours.  If you had a scopolamine patch placed behind your ear for the management of post- operative nausea and/or vomiting:  1. The medication in the patch is effective for 72 hours, after which it should be removed.  Wrap patch in a tissue and discard in the trash. Wash hands thoroughly with soap and water. 2. You may remove the patch earlier than 72 hours if you experience unpleasant side effects which may include dry mouth, dizziness or visual disturbances. 3. Avoid touching the patch. Wash your hands with soap and water after contact with the patch.    Regional Anesthesia Blocks  1. Numbness or the inability to move the "blocked" extremity may last from 3-48 hours after placement. The length of time depends on the medication injected and your individual response to the medication. If the numbness is not going away after 48 hours, call your surgeon.  2. The extremity that is blocked will need to be protected until the numbness is gone and the  Strength has returned. Because you cannot feel it, you will need to take extra care to avoid injury. Because it may be weak, you may have difficulty moving it or using it. You may not know what position it is in without looking at it while the block is in effect.  3. For blocks in the legs and feet, returning to weight bearing and walking needs to be done carefully. You will need to wait until the numbness is entirely gone and the strength has returned. You should be able to move your leg  and foot normally before you try and bear weight or walk. You will need someone to be with you when you first try to ensure you do not fall and possibly risk injury.  4. Bruising and tenderness at the needle site are common side effects and will resolve in a few days.  5. Persistent numbness or new problems with movement should be communicated to the surgeon or the [place] [phone] [place] [phone]).

## 2020-02-12 NOTE — Anesthesia Preprocedure Evaluation (Addendum)
Anesthesia Evaluation  Patient identified by MRN, date of birth, ID band Patient awake    Reviewed: Allergy & Precautions, NPO status , Patient's Chart, lab work & pertinent test results  Airway Mallampati: II  TM Distance: >3 FB Neck ROM: Full    Dental  (+) Missing, Dental Advisory Given,    Pulmonary sleep apnea , former smoker,    Pulmonary exam normal breath sounds clear to auscultation       Cardiovascular hypertension, Pt. on medications +CHF  Normal cardiovascular exam+ Valvular Problems/Murmurs (s/p TAVR 2019) AS  Rhythm:Regular Rate:Normal  TTE 2020 1. Left ventricular ejection fraction, by visual estimation, is 60 to 65%. The left ventricle has normal function. There is moderately increased left ventricular hypertrophy.  2. Elevated left ventricular end-diastolic pressure.  3. Left ventricular diastolic Doppler parameters are consistent with impaired relaxation pattern of LV diastolic filling.  4. Global right ventricle has normal systolic function.The right ventricular size is normal. No increase in right ventricular wall thickness.  5. Left atrial size was normal.  6. Right atrial size was normal.  7. Mild mitral annular calcification.  8. The mitral valve is abnormal. Trace mitral valve regurgitation. Mild mitral stenosis.  9. The tricuspid valve is grossly normal. Tricuspid valve regurgitation is mild.  10. Aortic valve mean gradient measures 9.8 mmHg.  11. Aortic valve peak gradient measures 18.1 mmHg.  12. Aortic valve regurgitation is trivial by color flow Doppler.  13. The pulmonic valve was grossly normal. Pulmonic valve regurgitation is not visualized by color flow Doppler.  14. Mildly elevated pulmonary artery systolic pressure.  15. MV peak gradient, 11.4 mmHg.  LHC 2019 Mid RCA lesion is 10% stenosed. Prox LAD to Mid LAD lesion is 10% stenosed. 1. Mild non-obstructive CAD 2. Severe aortic  stenosis (peak to peak gradient 62 mmHg, mean gradient 51.6 mmHg, AVA 0.82 cm2)  Recommendations: Will continue workup for TAVR.     Neuro/Psych  Headaches, PSYCHIATRIC DISORDERS Anxiety Depression    GI/Hepatic negative GI ROS, Neg liver ROS,   Endo/Other  diabetes, Type 2, Oral Hypoglycemic AgentsMorbid obesity (BMI 49)  Renal/GU negative Renal ROS  negative genitourinary   Musculoskeletal  (+) Arthritis , Fibromyalgia -  Abdominal   Peds  Hematology negative hematology ROS (+)   Anesthesia Other Findings right foot osteomyelitis  Reproductive/Obstetrics                           Anesthesia Physical Anesthesia Plan  ASA: III  Anesthesia Plan: MAC and Regional   Post-op Pain Management:  Regional for Post-op pain   Induction: Intravenous  PONV Risk Score and Plan: 2 and Propofol infusion, Treatment may vary due to age or medical condition, Ondansetron and Midazolam  Airway Management Planned: Natural Airway  Additional Equipment:   Intra-op Plan:   Post-operative Plan:   Informed Consent: I have reviewed the patients History and Physical, chart, labs and discussed the procedure including the risks, benefits and alternatives for the proposed anesthesia with the patient or authorized representative who has indicated his/her understanding and acceptance.     Dental advisory given  Plan Discussed with: CRNA  Anesthesia Plan Comments:         Anesthesia Quick Evaluation

## 2020-02-12 NOTE — H&P (Signed)
[name] is an 79 y.o. female.   Chief Complaint: Right foot osteomyelitis HPI: The patient is a 79 year old female with a past medical history significant for diabetes.  She has a history of swelling and erythema of the right forefoot that started at the second toe and spread to her big toe.  Radiographs reveal osteolysis of the distal phalanx of the right hallux adjacent to the ulcer.  She presents now for right hallux amputation.  She is also consented for possible second toe amputation.  She has been off of antibiotics for 2 days.  Past Medical History:  Diagnosis Date  . Abnormal thyroid biopsy 2018   results were negative.  . Anemia    "when I was alot younger" (12/06/2017)  . Anxiety    self reported  . Arthritis    "almost all over; used to cry w/it when I was in my teens" (12/06/2017)  . CHF (congestive heart failure) (HCC)   . Chronic back pain    "all over" (12/06/2017)  . Depression    "lost my son last year to cancer; I tended to him; he lived w/me" (12/06/2017)  . Diverticulosis of colon (without mention of hemorrhage)   . External hemorrhoids without mention of complication   . Fatty liver disease, nonalcoholic   . Fibromyalgia   . History of kidney stones   . History of stomach ulcers 1970  . Hyperlipemia   . Hypertension   . IBS (irritable bowel syndrome)   . Morbid obesity (HCC)   . Psoriasis   . 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 Severe aortic stenosis   . Sinus headache   . Sleep apnea    "was told I do; never have had any problems w/it" (12/06/2017)  . Type II diabetes mellitus (HCC)     Past Surgical History:  Procedure Laterality Date  . ABDOMINAL ADHESION SURGERY     "took 2 out"  . ABDOMINAL HYSTERECTOMY    . APPENDECTOMY    . BACK SURGERY    . BIOPSY THYROID  2018   results were negative.  Marland Kitchen BREAST  LUMPECTOMY Left X 1   "benign"  . CATARACT EXTRACTION W/ INTRAOCULAR LENS  IMPLANT, BILATERAL Bilateral   . CATARACT EXTRACTION W/ INTRAOCULAR LENS IMPLANT Bilateral 2017  . EYE SURGERY    . HEMORRHOID BANDING    . LAPAROSCOPIC CHOLECYSTECTOMY    . LEFT AND RIGHT HEART CATHETERIZATION WITH CORONARY ANGIOGRAM N/A 11/15/2013   Procedure: LEFT AND RIGHT HEART CATHETERIZATION WITH CORONARY ANGIOGRAM;  Surgeon: Micheline Chapman, MD;  Location: [facility] CATH LAB;  Service: Cardiovascular;  Laterality: N/A;  . LUMBAR DISC SURGERY    . RETINAL DETACHMENT SURGERY Right   . RIGHT/LEFT HEART CATH AND CORONARY ANGIOGRAPHY N/A 11/02/2017   Procedure: RIGHT/LEFT HEART CATH AND CORONARY ANGIOGRAPHY;  Surgeon: Kathleene Hazel, MD;  Location: [facility] INVASIVE CV LAB;  Service: Cardiovascular;  Laterality: N/A;  . TEE WITHOUT CARDIOVERSION N/A 12/06/2017   Procedure: TRANSESOPHAGEAL ECHOCARDIOGRAM (TEE);  Surgeon: Tonny Bollman, MD;  Location: [facility] OR;  Service: Open Heart Surgery;  Laterality: N/A;  . TONSILLECTOMY    . TRANSCATHETER AORTIC VALVE REPLACEMENT, TRANSFEMORAL  12/06/2017  . TRANSCATHETER AORTIC VALVE REPLACEMENT, TRANSFEMORAL N/A 12/06/2017   Procedure: TRANSCATHETER AORTIC VALVE REPLACEMENT, TRANSFEMORAL;  Surgeon: Tonny Bollman, MD;  Location: [facility] OR;  Service: Open Heart Surgery;  Laterality: N/A;  .  TRANSESOPHAGEAL ECHOCARDIOGRAM  12/06/2017    Family History  Problem Relation Age of Onset  . Lung cancer Mother   . Diabetes Maternal Grandmother   . Heart disease Maternal Grandfather   . Colon cancer Neg Hx    Social History:  reports that she quit smoking about 19 years ago. Her smoking use included cigarettes. She has a 56.00 pack-year smoking history. She has never used smokeless tobacco. She reports that she does not drink alcohol and does not use drugs.  Allergies:  Allergies  Allergen Reactions  . Sulfonamide Derivatives Anaphylaxis  . Castor Oil     "passed out" ? SYNCOPE ?  Marland Kitchen  Amoxicillin Diarrhea and Other (See Comments)    Has patient had a PCN reaction causing immediate rash, facial/tongue/throat swelling, SOB or lightheadedness with hypotension: No Has patient had a PCN reaction causing severe rash involving mucus membranes or skin necrosis: No Has patient had a PCN reaction that required hospitalization: No Has patient had a PCN reaction occurring within the last 10 years: No If all of the above answers are "NO", then may proceed with Cephalosporin use.   . Demerol [Meperidine] Nausea And Vomiting  . Hydrocodone Nausea And Vomiting    Medications Prior to Admission  Medication Sig Dispense Refill  . aspirin EC 81 MG tablet Take 1 tablet (81 mg total) by mouth daily. 1 tablet 0  . atorvastatin (LIPITOR) 40 MG tablet Take 40 mg by mouth daily.    . Cholecalciferol (VITAMIN D-3) 5000 units TABS Take 5,000 Units by mouth daily.    . Cranberry 250 MG TABS Take 250 mg by mouth daily.    Marland Kitchen doxycycline (VIBRA-TABS) 100 MG tablet Take 100 mg by mouth 2 (two) times daily.    . enalapril (VASOTEC) 10 MG tablet Take 10 mg by mouth daily.    . furosemide (LASIX) 80 MG tablet Take 1 tablet (80 mg total) by mouth daily. (Patient taking differently: Take 80 mg by mouth 2 (two) times daily.) 90 tablet 3  . glipiZIDE (GLUCOTROL XL) 10 MG 24 hr tablet Take 10 mg by mouth daily.    . metFORMIN (GLUMETZA) 1000 MG (MOD) 24 hr tablet Take 1,000 mg by mouth 2 (two) times daily with a meal.    . potassium chloride SA (K-DUR,KLOR-CON) 20 MEQ tablet Take 2 tablets (40 mEq total) by mouth daily. (Patient taking differently: Take 40 mEq by mouth 2 (two) times daily.) 180 tablet 3  . traMADol (ULTRAM) 50 MG tablet Take 50 mg by mouth every 6 (six) hours as needed for moderate pain.     Marland Kitchen amoxicillin (AMOXIL) 500 MG tablet TAKE 4 TABLETS BY MOUTH ONE HOUR PRIOR TO DENTAL VISITS. (Patient taking differently: Take 2,000 mg by mouth once.) 8 tablet 6  . cyclobenzaprine (FLEXERIL) 10 MG tablet  Take 10 mg by mouth 3 (three) times daily as needed for muscle spasms.      Results for orders placed or performed during the hospital encounter of 02/12/20 (from the past 48 hour(s))  Glucose, capillary     Status: Abnormal   Collection Time: 02/12/20 10:49 AM  Result Value Ref Range   Glucose-Capillary 143 (H) 70 - 99 mg/dL    Comment: Glucose reference range applies only to samples taken after fasting for at least 8 hours.  Basic metabolic panel per protocol     Status: Abnormal   Collection Time: 02/12/20 11:22 AM  Result Value Ref Range   Sodium 140 135 - 145  mmol/L   Potassium 3.1 (L) 3.5 - 5.1 mmol/L   Chloride 103 98 - 111 mmol/L   CO2 26 22 - 32 mmol/L   Glucose, Bld 148 (H) 70 - 99 mg/dL    Comment: Glucose reference range applies only to samples taken after fasting for at least 8 hours.   BUN 17 8 - 23 mg/dL   Creatinine, Ser 1.00 0.44 - 1.00 mg/dL   Calcium 8.7 (L) 8.9 - 10.3 mg/dL   GFR, Estimated 58 (L) >60 mL/min    Comment: (NOTE) Calculated using the CKD-EPI Creatinine Equation (2021)    Anion gap 11 5 - 15    Comment: Performed at [facility] [address], [place], [place] [address]   No results found.  Review of Systems  Blood pressure (!) 128/57, pulse 61, temperature 97.8 F (36.6 C), temperature source Oral, resp. rate 14, height 5' 1.5" (1.562 m), weight 120.6 kg, SpO2 100 %. Physical Exam  well-nourished well-developed overweight woman in no apparent distress.  Alert and oriented x4.  Normal mood and affect.  Gait is normal.  The right hallux is swollen along with the second toe.  There is an ulcer at the tip of both toes.  Brisk capillary refill to the toes.  Diminished sensibility to light touch at the forefoot.  5 out of 5 strength in plantarflexion and dorsiflexion of the ankle and toes.  There is some swelling and erythema around the forefoot.  No lymphadenopathy.  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  The risks and benefits of the alternative treatment options have been discussed in detail.  The patient wishes to proceed with surgery and specifically understands risks of bleeding, infection, nerve damage, blood clots, need for additional surgery, amputation and death.   Wylene Simmer, MD February 15, 2020, 3:23 PM

## 2020-02-12 NOTE — Anesthesia Procedure Notes (Signed)
Anesthesia Regional Block: Adductor canal block   Pre-Anesthetic Checklist: ,, timeout performed, Correct Patient, Correct Site, Correct Laterality, Correct Procedure, Correct Position, site marked, Risks and benefits discussed,  Surgical consent,  Pre-op evaluation,  At surgeon's request and post-op pain management  Laterality: Right  Prep: Maximum Sterile Barrier Precautions used, chloraprep       Needles:  Injection technique: Single-shot  Needle Type: Echogenic Stimulator Needle     Needle Length: 9cm  Needle Gauge: 22     Additional Needles:   Procedures:,,,, ultrasound used (permanent image in chart),,,,  Narrative:  Start time: 02/12/2020 2:30 PM End time: 02/12/2020 2:33 PM Injection made incrementally with aspirations every 5 mL.  Performed by: Personally  Anesthesiologist: Freddrick March, MD  Additional Notes: Monitors applied. No increased pain on injection. No increased resistance to injection. Injection made in 5cc increments. Good needle visualization. Patient tolerated procedure well.

## 2020-02-12 NOTE — Op Note (Signed)
02/12/2020  4:16 PM  PATIENT:  [name]  79 y.o. female  PRE-OPERATIVE DIAGNOSIS: 1.  Right hallux and 2nd toe diabetic ulcers      2.  Right hallux ostoemyelitis  POST-OPERATIVE DIAGNOSIS:  Same  Procedure(s):  1.  Right 1st ray amputation   2.  Right 2nd ray amputation  SURGEON:  Wylene Simmer, MD  ASSISTANT: Mechele Claude, PA-C  ANESTHESIA:   MAC, regional  EBL:  minimal   TOURNIQUET:   Total Tourniquet Time Documented: Calf (Right) - 15 minutes Total: Calf (Right) - 15 minutes  COMPLICATIONS:  None apparent  DISPOSITION:  Extubated, awake and stable to recovery.  INDICATION FOR PROCEDURE:  The patient is a 79 y/o female with PMH of diabetes who has a several month h/o right forefoot swelling after developing an ulcer at the tip of the 2nd toe.  The 2nd toe initially swelling followed by the hallux.  She was treated with oral abx by her PCP but did not improve.  Xrays reveal destruction of the right hallux distal phalanx and questionable lucency at the distal phalanx of the 2nd toe.  The ulcer at the 2nd toe has not healed despite wound care and abx.  She presents today for right hallux amputation and amputation of the 2nd toe.  The risks and benefits of the alternative treatment options have been discussed in detail.  The patient wishes to proceed with surgery and specifically understands risks of bleeding, infection, nerve damage, blood clots, need for additional surgery, amputation and death.  PROCEDURE IN DETAIL:  After pre operative consent was obtained, and the correct operative site was identified, the patient was brought to the operating room and placed supine on the OR table.  Anesthesia was administered.  Pre-operative antibiotics were administered.  A surgical timeout was taken.  The right lower extremity was prepped and draped in sterile fashion.  The foot was exsanguinated and a 4" esmarch wrapped around the ankle as a tourniquet.  An incision was then made around  the base of the first and second toes.  DIssection was carried down to the proximal phalanges.  Both toes were disarticulated through the MPJs and passed off the field.  The remaining skin would not close, so the distal metatarsals were resected with the oscillating saw.  The neurovascular bundles were cauterized.  The wound was irrigated and sprinkled with vancmycin powder.  The wound was closed with 2-0 nylon sutures.  Sterile dressings were applied followed by a compression wrap.  The tourniquet was released.  The patient was awakened from anesthesia and transferred to the recovery room in stable condition.   FOLLOW UP PLAN:  wbat in a flat post op shoe.  F/u in the offfice in 2 weeks for suture removal.  Keflex for two weeks post op.    Mechele Claude PA-C was present and scrubbed for the duration of the operative case. His assistance was essential in positioning the patient, prepping and draping, gaining and maintaining exposure, performing the operation, closing and dressing the wounds and applying the splint.

## 2020-02-13 ENCOUNTER — Encounter (HOSPITAL_BASED_OUTPATIENT_CLINIC_OR_DEPARTMENT_OTHER): Payer: Self-pay | Admitting: Orthopedic Surgery

## 2020-02-13 NOTE — Addendum Note (Signed)
Addendum  created 02/13/20 1327 by Maryella Shivers, CRNA   Charge Capture section accepted

## 2020-02-13 NOTE — Addendum Note (Signed)
Addendum  created 02/13/20 1322 by Maryella Shivers, CRNA   Charge Capture section accepted

## 2020-02-14 ENCOUNTER — Encounter (HOSPITAL_BASED_OUTPATIENT_CLINIC_OR_DEPARTMENT_OTHER): Payer: Self-pay | Admitting: Orthopedic Surgery

## 2020-02-19 ENCOUNTER — Encounter (HOSPITAL_BASED_OUTPATIENT_CLINIC_OR_DEPARTMENT_OTHER): Payer: Medicare Other | Admitting: Internal Medicine

## 2020-03-03 ENCOUNTER — Ambulatory Visit: Payer: Medicare Other | Admitting: Cardiovascular Disease

## 2020-03-19 IMAGING — CT CT HEART MORP W/ CTA COR W/ SCORE W/ CA W/CM &/OR W/O CM
2 of 6 series · 12 of 20 positions shown, 14 images · IV contrast (APPLIED)
Comparison: None.

CLINICAL DATA: Aortic stenosis

EXAM:
Cardiac TAVR CT
TECHNIQUE: The patient was scanned on a Siemens Force [REDACTED]ice scanner. A 120
kV retrospective scan was triggered in the descending thoracic aorta
at 111 HU's. Gantry rotation speed was 270 msecs and collimation was
.9 mm. No beta blockade or nitro were given. The 3D data set was
reconstructed in 5% intervals of the R-R cycle. Systolic and
diastolic phases were analyzed on a dedicated work station using
MPR, MIP and VRT modes. The patient received 80 cc of contrast.

[Series 5: 0-90% · axial · 0.39mm/px · z∈[-164,+15]mm · 6 of 4170 slices shown, 8 images]
[im 596/4170  vessel]
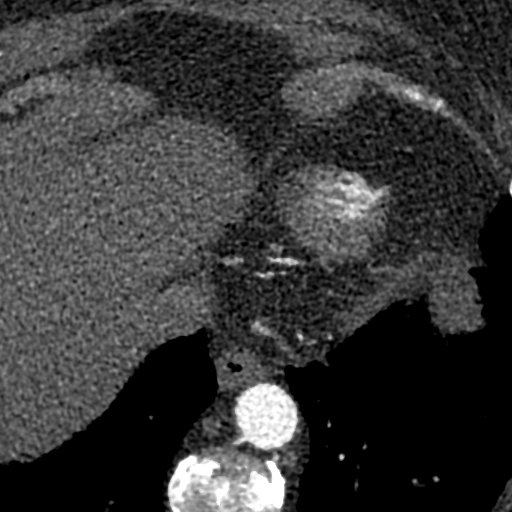
[im 596/4170  lung]
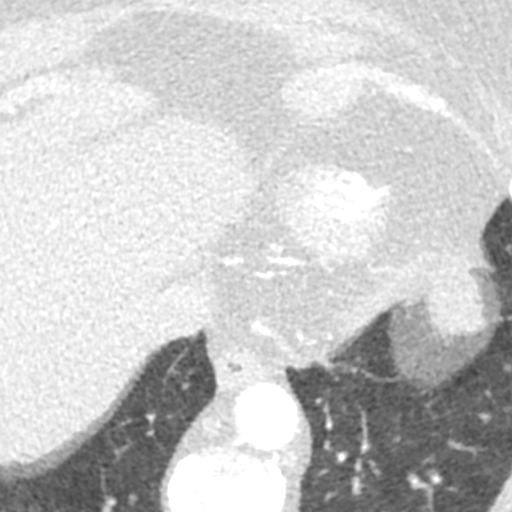
[im 1192/4170  vessel]
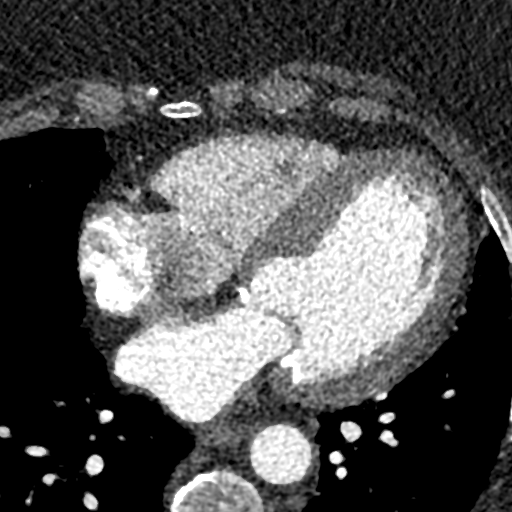
[im 1787/4170  vessel]
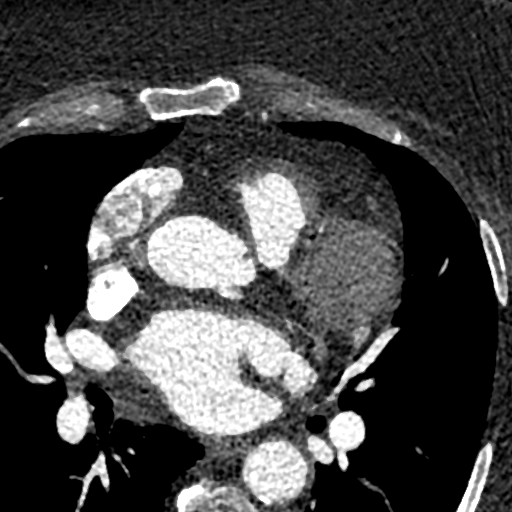
[im 2383/4170  vessel]
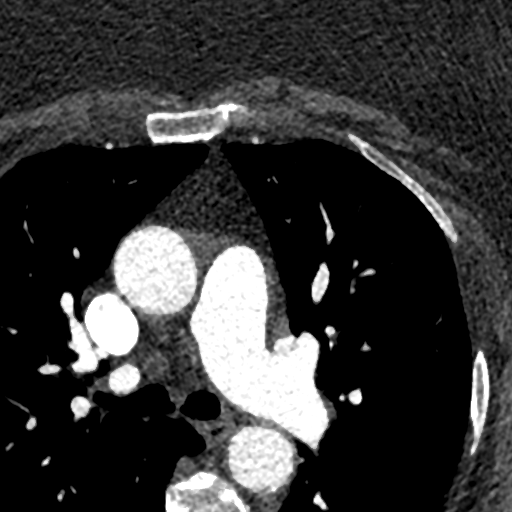
[im 2978/4170  vessel]
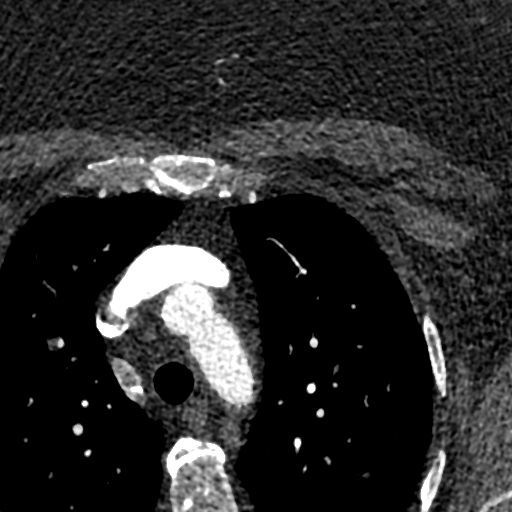
[im 2978/4170  lung]
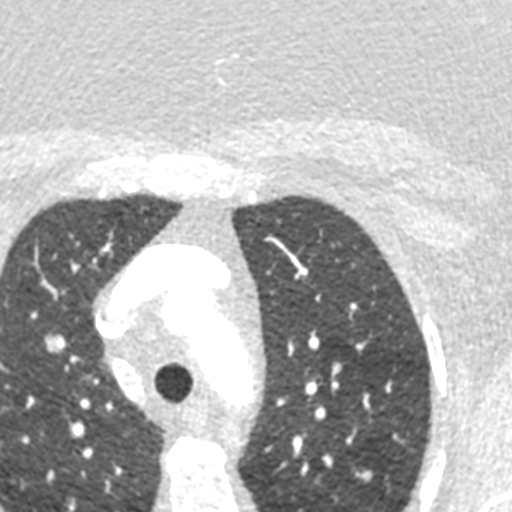
[im 3574/4170  vessel]
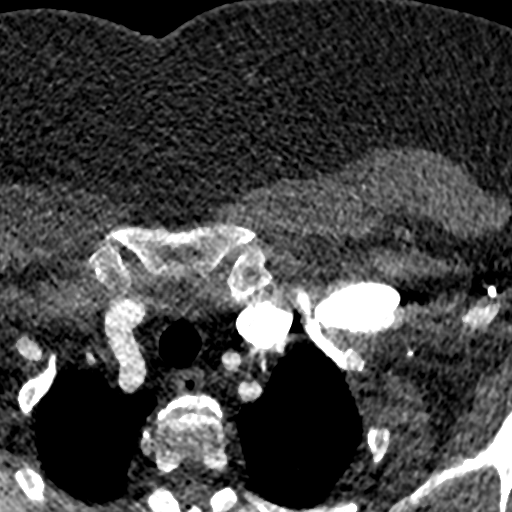

[Series 6: 5-95% · axial · 0.39mm/px · z∈[-164,+15]mm · 6 of 4170 slices shown]
[im 596/4170  vessel]
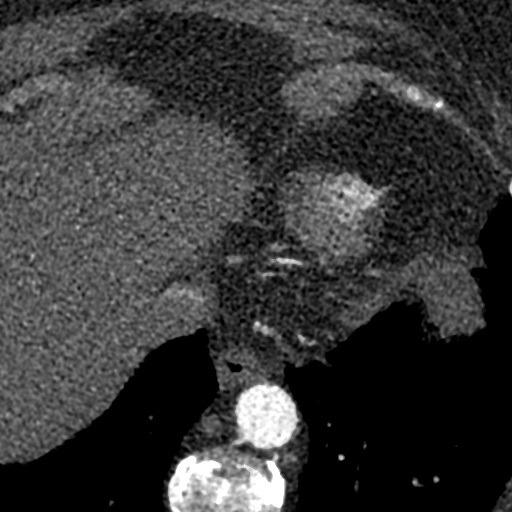
[im 1192/4170  vessel]
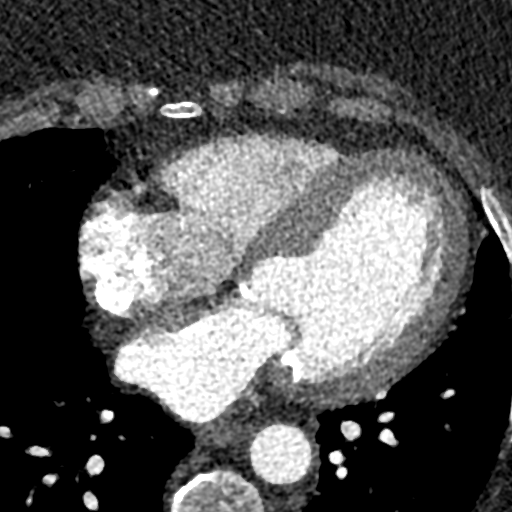
[im 1787/4170  vessel]
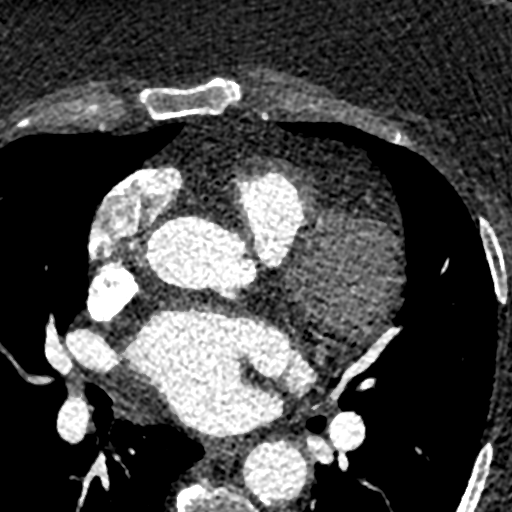
[im 2383/4170  vessel]
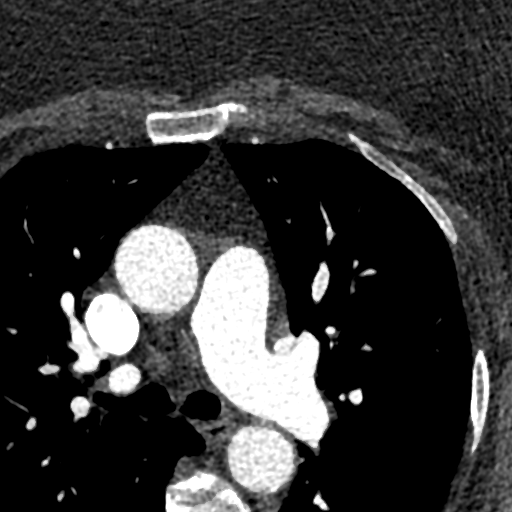
[im 2978/4170  vessel]
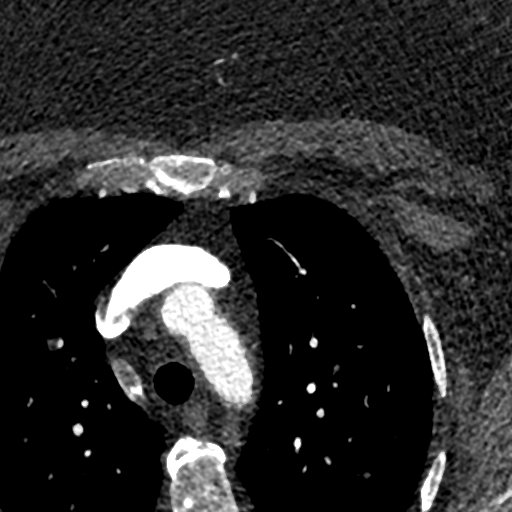
[im 3574/4170  vessel]
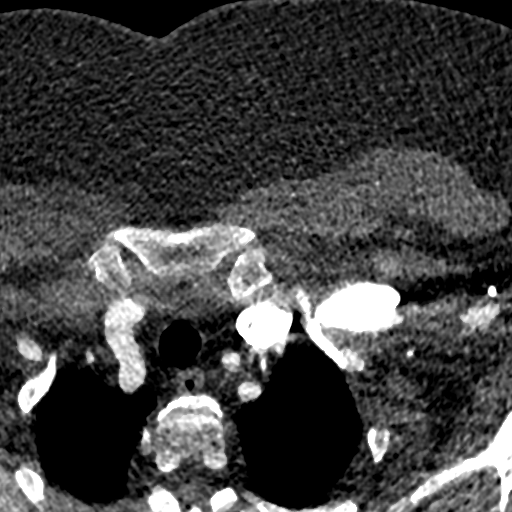

[12 of 20 positions shown; findings below may reference images not displayed]

FINDINGS: Aortic Valve: Tri leaflet and calcified with restricted leaflet
motion

Aorta: No aneurysm moderate calcific atherosclerosis

Sinotubular Junction: 26 mm

Ascending Thoracic Aorta: 32 mm

Aortic Arch: 24 mm

Descending Thoracic Aorta: 23 mm

Sinus of Valsalva Measurements:

Non-coronary: 28 mm

Right - coronary: 26 mm

Left - coronary: 25.7 mm

Coronary Artery Height above Annulus:

Left Main: 13 mm above annulus

Right Coronary: 13.7 mm above annulus

Virtual Basal Annulus Measurements:

Maximum/Minimum Diameter: 24.4 mm x 19.9 mm

Perimeter: 70.5 mm

Area: 383 mm2

Coronary Arteries: Sufficient height above annulus for deployment

Optimum Fluoroscopic Angle for Delivery: LAO 23 degrees Caudal 1
degree
IMPRESSION: 1. Calcified tri leaflet AV with annular area of 383 mm 2 suitable
for a 23 mm Sapien 3 valve

2.  Coronary arteries sufficient height above annulus for deployment

3.  No MOOLMAN thrombus

4. Optimum angiographic angle for deployment LAO 23 degrees Caudal 1
degree

Halie Iii

EXAM:
OVER-READ INTERPRETATION  CT CHEST

The following report is an over-read performed by radiologist Dr.
Pettinger Schwinden [REDACTED] on 11/14/2017. This
over-read does not include interpretation of cardiac or coronary
anatomy or pathology. The coronary CTA interpretation by the
cardiologist is attached.
FINDINGS: Extracardiac findings will be described separately under dictation
for contemporaneously obtained CTA chest, abdomen and pelvis.
IMPRESSION: Please see separate dictation for contemporaneously obtained CTA
chest, abdomen and pelvis 11/14/2017 for full description of
relevant extracardiac findings.

## 2020-05-01 ENCOUNTER — Other Ambulatory Visit: Payer: Self-pay | Admitting: Gastroenterology

## 2020-07-29 ENCOUNTER — Other Ambulatory Visit: Payer: Self-pay | Admitting: Gastroenterology

## 2020-08-01 ENCOUNTER — Other Ambulatory Visit (HOSPITAL_COMMUNITY)
Admission: RE | Admit: 2020-08-01 | Discharge: 2020-08-01 | Disposition: A | Discharge status: Home / Self Care | Payer: Medicare Other | Source: Ambulatory Visit | Attending: Gastroenterology | Admitting: Gastroenterology

## 2020-08-01 DIAGNOSIS — Z20822 Contact with and (suspected) exposure to covid-19: Secondary | ICD-10-CM | POA: Insufficient documentation

## 2020-08-01 DIAGNOSIS — Z01812 Encounter for preprocedural laboratory examination: Secondary | ICD-10-CM | POA: Insufficient documentation

## 2020-08-01 LAB — SARS CORONAVIRUS 2 (TAT 6-24 HRS): SARS Coronavirus 2: NEGATIVE

## 2020-08-01 NOTE — Progress Notes (Signed)
Attempted to obtain medical history via telephone, unable to reach at this time. I left a voicemail to return pre surgical testing department's phone call.  

## 2020-08-04 NOTE — Anesthesia Preprocedure Evaluation (Addendum)
Anesthesia Evaluation  Patient identified by MRN, date of birth, ID band Patient awake    Reviewed: NPO status , Patient's Chart, lab work & pertinent test results  Airway Mallampati: II  TM Distance: >3 FB Neck ROM: Full    Dental no notable dental hx. (+) Dental Advisory Given, Partial Upper,    Pulmonary sleep apnea , former smoker,    Pulmonary exam normal breath sounds clear to auscultation       Cardiovascular hypertension, Normal cardiovascular exam Rhythm:Regular Rate:Normal  10/20 Echo 1. Left ventricular ejection fraction, by visual estimation, is 60 to  65%. The left ventricle has normal function. There is moderately increased  left ventricular hypertrophy.  2. Elevated left ventricular end-diastolic pressure.  3. Left ventricular diastolic Doppler parameters are consistent with  impaired relaxation pattern of LV diastolic filling.  4. Global right ventricle has normal systolic function.The right  ventricular size is normal. No increase in right ventricular wall  thickness.    Neuro/Psych Anxiety    GI/Hepatic   Endo/Other  diabetes, Type 2Morbid obesity  Renal/GU      Musculoskeletal  (+) Arthritis , Fibromyalgia -  Abdominal (+) + obese,   Peds  Hematology   Anesthesia Other Findings All: sulfa and amoxicillin  Reproductive/Obstetrics                           Anesthesia Physical Anesthesia Plan  ASA: 3  Anesthesia Plan: MAC   Post-op Pain Management:    Induction:   PONV Risk Score and Plan: Treatment may vary due to age or medical condition  Airway Management Planned: Natural Airway  Additional Equipment:   Intra-op Plan:   Post-operative Plan:   Informed Consent: I have reviewed the patients History and Physical, chart, labs and discussed the procedure including the risks, benefits and alternatives for the proposed anesthesia with the patient or authorized  representative who has indicated his/her understanding and acceptance.     Dental advisory given  Plan Discussed with: CRNA  Anesthesia Plan Comments: (Hx of colon polyps and dysphagia fir EGD colon)       Anesthesia Quick Evaluation

## 2020-08-05 ENCOUNTER — Ambulatory Visit (HOSPITAL_COMMUNITY): Payer: Medicare Other | Admitting: Anesthesiology

## 2020-08-05 ENCOUNTER — Encounter (HOSPITAL_COMMUNITY)
Admission: RE | Disposition: A | Discharge status: Home / Self Care | Payer: Self-pay | Source: Home / Self Care | Attending: Gastroenterology

## 2020-08-05 ENCOUNTER — Encounter (HOSPITAL_COMMUNITY): Payer: Self-pay | Admitting: Gastroenterology

## 2020-08-05 ENCOUNTER — Ambulatory Visit (HOSPITAL_COMMUNITY)
Admission: RE | Admit: 2020-08-05 | Discharge: 2020-08-05 | Disposition: A | Discharge status: Home / Self Care | Payer: Medicare Other | Attending: Gastroenterology | Admitting: Gastroenterology

## 2020-08-05 DIAGNOSIS — Z86010 Personal history of colonic polyps: Secondary | ICD-10-CM | POA: Diagnosis not present

## 2020-08-05 DIAGNOSIS — K648 Other hemorrhoids: Secondary | ICD-10-CM | POA: Diagnosis not present

## 2020-08-05 DIAGNOSIS — Z7984 Long term (current) use of oral hypoglycemic drugs: Secondary | ICD-10-CM | POA: Diagnosis not present

## 2020-08-05 DIAGNOSIS — K219 Gastro-esophageal reflux disease without esophagitis: Secondary | ICD-10-CM | POA: Diagnosis not present

## 2020-08-05 DIAGNOSIS — K2900 Acute gastritis without bleeding: Secondary | ICD-10-CM | POA: Diagnosis not present

## 2020-08-05 DIAGNOSIS — Z85828 Personal history of other malignant neoplasm of skin: Secondary | ICD-10-CM | POA: Insufficient documentation

## 2020-08-05 DIAGNOSIS — Z79899 Other long term (current) drug therapy: Secondary | ICD-10-CM | POA: Diagnosis not present

## 2020-08-05 DIAGNOSIS — R1310 Dysphagia, unspecified: Secondary | ICD-10-CM | POA: Insufficient documentation

## 2020-08-05 DIAGNOSIS — Z87891 Personal history of nicotine dependence: Secondary | ICD-10-CM | POA: Diagnosis not present

## 2020-08-05 DIAGNOSIS — Z1211 Encounter for screening for malignant neoplasm of colon: Secondary | ICD-10-CM | POA: Insufficient documentation

## 2020-08-05 HISTORY — PX: COLONOSCOPY WITH PROPOFOL: SHX5780

## 2020-08-05 HISTORY — PX: BIOPSY: SHX5522

## 2020-08-05 HISTORY — PX: ESOPHAGOGASTRODUODENOSCOPY (EGD) WITH PROPOFOL: SHX5813

## 2020-08-05 HISTORY — PX: POLYPECTOMY: SHX5525

## 2020-08-05 LAB — GLUCOSE, CAPILLARY: Glucose-Capillary: 125 mg/dL — ABNORMAL HIGH (ref 70–99)

## 2020-08-05 SURGERY — COLONOSCOPY WITH PROPOFOL
Anesthesia: Monitor Anesthesia Care

## 2020-08-05 MED ORDER — SODIUM CHLORIDE 0.9 % IV SOLN: INTRAVENOUS | Status: DC

## 2020-08-05 MED ORDER — DEXMEDETOMIDINE (PRECEDEX) IN NS 20 MCG/5ML (4 MCG/ML) IV SYRINGE
PREFILLED_SYRINGE | INTRAVENOUS | Status: DC | PRN
  Administered 2020-08-05 (×4): 4 ug via INTRAVENOUS

## 2020-08-05 MED ORDER — PHENYLEPHRINE 40 MCG/ML (10ML) SYRINGE FOR IV PUSH (FOR BLOOD PRESSURE SUPPORT)
PREFILLED_SYRINGE | INTRAVENOUS | Status: DC | PRN
  Administered 2020-08-05 (×3): 80 ug via INTRAVENOUS

## 2020-08-05 MED ORDER — EPHEDRINE SULFATE-NACL 50-0.9 MG/10ML-% IV SOSY
PREFILLED_SYRINGE | INTRAVENOUS | Status: DC | PRN
  Administered 2020-08-05: 10 mg via INTRAVENOUS
  Administered 2020-08-05: 5 mg via INTRAVENOUS
  Administered 2020-08-05: 10 mg via INTRAVENOUS

## 2020-08-05 MED ORDER — PROPOFOL 500 MG/50ML IV EMUL
INTRAVENOUS | Status: DC | PRN
  Administered 2020-08-05: 125 ug/kg/min via INTRAVENOUS

## 2020-08-05 MED ORDER — PROPOFOL 10 MG/ML IV BOLUS
INTRAVENOUS | Status: DC | PRN
  Administered 2020-08-05: 20 mg via INTRAVENOUS

## 2020-08-05 MED ORDER — LACTATED RINGERS IV SOLN: INTRAVENOUS | Status: DC | PRN

## 2020-08-05 MED ORDER — LIDOCAINE HCL URETHRAL/MUCOSAL 2 % EX GEL
CUTANEOUS | Status: DC | PRN
  Administered 2020-08-05: 1 via TOPICAL

## 2020-08-05 MED ORDER — LIDOCAINE VISCOUS HCL 2 % MT SOLN
OROMUCOSAL | Status: AC
  Filled 2020-08-05: qty 15

## 2020-08-05 SURGICAL SUPPLY — 25 items

## 2020-08-05 NOTE — Op Note (Signed)
[facility] Patient Name: [name] Procedure Date: 08/05/2020 MRN: [id number] Attending MD: Lear Ng , MD Date of Birth: [birth year] CSN: [id number] Age: 80 Admit Type: Outpatient Procedure:                Upper GI endoscopy Indications:              Dysphagia, Esophageal reflux Providers:                Lear Ng, MD, Dulcy Fanny, Cletis Athens, Technician Referring MD:             Leonard Downing Medicines:                Propofol per Anesthesia, Monitored Anesthesia Care Complications:            No immediate complications. Estimated Blood Loss:     Estimated blood loss was minimal. Procedure:                Pre-Anesthesia Assessment:                           - Prior to the procedure, a History and Physical                            was performed, and patient medications and                            allergies were reviewed. The patient's tolerance of                            previous anesthesia was also reviewed. The risks                            and benefits of the procedure and the sedation                            options and risks were discussed with the patient.                            All questions were answered, and informed consent                            was obtained. Prior Anticoagulants: The patient has                            taken no previous anticoagulant or antiplatelet                            agents. ASA Grade Assessment: III - A patient with                            severe systemic disease. After reviewing the risks  and benefits, the patient was deemed in                            satisfactory condition to undergo the procedure.                           After obtaining informed consent, the endoscope was                            passed under direct vision. Throughout the                            procedure, the patient's blood pressure,  pulse, and                            oxygen saturations were monitored continuously. The                            GIF-H190 ([id number]) was introduced through the                            mouth, and advanced to the second part of duodenum.                            The upper GI endoscopy was accomplished without                            difficulty. The patient tolerated the procedure                            well. Scope In: Scope Out: Findings:      The examined esophagus was normal.      The Z-line was regular and was found 44 cm from the incisors.      Segmental moderate inflammation characterized by congestion (edema),       erosions, erythema, linear erosions and serpentine ulcerations was found       in the gastric antrum. Biopsies were taken with a cold forceps for       histology. Estimated blood loss was minimal.      The cardia and gastric fundus were normal on retroflexion.      Patchy mild mucosal changes characterized by congestion, erythema and       ulceration were found in the duodenal bulb.      The exam of the duodenum was otherwise normal. Impression:               - Normal esophagus.                           - Z-line regular, 44 cm from the incisors.                           - Acute gastritis. Biopsied.                           - Mucosal changes in the duodenum. Moderate Sedation:  Not Applicable - Patient had care per Anesthesia. Recommendation:           - Await pathology results.                           - Patient has a contact number available for                            emergencies. The signs and symptoms of potential                            delayed complications were discussed with the                            patient. Return to normal activities tomorrow.                            Written discharge instructions were provided to the                            patient.                           - Follow an antireflux regimen. Procedure  Code(s):        --- Professional ---                           [phone], Esophagogastroduodenoscopy, flexible,                            transoral; with biopsy, single or multiple Diagnosis Code(s):        --- Professional ---                           K21.9, Gastro-esophageal reflux disease without                            esophagitis                           R13.10, Dysphagia, unspecified                           K29.00, Acute gastritis without bleeding                           K31.89, Other diseases of stomach and duodenum CPT copyright 2019 [employer]. All rights reserved. The codes documented in this report are preliminary and upon coder review may  be revised to meet current compliance requirements. Lear Ng, MD 08/05/2020 10:50:49 AM This report has been signed electronically. Number of Addenda: 0

## 2020-08-05 NOTE — Op Note (Signed)
[facility] Patient Name: [name] Procedure Date: 08/05/2020 MRN: [id number] Attending MD: Lear Ng , MD Date of Birth: [birth year] CSN: [id number] Age: 80 Admit Type: Outpatient Procedure:                Colonoscopy Indications:              High risk colon cancer surveillance: Personal                            history of colonic polyps, Last colonoscopy: July                            2016 Providers:                Lear Ng, MD, Dulcy Fanny, Cletis Athens, Technician Referring MD:             Leonard Downing Medicines:                Propofol per Anesthesia, Monitored Anesthesia Care Complications:            No immediate complications. Estimated Blood Loss:     Estimated blood loss: none. Procedure:                Pre-Anesthesia Assessment:                           - Prior to the procedure, a History and Physical                            was performed, and patient medications and                            allergies were reviewed. The patient's tolerance of                            previous anesthesia was also reviewed. The risks                            and benefits of the procedure and the sedation                            options and risks were discussed with the patient.                            All questions were answered, and informed consent                            was obtained. Prior Anticoagulants: The patient has                            taken no previous anticoagulant or antiplatelet                            agents.  ASA Grade Assessment: III - A patient with                            severe systemic disease. After reviewing the risks                            and benefits, the patient was deemed in                            satisfactory condition to undergo the procedure.                           After obtaining informed consent, the colonoscope                             was passed under direct vision. Throughout the                            procedure, the patient's blood pressure, pulse, and                            oxygen saturations were monitored continuously. The                            PCF-H190DL ([id number] Olympus pediatric colonscope                            was introduced through the anus and advanced to the                            the cecum, identified by appendiceal orifice and                            ileocecal valve. The colonoscopy was performed                            without difficulty. The patient tolerated the                            procedure well. The quality of the bowel                            preparation was fair. The ileocecal valve,                            appendiceal orifice, and rectum were photographed. Scope In: 10:11:12 AM Scope Out: 10:40:27 AM Scope Withdrawal Time: 0 hours 24 minutes 9 seconds  Total Procedure Duration: 0 hours 29 minutes 15 seconds  Findings:      The perianal and digital rectal examinations were normal.      Two semi-sessile polyps were found in the sigmoid colon. The polyps were       3 to 5 mm in size. These polyps were removed with a hot snare. Resection       and retrieval were  complete. Estimated blood loss: none.      Internal hemorrhoids were found during retroflexion. The hemorrhoids       were medium-sized and Grade I (internal hemorrhoids that do not       prolapse). Impression:               - Preparation of the colon was fair.                           - Two 3 to 5 mm polyps in the sigmoid colon,                            removed with a hot snare. Resected and retrieved.                           - Internal hemorrhoids. Moderate Sedation:      Not Applicable - Patient had care per Anesthesia. Recommendation:           - Patient has a contact number available for                            emergencies. The signs and symptoms of potential                             delayed complications were discussed with the                            patient. Return to normal activities tomorrow.                            Written discharge instructions were provided to the                            patient.                           - High fiber diet.                           - Await pathology results.                           - Repeat colonoscopy for surveillance based on                            pathology results.                           - No ibuprofen, naproxen, or other non-steroidal                            anti-inflammatory drugs for 2 weeks. Procedure Code(s):        --- Professional ---                           [phone], Colonoscopy, flexible; with removal of  tumor(s), polyp(s), or other lesion(s) by snare                            technique Diagnosis Code(s):        --- Professional ---                           Z86.010, Personal history of colonic polyps                           K63.5, Polyp of colon                           K64.0, First degree hemorrhoids CPT copyright 2019 American Medical Association. All rights reserved. The codes documented in this report are preliminary and upon coder review may  be revised to meet current compliance requirements. Lear Ng, MD 08/05/2020 10:57:54 AM This report has been signed electronically. Number of Addenda: 0

## 2020-08-05 NOTE — Anesthesia Postprocedure Evaluation (Signed)
Anesthesia Post Note  Patient: [name]  Procedure(s) Performed: COLONOSCOPY WITH PROPOFOL ESOPHAGOGASTRODUODENOSCOPY (EGD) WITH PROPOFOL BIOPSY POLYPECTOMY     Patient location during evaluation: Endoscopy Anesthesia Type: MAC Level of consciousness: awake and alert Pain management: pain level controlled Vital Signs Assessment: post-procedure vital signs reviewed and stable Respiratory status: spontaneous breathing, nonlabored ventilation, respiratory function stable and patient connected to nasal cannula oxygen Cardiovascular status: blood pressure returned to baseline and stable Postop Assessment: no apparent nausea or vomiting Anesthetic complications: no   No notable events documented.  Last Vitals:  Vitals:   08/05/20 1055 08/05/20 1113  BP: (!) 129/36 96/68  Pulse: 77 72  Resp: 20 20  Temp:    SpO2: 100% 97%    Last Pain:  Vitals:   08/05/20 1113  TempSrc:   PainSc: 0-No pain                 Barnet Glasgow

## 2020-08-05 NOTE — H&P (Signed)
Date of Initial H&P: 07/29/20  History reviewed, patient examined, no change in status, stable for surgery.

## 2020-08-05 NOTE — Transfer of Care (Signed)
Immediate Anesthesia Transfer of Care Note  Patient: [name]  Procedure(s) Performed: COLONOSCOPY WITH PROPOFOL ESOPHAGOGASTRODUODENOSCOPY (EGD) WITH PROPOFOL BIOPSY POLYPECTOMY  Patient Location: Endoscopy Unit  Anesthesia Type:MAC  Level of Consciousness: awake  Airway & Oxygen Therapy: Patient Spontanous Breathing  Post-op Assessment: Report given to RN and Post -op Vital signs reviewed and stable  Post vital signs: Reviewed and stable  Last Vitals:  Vitals Value Taken Time  BP 118/33 08/05/20 1046  Temp 36.6 C 08/05/20 1046  Pulse 84 08/05/20 1047  Resp 22 08/05/20 1047  SpO2 100 % 08/05/20 1047  Vitals shown include unvalidated device data.  Last Pain:  Vitals:   08/05/20 1046  TempSrc: Oral  PainSc: 0-No pain         Complications: No notable events documented.

## 2020-08-05 NOTE — Discharge Instructions (Signed)

## 2020-08-06 LAB — SURGICAL PATHOLOGY

## 2020-08-07 ENCOUNTER — Encounter (HOSPITAL_COMMUNITY): Payer: Self-pay | Admitting: Gastroenterology

## 2020-08-07 ENCOUNTER — Telehealth: Payer: Self-pay

## 2020-08-07 DIAGNOSIS — Z952 Presence of prosthetic heart valve: Secondary | ICD-10-CM

## 2020-08-07 NOTE — Telephone Encounter (Signed)
Per Dr. Burt Knack, scheduled the patient for 1 year echo and office visit 01/28/21. She was grateful for call and agrees with plan.

## 2020-09-11 ENCOUNTER — Other Ambulatory Visit: Payer: Self-pay | Admitting: Family Medicine

## 2020-09-11 ENCOUNTER — Ambulatory Visit
Admission: RE | Admit: 2020-09-11 | Discharge: 2020-09-11 | Disposition: A | Discharge status: Home / Self Care | Payer: Medicare Other | Source: Ambulatory Visit | Attending: Family Medicine | Admitting: Family Medicine

## 2020-09-11 DIAGNOSIS — R52 Pain, unspecified: Secondary | ICD-10-CM

## 2020-12-24 ENCOUNTER — Other Ambulatory Visit: Payer: Self-pay | Admitting: Physician Assistant

## 2020-12-24 NOTE — Telephone Encounter (Signed)
I just want to double check to make sure this medication is ok to refill?  I know its not cardiac but I also understand why the patient needs the med.

## 2021-01-28 ENCOUNTER — Ambulatory Visit: Payer: Medicare Other | Admitting: Cardiovascular Disease

## 2021-01-28 ENCOUNTER — Ambulatory Visit (HOSPITAL_COMMUNITY): Payer: Medicare Other | Attending: Cardiology

## 2021-01-28 ENCOUNTER — Encounter: Payer: Self-pay | Admitting: Cardiovascular Disease

## 2021-01-28 ENCOUNTER — Other Ambulatory Visit: Payer: Self-pay

## 2021-01-28 VITALS — BP 143/58 | HR 82 | Ht 61.5 in | Wt 262.6 lb

## 2021-01-28 DIAGNOSIS — Z952 Presence of prosthetic heart valve: Secondary | ICD-10-CM

## 2021-01-28 DIAGNOSIS — I10 Essential (primary) hypertension: Secondary | ICD-10-CM

## 2021-01-28 DIAGNOSIS — I5032 Chronic diastolic (congestive) heart failure: Secondary | ICD-10-CM | POA: Diagnosis not present

## 2021-01-28 LAB — ECHOCARDIOGRAM COMPLETE
AR max vel: 2.63 cm2
AV Area VTI: 2.89 cm2
AV Area mean vel: 2.67 cm2
AV Mean grad: 7 mmHg
AV Peak grad: 12.8 mmHg
Ao pk vel: 1.79 m/s
Area-P 1/2: 3.21 cm2
MV VTI: 2.68 cm2
P 1/2 time: 344 msec
S' Lateral: 3.8 cm

## 2021-01-28 NOTE — Progress Notes (Signed)
Cardiology Office Note:    Date:  01/28/2021   ID:  [name], DOB 07/20/1940, MRN [id number]  PCP:  Leonard Downing, MD   [facility] Cardiologist:  [name], MD     Referring MD: Leonard Downing, *   Chief Complaint  Patient presents with   Aortic Stenosis    History of Present Illness:    [name] is a 80 y.o. female with a hx of severe aortic stenosis who underwent TAVR December 06, 2017.  Comorbid medical problems include hypertension, mixed hyperlipidemia, obesity, fibromyalgia, type 2 diabetes, and chronic diastolic heart failure.  At the time of TAVR, she underwent valve replacement with a 26 mm Medtronic evolute valve via transfemoral approach.  The patient is here alone today.  She has been doing fairly well.  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  She does have shortness of breath with physical activity.  No orthopnea, PND, lightheadedness, heart palpitations, or syncope.  She had an episode of discomfort in the upper chest a few weeks ago that occurred at rest.  The total duration of symptoms was about 1 hour.  This has not recurred.  No other complaints today.  The patient is compliant with her medications.  Past Medical History:  Diagnosis Date   Abnormal thyroid biopsy 2018   results were negative.   Anemia    "when I was alot younger" (12/06/2017)   Anxiety    self reported   Arthritis    "almost all over; used to cry w/it when I was in my teens" (12/06/2017)   CHF (congestive heart failure) ([place])    Chronic back pain    "all over" (12/06/2017)   Depression    "lost my son last year to cancer; I tended to him; he lived w/me" (12/06/2017)   Diverticulosis of colon (without mention of hemorrhage)    External hemorrhoids without mention of complication    Fatty liver disease, nonalcoholic    Fibromyalgia    History of kidney stones    History of stomach ulcers  1970   Hyperlipemia    Hypertension    IBS (irritable bowel syndrome)    Morbid obesity ([place])    Psoriasis    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   26 mm Medtronic Evolut Pro transcatheter heart valve placed via percutaneous right transfemoral approach    Severe aortic stenosis    Sinus headache    Sleep apnea    "was told I do; never have had any problems w/it" (12/06/2017)   Type II diabetes mellitus ([place])     Past Surgical History:  Procedure Laterality Date   ABDOMINAL ADHESION SURGERY     "took 2 out"   ABDOMINAL HYSTERECTOMY     AMPUTATION TOE Right 02/12/2020   Procedure: Right hallux and second toe amputation;  Surgeon: Wylene Simmer, MD;  Location: [facility];  Service: Orthopedics;  Laterality: Right;   APPENDECTOMY     BACK SURGERY     BIOPSY  08/05/2020   Procedure: BIOPSY;  Surgeon: Wilford Corner, MD;  Location: [facility];  Service: Endoscopy;;   BIOPSY THYROID  2018   results were negative.   BREAST LUMPECTOMY Left X 1   "benign"   CATARACT EXTRACTION W/ INTRAOCULAR LENS  IMPLANT, BILATERAL Bilateral    CATARACT EXTRACTION W/ INTRAOCULAR LENS IMPLANT Bilateral 2017   COLONOSCOPY WITH PROPOFOL  N/A 08/05/2020   Procedure: COLONOSCOPY WITH PROPOFOL;  Surgeon: Wilford Corner, MD;  Location: [facility];  Service: Endoscopy;  Laterality: N/A;   ESOPHAGOGASTRODUODENOSCOPY (EGD) WITH PROPOFOL N/A 08/05/2020   Procedure: ESOPHAGOGASTRODUODENOSCOPY (EGD) WITH PROPOFOL;  Surgeon: Wilford Corner, MD;  Location: [facility];  Service: Endoscopy;  Laterality: N/A;   EYE SURGERY     HEMORRHOID BANDING     LAPAROSCOPIC CHOLECYSTECTOMY     LEFT AND RIGHT HEART CATHETERIZATION WITH CORONARY ANGIOGRAM N/A 11/15/2013   Procedure: LEFT AND RIGHT HEART CATHETERIZATION WITH CORONARY ANGIOGRAM;  Surgeon: Blane Ohara, MD;  Location: [facility] CATH LAB;  Service: Cardiovascular;  Laterality: N/A;   LUMBAR DISC SURGERY     POLYPECTOMY   08/05/2020   Procedure: POLYPECTOMY;  Surgeon: Wilford Corner, MD;  Location: [facility];  Service: Endoscopy;;   RETINAL DETACHMENT SURGERY Right    RIGHT/LEFT HEART CATH AND CORONARY ANGIOGRAPHY N/A 11/02/2017   Procedure: RIGHT/LEFT HEART CATH AND CORONARY ANGIOGRAPHY;  Surgeon: Burnell Blanks, MD;  Location: [facility] LAB;  Service: Cardiovascular;  Laterality: N/A;   TEE WITHOUT CARDIOVERSION N/A 12/06/2017   Procedure: TRANSESOPHAGEAL ECHOCARDIOGRAM (TEE);  Surgeon: [name], MD;  Location: [facility];  Service: Open Heart Surgery;  Laterality: N/A;   TONSILLECTOMY     TRANSCATHETER AORTIC VALVE REPLACEMENT, TRANSFEMORAL  12/06/2017   TRANSCATHETER AORTIC VALVE REPLACEMENT, TRANSFEMORAL N/A 12/06/2017   Procedure: TRANSCATHETER AORTIC VALVE REPLACEMENT, TRANSFEMORAL;  Surgeon: [name], MD;  Location: [facility];  Service: Open Heart Surgery;  Laterality: N/A;   TRANSESOPHAGEAL ECHOCARDIOGRAM  12/06/2017    Current Medications: Current Meds  Medication Sig   amoxicillin (AMOXIL) 500 MG tablet TAKE 4 TABLETS BY MOUTH ONE HOUR PRIOR TO DENTAL VISITS.   aspirin EC 81 MG tablet Take 1 tablet (81 mg total) by mouth daily.   atorvastatin (LIPITOR) 40 MG tablet Take 40 mg by mouth daily.   Cholecalciferol (VITAMIN D-3) 5000 units TABS Take 5,000 Units by mouth daily.   Cranberry 250 MG TABS Take 250 mg by mouth daily.   [employer] GENTLE LAXATIVE 5 MG EC tablet See admin instructions.   cyclobenzaprine (FLEXERIL) 10 MG tablet Take 10 mg by mouth 3 (three) times daily as needed for muscle spasms.   enalapril (VASOTEC) 10 MG tablet Take 10 mg by mouth daily.   furosemide (LASIX) 80 MG tablet Take 1 tablet (80 mg total) by mouth daily. (Patient taking differently: Take 80 mg by mouth 2 (two) times daily.)   GAVILYTE-G 236 g solution Take by mouth as directed.   glipiZIDE (GLUCOTROL XL) 10 MG 24 hr tablet Take 10 mg by mouth daily.   loratadine (CLARITIN) 10 MG tablet Take 10 mg  by mouth daily as needed.   metFORMIN (GLUCOPHAGE) 1000 MG tablet Take 1,000 mg by mouth 2 (two) times daily.   omeprazole (PRILOSEC) 40 MG capsule Take 1 capsule by mouth daily.   phentermine (ADIPEX-P) 37.5 MG tablet Take 37.5 mg by mouth daily.   potassium chloride SA (K-DUR,KLOR-CON) 20 MEQ tablet Take 2 tablets (40 mEq total) by mouth daily. (Patient taking differently: Take 40 mEq by mouth 2 (two) times daily.)     Allergies:   Sulfonamide derivatives, Castor oil, Amoxicillin, Demerol [meperidine], and Hydrocodone   Social History   Socioeconomic History   Marital status: Widowed    Spouse name: Not on file   Number of children: Not on file   Years of education: Not on file   Highest education level: Not on  file  Occupational History   Not on file  Tobacco Use   Smoking status: Former    Packs/day: 1.75    Years: 32.00    Pack years: 56.00    Types: Cigarettes    Quit date: 2002    Years since quitting: 20.9   Smokeless tobacco: Never  Vaping Use   Vaping Use: Never used  Substance and Sexual Activity   Alcohol use: No   Drug use: Never   Sexual activity: Not Currently  Other Topics Concern   Not on file  Social History Narrative   Not on file   Social Determinants of Health   Financial Resource Strain: Not on file  Food Insecurity: Not on file  Transportation Needs: Not on file  Physical Activity: Not on file  Stress: Not on file  Social Connections: Not on file     Family History: The patient's family history includes Diabetes in her maternal grandmother; Heart disease in her maternal grandfather; Lung cancer in her mother. There is no history of Colon cancer.  ROS:   Please see the history of present illness.    All other systems reviewed and are negative.  EKGs/Labs/Other Studies Reviewed:    The following studies were reviewed today: Today's echo is reviewed.  The formal interpretation is currently pending.  There appears to be normal  transcatheter heart valve function with peak and mean transvalvular gradients of 13 and 7 mmHg, respectively.  There is mild paravalvular regurgitation.  Overall these findings appear to be stable.  EKG:  EKG is ordered today.  The ekg ordered today demonstrates normal sinus rhythm 82 bpm, occasional PVC.  Low voltage QRS.  Recent Labs: 02/12/2020: BUN 17; Creatinine, Ser 1.00; Potassium 3.1; Sodium 140  Recent Lipid Panel No results found for: CHOL, TRIG, HDL, CHOLHDL, VLDL, LDLCALC, LDLDIRECT   Risk Assessment/Calculations:           Physical Exam:    VS:  BP (!) 143/58   Pulse 82   Ht 5' 1.5" (1.562 m)   Wt 262 lb 9.6 oz (119.1 kg)   SpO2 96%   BMI 48.81 kg/m     Wt Readings from Last 3 Encounters:  01/28/21 262 lb 9.6 oz (119.1 kg)  08/05/20 250 lb (113.4 kg)  02/12/20 265 lb 14 oz (120.6 kg)     GEN: Pleasant, morbidly obese woman in no acute distress HEENT: Normal NECK: No JVD; No carotid bruits LYMPHATICS: No lymphadenopathy CARDIAC: RRR, 2/6 systolic ejection murmur at the right upper sternal border with no diastolic murmur RESPIRATORY:  Clear to auscultation without rales, wheezing or rhonchi  ABDOMEN: Soft, non-tender, non-distended MUSCULOSKELETAL: 1+ bilateral pretibial edema; No deformity  SKIN: Warm and dry NEUROLOGIC:  Alert and oriented x 3 PSYCHIATRIC:  Normal affect   ASSESSMENT:    1. S/P TAVR (transcatheter aortic valve replacement)   2. Essential hypertension   3. Chronic diastolic CHF (congestive heart failure) ([place])   4. Severe obesity (BMI >= 40) (H[facility])    PLAN:    In order of problems listed above:  Echo images personally reviewed.  Appears to have normal transcatheter heart valve gradients and mild paravalvular regurgitation.  I think overall her findings are stable and she should continue with her current medical program.  She follows SBE prophylaxis is indicated.  She is treated with antiplatelet therapy on aspirin. Blood pressure  controlled on enalapril and furosemide. Appears clinically stable with New York Heart Association functional class II symptoms, likely  multifactorial from morbid obesity, aortic valve disease, diabetes, etc. Discussed weight loss strategies.  Trended her weight over time.  She has gained some weight since her last visit and we discussed this today.  She is going to work hard to get her weight back down.  Overall her weight has trended down significantly as she was greater than 300 pounds when she underwent TAVR in 2019.  I think the patient is clinically stable with respect to her aortic valve disease and cardiovascular status.  I will see her back in 1 year with a follow-up echocardiogram.      Medication Adjustments/Labs and Tests Ordered: Current medicines are reviewed at length with the patient today.  Concerns regarding medicines are outlined above.  Orders Placed This Encounter  Procedures   EKG 12-Lead   ECHOCARDIOGRAM COMPLETE    No orders of the defined types were placed in this encounter.   Patient Instructions  Medication Instructions:  Your physician recommends that you continue on your current medications as directed. Please refer to the Current Medication list given to you today.  *If you need a refill on your cardiac medications before your next appointment, please call your pharmacy*  Testing/Procedures: Your physician has requested that you have an echocardiogram in one year. Echocardiography is a painless test that uses sound waves to create images of your heart. It provides your doctor with information about the size and shape of your heart and how well your heart's chambers and valves are working. This procedure takes approximately one hour. There are no restrictions for this procedure.   Follow-Up: At [facility], you and your health needs are our priority.  As part of our continuing mission to provide you with exceptional heart care, we have created designated  Provider Care Teams.  These Care Teams include your primary Cardiologist (physician) and Advanced Practice Providers (APPs -  Physician Assistants and Nurse Practitioners) who all work together to provide you with the care you need, when you need it.  Your next appointment:   1 year(s)  The format for your next appointment:   In Person  Provider:   [name], MD      Signed, [name], MD  01/28/2021 1:40 PM    [place]

## 2021-01-28 NOTE — Patient Instructions (Signed)
Medication Instructions:  Your physician recommends that you continue on your current medications as directed. Please refer to the Current Medication list given to you today.  *If you need a refill on your cardiac medications before your next appointment, please call your pharmacy*  Testing/Procedures: Your physician has requested that you have an echocardiogram in one year. Echocardiography is a painless test that uses sound waves to create images of your heart. It provides your doctor with information about the size and shape of your heart and how well your heart's chambers and valves are working. This procedure takes approximately one hour. There are no restrictions for this procedure.   Follow-Up: At [facility], you and your health needs are our priority.  As part of our continuing mission to provide you with exceptional heart care, we have created designated Provider Care Teams.  These Care Teams include your primary Cardiologist (physician) and Advanced Practice Providers (APPs -  Physician Assistants and Nurse Practitioners) who all work together to provide you with the care you need, when you need it.  Your next appointment:   1 year(s)  The format for your next appointment:   In Person  Provider:   [name], MD

## 2021-04-17 ENCOUNTER — Other Ambulatory Visit: Payer: Self-pay

## 2021-04-17 ENCOUNTER — Encounter: Payer: Medicare Other | Attending: Physician Assistant | Admitting: Physician Assistant

## 2021-04-17 DIAGNOSIS — I509 Heart failure, unspecified: Secondary | ICD-10-CM | POA: Insufficient documentation

## 2021-04-17 DIAGNOSIS — Z87891 Personal history of nicotine dependence: Secondary | ICD-10-CM | POA: Insufficient documentation

## 2021-04-17 DIAGNOSIS — Z89411 Acquired absence of right great toe: Secondary | ICD-10-CM | POA: Diagnosis not present

## 2021-04-17 DIAGNOSIS — Z89421 Acquired absence of other right toe(s): Secondary | ICD-10-CM | POA: Diagnosis not present

## 2021-04-17 DIAGNOSIS — E11621 Type 2 diabetes mellitus with foot ulcer: Secondary | ICD-10-CM | POA: Insufficient documentation

## 2021-04-17 DIAGNOSIS — I110 Hypertensive heart disease with heart failure: Secondary | ICD-10-CM | POA: Insufficient documentation

## 2021-04-17 DIAGNOSIS — L97522 Non-pressure chronic ulcer of other part of left foot with fat layer exposed: Secondary | ICD-10-CM | POA: Insufficient documentation

## 2021-04-17 NOTE — Progress Notes (Signed)
[place], [name] ([id number]) Visit Report for 04/17/2021 Allergy List Details Patient Name: Cheryl, [name] Date of Service: 04/17/2021 12:45 PM Medical Record Number: [id number] Patient Account Number: [id number] Date of Birth/Sex: [birth year] (81 y.o. F) Treating RN: Levora Dredge Primary Care Mirela Parsley: Claris Gower Other Clinician: Referring Alexsis Kathman: Claris Gower Treating Maxwell Martorano/Extender: Jeri Cos Weeks in Treatment: 0 Allergies Active Allergies Sulfa (Sulfonamide Antibiotics) castor oil amoxicillin Demerol hydrocodone Allergy Notes Electronic Signature(s) Signed: 04/17/2021 4:59:38 PM By: Levora Dredge Entered By: Levora Dredge on 04/17/2021 14:00:40 [place]more, [name] ([id number]) -------------------------------------------------------------------------------- Arrival Information Details Patient Name: [name] Date of Service: 04/17/2021 12:45 PM Medical Record Number: [id number] Patient Account Number: [id number] Date of Birth/Sex: June 21, 1940 (81 y.o. F) Treating RN: Levora Dredge Primary Care Lysandra Loughmiller: Claris Gower Other Clinician: Referring Eura Mccauslin: Claris Gower Treating Oshay Stranahan/Extender: Skipper Cliche in Treatment: 0 Visit Information Patient Arrived: [place] Arrival Time: 12:48 Accompanied By: self Transfer Assistance: None Patient Identification Verified: Yes Secondary Verification Process Completed: Yes Electronic Signature(s) Signed: 04/17/2021 4:59:38 PM By: Levora Dredge Entered By: Levora Dredge on 04/17/2021 12:48:49 Paddock, [name] ([id number]) -------------------------------------------------------------------------------- Clinic Level of Care Assessment Details Patient Name: [name] Date of Service: 04/17/2021 12:45 PM Medical Record Number: [id number] Patient Account Number: [id number] Date of Birth/Sex: 1940/05/01 (81 y.o. F) Treating RN: Levora Dredge Primary Care Hannan Tetzlaff: Claris Gower Other  Clinician: Referring Kiernan Atkerson: Claris Gower Treating Doreatha Offer/Extender: Skipper Cliche in Treatment: 0 Clinic Level of Care Assessment Items TOOL 1 Quantity Score _0  - Use when EandM and Procedure is performed on INITIAL visit 0 ASSESSMENTS - Nursing Assessment / Reassessment _1  - General Physical Exam (combine w/ comprehensive assessment (listed just below) when performed on new 0 pt. evals) X- 1 25 Comprehensive Assessment (HX, ROS, Risk Assessments, Wounds Hx, etc.) ASSESSMENTS - Wound and Skin Assessment / Reassessment _2  - Dermatologic / Skin Assessment (not related to wound area) 0 ASSESSMENTS - Ostomy and/or Continence Assessment and Care _3  - Incontinence Assessment and Management 0 _4  - 0 Ostomy Care Assessment and Management (repouching, etc.) PROCESS - Coordination of Care X - Simple Patient / Family Education for ongoing care 1 15 _5  - 0 Complex (extensive) Patient / Family Education for ongoing care X- 1 10 Staff obtains Programmer, systems, Records, Test Results / Process Orders _6  - 0 Staff telephones HHA, Nursing Homes / Clarify orders / etc _7  - 0 Routine Transfer to another Facility (non-emergent condition) _8  - 0 Routine Hospital Admission (non-emergent condition) _9  - 0 New Admissions / Biomedical engineer / Ordering NPWT, Apligraf, etc. _10  - 0 Emergency Hospital Admission (emergent condition) PROCESS - Special Needs _11  - Pediatric / Minor Patient Management 0 _12  - 0 Isolation Patient Management _13  - 0 Hearing / Language / Visual special needs _14  - 0 Assessment of Community assistance (transportation, D/C planning, etc.) _15  - 0 Additional assistance / Altered mentation _16  - 0 Support Surface(s) Assessment (bed, cushion, seat, etc.) INTERVENTIONS - Miscellaneous _17  - External ear exam 0 _18  - 0 Patient Transfer (multiple staff / Civil Service fast streamer / Similar devices) _19  - 0 Simple Staple / Suture removal (25 or less) _20  - 0 Complex Staple / Suture removal  (26 or more) _21  - 0 Hypo/Hyperglycemic Management (do not check if billed separately) X- 1 15 Ankle / Brachial Index (ABI) - do not check if billed separately Has the patient been seen at the hospital within the last three years: Yes Total Score: 65 Level Of Care: New/Established - Level 2 Kuhar, Melana A. ([id number])  Electronic Signature(s) Signed: 04/17/2021 4:59:38 PM By: Levora Dredge Entered By: Levora Dredge on 04/17/2021 13:56:46 Chenevert, [name] ([id number]) -------------------------------------------------------------------------------- Encounter Discharge Information Details Patient Name: [name] Date of Service: 04/17/2021 12:45 PM Medical Record Number: [id number] Patient Account Number: [id number] Date of Birth/Sex: November 21, 1940 (81 y.o. F) Treating RN: Levora Dredge Primary Care Alton Tremblay: Claris Gower Other Clinician: Referring Tesla Bochicchio: Claris Gower Treating Kosisochukwu Goldberg/Extender: Skipper Cliche in Treatment: 0 Encounter Discharge Information Items Post Procedure Vitals Discharge Condition: Stable Temperature (F): 97.5 Ambulatory Status: Cane Pulse (bpm): 76 Discharge Destination: Home Respiratory Rate (breaths/min): 18 Transportation: Private Auto Blood Pressure (mmHg): 153/75 Accompanied By: self Schedule Follow-up Appointment: No Clinical Summary of Care: Electronic Signature(s) Signed: 04/17/2021 4:59:38 PM By: Levora Dredge Entered By: Levora Dredge on 04/17/2021 13:58:07 Pasley, [name] ([id number]) -------------------------------------------------------------------------------- Lower Extremity Assessment Details Patient Name: [name] Date of Service: 04/17/2021 12:45 PM Medical Record Number: [id number] Patient Account Number: [id number] Date of Birth/Sex: [birth year] (81 y.o. F) Treating RN: Levora Dredge Primary Care Babak Lucus: Claris Gower Other Clinician: Referring Lajean Boese: Claris Gower Treating Latandra Loureiro/Extender:  Jeri Cos Weeks in Treatment: 0 Edema Assessment Assessed: [Left: No] [Right: No] Edema: [Left: Ye] [Right: s] Calf Left: Right: Point of Measurement: 30 cm From Medial Instep 39.5 cm Ankle Left: Right: Point of Measurement: 10 cm From Medial Instep 27.5 cm Vascular Assessment Pulses: Dorsalis Pedis Doppler Audible: [Left:Yes] Posterior Tibial Doppler Audible: [Left:Yes] Blood Pressure: Brachial: [Left:153] Dorsalis Pedis: 120 Ankle: Posterior Tibial: 130 Ankle Brachial Index: [Left:0.85] Electronic Signature(s) Signed: 04/17/2021 4:59:38 PM By: Levora Dredge Entered By: Levora Dredge on 04/17/2021 13:16:23 Buddenhagen, [name] ([id number]) -------------------------------------------------------------------------------- Multi Wound Chart Details Patient Name: [name] Date of Service: 04/17/2021 12:45 PM Medical Record Number: [id number] Patient Account Number: [id number] Date of Birth/Sex: Aug 04, 1940 (81 y.o. 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 Lower N/A Extremity Extremity Comorbid History: Chronic sinus problems/congestion, Chronic sinus problems/congestion, N/A Anemia, Sleep Apnea, Congestive Anemia, Sleep Apnea, Congestive Heart Failure, Hypertension, Type II Heart Failure, Hypertension, Type II Diabetes, Neuropathy Diabetes, Neuropathy Date Acquired: 02/23/2020 04/17/2021 N/A Weeks of Treatment: 0 0 N/A Wound Status: Open Open  N/A Wound Recurrence: No No N/A Measurements L x W x D (cm) 2x3x0.1 1.2x1.2x0.1 N/A Area (cm) : 4.712 1.131 N/A Volume (cm) : 0.471 0.113 N/A Classification: Grade 1 Grade 2 N/A Exudate Amount: Medium Medium N/A Exudate Type: Serosanguineous Serosanguineous N/A Exudate Color: red, brown red, brown N/A Wound Margin: Thickened N/A N/A Granulation Amount: None Present (0%) None Present (0%) N/A Necrotic Amount: Large (67-100%) Large (67-100%) N/A Necrotic Tissue: Eschar, Adherent Slough Eschar, Adherent Slough N/A Epithelialization: None None N/A Debridement: Debridement - Excisional Debridement - Excisional N/A Pre-procedure Verification/Time 13:38 13:29 N/A Out Taken: Tissue Debrided: Callus, Subcutaneous, Slough Callus, Subcutaneous, Slough N/A Level: Skin/Subcutaneous Tissue Skin/Subcutaneous Tissue N/A Debridement Area (sq cm): 6 1.44 N/A Instrument: Curette Curette N/A Bleeding: Moderate Minimum N/A Hemostasis Achieved: Pressure Pressure N/A Debridement Treatment Procedure was tolerated well Procedure was tolerated well N/A Response: Post Debridement 1.2x1.3x0.1 0.2x0.3x0.1 N/A Measurements L x W x D (cm) Post Debridement Volume: 0.123 0.005 N/A (cm) Procedures  Performed: Debridement Debridement N/A Reas, [name] ([id number]) Treatment Notes Wound #1 (Toe Great) Wound Laterality: Left Cleanser [place] Ancillary Kit - 15 Day Supply Discharge Instruction: Use supplies as instructed; Kit contains: (15) Saline Bullets; (15) 3x3 Gauze; 15 pr Gloves Normal Saline Discharge Instruction: Wash your hands with soap and water. Remove old dressing, discard into plastic bag and place into trash. Cleanse the wound with Normal Saline prior to applying a clean dressing using gauze sponges, not tissues or cotton balls. Do not scrub or use excessive force. Pat dry using gauze sponges, not tissue or cotton balls. Peri-Wound Care Topical Primary Dressing Hydrofera Blue Ready Transfer  Foam, 2.5x2.5 (in/in) Discharge Instruction: Apply Hydrofera Blue Ready to wound bed as directed Secondary Dressing Gauze Discharge Instruction: As directed: Secured With 20M Medipore H Soft Cloth Surgical Tape, 2x2 (in/yd) Conforming Stretch Gauze Bandage 2x75 (in/in) Discharge Instruction: knuckle band aid Compression Wrap Compression Stockings Add-Ons Wound #2 (Toe Second) Wound Laterality: Left Cleanser [place] Ancillary Kit - 15 Day Supply Discharge Instruction: Use supplies as instructed; Kit contains: (15) Saline Bullets; (15) 3x3 Gauze; 15 pr Gloves Normal Saline Discharge Instruction: Wash your hands with soap and water. Remove old dressing, discard into plastic bag and place into trash. Cleanse the wound with Normal Saline prior to applying a clean dressing using gauze sponges, not tissues or cotton balls. Do not scrub or use excessive force. Pat dry using gauze sponges, not tissue or cotton balls. Peri-Wound Care Topical Primary Dressing Hydrofera Blue Ready Transfer Foam, 2.5x2.5 (in/in) Discharge Instruction: Apply Hydrofera Blue Ready to wound bed as directed Secondary Dressing Gauze Discharge Instruction: As directed: Secured With 20M [place] Surgical Tape, 2x2 (in/yd) Conforming Stretch Gauze Bandage 2x75 (in/in) Discharge Instruction: knuckle band aid Compression Wrap Compression Stockings Add-Ons Carducci, [name] ([id number]) Electronic Signature(s) Signed: 04/17/2021 4:59:38 PM By: Levora Dredge Entered By: Levora Dredge on 04/17/2021 13:59:32 Larke, [name] ([id number]) -------------------------------------------------------------------------------- Multi-Disciplinary Care Plan Details Patient Name: [name] Date of Service: 04/17/2021 12:45 PM Medical Record Number: [id number] Patient Account Number: [id number] Date of Birth/Sex: 01-09-1941 (81 y.o. F) Treating RN: Levora Dredge Primary Care Stormi Vandevelde: Claris Gower Other  Clinician: Referring Adylynn Hertenstein: Claris Gower Treating Irineo Gaulin/Extender: Skipper Cliche in Treatment: 0 Active Inactive Orientation to the Wound Care Program Nursing Diagnoses: Knowledge deficit related to the wound healing center program Goals: Patient/caregiver will verbalize understanding of the [facility] Program Date Initiated: 04/17/2021 Target Resolution Date: 04/17/2021 Goal Status: Active Interventions: Provide education on orientation to the wound center Notes: Wound/Skin Impairment Nursing Diagnoses: Impaired tissue integrity Knowledge deficit related to ulceration/compromised skin integrity Goals: Ulcer/skin breakdown will have a volume reduction of 30% by week 4 Date Initiated: 04/17/2021 Target Resolution Date: 05/15/2021 Goal Status: Active Ulcer/skin breakdown will have a volume reduction of 50% by week 8 Date Initiated: 04/17/2021 Target Resolution Date: 06/12/2021 Goal Status: Active Ulcer/skin breakdown will have a volume reduction of 80% by week 12 Date Initiated: 04/17/2021 Target Resolution Date: 07/10/2021 Goal Status: Active Ulcer/skin breakdown will heal within 14 weeks Date Initiated: 04/17/2021 Target Resolution Date: 07/24/2021 Goal Status: Active Interventions: Assess patient/caregiver ability to obtain necessary supplies Assess patient/caregiver ability to perform ulcer/skin care regimen upon admission and as needed Assess ulceration(s) every visit Provide education on ulcer and skin care Notes: Electronic Signature(s) Signed: 04/17/2021 4:59:38 PM By: Levora Dredge Entered By: Levora Dredge on 04/17/2021 13:59:11 Vandewater, [name] ([id number]) -------------------------------------------------------------------------------- Pain Assessment Details Patient Name: [name] Date of Service: 04/17/2021  12:45 PM Medical Record Number: [id number] Patient Account Number: [id number] Date of Birth/Sex: 27-Oct-1940 (81 y.o. F) Treating  RN: Levora Dredge Primary Care Jurni Cesaro: Claris Gower Other Clinician: Referring Chauntelle Azpeitia: Claris Gower Treating Merie Wulf/Extender: Skipper Cliche in Treatment: 0 Active Problems Location of Pain Severity and Description of Pain Patient Has Paino Yes Site Locations Rate the pain. Current Pain Level: 5 Pain Management and Medication Current Pain Management: Notes chronic back pain Electronic Signature(s) Signed: 04/17/2021 4:59:38 PM By: Levora Dredge Entered By: Levora Dredge on 04/17/2021 12:49:16 Heather, [name] ([id number]) -------------------------------------------------------------------------------- Patient/Caregiver Education Details Patient Name: [name] Date of Service: 04/17/2021 12:45 PM Medical Record Number: [id number] Patient Account Number: [id number] Date of Birth/Gender: 01-Jul-1940 (81 y.o. F) Treating RN: Levora Dredge Primary Care Physician: Claris Gower Other Clinician: Referring Physician: Claris Gower Treating Physician/Extender: Skipper Cliche in Treatment: 0 Education Assessment Education Provided To: Patient Education Topics Provided Wound/Skin Impairment: Handouts: Caring for Your Ulcer Methods: Explain/Verbal Responses: State content correctly Electronic Signature(s) Signed: 04/17/2021 4:59:38 PM By: Levora Dredge Entered By: Levora Dredge on 04/17/2021 13:57:13 Jackson, [name] ([id number]) -------------------------------------------------------------------------------- Wound Assessment Details Patient Name: [name] Date of Service: 04/17/2021 12:45 PM Medical Record Number: [id number] Patient Account Number: [id number] Date of Birth/Sex: 12/13/1940 (81 y.o. F) Treating RN: Levora Dredge Primary Care Gabriella Woodhead: Claris Gower Other Clinician: Referring Odeal Welden: Claris Gower Treating Chandria Rookstool/Extender: Jeri Cos Weeks in Treatment: 0 Wound Status Wound Number: 1 Primary Diabetic Wound/Ulcer of  the Lower Extremity Etiology: Wound Location: Left Toe Great Wound Open Wounding Event: Gradually Appeared Status: Date Acquired: 02/23/2020 Comorbid Chronic sinus problems/congestion, Anemia, Sleep Apnea, Weeks Of Treatment: 0 History: Congestive Heart Failure, Hypertension, Type II Diabetes, Clustered Wound: No Neuropathy Photos Wound Measurements Length: (cm) 2 Width: (cm) 3 Depth: (cm) 0.1 Area: (cm) 4.712 Volume: (cm) 0.471 % Reduction in Area: % Reduction in Volume: Epithelialization: None Tunneling: No Undermining: No Wound Description Classification: Grade 1 Wound Margin: Thickened Exudate Amount: Medium Exudate Type: Serosanguineous Exudate Color: red, brown Foul Odor After Cleansing: No Slough/Fibrino Yes Wound Bed Granulation Amount: None Present (0%) Necrotic Amount: Large (67-100%) Necrotic Quality: Eschar, Adherent Slough Treatment Notes Wound #1 (Toe Great) Wound Laterality: Left Cleanser Byram Ancillary Kit - 15 Day Supply Discharge Instruction: Use supplies as instructed; Kit contains: (15) Saline Bullets; (15) 3x3 Gauze; 15 pr Gloves Normal Saline Discharge Instruction: Wash your hands with soap and water. Remove old dressing, discard into plastic bag and place into trash. Cleanse the wound with Normal Saline prior to applying a clean dressing using gauze sponges, not tissues or cotton balls. Do not scrub or use excessive force. Pat dry using gauze sponges, not tissue or cotton balls. Scholler, [name] ([id number]) Peri-Wound Care Topical Primary Dressing Hydrofera Blue Ready Transfer Foam, 2.5x2.5 (in/in) Discharge Instruction: Apply Hydrofera Blue Ready to wound bed as directed Secondary Dressing Gauze Discharge Instruction: As directed: Secured With 75M [place] Surgical Tape, 2x2 (in/yd) Conforming Stretch Gauze Bandage 2x75 (in/in) Discharge Instruction: knuckle band aid Compression Wrap Compression  Stockings Add-Ons Electronic Signature(s) Signed: 04/17/2021 4:59:38 PM By: Levora Dredge Entered By: Levora Dredge on 04/17/2021 13:19:15 Rappa, [name] ([id number]) -------------------------------------------------------------------------------- Wound Assessment Details Patient Name: [name] Date of Service: 04/17/2021 12:45 PM Medical Record Number: [id number] Patient Account Number: [id number] Date of Birth/Sex: [birth year] (81 y.o. F) Treating RN: Levora Dredge Primary Care Natanel Snavely: Claris Gower Other Clinician: Referring Chesnie Capell: Claris Gower Treating Mando Blatz/Extender: Jeri Cos Weeks in Treatment: 0 Wound Status Wound Number:  2 Primary Diabetic Wound/Ulcer of the Lower Extremity Etiology: Wound Location: Left Toe Second Wound Open Wounding Event: Gradually Appeared Status: Date Acquired: 04/17/2021 Comorbid Chronic sinus problems/congestion, Anemia, Sleep Apnea, Weeks Of Treatment: 0 History: Congestive Heart Failure, Hypertension, Type II Diabetes, Clustered Wound: No Neuropathy Photos Wound Measurements Length: (cm) 1.2 Width: (cm) 1.2 Depth: (cm) 0.1 Area: (cm) 1.131 Volume: (cm) 0.113 % Reduction in Area: % Reduction in Volume: Epithelialization: None Tunneling: No Undermining: No Wound Description Classification: Grade 2 Exudate Amount: Medium Exudate Type: Serosanguineous Exudate Color: red, brown Foul Odor After Cleansing: No Slough/Fibrino Yes Wound Bed Granulation Amount: None Present (0%) Necrotic Amount: Large (67-100%) Necrotic Quality: Eschar, Adherent Slough Treatment Notes Wound #2 (Toe Second) Wound Laterality: Left Cleanser Byram Ancillary Kit - 15 Day Supply Discharge Instruction: Use supplies as instructed; Kit contains: (15) Saline Bullets; (15) 3x3 Gauze; 15 pr Gloves Normal Saline Discharge Instruction: Wash your hands with soap and water. Remove old dressing, discard into plastic bag and place into  trash. Cleanse the wound with Normal Saline prior to applying a clean dressing using gauze sponges, not tissues or cotton balls. Do not scrub or use excessive force. Pat dry using gauze sponges, not tissue or cotton balls. Peri-Wound Care Neuzil, SAMONA CHIHUAHUA ([id number]) Topical Primary Dressing Hydrofera Blue Ready Transfer Foam, 2.5x2.5 (in/in) Discharge Instruction: Apply Hydrofera Blue Ready to wound bed as directed Secondary Dressing Gauze Discharge Instruction: As directed: Secured With 77M [place] Surgical Tape, 2x2 (in/yd) Conforming Stretch Gauze Bandage 2x75 (in/in) Discharge Instruction: knuckle band aid Compression Wrap Compression Stockings Add-Ons Electronic Signature(s) Signed: 04/17/2021 4:59:38 PM By: Levora Dredge Entered By: Levora Dredge on 04/17/2021 13:34:21 Dery, [name] ([id number]) -------------------------------------------------------------------------------- Vitals Details Patient Name: [name] Date of Service: 04/17/2021 12:45 PM Medical Record Number: [id number] Patient Account Number: [id number] Date of Birth/Sex: January 24, 1941 (81 y.o. F) Treating RN: Levora Dredge Primary Care Samary Shatz: Claris Gower Other Clinician: Referring Ardeth Repetto: Claris Gower Treating Jamine Wingate/Extender: Skipper Cliche in Treatment: 0 Vital Signs Time Taken: 12:49 Temperature (F): 97.5 Height (in): 61 Pulse (bpm): 76 Source: Stated Respiratory Rate (breaths/min): 18 Weight (lbs): 250 Blood Pressure (mmHg): 153/75 Source: Stated Reference Range: 80 - 120 mg / dl Body Mass Index (BMI): 47.2 Electronic Signature(s) Signed: 04/17/2021 4:59:38 PM By: Levora Dredge Entered By: Levora Dredge on 04/17/2021 12:53:41

## 2021-04-17 NOTE — Progress Notes (Signed)
[name], [name] ([id number]) Visit Report for 04/17/2021 Abuse Risk Screen Details Patient Name: [name], [name] Date of Service: 04/17/2021 12:45 PM Medical Record Number: [id number] Patient Account Number: [id number] Date of Birth/Sex: May 18, 1940 (81 y.o. F) Treating RN: Levora Dredge Primary Care Yani Lal: Claris Gower Other Clinician: Referring Nylene Inlow: Claris Gower Treating Kambre Messner/Extender: Skipper Cliche in Treatment: 0 Abuse Risk Screen Items Answer ABUSE RISK SCREEN: Has anyone close to you tried to hurt or harm you recentlyo No Do you feel uncomfortable with anyone in your familyo No Has anyone forced you do things that you didnot want to doo No Electronic Signature(s) Signed: 04/17/2021 4:59:38 PM By: Levora Dredge Entered By: Levora Dredge on 04/17/2021 12:59:22 [place]au, [name] ([id number]) -------------------------------------------------------------------------------- Activities of Daily Living Details Patient Name: [name] Date of Service: 04/17/2021 12:45 PM Medical Record Number: [id number] Patient Account Number: [id number] Date of Birth/Sex: 03/24/1940 (81 y.o. F) Treating RN: Levora Dredge Primary Care Matthe Sloane: Claris Gower Other Clinician: Referring Annjeanette Sarwar: Claris Gower Treating Jamarie Mussa/Extender: Skipper Cliche in Treatment: 0 Activities of Daily Living Items Answer Activities of Daily Living (Please select one for each item) Drive Automobile Not Able Take Medications Completely Able Use Telephone Completely Able Care for Appearance Completely Able Use Toilet Completely Able Bath / Shower Completely Able Dress Self Completely Able Feed Self Completely Able Walk Completely Able Get In / Out Bed Completely Able Housework Need Assistance Prepare Meals Completely [place] for Self Need Assistance Electronic Signature(s) Signed: 04/17/2021 4:59:38 PM By: Levora Dredge Entered By: Levora Dredge on 04/17/2021 12:59:54 Mandel, [name] ([id number]) -------------------------------------------------------------------------------- Education Screening Details Patient Name: [name] Date of Service: 04/17/2021 12:45 PM Medical Record Number: [id number] Patient Account Number: [id number] Date of Birth/Sex: 1940-11-16 (81 y.o. F) Treating RN: Levora Dredge Primary Care Ivy Puryear: Claris Gower Other Clinician: Referring Evany Schecter: Claris Gower Treating Chaquita Basques/Extender: Skipper Cliche in Treatment: 0 Learning Preferences/Education Level/Primary Language Learning Preference: Explanation, Demonstration, Video, Communication Board, Printed Material Highest Education Level: High School Preferred Language: English Cognitive Barrier Language Barrier: No Translator Needed: No Memory Deficit: No Emotional Barrier: No Cultural/Religious Beliefs Affecting Medical Care: No Physical Barrier Impaired Vision: Yes Glasses Impaired Hearing: No Decreased Hand dexterity: No Knowledge/Comprehension Knowledge Level: High Comprehension Level: High Ability to understand written instructions: High Ability to understand verbal instructions: High Motivation Anxiety Level: Calm Cooperation: Cooperative Education Importance: Acknowledges Need Interest in Health Problems: Asks Questions Perception: Coherent Willingness to Engage in Self-Management High Activities: Readiness to Engage in Self-Management High Activities: Electronic Signature(s) Signed: 04/17/2021 4:59:38 PM By: Levora Dredge Entered By: Levora Dredge on 04/17/2021 13:00:24 Heitz, [name] ([id number]) -------------------------------------------------------------------------------- Fall Risk Assessment Details Patient Name: [name] Date of Service: 04/17/2021 12:45 PM Medical Record Number: [id number] Patient Account Number: [id number] Date of Birth/Sex: 1940/11/25 (81 y.o. F) Treating RN: Levora Dredge Primary Care Jovi Alvizo: Claris Gower Other Clinician: Referring Vaudie Engebretsen: Claris Gower Treating Claudia Alvizo/Extender: Skipper Cliche in Treatment: 0 Fall Risk Assessment Items Have you had 2 or more falls in the last 12 monthso 0 No Have you had any fall that resulted in injury in the last 12 monthso 0 No FALLS RISK SCREEN History of falling - immediate or within 3 months 0 No Secondary diagnosis (Do you have 2 or more medical diagnoseso) 0 No Ambulatory aid None/bed rest/wheelchair/nurse 0 No Crutches/cane/walker 15 Yes Furniture 0 No Intravenous therapy Access/Saline/Heparin Lock 0 No Gait/Transferring Normal/ bed rest/ wheelchair 0 Yes Weak (short steps with or  without shuffle, stooped but able to lift head while walking, may 0 No seek support from furniture) Impaired (short steps with shuffle, may have difficulty arising from chair, head down, impaired 0 No balance) Mental Status Oriented to own ability 0 Yes Electronic Signature(s) Signed: 04/17/2021 4:59:38 PM By: Levora Dredge Entered By: Levora Dredge on 04/17/2021 13:00:45 Viscomi, [name] ([id number]) -------------------------------------------------------------------------------- Foot Assessment Details Patient Name: [name] Date of Service: 04/17/2021 12:45 PM Medical Record Number: [id number] Patient Account Number: [id number] Date of Birth/Sex: 03-Mar-1940 (81 y.o. F) Treating RN: Levora Dredge Primary Care Sebastain Fishbaugh: Claris Gower Other Clinician: Referring Amiere Cawley: Claris Gower Treating Taylynn Easton/Extender: Skipper Cliche in Treatment: 0 Foot Assessment Items Site Locations + = Sensation present, - = Sensation absent, C = Callus, U = Ulcer R = Redness, W = Warmth, M = Maceration, PU = Pre-ulcerative lesion F = Fissure, S = Swelling, D = Dryness Assessment Right: Left: Other Deformity: No No Prior Foot Ulcer: No No Prior Amputation: Yes No Charcot Joint: No No Ambulatory  Status: Ambulatory With Help Assistance Device: Cane Gait: Steady Electronic Signature(s) Signed: 04/17/2021 4:59:38 PM By: Levora Dredge Entered By: Levora Dredge on 04/17/2021 13:06:57 Silverio, [name] ([id number]) -------------------------------------------------------------------------------- Nutrition Risk Screening Details Patient Name: [name] Date of Service: 04/17/2021 12:45 PM Medical Record Number: [id number] Patient Account Number: [id number] Date of Birth/Sex: 13-Feb-1941 (81 y.o. F) Treating RN: Levora Dredge Primary Care Sui Kasparek: Claris Gower Other Clinician: Referring Daymeon Fischman: Claris Gower Treating Tomie Elko/Extender: Jeri Cos Weeks in Treatment: 0 Height (in): 61 Weight (lbs): 250 Body Mass Index (BMI): 47.2 Nutrition Risk Screening Items Score Screening NUTRITION RISK SCREEN: I have an illness or condition that made me change the kind and/or amount of food I eat 0 No I eat fewer than two meals per day 0 No I eat few fruits and vegetables, or milk products 0 No I have three or more drinks of beer, liquor or wine almost every day 0 No I have tooth or mouth problems that make it hard for me to eat 0 No I don't always have enough money to buy the food I need 0 No I eat alone most of the time 0 No I take three or more different prescribed or over-the-counter drugs a day 0 No Without wanting to, I have lost or gained 10 pounds in the last six months 0 No I am not always physically able to shop, cook and/or feed myself 0 No Nutrition Protocols Good Risk Protocol Provide education on elevated Moderate Risk Protocol 0 blood sugars and impact on wound healing, as applicable High Risk Proctocol Risk Level: Good Risk Score: 0 Electronic Signature(s) Signed: 04/17/2021 4:59:38 PM By: Levora Dredge Entered By: Levora Dredge on 04/17/2021 13:01:06

## 2021-04-17 NOTE — Progress Notes (Addendum)
[name] ([id number]) Visit Report for 04/17/2021 Chief Complaint Document Details Patient Name: [name], [name] Date of Service: 04/17/2021 12:45 PM Medical Record Number: [id number] Patient Account Number: [id number] Date of Birth/Sex: 09-10-1940 (81 y.o. F) Treating RN: Levora Dredge Primary Care Provider: Claris Gower Other Clinician: Referring Provider: Claris Gower Treating Provider/Extender: Skipper Cliche in Treatment: 0 Information Obtained from: Patient Chief Complaint Left 1st toe ulcer Electronic Signature(s) Signed: 04/17/2021 1:23:17 PM By: Worthy Keeler PA-C Entered By: Worthy Keeler on 04/17/2021 13:23:17 Dobek, Kayren [name] ([id number]) -------------------------------------------------------------------------------- Debridement Details Patient Name: [name] Date of Service: 04/17/2021 12:45 PM Medical Record Number: [id number] Patient Account Number: [id number] Date of Birth/Sex: [birth year] (81 y.o. F) Treating RN: Levora Dredge Primary Care Provider: Claris Gower Other Clinician: Referring Provider: Claris Gower Treating Provider/Extender: Skipper Cliche in Treatment: 0 Debridement Performed for Wound #2 Left Toe Second Assessment: Performed By: Physician Tommie Sams., PA-C Debridement Type: Debridement Severity of Tissue Pre Debridement: Fat layer exposed Level of Consciousness (Pre- Awake and Alert procedure): Pre-procedure Verification/Time Out Yes - 13:29 Taken: Total Area Debrided (L x W): 1.2 (cm) x 1.2 (cm) = 1.44 (cm) Tissue and other material Viable, Non-Viable, Callus, Slough, Subcutaneous, Slough debrided: Level: Skin/Subcutaneous Tissue Debridement Description: Excisional Instrument: Curette Bleeding: Minimum Hemostasis Achieved: Pressure Response to Treatment: Procedure was tolerated well Level of Consciousness (Post- Awake and Alert procedure): Post Debridement Measurements of Total Wound Length:  (cm) 0.2 Width: (cm) 0.3 Depth: (cm) 0.1 Volume: (cm) 0.005 Character of Wound/Ulcer Post Debridement: Stable Severity of Tissue Post Debridement: Fat layer exposed Post Procedure Diagnosis Same as Pre-procedure Electronic Signature(s) Signed: 04/17/2021 4:05:24 PM By: Worthy Keeler PA-C Signed: 04/17/2021 4:59:38 PM By: Levora Dredge Entered By: Levora Dredge on 04/17/2021 13:36:36 [name] ([id number]) -------------------------------------------------------------------------------- Debridement Details Patient Name: [name] Date of Service: 04/17/2021 12:45 PM Medical Record Number: [id number] Patient Account Number: [id number] Date of Birth/Sex: 1940/05/02 (81 y.o. F) Treating RN: Levora Dredge Primary Care Provider: Claris Gower Other Clinician: Referring Provider: Claris Gower Treating Provider/Extender: Jeri Cos Weeks in Treatment: 0 Debridement Performed for Wound #1 Left Toe Great Assessment: Performed By: Physician Tommie Sams., PA-C Debridement Type: Debridement Severity of Tissue Pre Debridement: Fat layer exposed Level of Consciousness (Pre- Awake and Alert procedure): Pre-procedure Verification/Time Out Yes - 13:38 Taken: Total Area Debrided (L x W): 2 (cm) x 3 (cm) = 6 (cm) Tissue and other material Viable, Non-Viable, Callus, Slough, Subcutaneous, Slough debrided: Level: Skin/Subcutaneous Tissue Debridement Description: Excisional Instrument: Curette Bleeding: Moderate Hemostasis Achieved: Pressure Response to Treatment: Procedure was tolerated well Level of Consciousness (Post- Awake and Alert procedure): Post Debridement Measurements of Total Wound Length: (cm) 1.2 Width: (cm) 1.3 Depth: (cm) 0.1 Volume: (cm) 0.123 Character of Wound/Ulcer Post Debridement: Stable Severity of Tissue Post Debridement: Fat layer exposed Post Procedure Diagnosis Same as Pre-procedure Electronic Signature(s) Signed: 04/17/2021  4:05:24 PM By: Worthy Keeler PA-C Signed: 04/17/2021 4:59:38 PM By: Levora Dredge Entered By: Levora Dredge on 04/17/2021 13:47:00 [name] ([id number]) -------------------------------------------------------------------------------- HPI Details Patient Name: [name] Date of Service: 04/17/2021 12:45 PM Medical Record Number: [id number] Patient Account Number: [id number] Date of Birth/Sex: [birth year] (81 y.o. F) Treating RN: Levora Dredge Primary Care Provider: Claris Gower Other Clinician: Referring Provider: Claris Gower Treating Provider/Extender: Skipper Cliche in Treatment: 0 History of Present Illness HPI Description: 04/17/2021 upon evaluation patient appears to be doing somewhat poorly currently in regard to her first and second  toe of the left foot. She previously has been seen by Dr. Doran Durand she saw him on 31 January he did not feel there was any evidence of osteomyelitis. He did give her a thorough evaluation including x-rays and showed no abnormal findings according to notes. With that being said he felt like that wound care would be beneficial therefore she contacted [place]. She has currently been using antibiotic ointment and has noted that this wound on the great toe has been there for about a year. She is not certain whether there is anything on the second toe or not she has not really noted any drainage but at the same time she cannot be sure there is nothing hiding up underneath a significant callus here. Her feet really do not have any major complications with regard to pain and that is good news. She does have neuropathy. Patient does have a history of diabetes mellitus type 2, hypertension, and her most recent hemoglobin A1c was 7.6 on June 2022. Electronic Signature(s) Signed: 04/17/2021 2:30:45 PM By: Worthy Keeler PA-C Entered By: Worthy Keeler on 04/17/2021 14:30:45 Bisono, Kayren [name]  ([id number]) -------------------------------------------------------------------------------- Physical Exam Details Patient Name: [name] Date of Service: 04/17/2021 12:45 PM Medical Record Number: [id number] Patient Account Number: [id number] Date of Birth/Sex: Aug 22, 1940 (81 y.o. F) Treating RN: Levora Dredge Primary Care Provider: Claris Gower Other Clinician: Referring Provider: Claris Gower Treating Provider/Extender: Skipper Cliche in Treatment: 0 Constitutional patient is hypertensive.. pulse regular and within target range for patient.Marland Kitchen respirations regular, non-labored and within target range for patient.Marland Kitchen temperature within target range for patient.. Well-nourished and well-hydrated in no acute distress. Eyes conjunctiva clear no eyelid edema noted. pupils equal round and reactive to light and accommodation. Ears, Nose, Mouth, and Throat no gross abnormality of ear auricles or external auditory canals. normal hearing noted during conversation. mucus membranes moist. Respiratory normal breathing without difficulty. Cardiovascular 2+ dorsalis pedis/posterior tibialis pulses. no clubbing, cyanosis, significant edema, <3 sec cap refill. Musculoskeletal normal gait and posture. no significant deformity or arthritic changes, no loss or range of motion, no clubbing. Psychiatric this patient is able to make decisions and demonstrates good insight into disease process. Alert and Oriented x 3. pleasant and cooperative. Notes Upon inspection patient's wound actually showed signs of good fairly good granulation epithelization at this point. Fortunately I do not see any evidence of active infection locally nor systemically at this time which is great news and overall I am very pleased with where things stand. I do think she has a significant amount of callus buildup that we can need to address here. I explained that to the patient today and she is in agreement with  undergoing debridement currently. Fortunately I was able to debride away the callus unfortunately she did have an open wound underneath the callus of the second toe as well although this is quite small. Nonetheless I am glad we found it so we get this healed hopefully before it becomes any kind of bigger complication here. Electronic Signature(s) Signed: 04/17/2021 2:31:28 PM By: Worthy Keeler PA-C Entered By: Worthy Keeler on 04/17/2021 14:31:28 Thelin, Kayren [name] ([id number]) -------------------------------------------------------------------------------- Physician Orders Details Patient Name: [name] Date of Service: 04/17/2021 12:45 PM Medical Record Number: [id number] Patient Account Number: [id number] Date of Birth/Sex: 08-18-1940 (81 y.o. 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 o Nurse Visit as needed [employer] o Wash wounds with antibacterial soap and water. o May shower; gently cleanse wound with antibacterial soap, rinse and pat dry prior to dressing wounds o No tub bath. Edema Control - Lymphedema / Segmental Compressive Device / Other o Elevate leg(s) parallel to the floor when sitting. o DO YOUR BEST to sleep in the bed at night. DO NOT sleep in your recliner. Long hours of sitting in a recliner leads to swelling of the legs and/or potential wounds on your backside. Wound Treatment Wound #1 - Toe Great  Wound Laterality: Left Cleanser: Byram Ancillary Kit - 15 Day Supply (DME) (Generic) Every Other Day/30 Days Discharge Instructions: Use supplies as instructed; Kit contains: (15) Saline Bullets; (15) 3x3 Gauze; 15 pr Gloves Cleanser: Normal Saline Every Other Day/30 Days Discharge Instructions: Wash your hands with soap and water. Remove old dressing, discard into plastic bag and place into trash. Cleanse the wound with Normal Saline prior to applying a clean dressing using gauze sponges, not tissues or cotton balls. Do not scrub or use excessive force. Pat dry using gauze sponges, not tissue or cotton balls. Primary Dressing: Hydrofera Blue Ready Transfer Foam, 2.5x2.5 (in/in) (DME) (Generic) Every Other Day/30 Days Discharge Instructions: Apply Hydrofera Blue Ready to wound bed as directed Secondary Dressing: Gauze (DME) (Generic) Every Other Day/30 Days Discharge Instructions: As directed: Secured With: 91M [place] Surgical Tape, 2x2 (in/yd) (DME) (Generic) Every Other Day/30 Days Secured With: Conforming Stretch Gauze Bandage 2x75 (in/in) Every Other Day/30 Days Discharge Instructions: knuckle band aid Wound #2 - Toe Second Wound Laterality: Left Cleanser: Byram Ancillary Kit - 15 Day Supply (DME) (Generic) Every Other Day/30 Days Discharge Instructions: Use supplies as instructed; Kit contains: (15) Saline Bullets; (15) 3x3 Gauze; 15 pr Gloves Cleanser: Normal Saline Every Other Day/30 Days Discharge Instructions: Wash your hands with soap and water. Remove old dressing, discard into plastic bag and place into trash. Cleanse the wound with Normal Saline prior to applying a clean dressing using gauze sponges, not tissues or cotton balls. Do not scrub or use excessive force. Pat dry using gauze sponges, not tissue or cotton balls. Primary Dressing: Hydrofera Blue Ready Transfer Foam, 2.5x2.5 (in/in) (DME) (Generic) Every Other Day/30 Days Discharge Instructions: Apply Hydrofera  Blue Ready to wound bed as directed Opiela, Kayren [name] ([id number]) Secondary Dressing: Gauze (DME) (Generic) Every Other Day/30 Days Discharge Instructions: As directed: Secured With: 91M [employer] H Soft Cloth Surgical Tape, 2x2 (in/yd) (DME) (Generic) Every Other Day/30 Days Secured With: Conforming Stretch Gauze Bandage 2x75 (in/in) Every Other Day/30 Days Discharge Instructions: knuckle band aid Patient Medications Allergies: Sulfa (Sulfonamide Antibiotics), castor oil, amoxicillin, Demerol, hydrocodone Notifications Medication Indication Start End doxycycline hyclate 04/20/2021 DOSE 1 - oral 100 mg capsule - 1 capsule oral taken 2 times per day for 14 days Electronic Signature(s) Signed: 04/20/2021 3:25:03 PM By: Worthy Keeler PA-C Previous Signature: 04/17/2021 4:05:24 PM Version By: Worthy Keeler PA-C Previous Signature: 04/17/2021 4:59:38 PM Version By: Levora Dredge Entered By: Worthy Keeler on 04/20/2021 15:25:02 Dupras, Kayren [name] ([id number]) -------------------------------------------------------------------------------- Problem List Details Patient Name: [name] Date of Service: 04/17/2021 12:45 PM  Medical Record Number: [id number] Patient Account Number: [id number] Date of Birth/Sex: 1940/11/17 (81 y.o. 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 04/17/2021 No Yes Z89.421 Acquired absence of other right toe(s) 04/17/2021 No Yes I10 Essential (primary) hypertension 04/17/2021 No Yes Inactive Problems Resolved Problems Electronic  Signature(s) Signed: 04/17/2021 1:22:45 PM By: Worthy Keeler PA-C Entered By: Worthy Keeler on 04/17/2021 13:22:45 Heart, Kayren [name] ([id number]) -------------------------------------------------------------------------------- Progress Note Details Patient Name: [name] Date of Service: 04/17/2021 12:45 PM Medical Record Number: [id number] Patient Account Number: [id number] Date of Birth/Sex: 11/23/1940 (81 y.o. F) Treating RN: Levora Dredge Primary Care Provider: Claris Gower Other Clinician: Referring Provider: Claris Gower Treating Provider/Extender: Skipper Cliche in Treatment: 0 Subjective Chief Complaint Information obtained from Patient Left 1st toe ulcer History of Present Illness (HPI) 04/17/2021 upon evaluation patient appears to be doing somewhat poorly currently in regard to her first and second toe of the left foot. She previously has been seen by Dr. Doran Durand she saw him on 31 January he did not feel there was any evidence of osteomyelitis. He did give her a thorough evaluation including x-rays and showed no abnormal findings according to notes. With that being said he felt like that wound care would be beneficial therefore she contacted [place]. She has currently been using antibiotic ointment and has noted that this wound on the great toe has been there for about a year. She is not certain whether there is anything on the second toe or not she has not really noted any drainage but at the same time she cannot be sure there is nothing hiding up underneath a significant callus here. Her feet really do not have any major complications with regard to pain and that is good news. She does have neuropathy. Patient does have a history of diabetes mellitus type 2, hypertension, and her most recent hemoglobin A1c was 7.6 on June 2022. Patient History Allergies Sulfa (Sulfonamide Antibiotics), castor oil, amoxicillin, Demerol, hydrocodone Social History Former smoker -  quit 2002, Alcohol Use - Never, Drug Use - No History, Caffeine Use - Rarely. Medical History Ear/Nose/Mouth/Throat Patient has history of Chronic sinus problems/congestion Hematologic/Lymphatic Patient has history of Anemia Respiratory Patient has history of Sleep Apnea Cardiovascular Patient has history of Congestive Heart Failure, Hypertension Endocrine Patient has history of Type II Diabetes Neurologic Patient has history of Neuropathy Patient is treated with Oral Agents. Blood sugar is not tested. Medical And Surgical History Notes Musculoskeletal arthritis Review of Systems (ROS) Constitutional Symptoms (General Health) Denies complaints or symptoms of Fatigue, Fever, Chills, Marked Weight Change. Eyes Complains or has symptoms of Glasses / Contacts. Ear/Nose/Mouth/Throat Complains or has symptoms of Difficult clearing ears. Cardiovascular Complains or has symptoms of LE edema. Gastrointestinal Complains or has symptoms of Frequent diarrhea, IBS Genitourinary kidney stone hx Immunological Denies complaints or symptoms of Hives, Itching. Integumentary (Skin) Complains or has symptoms of Wounds. 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 ([id number]) Psychiatric Complains or has symptoms of Anxiety. Objective Constitutional patient is hypertensive.. pulse regular and within target range for patient.Marland Kitchen respirations regular, non-labored and within target range for patient.Marland Kitchen  temperature within target range for patient.. Well-nourished and well-hydrated in no acute distress. Vitals Time Taken: 12:49 PM, Height: 61 in, Source: Stated, Weight: 250 lbs, Source: Stated, BMI: 47.2, Temperature: 97.5 F, Pulse: 76 bpm, Respiratory Rate: 18 breaths/min, Blood Pressure: 153/75 mmHg. Eyes conjunctiva clear no eyelid edema noted. pupils equal round and reactive to light and accommodation. Ears, Nose, Mouth, and Throat no gross abnormality of ear  auricles or external auditory canals. normal hearing noted during conversation. mucus membranes moist. Respiratory normal breathing without difficulty. Cardiovascular 2+ dorsalis pedis/posterior tibialis pulses. no clubbing, cyanosis, significant edema, Musculoskeletal normal gait and posture. no significant deformity or arthritic changes, no loss or range of motion, no clubbing. Psychiatric this patient is able to make decisions and demonstrates good insight into disease process. Alert and Oriented x 3. pleasant and cooperative. General Notes: Upon inspection patient's wound actually showed signs of good fairly good granulation epithelization at this point. Fortunately I do not see any evidence of active infection locally nor systemically at this time which is great news and overall I am very pleased with where things stand. I do think she has a significant amount of callus buildup that we can need to address here. I explained that to the patient today and she is in agreement with undergoing debridement currently. Fortunately I was able to debride away the callus unfortunately she did have an open wound underneath the callus of the second toe as well although this is quite small. Nonetheless I am glad we found it so we get this healed hopefully before it becomes any kind of bigger complication here. Integumentary (Hair, Skin) Wound #1 status is Open. Original cause of wound was Gradually Appeared. The date acquired was: 02/23/2020. The wound is located on the Left Toe Great. The wound measures 2cm length x 3cm width x 0.1cm depth; 4.712cm^2 area and 0.471cm^3 volume. There is no tunneling or undermining noted. There is a medium amount of serosanguineous drainage noted. The wound margin is thickened. There is no granulation within the wound bed. There is a large (67-100%) amount of necrotic tissue within the wound bed including Eschar and Adherent Slough. Wound #2 status is Open. Original cause of  wound was Gradually Appeared. The date acquired was: 04/17/2021. The wound is located on the Left Toe Second. The wound measures 1.2cm length x 1.2cm width x 0.1cm depth; 1.131cm^2 area and 0.113cm^3 volume. There is no tunneling or undermining noted. There is a medium amount of serosanguineous drainage noted. There is no granulation within the wound bed. There is a large (67-100%) amount of necrotic tissue within the wound bed including Eschar and Adherent Slough. 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 ([id number]) Procedures Wound #1 Pre-procedure diagnosis of Wound #1 is a Diabetic Wound/Ulcer of the Lower Extremity located on the Left Toe Great .Severity of Tissue Pre Debridement is: Fat layer exposed. There was a Excisional Skin/Subcutaneous Tissue Debridement with a total area of 6 sq cm performed by Tommie Sams., PA-C. With the following instrument(s): Curette to remove Viable and Non-Viable tissue/material. Material removed includes Callus, Subcutaneous Tissue, and Slough. No specimens were taken. A time out was conducted at 13:38, prior to the start of the procedure. A Moderate amount of bleeding was controlled with Pressure. The procedure was tolerated well. Post  Debridement Measurements: 1.2cm length x 1.3cm width x 0.1cm depth; 0.123cm^3 volume. Character of Wound/Ulcer Post Debridement is stable. Severity of Tissue Post Debridement is: Fat layer exposed. Post procedure Diagnosis Wound #1: Same as Pre-Procedure Wound #2 Pre-procedure diagnosis of Wound #2 is a Diabetic Wound/Ulcer of the Lower Extremity located on the Left Toe Second .Severity of Tissue Pre Debridement is: Fat layer exposed. There was a Excisional Skin/Subcutaneous Tissue  Debridement with a total area of 1.44 sq cm performed by Tommie Sams., PA-C. With the following instrument(s): Curette to remove Viable and Non-Viable tissue/material. Material removed includes Callus, Subcutaneous Tissue, and Slough. No specimens were taken. A time out was conducted at 13:29, prior to the start of the procedure. A Minimum amount of bleeding was controlled with Pressure. The procedure was tolerated well. Post Debridement Measurements: 0.2cm length x 0.3cm width x 0.1cm depth; 0.005cm^3 volume. Character of Wound/Ulcer Post Debridement is stable. Severity of Tissue Post Debridement is: Fat layer exposed. Post procedure Diagnosis Wound #2: Same as Pre-Procedure Plan Follow-up Appointments: Return Appointment in 1 week. Nurse Visit as needed Bathing/ Shower/ Hygiene: Wash wounds with antibacterial soap and water. May shower; gently cleanse wound with antibacterial soap, rinse and pat dry prior to dressing wounds No tub bath. Edema Control - Lymphedema / Segmental Compressive Device / Other: Elevate leg(s) parallel to the floor when sitting. DO YOUR BEST to sleep in the bed at night. DO NOT sleep in your recliner. Long hours of sitting in a recliner leads to swelling of the legs and/or potential wounds on your backside. The following medication(s) was prescribed: doxycycline hyclate oral 100 mg capsule 1 1 capsule oral taken 2 times per day for 14 days starting 04/20/2021 WOUND #1: - Toe Great Wound Laterality: Left Cleanser: Byram Ancillary Kit - 15 Day Supply (DME) (Generic) Every Other Day/30 Days Discharge Instructions: Use supplies as instructed; Kit contains: (15) Saline Bullets; (15) 3x3 Gauze; 15 pr Gloves Cleanser: Normal Saline Every Other Day/30 Days Discharge Instructions: Wash your hands with soap and water. Remove old dressing, discard into plastic bag and place into trash. Cleanse the wound with Normal Saline prior to applying a clean dressing using gauze  sponges, not tissues or cotton balls. Do not scrub or use excessive force. Pat dry using gauze sponges, not tissue or cotton balls. Primary Dressing: Hydrofera Blue Ready Transfer Foam, 2.5x2.5 (in/in) (DME) (Generic) Every Other Day/30 Days Discharge Instructions: Apply Hydrofera Blue Ready to wound bed as directed Secondary Dressing: Gauze (DME) (Generic) Every Other Day/30 Days Discharge Instructions: As directed: Secured With: 23M [place] Surgical Tape, 2x2 (in/yd) (DME) (Generic) Every Other Day/30 Days Secured With: Conforming Stretch Gauze Bandage 2x75 (in/in) Every Other Day/30 Days Discharge Instructions: knuckle band aid WOUND #2: - Toe Second Wound Laterality: Left Cleanser: Byram Ancillary Kit - 15 Day Supply (DME) (Generic) Every Other Day/30 Days Discharge Instructions: Use supplies as instructed; Kit contains: (15) Saline Bullets; (15) 3x3 Gauze; 15 pr Gloves Cleanser: Normal Saline Every Other Day/30 Days Discharge Instructions: Wash your hands with soap and water. Remove old dressing, discard into plastic bag and place into trash. Cleanse the wound with Normal Saline prior to applying a clean dressing using gauze sponges, not tissues or cotton balls. Do not scrub or use excessive force. Pat dry using gauze sponges, not tissue or cotton balls. Primary Dressing: Hydrofera Blue Ready Transfer Foam, 2.5x2.5 (in/in) (DME) (Generic) Every Other Day/30 Days Discharge Instructions: Apply Hydrofera Blue Ready to wound bed as  directed Secondary Dressing: Gauze (DME) (Generic) Every Other Day/30 Days Discharge Instructions: As directed: Secured With: 76M Medipore H Soft Cloth Surgical Tape, 2x2 (in/yd) (DME) (Generic) Every Other Day/30 Days Secured With: Conforming Stretch Gauze Bandage 2x75 (in/in) Every Other Day/30 Days Discharge Instructions: knuckle band aid Alberts, Makia A. ([id number]) 1. Would recommend currently that we going continue with the wound care measures  as before and the patient is in agreement with plan. This includes the use of the Hydrofera Blue to both wounds which I think is good to be appropriate. 2. We will secure this with gauze and roll gauze to hold in place. 3. I am also can recommend the patient should continue to avoid any excessive pressure to her foot. We may need to put her in an offloading sandal though for now she tells me she does have shoes at home which she feels like will help keep the pressure off. Nonetheless we will see how things look next week and make a determination following. We will see patient back for reevaluation in 1 week here in the clinic. If anything worsens or changes patient will contact our office for additional recommendations. Electronic Signature(s) Signed: 04/20/2021 3:25:43 PM By: Worthy Keeler PA-C Previous Signature: 04/17/2021 2:32:24 PM Version By: Worthy Keeler PA-C Entered By: Worthy Keeler on 04/20/2021 15:25:42 Dennin, Kayren [name] ([id number]) -------------------------------------------------------------------------------- ROS/PFSH Details Patient Name: [name] Date of Service: 04/17/2021 12:45 PM Medical Record Number: [id number] Patient Account Number: [id number] Date of Birth/Sex: 1940-11-23 (81 y.o. F) Treating RN: Levora Dredge Primary Care Provider: Claris Gower Other Clinician: Referring Provider: Claris Gower Treating Provider/Extender: Skipper Cliche in Treatment: 0 Constitutional Symptoms (General Health) Complaints and Symptoms: Negative for: Fatigue; Fever; Chills; Marked Weight Change Eyes Complaints and Symptoms: Positive for: Glasses / Contacts Ear/Nose/Mouth/Throat Complaints and Symptoms: Positive for: Difficult clearing ears Medical History: Positive for: Chronic sinus problems/congestion Cardiovascular Complaints and Symptoms: Positive for: LE edema Medical History: Positive for: Congestive Heart Failure;  Hypertension Gastrointestinal Complaints and Symptoms: Positive for: Frequent diarrhea Review of System Notes: IBS Immunological Complaints and Symptoms: Negative for: Hives; Itching Integumentary (Skin) Complaints and Symptoms: Positive for: Wounds Psychiatric Complaints and Symptoms: Positive for: Anxiety Hematologic/Lymphatic Medical History: Positive for: Anemia Respiratory Medical History: Positive for: Sleep Apnea Takeda, ALYZE LAUF ([id number]) Endocrine Medical History: Positive for: Type II Diabetes Time with diabetes: 18 years Treated with: Oral agents Blood sugar tested every day: No Genitourinary Complaints and Symptoms: Review of System Notes: kidney stone hx Musculoskeletal Complaints and Symptoms: Review of System Notes: chronic back pain Medical History: Past Medical History Notes: arthritis Neurologic Medical History: Positive for: Neuropathy Oncologic Complaints and Symptoms: Review of System Notes: basal skin ca HBO Extended History Items Ear/Nose/Mouth/Throat: Chronic sinus problems/congestion Immunizations Pneumococcal Vaccine: Received Pneumococcal Vaccination: Yes Received Pneumococcal Vaccination On or After 60th Birthday: Yes Implantable Devices None Family and Social History Former smoker - quit 2002; Alcohol Use: Never; Drug Use: No History; Caffeine Use: Rarely Electronic Signature(s) Signed: 04/17/2021 4:05:24 PM By: Worthy Keeler PA-C Signed: 04/17/2021 4:59:38 PM By: Levora Dredge Entered By: Levora Dredge on 04/17/2021 12:59:10 Veiga, Kayren [name] ([id number]) -------------------------------------------------------------------------------- SuperBill Details Patient Name: [name] Date of Service: 04/17/2021 Medical Record Number: [id number] Patient Account Number: [id number] Date of Birth/Sex: Jul 13, 1940 (81 y.o. 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 Z89.421 Acquired absence of other right toe(s) I10 Essential (primary) hypertension Facility Procedures CPT4 Code: 95396728 Description: 773 [id number] 0058 - WOUND CARE VISIT-LEV 2 EST PT Modifier: Quantity: 1 CPT4 Code: 04136438 Description: 37793 - DEB SUBQ TISSUE 20 SQ CM/< Modifier: Quantity: 1 CPT4 Code: Description: ICD-10 Diagnosis Description L97.522 Non-pressure chronic ulcer of other part of left foot with fat layer expo Modifier: sed Quantity: Physician Procedures CPT4 Code: 9688648 Description: 47207 - WC PHYS LEVEL 4 - NEW PT Modifier: 25 Quantity: 1 CPT4 Code: Description: ICD-10 Diagnosis Description E11.621 Type 2 diabetes mellitus with foot ulcer L97.522 Non-pressure chronic ulcer of other part of left foot with fat layer exp 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

## 2021-04-23 ENCOUNTER — Ambulatory Visit: Payer: Medicare Other | Admitting: Physician Assistant

## 2021-05-01 ENCOUNTER — Ambulatory Visit: Payer: Medicare Other | Admitting: Internal Medicine

## 2021-05-08 ENCOUNTER — Encounter: Payer: Medicare Other | Attending: Physician Assistant | Admitting: Physician Assistant

## 2021-05-08 ENCOUNTER — Other Ambulatory Visit: Payer: Self-pay

## 2021-05-08 DIAGNOSIS — E1140 Type 2 diabetes mellitus with diabetic neuropathy, unspecified: Secondary | ICD-10-CM | POA: Insufficient documentation

## 2021-05-08 DIAGNOSIS — I110 Hypertensive heart disease with heart failure: Secondary | ICD-10-CM | POA: Diagnosis not present

## 2021-05-08 DIAGNOSIS — Z89411 Acquired absence of right great toe: Secondary | ICD-10-CM | POA: Insufficient documentation

## 2021-05-08 DIAGNOSIS — E11621 Type 2 diabetes mellitus with foot ulcer: Secondary | ICD-10-CM | POA: Diagnosis present

## 2021-05-08 DIAGNOSIS — Z89421 Acquired absence of other right toe(s): Secondary | ICD-10-CM | POA: Insufficient documentation

## 2021-05-08 DIAGNOSIS — I509 Heart failure, unspecified: Secondary | ICD-10-CM | POA: Insufficient documentation

## 2021-05-08 DIAGNOSIS — L97522 Non-pressure chronic ulcer of other part of left foot with fat layer exposed: Secondary | ICD-10-CM | POA: Diagnosis not present

## 2021-05-08 NOTE — Progress Notes (Signed)
Endres, [name] ([id number]) ?Visit Report for 05/08/2021 ?Arrival Information Details ?Patient Name: [name] ?Date of Service: 05/08/2021 3:00 PM ?Medical Record Number: [id number] ?Patient Account Number: [id number] ?Date of Birth/Sex: [birth year] (81 y.o. F) ?Treating RN: Levora Dredge ?Primary Care Eithen Castiglia: Claris Gower Other Clinician: ?Referring Korrine Sicard: Claris Gower ?Treating Idalis Hoelting/Extender: Jeri Cos ?Weeks in Treatment: 3 ?Visit Information History Since Last Visit ?Added or deleted any medications: No ?Patient Arrived: [name] ?Any new allergies or adverse reactions: No ?Arrival Time: 15:27 ?Had a fall or experienced change in No ?Accompanied By: self ?activities of daily living that may affect ?Transfer Assistance: None ?risk of falls: ?Patient Identification Verified: Yes ?Hospitalized since last visit: No ?Secondary Verification Process Completed: Yes ?Has Dressing in Place as Prescribed: Yes ?Pain Present Now: Yes ?Electronic Signature(s) ?Signed: 05/08/2021 4:12:12 PM By: Levora Dredge ?Entered By: Levora Dredge on 05/08/2021 15:31:07 ?Venters, [name] ([id number]) ?-------------------------------------------------------------------------------- ?Clinic Level of Care Assessment Details ?Patient Name: [name] ?Date of Service: 05/08/2021 3:00 PM ?Medical Record Number: [id number] ?Patient Account Number: [id number] ?Date of Birth/Sex: May 23, 1940 (81 y.o. F) ?Treating RN: Levora Dredge ?Primary Care Alicya Bena: Claris Gower Other Clinician: ?Referring Chey Rachels: Claris Gower ?Treating Lukus Binion/Extender: Jeri Cos ?Weeks in Treatment: 3 ?Clinic Level of Care Assessment Items ?TOOL 1 Quantity Score ?'[]'$  - Use when EandM and Procedure is performed on INITIAL visit 0 ?ASSESSMENTS - Nursing Assessment / Reassessment ?'[]'$  - General Physical Exam (combine w/ comprehensive assessment (listed just below) when performed on new ?0 ?pt. evals) ?'[]'$  - 0 ?Comprehensive Assessment (HX, ROS, Risk  Assessments, Wounds Hx, etc.) ?ASSESSMENTS - Wound and Skin Assessment / Reassessment ?'[]'$  - Dermatologic / Skin Assessment (not related to wound area) 0 ?ASSESSMENTS - Ostomy and/or Continence Assessment and Care ?'[]'$  - Incontinence Assessment and Management 0 ?'[]'$  - 0 ?Ostomy Care Assessment and Management (repouching, etc.) ?PROCESS - Coordination of Care ?'[]'$  - Simple Patient / Family Education for ongoing care 0 ?'[]'$  - 0 ?Complex (extensive) Patient / Family Education for ongoing care ?'[]'$  - 0 ?Staff obtains Consents, Records, Test Results / Process Orders ?'[]'$  - 0 ?Staff telephones HHA, Nursing Homes / Clarify orders / etc ?'[]'$  - 0 ?Routine Transfer to another Facility (non-emergent condition) ?'[]'$  - 0 ?Routine Hospital Admission (non-emergent condition) ?'[]'$  - 0 ?New Admissions / Biomedical engineer / Ordering NPWT, Apligraf, etc. ?'[]'$  - 0 ?Emergency Hospital Admission (emergent condition) ?PROCESS - Special Needs ?'[]'$  - Pediatric / Minor Patient Management 0 ?'[]'$  - 0 ?Isolation Patient Management ?'[]'$  - 0 ?Hearing / Language / Visual special needs ?'[]'$  - 0 ?Assessment of Community assistance (transportation, D/C planning, etc.) ?'[]'$  - 0 ?Additional assistance / Altered mentation ?'[]'$  - 0 ?Support Surface(s) Assessment (bed, cushion, seat, etc.) ?INTERVENTIONS - Miscellaneous ?'[]'$  - External ear exam 0 ?'[]'$  - 0 ?Patient Transfer (multiple staff / Civil Service fast streamer / Similar devices) ?'[]'$  - 0 ?Simple Staple / Suture removal (25 or less) ?'[]'$  - 0 ?Complex Staple / Suture removal (26 or more) ?'[]'$  - 0 ?Hypo/Hyperglycemic Management (do not check if billed separately) ?'[]'$  - 0 ?Ankle / Brachial Index (ABI) - do not check if billed separately ?Has the patient been seen at the hospital within the last three years: Yes ?Total Score: 0 ?Level Of Care: ____ ?F[place]o, [name] ([id number]) ?Electronic Signature(s) ?Signed: 05/08/2021 4:12:12 PM By: Levora Dredge ?Entered By: Levora Dredge on 05/08/2021 15:58:37 ?[place]ger, [name]  ([id number]) ?-------------------------------------------------------------------------------- ?Encounter Discharge Information Details ?Patient Name: [name] ?Date of Service: 05/08/2021 3:00 PM ?Medical Record Number:  [id number] ?Patient Account Number: [id number] ?Date of Birth/Sex: [birth year] (81 y.o. F) ?Treating RN: Levora Dredge ?Primary Care Aavya Shafer: Claris Gower Other Clinician: ?Referring Peggy Monk: Claris Gower ?Treating Javan Gonzaga/Extender: Jeri Cos ?Weeks in Treatment: 3 ?Encounter Discharge Information Items Post Procedure Vitals ?Discharge Condition: Stable ?Temperature (?F): 98 ?Ambulatory Status: [name] ?Pulse (bpm): 82 ?Discharge Destination: Home ?Respiratory Rate (breaths/min): 18 ?Transportation: Other ?Blood Pressure (mmHg): 156/79 ?Accompanied By: self ?Schedule Follow-up Appointment: Yes ?Clinical Summary of Care: ?Electronic Signature(s) ?Signed: 05/08/2021 4:12:12 PM By: Levora Dredge ?Entered By: Levora Dredge on 05/08/2021 15:59:51 ?Kye, [name] ([id number]) ?-------------------------------------------------------------------------------- ?Lower Extremity Assessment Details ?Patient Name: [name] ?Date of Service: 05/08/2021 3:00 PM ?Medical Record Number: [id number] ?Patient Account Number: [id number] ?Date of Birth/Sex: 1941/02/12 (81 y.o. F) ?Treating RN: Levora Dredge ?Primary Care Rayn Shorb: Claris Gower Other Clinician: ?Referring Stanlee Roehrig: Claris Gower ?Treating Mister Krahenbuhl/Extender: Jeri Cos ?Weeks in Treatment: 3 ?Edema Assessment ?Assessed: [Left: No] [Right: No] ?Edema: [Left: Ye] [Right: s] ?Calf ?Left: Right: ?Point of Measurement: 30 cm From Medial Instep 40.5 cm ?Ankle ?Left: Right: ?Point of Measurement: 10 cm From Medial Instep 28.3 cm ?Vascular Assessment ?Pulses: ?Dorsalis Pedis ?Palpable: [Left:Yes] ?Electronic Signature(s) ?Signed: 05/08/2021 4:12:12 PM By: Levora Dredge ?Entered By: Levora Dredge on 05/08/2021 15:38:42 ?[place], [name]  ([id number]) ?-------------------------------------------------------------------------------- ?Multi Wound Chart Details ?Patient Name: [name] ?Date of Service: 05/08/2021 3:00 PM ?Medical Record Number: [id number] ?Patient Account Number: [id number] ?Date of Birth/Sex: 10-14-1940 (81 y.o. F) ?Treating RN: Levora Dredge ?Primary Care Escarlet Saathoff: Claris Gower Other Clinician: ?Referring Karis Rilling: Claris Gower ?Treating Cortlynn Hollinsworth/Extender: Jeri Cos ?Weeks in Treatment: 3 ?Vital Signs ?Height(in): 61 ?Pulse(bpm): 82 ?Weight(lbs): 250 ?Blood Pressure(mmHg): 156/79 ?Body Mass Index(BMI): 47.2 ?Temperature(??F): 98 ?Respiratory Rate(breaths/min): 18 ?Photos: [N/A:N/A] ?Wound Location: Left Toe Great Left Toe Second N/A ?Wounding Event: Gradually Appeared Gradually Appeared N/A ?Primary Etiology: Diabetic Wound/Ulcer of the Lower Diabetic Wound/Ulcer of the Lower N/A ?Extremity Extremity ?Comorbid History: Chronic sinus problems/congestion, Chronic sinus problems/congestion, N/A ?Anemia, Sleep Apnea, Congestive Anemia, Sleep Apnea, Congestive ?Heart Failure, Hypertension, Type II Heart Failure, Hypertension, Type II ?Diabetes, Neuropathy Diabetes, Neuropathy ?Date Acquired: 02/23/2020 04/17/2021 N/A ?Weeks of Treatment: 3 3 N/A ?Wound Status: Open Open N/A ?Wound Recurrence: No No N/A ?Measurements L x W x D (cm) 1x0.6x0.1 0.6x0.3x0.1 N/A ?Area (cm?) : 0.471 0.141 N/A ?Volume (cm?) : 0.047 0.014 N/A ?% Reduction in Area: 90.00% 87.50% N/A ?% Reduction in Volume: 90.00% 87.60% N/A ?Classification: Grade 1 Grade 2 N/A ?Exudate Amount: Medium Medium N/A ?Exudate Type: Serosanguineous Serosanguineous N/A ?Exudate Color: red, brown red, brown N/A ?Wound Margin: Thickened N/A N/A ?Granulation Amount: Medium (34-66%) Small (1-33%) N/A ?Granulation Quality: Pink, Pale Pink N/A ?Necrotic Amount: Medium (34-66%) Large (67-100%) N/A ?Necrotic Tissue: Eschar, Adherent [place] N/A ?Exposed  Structures: ?Fat Layer (Subcutaneous Tissue): Fat Layer (Subcutaneous Tissue): N/A ?Yes Yes ?Epithelialization: None None N/A ?Treatment Notes ?Electronic Signature(s) ?Signed: 05/08/2021 4:12:12 PM By: Levora Dredge ?Entered By

## 2021-05-08 NOTE — Progress Notes (Addendum)
Jeter, [name] ([id number]) ?Visit Report for 05/08/2021 ?Chief Complaint Document Details ?Patient Name: [name] ?Date of Service: 05/08/2021 3:00 PM ?Medical Record Number: [id number] ?Patient Account Number: [id number] ?Date of Birth/Sex: 02-15-1941 (81 y.o. F) ?Treating RN: Levora Dredge ?Primary Care Provider: Claris Gower Other Clinician: ?Referring Provider: Claris Gower ?Treating Provider/Extender: Jeri Cos ?Weeks in Treatment: 3 ?Information Obtained from: Patient ?Chief Complaint ?Left 1st toe ulcer ?Electronic Signature(s) ?Signed: 05/08/2021 3:37:37 PM By: Worthy Keeler PA-C ?Entered By: Worthy Keeler on 05/08/2021 15:37:36 ?Curl, [name] ([id number]) ?-------------------------------------------------------------------------------- ?Debridement Details ?Patient Name: [name] ?Date of Service: 05/08/2021 3:00 PM ?Medical Record Number: [id number] ?Patient Account Number: [id number] ?Date of Birth/Sex: 04-24-1940 (81 y.o. F) ?Treating RN: Levora Dredge ?Primary Care Provider: Claris Gower Other Clinician: ?Referring Provider: Claris Gower ?Treating Provider/Extender: Jeri Cos ?Weeks in Treatment: 3 ?Debridement Performed for ?Wound #2 Left Toe Second ?Assessment: ?Performed By: Physician Tommie Sams., PA-C ?Debridement Type: Debridement ?Severity of Tissue Pre Debridement: Fat layer exposed ?Level of Consciousness (Pre- ?Awake and Alert ?procedure): ?Pre-procedure Verification/Time Out ?Yes - 15:52 ?Taken: ?Total Area Debrided (L x W): 0.6 (cm) x 0.3 (cm) = 0.18 (cm?) ?Tissue and other material ?Viable, Non-Viable, Callus, Slough, Subcutaneous, [place] ?debrided: ?Level: Skin/Subcutaneous Tissue ?Debridement Description: Excisional ?Instrument: Curette ?Bleeding: Moderate ?Hemostasis Achieved: Pressure ?Response to Treatment: Procedure was tolerated well ?Level of Consciousness (Post- ?Awake and Alert ?procedure): ?Post Debridement Measurements of Total Wound ?Length: (cm)  0.6 ?Width: (cm) 0.3 ?Depth: (cm) 0.1 ?Volume: (cm?) 0.014 ?Character of Wound/Ulcer Post Debridement: Stable ?Severity of Tissue Post Debridement: Fat layer exposed ?Post Procedure Diagnosis ?Same as Pre-procedure ?Electronic Signature(s) ?Signed: 05/08/2021 4:12:12 PM By: Levora Dredge ?Signed: 05/08/2021 5:45:40 PM By: Worthy Keeler PA-C ?Entered By: Levora Dredge on 05/08/2021 15:55:52 ?[place], [name] ([id number]) ?-------------------------------------------------------------------------------- ?Debridement Details ?Patient Name: [name] ?Date of Service: 05/08/2021 3:00 PM ?Medical Record Number: [id number] ?Patient Account Number: [id number] ?Date of Birth/Sex: [birth year] (81 y.o. F) ?Treating RN: Levora Dredge ?Primary Care Provider: Claris Gower Other Clinician: ?Referring Provider: Claris Gower ?Treating Provider/Extender: Jeri Cos ?Weeks in Treatment: 3 ?Debridement Performed for ?Wound #1 Left Toe Great ?Assessment: ?Performed By: Physician Tommie Sams., PA-C ?Debridement Type: Debridement ?Severity of Tissue Pre Debridement: Fat layer exposed ?Level of Consciousness (Pre- ?Awake and Alert ?procedure): ?Pre-procedure Verification/Time Out ?Yes - 15:52 ?Taken: ?Total Area Debrided (L x W): 1 (cm) x 0.6 (cm) = 0.6 (cm?) ?Tissue and other material ?Viable, Non-Viable, Callus, Slough, Subcutaneous, [place] ?debrided: ?Level: Skin/Subcutaneous Tissue ?Debridement Description: Excisional ?Instrument: Curette ?Bleeding: Moderate ?Hemostasis Achieved: Pressure ?Response to Treatment: Procedure was tolerated well ?Level of Consciousness (Post- ?Awake and Alert ?procedure): ?Post Debridement Measurements of Total Wound ?Length: (cm) 1 ?Width: (cm) 0.6 ?Depth: (cm) 0.1 ?Volume: (cm?) 0.047 ?Character of Wound/Ulcer Post Debridement: Stable ?Severity of Tissue Post Debridement: Fat layer exposed ?Post Procedure Diagnosis ?Same as Pre-procedure ?Electronic Signature(s) ?Signed: 05/08/2021 4:12:12 PM  By: Levora Dredge ?Signed: 05/08/2021 5:45:40 PM By: Worthy Keeler PA-C ?Entered By: Levora Dredge on 05/08/2021 15:57:53 ?Bass, [name] ([id number]) ?-------------------------------------------------------------------------------- ?HPI Details ?Patient Name: [name] ?Date of Service: 05/08/2021 3:00 PM ?Medical Record Number: [id number] ?Patient Account Number: [id number] ?Date of Birth/Sex: 1940-05-25 (81 y.o. F) ?Treating RN: Levora Dredge ?Primary Care Provider: Claris Gower Other Clinician: ?Referring Provider: Claris Gower ?Treating Provider/Extender: Jeri Cos ?Weeks in Treatment: 3 ?History of Present Illness ?HPI Description: 04/17/2021 upon evaluation patient appears to be doing somewhat poorly currently in regard to her first and second  toe of the ?left foot. She previously has been seen by Dr. Doran Durand she saw him on 31 January he did not feel there was any evidence of osteomyelitis. He did ?give her a thorough evaluation including x-rays and showed no abnormal findings according to notes. With that being said he felt like that wound ?care would be beneficial therefore she contacted [place]. She has currently been using antibiotic ointment and has noted that this wound on the great ?toe has been there for about a year. She is not certain whether there is anything on the second toe or not she has not really noted any drainage ?but at the same time she cannot be sure there is nothing hiding up underneath a significant callus here. Her feet really do not have any major ?complications with regard to pain and that is good news. She does have neuropathy. ?Patient does have a history of diabetes mellitus type 2, hypertension, and her most recent hemoglobin A1c was 7.6 on June 2022. ?05/08/2021 upon evaluation today patient appears to be doing poorly in regard to her toes unfortunately she has a lot of callus buildup. Its been ?almost a month since I last saw her. Obviously in this amount of time she  has built up quite a bit of callus she was sick that is the reason she did ?not make it in. Nonetheless I do think that weekly visits is probably can be the best thing is asking to help to allow these areas to heal much more ?effectively and quickly. Fortunately I do not see any evidence of active infection locally or systemically which is great news. ?Electronic Signature(s) ?Signed: 05/08/2021 4:59:27 PM By: Worthy Keeler PA-C ?Entered By: Worthy Keeler on 05/08/2021 16:59:27 ?Kohen, [name] ([id number]) ?-------------------------------------------------------------------------------- ?Physical Exam Details ?Patient Name: [name] ?Date of Service: 05/08/2021 3:00 PM ?Medical Record Number: [id number] ?Patient Account Number: [id number] ?Date of Birth/Sex: April 09, 1940 (81 y.o. F) ?Treating RN: Levora Dredge ?Primary Care Provider: Claris Gower Other Clinician: ?Referring Provider: Claris Gower ?Treating Provider/Extender: Jeri Cos ?Weeks in Treatment: 3 ?Constitutional ?Well-nourished and well-hydrated in no acute distress. ?Respiratory ?normal breathing without difficulty. ?Psychiatric ?this patient is able to make decisions and demonstrates good insight into disease process. Alert and Oriented x 3. pleasant and cooperative. ?Notes ?Upon inspection patient's wound bed actually showed signs again of significant callus buildup I did have to perform a quite extensive debridement ?on both the first and second toe to clear with, she tolerated that today without complication postdebridement wound bed appears to be doing ?much better which is great news. ?Electronic Signature(s) ?Signed: 05/08/2021 5:00:05 PM By: Worthy Keeler PA-C ?Previous Signature: 05/08/2021 4:59:46 PM Version By: Worthy Keeler PA-C ?Entered By: Worthy Keeler on 05/08/2021 17:00:05 ?Gasparini, [name] ([id number]) ?-------------------------------------------------------------------------------- ?Physician Orders Details ?Patient  Name: [name] ?Date of Service: 05/08/2021 3:00 PM ?Medical Record Number: [id number] ?Patient Account Number: [id number] ?Date of Birth/Sex: 20-Feb-1941 (81 y.o. F) ?Treating RN: Levora Dredge

## 2021-05-15 ENCOUNTER — Encounter: Payer: Medicare Other | Admitting: Physician Assistant

## 2021-05-15 ENCOUNTER — Other Ambulatory Visit: Payer: Self-pay

## 2021-05-15 DIAGNOSIS — E11621 Type 2 diabetes mellitus with foot ulcer: Secondary | ICD-10-CM | POA: Diagnosis not present

## 2021-05-15 NOTE — Progress Notes (Addendum)
Debruler, [name] ([id number]) ?Visit Report for 05/15/2021 ?Chief Complaint Document Details ?Patient Name: [name] ?Date of Service: 05/15/2021 3:00 PM ?Medical Record Number: [id number] ?Patient Account Number: [id number] ?Date of Birth/Sex: 02-04-1941 (81 y.o. F) ?Treating RN: Levora Dredge ?Primary Care Provider: Claris Gower Other Clinician: ?Referring Provider: Claris Gower ?Treating Provider/Extender: Jeri Cos ?Weeks in Treatment: 4 ?Information Obtained from: Patient ?Chief Complaint ?Left 1st toe ulcer ?Electronic Signature(s) ?Signed: 05/15/2021 3:13:46 PM By: Worthy Keeler PA-C ?Entered By: Worthy Keeler on 05/15/2021 15:13:46 ?Hammerschmidt, [name] ([id number]) ?-------------------------------------------------------------------------------- ?Debridement Details ?Patient Name: [name] ?Date of Service: 05/15/2021 3:00 PM ?Medical Record Number: [id number] ?Patient Account Number: [id number] ?Date of Birth/Sex: 1940/05/12 (81 y.o. F) ?Treating RN: Levora Dredge ?Primary Care Provider: Claris Gower Other Clinician: ?Referring Provider: Claris Gower ?Treating Provider/Extender: Jeri Cos ?Weeks in Treatment: 4 ?Debridement Performed for ?Wound #1 Left Toe Great ?Assessment: ?Performed By: Physician Tommie Sams., PA-C ?Debridement Type: Debridement ?Severity of Tissue Pre Debridement: Fat layer exposed ?Level of Consciousness (Pre- ?Awake and Alert ?procedure): ?Pre-procedure Verification/Time Out ?Yes - 15:40 ?Taken: ?Total Area Debrided (L x W): 0.6 (cm) x 0.4 (cm) = 0.24 (cm?) ?Tissue and other material ?Viable, Non-Viable, Callus, Slough, Subcutaneous, [place] ?debrided: ?Level: Skin/Subcutaneous Tissue ?Debridement Description: Excisional ?Instrument: Curette ?Bleeding: Minimum ?Hemostasis Achieved: Pressure ?Response to Treatment: Procedure was tolerated well ?Level of Consciousness (Post- ?Awake and Alert ?procedure): ?Post Debridement Measurements of Total Wound ?Length: (cm)  0.6 ?Width: (cm) 0.4 ?Depth: (cm) 0.1 ?Volume: (cm?) 0.019 ?Character of Wound/Ulcer Post Debridement: Stable ?Severity of Tissue Post Debridement: Fat layer exposed ?Post Procedure Diagnosis ?Same as Pre-procedure ?Electronic Signature(s) ?Signed: 05/15/2021 4:31:50 PM By: Levora Dredge ?Signed: 05/15/2021 5:58:01 PM By: Worthy Keeler PA-C ?Entered By: Levora Dredge on 05/15/2021 15:43:29 ?Fretwell, [name] ([id number]) ?-------------------------------------------------------------------------------- ?Debridement Details ?Patient Name: [name] ?Date of Service: 05/15/2021 3:00 PM ?Medical Record Number: [id number] ?Patient Account Number: [id number] ?Date of Birth/Sex: 1940-10-07 (81 y.o. F) ?Treating RN: Levora Dredge ?Primary Care Provider: Claris Gower Other Clinician: ?Referring Provider: Claris Gower ?Treating Provider/Extender: Jeri Cos ?Weeks in Treatment: 4 ?Debridement Performed for ?Wound #2 Left Toe Second ?Assessment: ?Performed By: Physician Tommie Sams., PA-C ?Debridement Type: Debridement ?Severity of Tissue Pre Debridement: Fat layer exposed ?Level of Consciousness (Pre- ?Awake and Alert ?procedure): ?Pre-procedure Verification/Time Out ?Yes - 15:40 ?Taken: ?Total Area Debrided (L x W): 0.2 (cm) x 0.3 (cm) = 0.06 (cm?) ?Tissue and other material ?Viable, Non-Viable, Callus, Slough, Subcutaneous, [place] ?debrided: ?Level: Skin/Subcutaneous Tissue ?Debridement Description: Excisional ?Instrument: Curette ?Bleeding: Minimum ?Hemostasis Achieved: Pressure ?Response to Treatment: Procedure was tolerated well ?Level of Consciousness (Post- ?Awake and Alert ?procedure): ?Post Debridement Measurements of Total Wound ?Length: (cm) 0.2 ?Width: (cm) 0.3 ?Depth: (cm) 0.1 ?Volume: (cm?) 0.005 ?Character of Wound/Ulcer Post Debridement: Stable ?Severity of Tissue Post Debridement: Fat layer exposed ?Post Procedure Diagnosis ?Same as Pre-procedure ?Electronic Signature(s) ?Signed: 05/15/2021  4:31:50 PM By: Levora Dredge ?Signed: 05/15/2021 5:58:01 PM By: Worthy Keeler PA-C ?Entered By: Levora Dredge on 05/15/2021 15:43:39 ?Leitz, [name] ([id number]) ?-------------------------------------------------------------------------------- ?HPI Details ?Patient Name: [name] ?Date of Service: 05/15/2021 3:00 PM ?Medical Record Number: [id number] ?Patient Account Number: [id number] ?Date of Birth/Sex: 04/07/1940 (81 y.o. F) ?Treating RN: Levora Dredge ?Primary Care Provider: Claris Gower Other Clinician: ?Referring Provider: Claris Gower ?Treating Provider/Extender: Jeri Cos ?Weeks in Treatment: 4 ?History of Present Illness ?HPI Description: 04/17/2021 upon evaluation patient appears to be doing somewhat poorly currently in regard to her first and second  toe of the ?left foot. She previously has been seen by Dr. Doran Durand she saw him on 31 January he did not feel there was any evidence of osteomyelitis. He did ?give her a thorough evaluation including x-rays and showed no abnormal findings according to notes. With that being said he felt like that wound ?care would be beneficial therefore she contacted [place]. She has currently been using antibiotic ointment and has noted that this wound on the great ?toe has been there for about a year. She is not certain whether there is anything on the second toe or not she has not really noted any drainage ?but at the same time she cannot be sure there is nothing hiding up underneath a significant callus here. Her feet really do not have any major ?complications with regard to pain and that is good news. She does have neuropathy. ?Patient does have a history of diabetes mellitus type 2, hypertension, and her most recent hemoglobin A1c was 7.6 on June 2022. ?05/08/2021 upon evaluation today patient appears to be doing poorly in regard to her toes unfortunately she has a lot of callus buildup. Its been ?almost a month since I last saw her. Obviously in this amount  of time she has built up quite a bit of callus she was sick that is the reason she did ?not make it in. Nonetheless I do think that weekly visits is probably can be the best thing is asking to help to allow these areas to heal much more ?effectively and quickly. Fortunately I do not see any evidence of active infection locally or systemically which is great news. ?05/15/2021 upon evaluation today patient appears to be doing well currently in regard to her wound. Overall I think that we are definitely headed ?in the right direction with regard to the toes. Fortunately I do not see any signs of active infection locally or systemically at this time which is ?great news. No fevers, chills, nausea, vomiting, or diarrhea. ?Electronic Signature(s) ?Signed: 05/15/2021 4:38:51 PM By: Worthy Keeler PA-C ?Entered By: Worthy Keeler on 05/15/2021 16:38:50 ?[place], [name] ([id number]) ?-------------------------------------------------------------------------------- ?Physical Exam Details ?Patient Name: [name] ?Date of Service: 05/15/2021 3:00 PM ?Medical Record Number: [id number] ?Patient Account Number: [id number] ?Date of Birth/Sex: 02/22/1941 (81 y.o. F) ?Treating RN: Levora Dredge ?Primary Care Provider: Claris Gower Other Clinician: ?Referring Provider: Claris Gower ?Treating Provider/Extender: Jeri Cos ?Weeks in Treatment: 4 ?Constitutional ?Well-nourished and well-hydrated in no acute distress. ?Respiratory ?normal breathing without difficulty. ?Psychiatric ?this patient is able to make decisions and demonstrates good insight into disease process. Alert and Oriented x 3. pleasant and cooperative. ?Notes ?Patient's wound bed showed evidence of good granulation and epithelization at this point. Fortunately I do not see any evidence of active infection ?at this time which is great I did perform sharp debridement to clear with some of the necrotic debris and the patient tolerated this without ?complication.  Fortunately I think that we are headed in the appropriate direction when it comes to the overall healing here. ?Electronic Signature(s) ?Signed: 05/15/2021 4:44:52 PM By: Worthy Keeler PA-C ?Entered By: Bernie Covey

## 2021-05-15 NOTE — Progress Notes (Signed)
[name], [name] ([id number]) ?Visit Report for 05/15/2021 ?Arrival Information Details ?Patient Name: [name] ?Date of Service: 05/15/2021 3:00 PM ?Medical Record Number: [id number] ?Patient Account Number: [id number] ?Date of Birth/Sex: [birth year] (81 y.o. F) ?Treating RN: Levora Dredge ?Primary Care Virdie Penning: Claris Gower Other Clinician: ?Referring Bryce Cheever: Claris Gower ?Treating Britny Riel/Extender: Jeri Cos ?Weeks in Treatment: 4 ?Visit Information History Since Last Visit ?Added or deleted any medications: No ?Patient Arrived: [name] ?Any new allergies or adverse reactions: No ?Arrival Time: 15:02 ?Had a fall or experienced change in No ?Accompanied By: self ?activities of daily living that may affect ?Transfer Assistance: None ?risk of falls: ?Patient Identification Verified: Yes ?Hospitalized since last visit: No ?Secondary Verification Process Completed: Yes ?Has Dressing in Place as Prescribed: Yes ?Pain Present Now: Yes ?Electronic Signature(s) ?Signed: 05/15/2021 4:31:50 PM By: Levora Dredge ?Entered By: Levora Dredge on 05/15/2021 15:02:54 ?[name], [name] ([id number]) ?-------------------------------------------------------------------------------- ?Clinic Level of Care Assessment Details ?Patient Name: [name] ?Date of Service: 05/15/2021 3:00 PM ?Medical Record Number: [id number] ?Patient Account Number: [id number] ?Date of Birth/Sex: 20-Jan-1941 (81 y.o. F) ?Treating RN: Levora Dredge ?Primary Care Gracelin Weisberg: Claris Gower Other Clinician: ?Referring Hoyt Leanos: Claris Gower ?Treating Gilbert Manolis/Extender: Jeri Cos ?Weeks in Treatment: 4 ?Clinic Level of Care Assessment Items ?TOOL 1 Quantity Score ?'[]'$  - Use when EandM and Procedure is performed on INITIAL visit 0 ?ASSESSMENTS - Nursing Assessment / Reassessment ?'[]'$  - General Physical Exam (combine w/ comprehensive assessment (listed just below) when performed on new ?0 ?pt. evals) ?'[]'$  - 0 ?Comprehensive Assessment (HX, ROS, Risk  Assessments, Wounds Hx, etc.) ?ASSESSMENTS - Wound and Skin Assessment / Reassessment ?'[]'$  - Dermatologic / Skin Assessment (not related to wound area) 0 ?ASSESSMENTS - Ostomy and/or Continence Assessment and Care ?'[]'$  - Incontinence Assessment and Management 0 ?'[]'$  - 0 ?Ostomy Care Assessment and Management (repouching, etc.) ?PROCESS - Coordination of Care ?'[]'$  - Simple Patient / Family Education for ongoing care 0 ?'[]'$  - 0 ?Complex (extensive) Patient / Family Education for ongoing care ?'[]'$  - 0 ?Staff obtains Consents, Records, Test Results / Process Orders ?'[]'$  - 0 ?Staff telephones HHA, Nursing Homes / Clarify orders / etc ?'[]'$  - 0 ?Routine Transfer to another Facility (non-emergent condition) ?'[]'$  - 0 ?Routine Hospital Admission (non-emergent condition) ?'[]'$  - 0 ?New Admissions / Biomedical engineer / Ordering NPWT, Apligraf, etc. ?'[]'$  - 0 ?Emergency Hospital Admission (emergent condition) ?PROCESS - Special Needs ?'[]'$  - Pediatric / Minor Patient Management 0 ?'[]'$  - 0 ?Isolation Patient Management ?'[]'$  - 0 ?Hearing / Language / Visual special needs ?'[]'$  - 0 ?Assessment of Community assistance (transportation, D/C planning, etc.) ?'[]'$  - 0 ?Additional assistance / Altered mentation ?'[]'$  - 0 ?Support Surface(s) Assessment (bed, cushion, seat, etc.) ?INTERVENTIONS - Miscellaneous ?'[]'$  - External ear exam 0 ?'[]'$  - 0 ?Patient Transfer (multiple staff / Civil Service fast streamer / Similar devices) ?'[]'$  - 0 ?Simple Staple / Suture removal (25 or less) ?'[]'$  - 0 ?Complex Staple / Suture removal (26 or more) ?'[]'$  - 0 ?Hypo/Hyperglycemic Management (do not check if billed separately) ?'[]'$  - 0 ?Ankle / Brachial Index (ABI) - do not check if billed separately ?Has the patient been seen at the hospital within the last three years: Yes ?Total Score: 0 ?Level Of Care: ____ ?[name], [name] ([id number]) ?Electronic Signature(s) ?Signed: 05/15/2021 4:31:50 PM By: Levora Dredge ?Entered By: Levora Dredge on 05/15/2021 16:14:20 ?[name], [name]  ([id number]) ?-------------------------------------------------------------------------------- ?Encounter Discharge Information Details ?Patient Name: [name] ?Date of Service: 05/15/2021 3:00 PM ?Medical Record Number:  [id number] ?Patient Account Number: [id number] ?Date of Birth/Sex: 11-Dec-1940 (81 y.o. F) ?Treating RN: Levora Dredge ?Primary Care Nelli Swalley: Claris Gower Other Clinician: ?Referring Myiah Petkus: Claris Gower ?Treating Genelda Roark/Extender: Jeri Cos ?Weeks in Treatment: 4 ?Encounter Discharge Information Items Post Procedure Vitals ?Discharge Condition: Stable ?Temperature (?F): 97.7 ?Ambulatory Status: [name] ?Pulse (bpm): 77 ?Discharge Destination: Home ?Respiratory Rate (breaths/min): 18 ?Transportation: Other ?Blood Pressure (mmHg): 146/58 ?Accompanied By: self ?Schedule Follow-up Appointment: Yes ?Clinical Summary of Care: ?Electronic Signature(s) ?Signed: 05/15/2021 4:15:36 PM By: Levora Dredge ?Entered By: Levora Dredge on 05/15/2021 16:15:36 ?[name], [name] ([id number]) ?-------------------------------------------------------------------------------- ?Lower Extremity Assessment Details ?Patient Name: [name] ?Date of Service: 05/15/2021 3:00 PM ?Medical Record Number: [id number] ?Patient Account Number: [id number] ?Date of Birth/Sex: [birth year] (81 y.o. F) ?Treating RN: Levora Dredge ?Primary Care Sabria Florido: Claris Gower Other Clinician: ?Referring Breaunna Gottlieb: Claris Gower ?Treating Shalin Vonbargen/Extender: Jeri Cos ?Weeks in Treatment: 4 ?Edema Assessment ?Assessed: [Left: No] [Right: No] ?Edema: [Left: Ye] [Right: s] ?Calf ?Left: Right: ?Point of Measurement: 30 cm From Medial Instep 40.5 cm ?Ankle ?Left: Right: ?Point of Measurement: 10 cm From Medial Instep 27.5 cm ?Vascular Assessment ?Pulses: ?Dorsalis Pedis ?Palpable: [Left:Yes] ?Electronic Signature(s) ?Signed: 05/15/2021 4:31:50 PM By: Levora Dredge ?Entered By: Levora Dredge on 05/15/2021 15:24:46 ?[name], [name]  ([id number]) ?-------------------------------------------------------------------------------- ?Multi Wound Chart Details ?Patient Name: [name] ?Date of Service: 05/15/2021 3:00 PM ?Medical Record Number: [id number] ?Patient Account Number: [id number] ?Date of Birth/Sex: 1940/03/01 (81 y.o. 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/A ?Extremity Extremity ?Comorbid History: Chronic sinus problems/congestion, Chronic sinus problems/congestion, N/A ?Anemia, Sleep Apnea, Congestive Anemia, Sleep Apnea, Congestive ?Heart Failure, Hypertension, Type II Heart Failure, Hypertension, Type II ?Diabetes, Neuropathy Diabetes, Neuropathy ?Date Acquired: 02/23/2020 04/17/2021 N/A ?Weeks of Treatment: 4 4 N/A ?Wound Status: Open Open N/A ?Wound Recurrence: No No N/A ?Measurements L x W x D (cm) 0.6x0.4x0.1 0.2x0.3x0.1 N/A ?Area (cm?) : 0.188 0.047 N/A ?Volume (cm?) : 0.019 0.005 N/A ?% Reduction in Area: 96.00% 95.80% N/A ?% Reduction in Volume: 96.00% 95.60% N/A ?Starting Position 1 (o'clock): 12 ?Ending Position 1 (o'clock): 6 ?Maximum Distance 1 (cm): 0.6 ?Undermining: Yes No N/A ?Classification: Grade 1 Grade 2 N/A ?Exudate Amount: Medium Medium N/A ?Exudate Type: Serosanguineous Serosanguineous N/A ?Exudate Color: red, brown red, brown N/A ?Wound Margin: Thickened N/A N/A ?Granulation Amount: Medium (34-66%) Small (1-33%) N/A ?Granulation Quality: Pink, Pale Pale N/A ?Necrotic Amount:  Medium (34-66%) Large (67-100%) N/A ?Exposed Structures: ?Fat Layer (Subcutaneous Tissue): Fat Layer (Subcutaneous Tissue): N/A ?Yes Yes ?Epithelialization: None None N/A ?Treatment Notes ?Electronic Signature(s) ?Si

## 2021-05-22 ENCOUNTER — Encounter: Payer: Medicare Other | Admitting: Physician Assistant

## 2021-05-22 DIAGNOSIS — E11621 Type 2 diabetes mellitus with foot ulcer: Secondary | ICD-10-CM | POA: Diagnosis not present

## 2021-05-22 NOTE — Progress Notes (Addendum)
Timson, [name] ([id number]) ?Visit Report for 05/22/2021 ?Chief Complaint Document Details ?Patient Name: [name] ?Date of Service: 05/22/2021 3:00 PM ?Medical Record Number: [id number] ?Patient Account Number: [id number] ?Date of Birth/Sex: 1941-01-08 (81 y.o. F) ?Treating RN: Cornell Barman ?Primary Care Provider: Claris Gower Other Clinician: ?Referring Provider: Claris Gower ?Treating Provider/Extender: Jeri Cos ?Weeks in Treatment: 5 ?Information Obtained from: Patient ?Chief Complaint ?Multiple toe ulcersations ?Electronic Signature(s) ?Signed: 05/22/2021 3:47:07 PM By: Worthy Keeler PA-C ?Previous Signature: 05/22/2021 3:46:50 PM Version By: Worthy Keeler PA-C ?Entered By: Worthy Keeler on 05/22/2021 15:47:07 ?Manard, [name] ([id number]) ?-------------------------------------------------------------------------------- ?Debridement Details ?Patient Name: [name] ?Date of Service: 05/22/2021 3:00 PM ?Medical Record Number: [id number] ?Patient Account Number: [id number] ?Date of Birth/Sex: 1940-08-31 (81 y.o. F) ?Treating RN: Cornell Barman ?Primary Care Provider: Claris Gower Other Clinician: ?Referring Provider: Claris Gower ?Treating Provider/Extender: Jeri Cos ?Weeks in Treatment: 5 ?Debridement Performed for ?Wound #1 Left Toe Great ?Assessment: ?Performed By: Physician Tommie Sams., PA-C ?Debridement Type: Debridement ?Severity of Tissue Pre Debridement: Fat layer exposed ?Level of Consciousness (Pre- ?Awake and Alert ?procedure): ?Pre-procedure Verification/Time Out ?No ?Taken: ?Total Area Debrided (L x W): 0.5 (cm) x 0.5 (cm) = 0.25 (cm?) ?Tissue and other material ?Viable, Non-Viable, Callus, Slough, Subcutaneous, [place] ?debrided: ?Level: Skin/Subcutaneous Tissue ?Debridement Description: Excisional ?Instrument: Curette ?Bleeding: Minimum ?Hemostasis Achieved: Pressure ?Response to Treatment: Procedure was tolerated well ?Level of Consciousness (Post- ?Awake and  Alert ?procedure): ?Post Debridement Measurements of Total Wound ?Length: (cm) 0.6 ?Width: (cm) 0.6 ?Depth: (cm) 0.3 ?Volume: (cm?) 0.085 ?Character of Wound/Ulcer Post Debridement: Stable ?Severity of Tissue Post Debridement: Fat layer exposed ?Post Procedure Diagnosis ?Same as Pre-procedure ?Electronic Signature(s) ?Signed: 05/22/2021 4:29:09 PM By: Gretta Cool, BSN, RN, CWS, [name] RN, BSN ?Signed: 05/22/2021 4:34:33 PM By: Worthy Keeler PA-C ?Entered By: Gretta Cool, BSN, RN, CWS, [name] on 05/22/2021 15:55:00 ?Bogosian, [name] ([id number]) ?-------------------------------------------------------------------------------- ?HPI Details ?Patient Name: [name] ?Date of Service: 05/22/2021 3:00 PM ?Medical Record Number: [id number] ?Patient Account Number: [id number] ?Date of Birth/Sex: 04-11-1940 (81 y.o. F) ?Treating RN: Cornell Barman ?Primary Care Provider: Claris Gower Other Clinician: ?Referring Provider: Claris Gower ?Treating Provider/Extender: Jeri Cos ?Weeks in Treatment: 5 ?History of Present Illness ?HPI Description: 04/17/2021 upon evaluation patient appears to be doing somewhat poorly currently in regard to her first and second toe of the ?left foot. She previously has been seen by Dr. Doran Durand she saw him on 31 January he did not feel there was any evidence of osteomyelitis. He did ?give her a thorough evaluation including x-rays and showed no abnormal findings according to notes. With that being said he felt like that wound ?care would be beneficial therefore she contacted [place]. She has currently been using antibiotic ointment and has noted that this wound on the great ?toe has been there for about a year. She is not certain whether there is anything on the second toe or not she has not really noted any drainage ?but at the same time she cannot be sure there is nothing hiding up underneath a significant callus here. Her feet really do not have any major ?complications with regard to pain and that is good news. She  does have neuropathy. ?Patient does have a history of diabetes mellitus type 2, hypertension, and her most recent hemoglobin A1c was 7.6 on June 2022. ?05/08/2021 upon evaluation today patient appears to be doing poorly in regard to her toes unfortunately she has a lot of callus buildup. Its been ?almost a  month since I last saw her. Obviously in this amount of time she has built up quite a bit of callus she was sick that is the reason she did ?not make it in. Nonetheless I do think that weekly visits is probably can be the best thing is asking to help to allow these areas to heal much more ?effectively and quickly. Fortunately I do not see any evidence of active infection locally or systemically which is great news. ?05/15/2021 upon evaluation today patient appears to be doing well currently in regard to her wound. Overall I think that we are definitely headed ?in the right direction with regard to the toes. Fortunately I do not see any signs of active infection locally or systemically at this time which is ?great news. No fevers, chills, nausea, vomiting, or diarrhea. ?05/22/2021 upon evaluation today patient appears to be doing well with regard to her wound. She has been tolerating the dressing changes ?without complication. Fortunately the second toe is healed the first toe on the left foot is still open but does not appear to be doing nearly as ?poorly at this time. I do think we can perform a little bit of debridement in order to clear away some of the necrotic debris patient is in agreement ?with that plan. ?Electronic Signature(s) ?Signed: 05/22/2021 4:15:35 PM By: Worthy Keeler PA-C ?Entered By: Worthy Keeler on 05/22/2021 16:15:34 ?Carelli, [name] ([id number]) ?-------------------------------------------------------------------------------- ?Physical Exam Details ?Patient Name: [name] ?Date of Service: 05/22/2021 3:00 PM ?Medical Record Number: [id number] ?Patient Account Number: [id number] ?Date of  Birth/Sex: [birth year] (81 y.o. F) ?Treating RN: Cornell Barman ?Primary Care Provider: Claris Gower Other Clinician: ?Referring Provider: Claris Gower ?Treating Provider/Extender: Jeri Cos ?Weeks in Treatment: 5 ?Constitutional ?Well-nourished and well-hydrated in no acute distress. ?Respiratory ?normal breathing without difficulty. ?Psychiatric ?this patient is able to make decisions and demonstrates good insight into disease process. Alert and Oriented x 3. pleasant and cooperative. ?Notes ?I did go ahead and perform sharp debridement to clear away some of the necrotic debris patient tolerated that debridement today without ?complication she does have some bleeding this was controlled with pressure postdebridement it appears to be doing much better. ?Electronic Signature(s) ?Signed: 05/22/2021 4:15:51 PM By: Worthy Keeler PA-C ?Entered By: Worthy Keeler on 05/22/2021 16:15:51 ?Davtyan, [name] ([id number]) ?-------------------------------------------------------------------------------- ?Physician Orders Details ?Patient Name: [name] ?Date of Service: 05/22/2021 3:00 PM ?Medical Record Number: [id number] ?Patient Account Number: [id number] ?Date of Birth/Sex: Sep 04, 1940 (81 y.o. 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 ?o Nurse Visit as needed ?o Other: - Please call wound clinic if after taking fluid pill redness persists in left lower extremity ?Bathing/ Shower/  Hygiene ?o Wash wounds with antibacterial soap and water. ?o May shower; gently cleanse wound with antibacterial soap, rinse and pat dry prior to dressing wounds ?o No tub bath. ?Edema Control - Lymphedema / Seg

## 2021-05-22 NOTE — Progress Notes (Signed)
[name] ([id number]) ?Visit Report for 05/22/2021 ?Arrival Information Details ?Patient Name: [name] ?Date of Service: 05/22/2021 3:00 PM ?Medical Record Number: [id number] ?Patient Account Number: [id number] ?Date of Birth/Sex: [birth year] (81 y.o. F) ?Treating RN: Cornell Barman ?Primary Care Easton Fetty: Claris Gower Other Clinician: ?Referring Elan Brainerd: Claris Gower ?Treating Darria Corvera/Extender: Jeri Cos ?Weeks in Treatment: 5 ?Visit Information History Since Last Visit ?Added or deleted any medications: No ?Patient Arrived: [name] ?Has Dressing in Place as Prescribed: Yes ?Arrival Time: 15:42 ?Pain Present Now: No ?Accompanied By: self ?Transfer Assistance: None ?Patient Identification Verified: Yes ?Secondary Verification Process Completed: Yes ?Electronic Signature(s) ?Signed: 05/22/2021 4:29:09 PM By: Gretta Cool, BSN, RN, CWS, [name] RN, BSN ?Entered By: Gretta Cool, BSN, RN, CWS, [name] on 05/22/2021 15:42:30 ?Macioce, [name] ([id number]) ?-------------------------------------------------------------------------------- ?Encounter Discharge Information Details ?Patient Name: [name] ?Date of Service: 05/22/2021 3:00 PM ?Medical Record Number: [id number] ?Patient Account Number: [id number] ?Date of Birth/Sex: [birth year] (81 y.o. F) ?Treating RN: Cornell Barman ?Primary Care Cashis Rill: Claris Gower Other Clinician: ?Referring Usiel Astarita: Claris Gower ?Treating Brenae Lasecki/Extender: Jeri Cos ?Weeks in Treatment: 5 ?Encounter Discharge Information Items Post Procedure Vitals ?Discharge Condition: Stable ?Temperature (?F): 97.7 ?Ambulatory Status: [name] ?Pulse (bpm): 80 ?Discharge Destination: Home ?Respiratory Rate (breaths/min): 16 ?Transportation: Private Auto ?Blood Pressure (mmHg): 158/56 ?Accompanied By: self ?Schedule Follow-up Appointment: Yes ?Clinical Summary of Care: ?Electronic Signature(s) ?Signed: 05/22/2021 4:29:09 PM By: Gretta Cool, BSN, RN, CWS, [name] RN, BSN ?Entered By: Gretta Cool, BSN, RN, CWS, [name] on 05/22/2021  16:12:56 ?Kurdziel, [name] ([id number]) ?-------------------------------------------------------------------------------- ?Lower Extremity Assessment Details ?Patient Name: [name] ?Date of Service: 05/22/2021 3:00 PM ?Medical Record Number: [id number] ?Patient Account Number: [id number] ?Date of Birth/Sex: [birth year] (81 y.o. F) ?Treating RN: Cornell Barman ?Primary Care Danaya Geddis: Claris Gower Other Clinician: ?Referring [name]: Claris Gower ?Treating Tyleek Smick/Extender: Jeri Cos ?Weeks in Treatment: 5 ?Edema Assessment ?Assessed: [Left: Yes] [Right: No] ?Edema: [Left: Ye] [Right: s] ?Calf ?Left: Right: ?Point of Measurement: 30 cm From Medial Instep 39 cm ?Ankle ?Left: Right: ?Point of Measurement: 10 cm From Medial Instep 26 cm ?Vascular Assessment ?Pulses: ?Dorsalis Pedis ?Palpable: [Left:Yes] ?Electronic Signature(s) ?Signed: 05/22/2021 4:29:09 PM By: Gretta Cool, BSN, RN, CWS, [name] RN, BSN ?Entered By: Gretta Cool, BSN, RN, CWS, [name] on 05/22/2021 15:53:49 ?Ceniceros, [name] ([id number]) ?-------------------------------------------------------------------------------- ?Multi Wound Chart Details ?Patient Name: [name] ?Date of Service: 05/22/2021 3:00 PM ?Medical Record Number: [id number] ?Patient Account Number: [id number] ?Date of Birth/Sex: Mar 29, 1940 (81 y.o. F) ?Treating RN: Cornell Barman ?Primary Care Elan Brainerd: Claris Gower Other Clinician: ?Referring [name]: Claris Gower ?Treating [name]/Extender: Jeri Cos ?Weeks in Treatment: 5 ?Vital Signs ?Height(in): 61 ?Pulse(bpm): 80 ?Weight(lbs): 250 ?Blood Pressure(mmHg): 158/56 ?Body Mass Index(BMI): 47.2 ?Temperature(??F): 97.7 ?Respiratory Rate(breaths/min): 16 ?Photos: [2:No Photos] [N/A:N/A] ?Wound Location: Left Toe Great Left Toe Second N/A ?Wounding Event: Gradually Appeared Gradually Appeared N/A ?Primary Etiology: Diabetic Wound/Ulcer of the Lower Diabetic Wound/Ulcer of the Lower N/A ?Extremity Extremity ?Comorbid History: Chronic sinus  problems/congestion, N/A N/A ?Anemia, Sleep Apnea, Congestive ?Heart Failure, Hypertension, Type II ?Diabetes, Neuropathy ?Date Acquired: 02/23/2020 04/17/2021 N/A ?Weeks of Treatment: 5 5 N/A ?Wound Status: Open Healed - Epithelialized N/A ?Wound Recurrence: No No N/A ?Measurements L x W x D (cm) 0.5x0.5x0.2 0x0x0 N/A ?Area (cm?) : 0.196 0 N/A ?Volume (cm?) : 0.039 0 N/A ?% Reduction in Area: 95.80% 100.00% N/A ?% Reduction in Volume: 91.70% 100.00% N/A ?Classification: Grade 1 Grade 2 N/A ?Exudate Amount: Medium Medium N/A ?Exudate Type: Serosanguineous Serosanguineous N/A ?Exudate Color: red, brown red, brown N/A ?Wound Margin: Thickened N/A  N/A ?Granulation Amount: Large (67-100%) N/A N/A ?Granulation Quality: Pink, Pale N/A N/A ?Necrotic Amount: Small (1-33%) N/A N/A ?Exposed Structures: ?Fat Layer (Subcutaneous Tissue): N/A N/A ?Yes ?Epithelialization: None N/A N/A ?Treatment Notes ?Electronic Signature(s) ?Signed: 05/22/2021 4:29:09 PM By: Gretta Cool, BSN, RN, CWS, [name] RN, BSN ?Entered By: Gretta Cool, BSN, RN, CWS, [name] on 05/22/2021 15:54:03 ?Smiles, [name] ([id number]) ?-------------------------------------------------------------------------------- ?Multi-Disciplinary Care Plan Details ?Patient Name: [name] ?Date of Service: 05/22/2021 3:00 PM ?Medical Record Number: [id number] ?Patient Account Number: [id number] ?Date of Birth/Sex: December 31, 1940 (81 y.o. F) ?Treating RN: Cornell Barman ?Primary Care [name]: Claris Gower Other Clinician: ?Referring [name]: Claris Gower ?Treating [name]/Extender: Jeri Cos ?Weeks in Treatment: 5 ?Active Inactive ?Orientation to the Wound Care Program ?Nursing Diagnoses: ?Knowledge deficit related to the wound healing center program ?Goals: ?Patient/caregiver will verbalize understanding of the [place] ?Date Initiated: 04/17/2021 ?Target Resolution Date: 04/17/2021 ?Goal Status: Active ?Interventions: ?Provide education on orientation to the wound  center ?Notes: ?Wound/Skin Impairment ?Nursing Diagnoses: ?Impaired tissue integrity ?Knowledge deficit related to ulceration/compromised skin integrity ?Goals: ?Ulcer/skin breakdown will have a volume reduction of 30% by week 4 ?Date Initiated: 04/17/2021 ?Target Resolution Date: 05/15/2021 ?Goal Status: Active ?Ulcer/skin breakdown will have a volume reduction of 50% by week 8 ?Date Initiated: 04/17/2021 ?Target Resolution Date: 06/12/2021 ?Goal Status: Active ?Ulcer/skin breakdown will have a volume reduction of 80% by week 12 ?Date Initiated: 04/17/2021 ?Target Resolution Date: 07/10/2021 ?Goal Status: Active ?Ulcer/skin breakdown will heal within 14 weeks ?Date Initiated: 04/17/2021 ?Target Resolution Date: 07/24/2021 ?Goal Status: Active ?Interventions: ?Assess patient/caregiver ability to obtain necessary supplies ?Assess patient/caregiver ability to perform ulcer/skin care regimen upon admission and as needed ?Assess ulceration(s) every visit ?Provide education on ulcer and skin care ?Notes: ?Electronic Signature(s) ?Signed: 05/22/2021 4:29:09 PM By: Gretta Cool, BSN, RN, CWS, [name] RN, BSN ?Entered By: Gretta Cool, BSN, RN, CWS, [name] on 05/22/2021 15:53:53 ?Cubillos, [name] ([id number]) ?-------------------------------------------------------------------------------- ?Pain Assessment Details ?Patient Name: [name] ?Date of Service: 05/22/2021 3:00 PM ?Medical Record Number: [id number] ?Patient Account Number: [id number] ?Date of Birth/Sex: 08-25-1940 (81 y.o. F) ?Treating RN: Cornell Barman ?Primary Care Neilson Oehlert: Claris Gower Other Clinician: ?Referring Dayana Dalporto: Claris Gower ?Treating Daquon Greenleaf/Extender: Jeri Cos ?Weeks in Treatment: 5 ?Active Problems ?Location of Pain Severity and Description of Pain ?Patient Has Paino No ?Site Locations ?Pain Management and Medication ?Current Pain Management: ?Electronic Signature(s) ?Signed: 05/22/2021 4:29:09 PM By: Gretta Cool, BSN, RN, CWS, [name] RN, BSN ?Entered By: Gretta Cool, BSN, RN, CWS, [name]  on 05/22/2021 15:43:58 ?Swier, [name] ([id number]) ?-------------------------------------------------------------------------------- ?Patient/Caregiver Education Details ?Patient Name: Brines, Tyjai A. ?Date of Service:

## 2021-05-29 ENCOUNTER — Encounter: Payer: Medicare Other | Attending: Physician Assistant | Admitting: Physician Assistant

## 2021-05-29 ENCOUNTER — Other Ambulatory Visit
Admission: RE | Admit: 2021-05-29 | Discharge: 2021-05-29 | Disposition: A | Discharge status: Home / Self Care | Payer: Medicare Other | Source: Ambulatory Visit | Attending: Physician Assistant | Admitting: Physician Assistant

## 2021-05-29 DIAGNOSIS — L089 Local infection of the skin and subcutaneous tissue, unspecified: Secondary | ICD-10-CM | POA: Diagnosis present

## 2021-05-29 DIAGNOSIS — I10 Essential (primary) hypertension: Secondary | ICD-10-CM | POA: Diagnosis not present

## 2021-05-29 DIAGNOSIS — L97522 Non-pressure chronic ulcer of other part of left foot with fat layer exposed: Secondary | ICD-10-CM | POA: Insufficient documentation

## 2021-05-29 DIAGNOSIS — Z89421 Acquired absence of other right toe(s): Secondary | ICD-10-CM | POA: Diagnosis not present

## 2021-05-29 DIAGNOSIS — E11621 Type 2 diabetes mellitus with foot ulcer: Secondary | ICD-10-CM | POA: Insufficient documentation

## 2021-05-29 DIAGNOSIS — Z89411 Acquired absence of right great toe: Secondary | ICD-10-CM | POA: Insufficient documentation

## 2021-05-29 NOTE — Progress Notes (Signed)
Copen, [name] ([id number]) ?Visit Report for 05/29/2021 ?Arrival Information Details ?Patient Name: [name] ?Date of Service: 05/29/2021 9:45 AM ?Medical Record Number: [id number] ?Patient Account Number: [id number] ?Date of Birth/Sex: 08-18-1940 (81 y.o. F) ?Treating RN: Levora Dredge ?Primary Care Myliyah Rebuck: Claris Gower Other Clinician: ?Referring Geralene Afshar: Claris Gower ?Treating Fabrizio Filip/Extender: Jeri Cos ?Weeks in Treatment: 6 ?Visit Information History Since Last Visit ?Added or deleted any medications: No ?Patient Arrived: [name] ?Any new allergies or adverse reactions: No ?Arrival Time: 10:16 ?Had a fall or experienced change in No ?Accompanied By: self ?activities of daily living that may affect ?Transfer Assistance: None ?risk of falls: ?Patient Identification Verified: Yes ?Hospitalized since last visit: No ?Secondary Verification Process Completed: Yes ?Has Dressing in Place as Prescribed: Yes ?Pain Present Now: No ?Electronic Signature(s) ?Signed: 05/29/2021 3:13:50 PM By: Levora Dredge ?Entered By: Levora Dredge on 05/29/2021 10:20:03 ?Lodico, [name] ([id number]) ?-------------------------------------------------------------------------------- ?Clinic Level of Care Assessment Details ?Patient Name: [name] ?Date of Service: 05/29/2021 9:45 AM ?Medical Record Number: [id number] ?Patient Account Number: [id number] ?Date of Birth/Sex: [birth year] (81 y.o. F) ?Treating RN: Levora Dredge ?Primary Care Kimo Bancroft: Claris Gower Other Clinician: ?Referring Adger Cantera: Claris Gower ?Treating Guila Owensby/Extender: Jeri Cos ?Weeks in Treatment: 6 ?Clinic Level of Care Assessment Items ?TOOL 1 Quantity Score ?'[]'$  - Use when EandM and Procedure is performed on INITIAL visit 0 ?ASSESSMENTS - Nursing Assessment / Reassessment ?'[]'$  - General Physical Exam (combine w/ comprehensive assessment (listed just below) when performed on new ?0 ?pt. evals) ?'[]'$  - 0 ?Comprehensive Assessment (HX, ROS, Risk  Assessments, Wounds Hx, etc.) ?ASSESSMENTS - Wound and Skin Assessment / Reassessment ?'[]'$  - Dermatologic / Skin Assessment (not related to wound area) 0 ?ASSESSMENTS - Ostomy and/or Continence Assessment and Care ?'[]'$  - Incontinence Assessment and Management 0 ?'[]'$  - 0 ?Ostomy Care Assessment and Management (repouching, etc.) ?PROCESS - Coordination of Care ?'[]'$  - Simple Patient / Family Education for ongoing care 0 ?'[]'$  - 0 ?Complex (extensive) Patient / Family Education for ongoing care ?'[]'$  - 0 ?Staff obtains Consents, Records, Test Results / Process Orders ?'[]'$  - 0 ?Staff telephones HHA, Nursing Homes / Clarify orders / etc ?'[]'$  - 0 ?Routine Transfer to another Facility (non-emergent condition) ?'[]'$  - 0 ?Routine Hospital Admission (non-emergent condition) ?'[]'$  - 0 ?New Admissions / Biomedical engineer / Ordering NPWT, Apligraf, etc. ?'[]'$  - 0 ?Emergency Hospital Admission (emergent condition) ?PROCESS - Special Needs ?'[]'$  - Pediatric / Minor Patient Management 0 ?'[]'$  - 0 ?Isolation Patient Management ?'[]'$  - 0 ?Hearing / Language / Visual special needs ?'[]'$  - 0 ?Assessment of Community assistance (transportation, D/C planning, etc.) ?'[]'$  - 0 ?Additional assistance / Altered mentation ?'[]'$  - 0 ?Support Surface(s) Assessment (bed, cushion, seat, etc.) ?INTERVENTIONS - Miscellaneous ?'[]'$  - External ear exam 0 ?'[]'$  - 0 ?Patient Transfer (multiple staff / Civil Service fast streamer / Similar devices) ?'[]'$  - 0 ?Simple Staple / Suture removal (25 or less) ?'[]'$  - 0 ?Complex Staple / Suture removal (26 or more) ?'[]'$  - 0 ?Hypo/Hyperglycemic Management (do not check if billed separately) ?'[]'$  - 0 ?Ankle / Brachial Index (ABI) - do not check if billed separately ?Has the patient been seen at the hospital within the last three years: Yes ?Total Score: 0 ?Level Of Care: ____ ?Largent, [name] ([id number]) ?Electronic Signature(s) ?Signed: 05/29/2021 3:13:50 PM By: Levora Dredge ?Entered By: Levora Dredge on 05/29/2021 11:08:32 ?Vandeberg, [name]  ([id number]) ?-------------------------------------------------------------------------------- ?Encounter Discharge Information Details ?Patient Name: [name] ?Date of Service: 05/29/2021 9:45 AM ?Medical Record Number:  [id number] ?Patient Account Number: [id number] ?Date of Birth/Sex: 29-May-1940 (81 y.o. F) ?Treating RN: Levora Dredge ?Primary Care Alley Neils: Claris Gower Other Clinician: ?Referring Saydi Kobel: Claris Gower ?Treating Buford Gayler/Extender: Jeri Cos ?Weeks in Treatment: 6 ?Encounter Discharge Information Items Post Procedure Vitals ?Discharge Condition: Stable ?Temperature (?F): 97.9 ?Ambulatory Status: [name] ?Pulse (bpm): 84 ?Discharge Destination: Home ?Respiratory Rate (breaths/min): 18 ?Transportation: Other ?Blood Pressure (mmHg): 163/71 ?Accompanied By: self ?Schedule Follow-up Appointment: Yes ?Clinical Summary of Care: Patient Declined ?Electronic Signature(s) ?Signed: 05/29/2021 3:13:50 PM By: Levora Dredge ?Entered By: Levora Dredge on 05/29/2021 11:10:19 ?Perkovich, [name] ([id number]) ?-------------------------------------------------------------------------------- ?Lower Extremity Assessment Details ?Patient Name: [name] ?Date of Service: 05/29/2021 9:45 AM ?Medical Record Number: [id number] ?Patient Account Number: [id number] ?Date of Birth/Sex: 12/25/1940 (81 y.o. F) ?Treating RN: Levora Dredge ?Primary Care Deloras Reichard: Claris Gower Other Clinician: ?Referring Sarajane Fambrough: Claris Gower ?Treating Keyasha Miah/Extender: Jeri Cos ?Weeks in Treatment: 6 ?Edema Assessment ?Assessed: [Left: No] [Right: No] ?Edema: [Left: Ye] [Right: s] ?Calf ?Left: Right: ?Point of Measurement: 30 cm From Medial Instep 40.5 cm ?Ankle ?Left: Right: ?Point of Measurement: 10 cm From Medial Instep 27.2 cm ?Vascular Assessment ?Pulses: ?Dorsalis Pedis ?Palpable: [Left:Yes] ?Electronic Signature(s) ?Signed: 05/29/2021 3:13:50 PM By: Levora Dredge ?Entered By: Levora Dredge on 05/29/2021  10:26:30 ?Labate, [name] ([id number]) ?-------------------------------------------------------------------------------- ?Multi Wound Chart Details ?Patient Name: [name] ?Date of Service: 05/29/2021 9:45 AM ?Medical Record Number: [id number] ?Patient Account Number: [id number] ?Date of Birth/Sex: [birth year] (81 y.o. 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 ?Comorbid History: Chronic sinus problems/congestion, N/A N/A ?Anemia, Sleep Apnea, Congestive ?Heart Failure, Hypertension, Type II ?Diabetes, Neuropathy ?Date Acquired: 02/23/2020 N/A N/A ?Weeks of Treatment: 6 N/A N/A ?Wound Status: Open N/A N/A ?Wound Recurrence: No N/A N/A ?Measurements L x W x D (cm) 0.5x0.6x0.2 N/A N/A ?Area (cm?) : 0.236 N/A N/A ?Volume (cm?) : 0.047 N/A N/A ?% Reduction in Area: 95.00% N/A N/A ?% Reduction in Volume: 90.00% N/A N/A ?Classification: Grade 1 N/A N/A ?Exudate Amount: Medium N/A N/A ?Exudate Type: Serosanguineous N/A N/A ?Exudate Color: red, brown N/A N/A ?Wound Margin: Thickened N/A N/A ?Granulation Amount: Large (67-100%) N/A N/A ?Granulation Quality: Pink, Pale N/A N/A ?Necrotic Amount: Small (1-33%) N/A N/A ?Exposed Structures: ?Fat Layer (Subcutaneous Tissue): N/A N/A ?Yes ?Epithelialization: None N/A N/A ?Treatment Notes ?Electronic Signature(s) ?Signed: 05/29/2021 3:13:50 PM By: Levora Dredge ?Entered By: Levora Dredge on 05/29/2021 10:53:30 ?Vitug, [name]  ([id number]) ?-------------------------------------------------------------------------------- ?Multi-Disciplinary Care Plan Details ?Patient Name: [name] ?Date of Service: 05/29/2021 9:45 AM ?Medical Record Number: [id number] ?Patient Account Number: [id number] ?Date of Birth/Sex: [birth year]

## 2021-05-29 NOTE — Progress Notes (Addendum)
[name], [name] ([id number]) ?Visit Report for 05/29/2021 ?Chief Complaint Document Details ?Patient Name: [name] ?Date of Service: 05/29/2021 9:45 AM ?Medical Record Number: [id number] ?Patient Account Number: [id number] ?Date of Birth/Sex: 07-30-1940 (81 y.o. F) ?Treating RN: Levora Dredge ?Primary Care Provider: Claris Gower Other Clinician: ?Referring Provider: Claris Gower ?Treating Provider/Extender: Jeri Cos ?Weeks in Treatment: 6 ?Information Obtained from: Patient ?Chief Complaint ?Multiple toe ulcersations ?Electronic Signature(s) ?Signed: 05/29/2021 10:42:44 AM By: Worthy Keeler PA-C ?Entered By: Worthy Keeler on 05/29/2021 10:42:43 ?[name], [name] ([id number]) ?-------------------------------------------------------------------------------- ?Debridement Details ?Patient Name: [name] ?Date of Service: 05/29/2021 9:45 AM ?Medical Record Number: [id number] ?Patient Account Number: [id number] ?Date of Birth/Sex: 1940-11-24 (81 y.o. F) ?Treating RN: Levora Dredge ?Primary Care Provider: Claris Gower Other Clinician: ?Referring Provider: Claris Gower ?Treating Provider/Extender: Jeri Cos ?Weeks in Treatment: 6 ?Debridement Performed for ?Wound #1 Left Toe Great ?Assessment: ?Performed By: Physician Tommie Sams., PA-C ?Debridement Type: Debridement ?Severity of Tissue Pre Debridement: Fat layer exposed ?Level of Consciousness (Pre- ?Awake and Alert ?procedure): ?Pre-procedure Verification/Time Out ?Yes - 10:55 ?Taken: ?Total Area Debrided (L x W): 0.5 (cm) x 0.6 (cm) = 0.3 (cm?) ?Tissue and other material ?Viable, Non-Viable, Callus, Slough, Subcutaneous, [place] ?debrided: ?Level: Skin/Subcutaneous Tissue ?Debridement Description: Excisional ?Instrument: Curette ?Bleeding: Minimum ?Hemostasis Achieved: Pressure ?Response to Treatment: Procedure was tolerated well ?Level of Consciousness (Post- ?Awake and Alert ?procedure): ?Post Debridement Measurements of Total Wound ?Length:  (cm) 0.5 ?Width: (cm) 0.6 ?Depth: (cm) 0.2 ?Volume: (cm?) 0.047 ?Character of Wound/Ulcer Post Debridement: Stable ?Severity of Tissue Post Debridement: Fat layer exposed ?Post Procedure Diagnosis ?Same as Pre-procedure ?Electronic Signature(s) ?Signed: 05/29/2021 11:06:21 AM By: Worthy Keeler PA-C ?Signed: 05/29/2021 3:13:50 PM By: Levora Dredge ?Entered By: Worthy Keeler on 05/29/2021 11:06:20 ?[name], [name] ([id number]) ?-------------------------------------------------------------------------------- ?HPI Details ?Patient Name: [name] ?Date of Service: 05/29/2021 9:45 AM ?Medical Record Number: [id number] ?Patient Account Number: [id number] ?Date of Birth/Sex: Jun 29, 1940 (81 y.o. F) ?Treating RN: Levora Dredge ?Primary Care Provider: Claris Gower Other Clinician: ?Referring Provider: Claris Gower ?Treating Provider/Extender: Jeri Cos ?Weeks in Treatment: 6 ?History of Present Illness ?HPI Description: 04/17/2021 upon evaluation patient appears to be doing somewhat poorly currently in regard to her first and second toe of the ?left foot. She previously has been seen by Dr. Doran Durand she saw him on 31 January he did not feel there was any evidence of osteomyelitis. He did ?give her a thorough evaluation including x-rays and showed no abnormal findings according to notes. With that being said he felt like that wound ?care would be beneficial therefore she contacted [place]. She has currently been using antibiotic ointment and has noted that this wound on the great ?toe has been there for about a year. She is not certain whether there is anything on the second toe or not she has not really noted any drainage ?but at the same time she cannot be sure there is nothing hiding up underneath a significant callus here. Her feet really do not have any major ?complications with regard to pain and that is good news. She does have neuropathy. ?Patient does have a history of diabetes mellitus type 2, hypertension, and  her most recent hemoglobin A1c was 7.6 on June 2022. ?05/08/2021 upon evaluation today patient appears to be doing poorly in regard to her toes unfortunately she has a lot of callus buildup. Its been ?almost a month since I last saw her. Obviously in this amount of time she has built up  quite a bit of callus she was sick that is the reason she did ?not make it in. Nonetheless I do think that weekly visits is probably can be the best thing is asking to help to allow these areas to heal much more ?effectively and quickly. Fortunately I do not see any evidence of active infection locally or systemically which is great news. ?05/15/2021 upon evaluation today patient appears to be doing well currently in regard to her wound. Overall I think that we are definitely headed ?in the right direction with regard to the toes. Fortunately I do not see any signs of active infection locally or systemically at this time which is ?great news. No fevers, chills, nausea, vomiting, or diarrhea. ?05/22/2021 upon evaluation today patient appears to be doing well with regard to her wound. She has been tolerating the dressing changes ?without complication. Fortunately the second toe is healed the first toe on the left foot is still open but does not appear to be doing nearly as ?poorly at this time. I do think we can perform a little bit of debridement in order to clear away some of the necrotic debris patient is in agreement ?with that plan. ?05-29-2021 upon evaluation today patient appears to be doing worse currently in regard to the pain experience. Her leg is still very red in fact I ?think is a little bit worse than last week unfortunately. Last week I was even concerned about a little bit of cellulitis she really felt like it was more ?related to her swelling but again I am not certain that that is necessarily the case. Fortunately I do not see any signs of active infection at this ?time systemically though locally I am definitely  concerned in this regard. ?Electronic Signature(s) ?Signed: 05/29/2021 11:03:13 AM By: Worthy Keeler PA-C ?Entered By: Worthy Keeler on 05/29/2021 11:03:13 ?[name], [name] ([id number]) ?-------------------------------------------------------------------------------- ?Physical Exam Details ?Patient Name: [name] ?Date of Service: 05/29/2021 9:45 AM ?Medical Record Number: [id number] ?Patient Account Number: [id number] ?Date of Birth/Sex: 06-19-1940 (81 y.o. F) ?Treating RN: Levora Dredge ?Primary Care Provider: Claris Gower Other Clinician: ?Referring Provider: Claris Gower ?Treating Provider/Extender: Jeri Cos ?Weeks in Treatment: 6 ?Constitutional ?Well-nourished and well-hydrated in no acute distress. ?Respiratory ?normal breathing without difficulty. ?Psychiatric ?this patient is able to make decisions and demonstrates good insight into disease process. Alert and Oriented x 3. pleasant and cooperative. ?Notes ?Upon inspection patient's wound bed actually showed signs of good granulation and epithelization at this point. Fortunately there does not appear ?to be any signs of active infection systemically which is great news. No fevers, chills, nausea, vomiting, or diarrhea. ?Electronic Signature(s) ?Signed: 05/29/2021 11:03:42 AM By: Worthy Keeler PA-C ?Entered By: Worthy Keeler on 05/29/2021 11:03:41 ?[name], [name] ([id number]) ?-------------------------------------------------------------------------------- ?Physician Orders Details ?Patient Name: [name] ?Date of Service: 05/29/2021 9:45 AM ?Medical Record Number: [id number] ?Patient Account Number: [id number] ?Date of Birth/Sex: [birth year] (81 y.o. 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

## 2021-06-02 LAB — AEROBIC CULTURE W GRAM STAIN (SUPERFICIAL SPECIMEN): Gram Stain: NONE SEEN

## 2021-06-05 ENCOUNTER — Encounter: Payer: Medicare Other | Admitting: Physician Assistant

## 2021-06-05 DIAGNOSIS — E11621 Type 2 diabetes mellitus with foot ulcer: Secondary | ICD-10-CM | POA: Diagnosis not present

## 2021-06-05 NOTE — Progress Notes (Addendum)
[place], [name] ([id number]) ?Visit Report for 06/05/2021 ?Chief Complaint Document Details ?Patient Name: [name] ?Date of Service: 06/05/2021 9:45 AM ?Medical Record Number: [id number] ?Patient Account Number: [id number] ?Date of Birth/Sex: March 08, 1940 (81 y.o. F) F) ?Treating RN: Cornell Barman ?Primary Care Provider: Claris Gower Other Clinician: ?Referring Provider: Claris Gower ?Treating Provider/Extender: Jeri Cos ?Weeks in Treatment: 7 ?Information Obtained from: Patient ?Chief Complaint ?Multiple toe ulcersations ?Electronic Signature(s) ?Signed: 06/05/2021 10:08:22 AM By: Worthy Keeler PA-C ?Entered By: Worthy Keeler on 06/05/2021 10:08:22 ?Uhls, [name] ([id number]) ?-------------------------------------------------------------------------------- ?HPI Details ?Patient Name: [name] ?Date of Service: 06/05/2021 9:45 AM ?Medical Record Number: [id number] ?Patient Account Number: [id number] ?Date of Birth/Sex: 1940-04-03 (81 y.o. F) ?Treating RN: Cornell Barman ?Primary Care Provider: Claris Gower Other Clinician: ?Referring Provider: Claris Gower ?Treating Provider/Extender: Jeri Cos ?Weeks in Treatment: 7 ?History of Present Illness ?HPI Description: 04/17/2021 upon evaluation patient appears to be doing somewhat poorly currently in regard to her first and second toe of the ?left foot. She previously has been seen by Dr. Doran Durand she saw him on 31 January he did not feel there was any evidence of osteomyelitis. He did ?give her a thorough evaluation including x-rays and showed no abnormal findings according to notes. With that being said he felt like that wound ?care would be beneficial therefore she contacted [place]. She has currently been using antibiotic ointment and has noted that this wound on the great ?toe has been there for about a year. She is not certain whether there is anything on the second toe or not she has not really noted any drainage ?but at the same time she cannot be sure there  is nothing hiding up underneath a significant callus here. Her feet really do not have any major ?complications with regard to pain and that is good news. She does have neuropathy. ?Patient does have a history of diabetes mellitus type 2, hypertension, and her most recent hemoglobin A1c was 7.6 on June 2022. ?05/08/2021 upon evaluation today patient appears to be doing poorly in regard to her toes unfortunately she has a lot of callus buildup. Its been ?almost a month since I last saw her. Obviously in this amount of time she has built up quite a bit of callus she was sick that is the reason she did ?not make it in. Nonetheless I do think that weekly visits is probably can be the best thing is asking to help to allow these areas to heal much more ?effectively and quickly. Fortunately I do not see any evidence of active infection locally or systemically which is great news. ?05/15/2021 upon evaluation today patient appears to be doing well currently in regard to her wound. Overall I think that we are definitely headed ?in the right direction with regard to the toes. Fortunately I do not see any signs of active infection locally or systemically at this time which is ?great news. No fevers, chills, nausea, vomiting, or diarrhea. ?05/22/2021 upon evaluation today patient appears to be doing well with regard to her wound. She has been tolerating the dressing changes ?without complication. Fortunately the second toe is healed the first toe on the left foot is still open but does not appear to be doing nearly as ?poorly at this time. I do think we can perform a little bit of debridement in order to clear away some of the necrotic debris patient is in agreement ?with that plan. ?05-29-2021 upon evaluation today patient appears to be doing worse currently  in regard to the pain experience. Her leg is still very red in fact I ?think is a little bit worse than last week unfortunately. Last week I was even concerned about a little  bit of cellulitis she really felt like it was more ?related to her swelling but again I am not certain that that is necessarily the case. Fortunately I do not see any signs of active infection at this ?time systemically though locally I am definitely concerned in this regard. ?06-05-2021 upon evaluation today patient appears to be doing well with regard to her toe ulcer this is worse is also not better though. ?Unfortunately it is hurting more in her leg in general is also bothering her. This is despite being. She is also tolerating Levaquin with making her ?dizzy. Nonetheless I am not certain its helping after she has been on this week I am not seeing much of the improvement with regard to the ?cellulitis in her leg. She is also previously been on doxycycline although that was towards the end of February. ?Electronic Signature(s) ?Signed: 06/05/2021 10:22:34 AM By: Worthy Keeler PA-C ?Entered By: Worthy Keeler on 06/05/2021 10:22:34 ?C[place], [name] ([id number]) ?-------------------------------------------------------------------------------- ?Physical Exam Details ?Patient Name: [name] ?Date of Service: 06/05/2021 9:45 AM ?Medical Record Number: [id number] ?Patient Account Number: [id number] ?Date of Birth/Sex: 1940/05/08 (81 y.o. F) ?Treating RN: Cornell Barman ?Primary Care Provider: Claris Gower Other Clinician: ?Referring Provider: Claris Gower ?Treating Provider/Extender: Jeri Cos ?Weeks in Treatment: 7 ?Constitutional ?Well-nourished and well-hydrated in no acute distress. ?Respiratory ?normal breathing without difficulty. ?Psychiatric ?this patient is able to make decisions and demonstrates good insight into disease process. Alert and Oriented x 3. pleasant and cooperative. ?Notes ?Upon inspection patient's wound bed actually showed signs of good granulation for the most part there was a little bit of slough and biofilm ?buildup little bit of callus but at the same time I am more concerned about  the cellulitis and because her toe was hurting worse I opted not to ?perform any debridement today. She was appreciative of this to be honest. ?Electronic Signature(s) ?Signed: 06/05/2021 10:22:58 AM By: Worthy Keeler PA-C ?Entered By: Worthy Keeler on 06/05/2021 10:22:58 ?Losey, [name] ([id number]) ?-------------------------------------------------------------------------------- ?Physician Orders Details ?Patient Name: [name] ?Date of Service: 06/05/2021 9:45 AM ?Medical Record Number: [id number] ?Patient Account Number: [id number] ?Date of Birth/Sex: [birth year] (81 y.o. 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 ?o Nurse Visit as needed ?Bathing/ Shower/ Hygiene ?o Wash wounds with antibacterial soap and water. ?o May shower; gently cleanse wound with antibacterial soap, rinse and pat dry prior to dressing wounds ?o No tub bath. ?Edema Control - Lymphedema / Segmental Compressive Device / Other ?o Elevate, Exercise Daily and Avoid Standing for Long Periods of Time. ?o Elevate legs to the level of the heart and pump ankles as often as possible ?o Elevate leg(s) parallel to the floor when sitting. ?o DO YOUR BEST to sleep in the bed at night. DO NOT sleep in your recliner. Long hours of sitting in a  recliner leads to ?swelling of the legs and/or potential wounds on your backside. ?Medications-Please add to medication list. ?o P.O. Antibiotics -  Discontinue Levaquin and start Cefdinir ?Wound Treatment ?Wound #1 - Toe Great Wound Laterality: Left ?Cleanser: [employer] Ancillary Kit - 15 Day Supply (Generic) Every Other Day/30 Days ?Discharge Instructions: U

## 2021-06-05 NOTE — Progress Notes (Signed)
[place], [name] ([id number]) ?Visit Report for 06/05/2021 ?Arrival Information Details ?Patient Name: [name] ?Date of Service: 06/05/2021 9:45 AM ?Medical Record Number: [id number] ?Patient Account Number: [id number] ?Date of Birth/Sex: [birth year] (81 y.o. F) ?Treating RN: Cornell Barman ?Primary Care Ardena Gangl: Claris Gower Other Clinician: ?Referring Lonzo Saulter: Claris Gower ?Treating Anay Walter/Extender: Jeri Cos ?Weeks in Treatment: 7 ?Visit Information History Since Last Visit ?Has Dressing in Place as Prescribed: Yes ?Patient Arrived: [name] ?Pain Present Now: No ?Arrival Time: 09:58 ?Accompanied By: self ?Transfer Assistance: None ?Patient Identification Verified: Yes ?Secondary Verification Process Completed: Yes ?Electronic Signature(s) ?Signed: 06/05/2021 4:28:26 PM By: Gretta Cool, BSN, RN, CWS, [name] RN, BSN ?Entered By: Gretta Cool, BSN, RN, CWS, [name] on 06/05/2021 10:03:33 ?[name], [name] ([id number]) ?-------------------------------------------------------------------------------- ?Clinic Level of Care Assessment Details ?Patient Name: [name] ?Date of Service: 06/05/2021 9:45 AM ?Medical Record Number: [id number] ?Patient Account Number: [id number] ?Date of Birth/Sex: October 13, 1940 (81 y.o. F) ?Treating RN: Cornell Barman ?Primary Care Anothy Bufano: Claris Gower Other Clinician: ?Referring Charity Tessier: Claris Gower ?Treating Halil Rentz/Extender: Jeri Cos ?Weeks in Treatment: 7 ?Clinic Level of Care Assessment Items ?TOOL 4 Quantity Score ?'[]'$  - Use when only an EandM is performed on FOLLOW-UP visit 0 ?ASSESSMENTS - Nursing Assessment / Reassessment ?X - Reassessment of Co-morbidities (includes updates in patient status) 1 10 ?X- 1 5 ?Reassessment of Adherence to Treatment Plan ?ASSESSMENTS - Wound and Skin Assessment / Reassessment ?X - Simple Wound Assessment / Reassessment - one wound 1 5 ?'[]'$  - 0 ?Complex Wound Assessment / Reassessment - multiple wounds ?'[]'$  - 0 ?Dermatologic / Skin Assessment (not related to wound  area) ?ASSESSMENTS - Focused Assessment ?'[]'$  - Circumferential Edema Measurements - multi extremities 0 ?'[]'$  - 0 ?Nutritional Assessment / Counseling / Intervention ?'[]'$  - 0 ?Lower Extremity Assessment (monofilament, tuning fork, pulses) ?'[]'$  - 0 ?Peripheral Arterial Disease Assessment (using hand held doppler) ?ASSESSMENTS - Ostomy and/or Continence Assessment and Care ?'[]'$  - Incontinence Assessment and Management 0 ?'[]'$  - 0 ?Ostomy Care Assessment and Management (repouching, etc.) ?PROCESS - Coordination of Care ?X - Simple Patient / Family Education for ongoing care 1 15 ?'[]'$  - 0 ?Complex (extensive) Patient / Family Education for ongoing care ?X- 1 10 ?Staff obtains Consents, Records, Test Results / Process Orders ?'[]'$  - 0 ?Staff telephones HHA, Nursing Homes / Clarify orders / etc ?'[]'$  - 0 ?Routine Transfer to another Facility (non-emergent condition) ?'[]'$  - 0 ?Routine Hospital Admission (non-emergent condition) ?'[]'$  - 0 ?New Admissions / Biomedical engineer / Ordering NPWT, Apligraf, etc. ?'[]'$  - 0 ?Emergency Hospital Admission (emergent condition) ?X- 1 10 ?Simple Discharge Coordination ?'[]'$  - 0 ?Complex (extensive) Discharge Coordination ?PROCESS - Special Needs ?'[]'$  - Pediatric / Minor Patient Management 0 ?'[]'$  - 0 ?Isolation Patient Management ?'[]'$  - 0 ?Hearing / Language / Visual special needs ?'[]'$  - 0 ?Assessment of Community assistance (transportation, D/C planning, etc.) ?'[]'$  - 0 ?Additional assistance / Altered mentation ?'[]'$  - 0 ?Support Surface(s) Assessment (bed, cushion, seat, etc.) ?INTERVENTIONS - Wound Cleansing / Measurement ?[name], [name] ([id number]) ?X- 1 5 ?Simple Wound Cleansing - one wound ?'[]'$  - 0 ?Complex Wound Cleansing - multiple wounds ?X- 1 5 ?Wound Imaging (photographs - any number of wounds) ?'[]'$  - 0 ?Wound Tracing (instead of photographs) ?X- 1 5 ?Simple Wound Measurement - one wound ?'[]'$  - 0 ?Complex Wound Measurement - multiple wounds ?INTERVENTIONS - Wound Dressings ?'[]'$  - Small Wound  Dressing one or multiple wounds 0 ?X- 1 15 ?Medium Wound Dressing one or multiple wounds ?'[]'$  - 0 ?  Large Wound Dressing one or multiple wounds ?'[]'$  - 0 ?Application of Medications - topical ?'[]'$  - 0 ?Application of Medications - injection ?INTERVENTIONS - Miscellaneous ?'[]'$  - External ear exam 0 ?'[]'$  - 0 ?Specimen Collection (cultures, biopsies, blood, body fluids, etc.) ?'[]'$  - 0 ?Specimen(s) / Culture(s) sent or taken to Lab for analysis ?'[]'$  - 0 ?Patient Transfer (multiple staff / Civil Service fast streamer / Similar devices) ?'[]'$  - 0 ?Simple Staple / Suture removal (25 or less) ?'[]'$  - 0 ?Complex Staple / Suture removal (26 or more) ?'[]'$  - 0 ?Hypo / Hyperglycemic Management (close monitor of Blood Glucose) ?'[]'$  - 0 ?Ankle / Brachial Index (ABI) - do not check if billed separately ?X- 1 5 ?Vital Signs ?Has the patient been seen at the hospital within the last three years: Yes ?Total Score: 90 ?Level Of Care: New/Established - Level ?3 ?Electronic Signature(s) ?Signed: 06/05/2021 4:28:26 PM By: Gretta Cool, BSN, RN, CWS, [name] RN, BSN ?Entered By: Gretta Cool, BSN, RN, CWS, [name] on 06/05/2021 10:30:50 ?[name], [name] ([id number]) ?-------------------------------------------------------------------------------- ?Encounter Discharge Information Details ?Patient Name: [name] ?Date of Service: 06/05/2021 9:45 AM ?Medical Record Number: [id number] ?Patient Account Number: [id number] ?Date of Birth/Sex: Apr 02, 1940 (81 y.o. F) ?Treating RN: Cornell Barman ?Primary Care Chayse Gracey: Claris Gower Other Clinician: ?Referring Maven Varelas: Claris Gower ?Treating Joclynn Lumb/Extender: Jeri Cos ?Weeks in Treatment: 7 ?Encounter Discharge Information Items ?Discharge Condition: Stable ?Ambulatory Status: Ambulatory ?Discharge Destination: Home ?Transportation: Private Auto ?Schedule Follow-up Appointment: Yes ?Clinical Summary of Care: ?Electronic Signature(s) ?Signed: 06/05/2021 4:28:26 PM By: Gretta Cool, BSN, RN, CWS, [name] RN, BSN ?Entered By: Gretta Cool, BSN, RN, CWS, [name] on  06/05/2021 10:31:56 ?[name], [name] ([id number]) ?-------------------------------------------------------------------------------- ?Lower Extremity Assessment Details ?Patient Name: [name] ?Date of Service: 06/05/2021 9:45 AM ?Medical Record Number: [id number] ?Patient Account Number: [id number] ?Date of Birth/Sex: 21-Jan-1941 (81 y.o. F) ?Treating RN: Cornell Barman ?Primary Care Markevion Lattin: Claris Gower Other Clinician: ?Referring Harel Repetto: Claris Gower ?Treating Bary Limbach/Extender: Jeri Cos ?Weeks in Treatment: 7 ?Edema Assessment ?Assessed: [Left: No] [Right: No] ?[Left: Edema] [Right: :] ?Calf ?Left: Right: ?Point of Measurement: 30 cm From Medial Instep 41.5 cm ?Ankle ?Left: Right: ?Point of Measurement: 10 cm From Medial Instep 29 cm ?Vascular Assessment ?Pulses: ?Dorsalis Pedis ?Palpable: [Left:Yes] ?Notes ?Patient's legs are red and swollen. Left leg is warm to touch. ?Electronic Signature(s) ?Signed: 06/05/2021 4:28:26 PM By: Gretta Cool, BSN, RN, CWS, [name] RN, BSN ?Entered By: Gretta Cool, BSN, RN, CWS, [name] on 06/05/2021 10:10:24 ?[name], [name] ([id number]) ?-------------------------------------------------------------------------------- ?Multi Wound Chart Details ?Patient Name: [name] ?Date of Service: 06/05/2021 9:45 AM ?Medical Record Number: [id number] ?Patient Account Number: [id number] ?Date of Birth/Sex: 02/14/1941 (81 y.o. F) ?Treating RN: Cornell Barman ?Primary Care Dallon Dacosta: Claris Gower Other Clinician: ?Referring Shaleena Crusoe: Claris Gower ?Treating Mirabelle Cyphers/Extender: Jeri Cos ?Weeks in Treatment: 7 ?Vital Signs ?Height(in): 61 ?Pulse(bpm): 71 ?Weight(lbs): 250 ?Blood Pressure(mmHg): 137/75 ?Body Mass Index(BMI): 47.2 ?Temperature(??F): 98.0 ?Respiratory Rate(breaths/min): 18 ?Photos: [N/A:N/A] ?Wound Location: Left Toe Great N/A N/A ?Wounding Event: Gradually Appeared N/A N/A ?Primary Etiology: Diabetic Wound/Ulcer of the Lower N/A N/A ?Extremity ?Comorbid History: Chronic sinus  problems/congestion, N/A N/A ?Anemia, Sleep Apnea, Congestive ?Heart Failure, Hypertension, Type II ?Diabetes, Neuropathy ?Date Acquired: 02/23/2020 N/A N/A ?Weeks of Treatment: 7 N/A N/A ?Wound Status: Open N/A N/A ?Wound Rec

## 2021-06-11 ENCOUNTER — Encounter: Payer: Medicare Other | Admitting: Physician Assistant

## 2021-06-12 ENCOUNTER — Encounter: Payer: Medicare Other | Admitting: Physician Assistant

## 2021-06-12 DIAGNOSIS — E11621 Type 2 diabetes mellitus with foot ulcer: Secondary | ICD-10-CM | POA: Diagnosis not present

## 2021-06-12 NOTE — Progress Notes (Addendum)
Dallaire, [name] ([id number]) ?Visit Report for 06/12/2021 ?Chief Complaint Document Details ?Patient Name: [name] ?Date of Service: 06/12/2021 9:45 AM ?Medical Record Number: [id number] ?Patient Account Number: [id number] ?Date of Birth/Sex: 1940-07-25 (81 y.o. F) ?Treating RN: Levora Dredge ?Primary Care Provider: Claris Gower Other Clinician: ?Referring Provider: Claris Gower ?Treating Provider/Extender: Jeri Cos ?Weeks in Treatment: 8 ?Information Obtained from: Patient ?Chief Complaint ?Multiple toe ulcersations ?Electronic Signature(s) ?Signed: 06/12/2021 10:18:27 AM By: Worthy Keeler PA-C ?Entered By: Worthy Keeler on 06/12/2021 10:18:26 ?Fei, [name] ([id number]) ?-------------------------------------------------------------------------------- ?Debridement Details ?Patient Name: [name] ?Date of Service: 06/12/2021 9:45 AM ?Medical Record Number: [id number] ?Patient Account Number: [id number] ?Date of Birth/Sex: 1941-01-09 (81 y.o. F) ?Treating RN: Levora Dredge ?Primary Care Provider: Claris Gower Other Clinician: ?Referring Provider: Claris Gower ?Treating Provider/Extender: Jeri Cos ?Weeks in Treatment: 8 ?Debridement Performed for ?Wound #1 Left Toe Great ?Assessment: ?Performed By: Physician Tommie Sams., PA-C ?Debridement Type: Debridement ?Severity of Tissue Pre Debridement: Fat layer exposed ?Level of Consciousness (Pre- ?Awake and Alert ?procedure): ?Pre-procedure Verification/Time Out ?Yes - 10:20 ?Taken: ?Pain Control: Lidocaine 4% Topical Solution ?Total Area Debrided (L x W): 0.6 (cm) x 0.6 (cm) = 0.36 (cm?) ?Tissue and other material ?Viable, Non-Viable, Callus, Slough, Subcutaneous, [place] ?debrided: ?Level: Skin/Subcutaneous Tissue ?Debridement Description: Excisional ?Instrument: Curette ?Bleeding: Moderate ?Hemostasis Achieved: Pressure ?Response to Treatment: Procedure was tolerated well ?Level of Consciousness (Post- ?Awake and Alert ?procedure): ?Post  Debridement Measurements of Total Wound ?Length: (cm) 0.6 ?Width: (cm) 0.6 ?Depth: (cm) 0.1 ?Volume: (cm?) 0.028 ?Character of Wound/Ulcer Post Debridement: Stable ?Severity of Tissue Post Debridement: Fat layer exposed ?Post Procedure Diagnosis ?Same as Pre-procedure ?Electronic Signature(s) ?Signed: 06/12/2021 4:16:23 PM By: Levora Dredge ?Signed: 06/12/2021 5:32:35 PM By: Worthy Keeler PA-C ?Entered By: Levora Dredge on 06/12/2021 10:26:14 ?Dobek, [name] ([id number]) ?-------------------------------------------------------------------------------- ?HPI Details ?Patient Name: [name] ?Date of Service: 06/12/2021 9:45 AM ?Medical Record Number: [id number] ?Patient Account Number: [id number] ?Date of Birth/Sex: 12/26/1940 (81 y.o. F) ?Treating RN: Levora Dredge ?Primary Care Provider: Claris Gower Other Clinician: ?Referring Provider: Claris Gower ?Treating Provider/Extender: Jeri Cos ?Weeks in Treatment: 8 ?History of Present Illness ?HPI Description: 04/17/2021 upon evaluation patient appears to be doing somewhat poorly currently in regard to her first and second toe of the ?left foot. She previously has been seen by Dr. Doran Durand she saw him on 31 January he did not feel there was any evidence of osteomyelitis. He did ?give her a thorough evaluation including x-rays and showed no abnormal findings according to notes. With that being said he felt like that wound ?care would be beneficial therefore she contacted [place]. She has currently been using antibiotic ointment and has noted that this wound on the great ?toe has been there for about a year. She is not certain whether there is anything on the second toe or not she has not really noted any drainage ?but at the same time she cannot be sure there is nothing hiding up underneath a significant callus here. Her feet really do not have any major ?complications with regard to pain and that is good news. She does have neuropathy. ?Patient does have a  history of diabetes mellitus type 2, hypertension, and her most recent hemoglobin A1c was 7.6 on June 2022. ?05/08/2021 upon evaluation today patient appears to be doing poorly in regard to her toes unfortunately she has a lot of callus buildup. Its been ?almost a month since I last saw her. Obviously in this amount of  time she has built up quite a bit of callus she was sick that is the reason she did ?not make it in. Nonetheless I do think that weekly visits is probably can be the best thing is asking to help to allow these areas to heal much more ?effectively and quickly. Fortunately I do not see any evidence of active infection locally or systemically which is great news. ?05/15/2021 upon evaluation today patient appears to be doing well currently in regard to her wound. Overall I think that we are definitely headed ?in the right direction with regard to the toes. Fortunately I do not see any signs of active infection locally or systemically at this time which is ?great news. No fevers, chills, nausea, vomiting, or diarrhea. ?05/22/2021 upon evaluation today patient appears to be doing well with regard to her wound. She has been tolerating the dressing changes ?without complication. Fortunately the second toe is healed the first toe on the left foot is still open but does not appear to be doing nearly as ?poorly at this time. I do think we can perform a little bit of debridement in order to clear away some of the necrotic debris patient is in agreement ?with that plan. ?05-29-2021 upon evaluation today patient appears to be doing worse currently in regard to the pain experience. Her leg is still very red in fact I ?think is a little bit worse than last week unfortunately. Last week I was even concerned about a little bit of cellulitis she really felt like it was more ?related to her swelling but again I am not certain that that is necessarily the case. Fortunately I do not see any signs of active infection at  this ?time systemically though locally I am definitely concerned in this regard. ?06-05-2021 upon evaluation today patient appears to be doing well with regard to her toe ulcer this is worse is also not better though. ?Unfortunately it is hurting more in her leg in general is also bothering her. This is despite being. She is also tolerating Levaquin with making her ?dizzy. Nonetheless I am not certain its helping after she has been on this week I am not seeing much of the improvement with regard to the ?cellulitis in her leg. She is also previously been on doxycycline although that was towards the end of February. ?06-12-2021 upon evaluation today patient appears to be doing well with regard to her wound. We are definitely seeing signs of improvement with ?regard to size. With that being said I do not see any evidence of active infection at this time which is great news and very pleased in that regard. ?She is going require some debridement both for callus as well as clearing away some of the slough and biofilm. ?Electronic Signature(s) ?Signed: 06/12/2021 10:30:08 AM By: Worthy Keeler PA-C ?Entered By: Worthy Keeler on 06/12/2021 10:30:07 ?Meech, [name] ([id number]) ?-------------------------------------------------------------------------------- ?Physical Exam Details ?Patient Name: [name] ?Date of Service: 06/12/2021 9:45 AM ?Medical Record Number: [id number] ?Patient Account Number: [id number] ?Date of Birth/Sex: 1940-12-27 (81 y.o. F) ?Treating RN: Levora Dredge ?Primary Care Provider: Claris Gower Other Clinician: ?Referring Provider: Claris Gower ?Treating Provider/Extender: Jeri Cos ?Weeks in Treatment: 8 ?Constitutional ?Well-nourished and well-hydrated in no acute distress. ?Respiratory ?normal breathing without difficulty. ?Psychiatric ?this patient is able to make decisions and demonstrates good insight into disease process. Alert and Oriented x 3. pleasant and cooperative. ?Notes ?Upon  inspection patient's wound bed showed signs of excellent granulation and epithelization at  this point. She did have again a layer of biofilm ?and slough which was noted on the surface of the wound. The good news i

## 2021-06-12 NOTE — Progress Notes (Signed)
Blum, [name] ([id number]) ?Visit Report for 06/12/2021 ?Arrival Information Details ?Patient Name: [name] ?Date of Service: 06/12/2021 9:45 AM ?Medical Record Number: [id number] ?Patient Account Number: [id number] ?Date of Birth/Sex: January 12, 1941 (81 y.o. F) ?Treating RN: Levora Dredge ?Primary Care Marissah Vandemark: Claris Gower Other Clinician: ?Referring Bohdan Macho: Claris Gower ?Treating Shalimar Mcclain/Extender: Jeri Cos ?Weeks in Treatment: 8 ?Visit Information History Since Last Visit ?Added or deleted any medications: No ?Patient Arrived: [name] ?Any new allergies or adverse reactions: No ?Arrival Time: 10:07 ?Had a fall or experienced change in No ?Accompanied By: self ?activities of daily living that may affect ?Transfer Assistance: None ?risk of falls: ?Patient Identification Verified: Yes ?Hospitalized since last visit: No ?Secondary Verification Process Completed: Yes ?Has Dressing in Place as Prescribed: Yes ?Pain Present Now: No ?Electronic Signature(s) ?Signed: 06/12/2021 4:16:23 PM By: Levora Dredge ?Entered By: Levora Dredge on 06/12/2021 10:07:29 ?Mechling, [name] ([id number]) ?-------------------------------------------------------------------------------- ?Clinic Level of Care Assessment Details ?Patient Name: [name] ?Date of Service: 06/12/2021 9:45 AM ?Medical Record Number: [id number] ?Patient Account Number: [id number] ?Date of Birth/Sex: 05/03/1940 (81 y.o. F) ?Treating RN: Levora Dredge ?Primary Care Vickki Igou: Claris Gower Other Clinician: ?Referring Chevy Sweigert: Claris Gower ?Treating Alisabeth Selkirk/Extender: Jeri Cos ?Weeks in Treatment: 8 ?Clinic Level of Care Assessment Items ?TOOL 1 Quantity Score ?'[]'$  - Use when EandM and Procedure is performed on INITIAL visit 0 ?ASSESSMENTS - Nursing Assessment / Reassessment ?'[]'$  - General Physical Exam (combine w/ comprehensive assessment (listed just below) when performed on new ?0 ?pt. evals) ?'[]'$  - 0 ?Comprehensive Assessment (HX, ROS, Risk  Assessments, Wounds Hx, etc.) ?ASSESSMENTS - Wound and Skin Assessment / Reassessment ?'[]'$  - Dermatologic / Skin Assessment (not related to wound area) 0 ?ASSESSMENTS - Ostomy and/or Continence Assessment and Care ?'[]'$  - Incontinence Assessment and Management 0 ?'[]'$  - 0 ?Ostomy Care Assessment and Management (repouching, etc.) ?PROCESS - Coordination of Care ?'[]'$  - Simple Patient / Family Education for ongoing care 0 ?'[]'$  - 0 ?Complex (extensive) Patient / Family Education for ongoing care ?'[]'$  - 0 ?Staff obtains Consents, Records, Test Results / Process Orders ?'[]'$  - 0 ?Staff telephones HHA, Nursing Homes / Clarify orders / etc ?'[]'$  - 0 ?Routine Transfer to another Facility (non-emergent condition) ?'[]'$  - 0 ?Routine Hospital Admission (non-emergent condition) ?'[]'$  - 0 ?New Admissions / Biomedical engineer / Ordering NPWT, Apligraf, etc. ?'[]'$  - 0 ?Emergency Hospital Admission (emergent condition) ?PROCESS - Special Needs ?'[]'$  - Pediatric / Minor Patient Management 0 ?'[]'$  - 0 ?Isolation Patient Management ?'[]'$  - 0 ?Hearing / Language / Visual special needs ?'[]'$  - 0 ?Assessment of Community assistance (transportation, D/C planning, etc.) ?'[]'$  - 0 ?Additional assistance / Altered mentation ?'[]'$  - 0 ?Support Surface(s) Assessment (bed, cushion, seat, etc.) ?INTERVENTIONS - Miscellaneous ?'[]'$  - External ear exam 0 ?'[]'$  - 0 ?Patient Transfer (multiple staff / Civil Service fast streamer / Similar devices) ?'[]'$  - 0 ?Simple Staple / Suture removal (25 or less) ?'[]'$  - 0 ?Complex Staple / Suture removal (26 or more) ?'[]'$  - 0 ?Hypo/Hyperglycemic Management (do not check if billed separately) ?'[]'$  - 0 ?Ankle / Brachial Index (ABI) - do not check if billed separately ?Has the patient been seen at the hospital within the last three years: Yes ?Total Score: 0 ?Level Of Care: ____ ?Dolin, [name] ([id number]) ?Electronic Signature(s) ?Signed: 06/12/2021 4:16:23 PM By: Levora Dredge ?Entered By: Levora Dredge on 06/12/2021 10:21:52 ?Fouche, [name]  ([id number]) ?-------------------------------------------------------------------------------- ?Encounter Discharge Information Details ?Patient Name: [name] ?Date of Service: 06/12/2021 9:45 AM ?Medical Record Number:  [id number] ?Patient Account Number: [id number] ?Date of Birth/Sex: March 19, 1940 (81 y.o. F) ?Treating RN: Levora Dredge ?Primary Care Salvador Coupe: Claris Gower Other Clinician: ?Referring Alany Borman: Claris Gower ?Treating Quyen Cutsforth/Extender: Jeri Cos ?Weeks in Treatment: 8 ?Encounter Discharge Information Items Post Procedure Vitals ?Discharge Condition: Stable ?Temperature (?F): 98.3 ?Ambulatory Status: [name] ?Pulse (bpm): 86 ?Discharge Destination: Home ?Respiratory Rate (breaths/min): 18 ?Transportation: Private Auto ?Blood Pressure (mmHg): 160/72 ?Accompanied By: self/son ?Schedule Follow-up Appointment: Yes ?Clinical Summary of Care: Patient Declined ?Electronic Signature(s) ?Signed: 06/12/2021 4:16:23 PM By: Levora Dredge ?Entered By: Levora Dredge on 06/12/2021 10:23:24 ?Kervin, [name] ([id number]) ?-------------------------------------------------------------------------------- ?Lower Extremity Assessment Details ?Patient Name: [name] ?Date of Service: 06/12/2021 9:45 AM ?Medical Record Number: [id number] ?Patient Account Number: [id number] ?Date of Birth/Sex: May 11, 1940 (81 y.o. F) ?Treating RN: Levora Dredge ?Primary Care Ardene Remley: Claris Gower Other Clinician: ?Referring Nalda Shackleford: Claris Gower ?Treating Cleona Doubleday/Extender: Jeri Cos ?Weeks in Treatment: 8 ?Edema Assessment ?Assessed: [Left: No] [Right: No] ?Edema: [Left: Ye] [Right: s] ?Calf ?Left: Right: ?Point of Measurement: 30 cm From Medial Instep 39.5 cm ?Ankle ?Left: Right: ?Point of Measurement: 10 cm From Medial Instep 25.8 cm ?Vascular Assessment ?Pulses: ?Dorsalis Pedis ?Palpable: [Left:Yes] ?Electronic Signature(s) ?Signed: 06/12/2021 4:16:23 PM By: Levora Dredge ?Entered By: Levora Dredge on 06/12/2021  10:10:58 ?Krizan, [name] ([id number]) ?-------------------------------------------------------------------------------- ?Multi Wound Chart Details ?Patient Name: [name] ?Date of Service: 06/12/2021 9:45 AM ?Medical Record Number: [id number] ?Patient Account Number: [id number] ?Date of Birth/Sex: 01-Feb-1941 (81 y.o. 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 ?Comorbid History: Chronic sinus problems/congestion, N/A N/A ?Anemia, Sleep Apnea, Congestive ?Heart Failure, Hypertension, Type II ?Diabetes, Neuropathy ?Date Acquired: 02/23/2020 N/A N/A ?Weeks of Treatment: 8 N/A N/A ?Wound Status: Open N/A N/A ?Wound Recurrence: No N/A N/A ?Measurements L x W x D (cm) 0.6x0.6x0.1 N/A N/A ?Area (cm?) : 0.283 N/A N/A ?Volume (cm?) : 0.028 N/A N/A ?% Reduction in Area: 94.00% N/A N/A ?% Reduction in Volume: 94.10% N/A N/A ?Classification: Grade 1 N/A N/A ?Exudate Amount: Medium N/A N/A ?Exudate Type: Serosanguineous N/A N/A ?Exudate Color: red, brown N/A N/A ?Wound Margin: Thickened N/A N/A ?Granulation Amount: Large (67-100%) N/A N/A ?Granulation Quality: Pink, Pale N/A N/A ?Necrotic Amount: Small (1-33%) N/A N/A ?Exposed Structures: ?Fat Layer (Subcutaneous Tissue): N/A N/A ?Yes ?Epithelialization: None N/A N/A ?Treatment Notes ?Electronic Signature(s) ?Signed: 06/12/2021 4:16:23 PM By: Levora Dredge ?Entered By: Levora Dredge on 06/12/2021 10:19:39 ?Manke, [name]  ([id number]) ?-------------------------------------------------------------------------------- ?Multi-Disciplinary Care Plan Details ?Patient Name: [name] ?Date of Service: 06/12/2021 9:45 AM ?Medical Record Number: [id number] ?Patient Account Number: [id number] ?Dat

## 2021-06-18 ENCOUNTER — Encounter: Payer: Medicare Other | Admitting: Physician Assistant

## 2021-06-19 ENCOUNTER — Ambulatory Visit: Payer: Medicare Other | Admitting: Physician Assistant

## 2021-06-24 ENCOUNTER — Ambulatory Visit: Payer: Medicare Other | Admitting: Internal Medicine

## 2021-07-03 ENCOUNTER — Encounter: Payer: Medicare Other | Attending: Physician Assistant | Admitting: Physician Assistant

## 2021-07-03 DIAGNOSIS — E11621 Type 2 diabetes mellitus with foot ulcer: Secondary | ICD-10-CM | POA: Diagnosis not present

## 2021-07-03 DIAGNOSIS — Z89421 Acquired absence of other right toe(s): Secondary | ICD-10-CM | POA: Diagnosis not present

## 2021-07-03 DIAGNOSIS — L97522 Non-pressure chronic ulcer of other part of left foot with fat layer exposed: Secondary | ICD-10-CM | POA: Insufficient documentation

## 2021-07-03 DIAGNOSIS — I110 Hypertensive heart disease with heart failure: Secondary | ICD-10-CM | POA: Diagnosis not present

## 2021-07-03 DIAGNOSIS — E1140 Type 2 diabetes mellitus with diabetic neuropathy, unspecified: Secondary | ICD-10-CM | POA: Diagnosis not present

## 2021-07-03 DIAGNOSIS — Z89411 Acquired absence of right great toe: Secondary | ICD-10-CM | POA: Diagnosis not present

## 2021-07-03 DIAGNOSIS — I509 Heart failure, unspecified: Secondary | ICD-10-CM | POA: Insufficient documentation

## 2021-07-03 NOTE — Progress Notes (Addendum)
[name], [name] ([id number]) ?Visit Report for 07/03/2021 ?Chief Complaint Document Details ?Patient Name: [name] ?Date of Service: 07/03/2021 8:00 AM ?Medical Record Number: [id number] ?Patient Account Number: [id number] ?Date of Birth/Sex: [birth year] (81 y.o. F) ?Treating RN: Cornell Barman ?Primary Care Provider: Claris Gower Other Clinician: ?Referring Provider: Claris Gower ?Treating Provider/Extender: Jeri Cos ?Weeks in Treatment: 11 ?Information Obtained from: Patient ?Chief Complaint ?Multiple toe ulcersations ?Electronic Signature(s) ?Signed: 07/03/2021 8:17:54 AM By: Worthy Keeler PA-C ?Entered By: Worthy Keeler on 07/03/2021 08:17:53 ?[name], [name] ([id number]) ?-------------------------------------------------------------------------------- ?Debridement Details ?Patient Name: [name] ?Date of Service: 07/03/2021 8:00 AM ?Medical Record Number: [id number] ?Patient Account Number: [id number] ?Date of Birth/Sex: 22-Aug-1940 (81 y.o. F) ?Treating RN: Carlene Coria ?Primary Care Provider: Claris Gower Other Clinician: ?Referring Provider: Claris Gower ?Treating Provider/Extender: Jeri Cos ?Weeks in Treatment: 11 ?Debridement Performed for ?Wound #1 Left Toe Great ?Assessment: ?Performed By: Physician Tommie Sams., PA-C ?Debridement Type: Debridement ?Severity of Tissue Pre Debridement: Fat layer exposed ?Level of Consciousness (Pre- ?Awake and Alert ?procedure): ?Pre-procedure Verification/Time Out ?Yes - 08:40 ?Taken: ?Start Time: 08:40 ?Pain Control: Lidocaine 4% Topical Solution ?Total Area Debrided (L x W): 1 (cm) x 1 (cm) = 1 (cm?) ?Tissue and other material ?Callus, Subcutaneous ?debrided: ?Level: Skin/Subcutaneous Tissue ?Debridement Description: Excisional ?Instrument: Curette ?Bleeding: Moderate ?Hemostasis Achieved: Silver Nitrate ?End Time: 08:48 ?Procedural Pain: 0 ?Post Procedural Pain: 0 ?Response to Treatment: Procedure was tolerated well ?Level of Consciousness  (Post- ?Awake and Alert ?procedure): ?Post Debridement Measurements of Total Wound ?Length: (cm) 1 ?Width: (cm) 0.5 ?Depth: (cm) 0.1 ?Volume: (cm?) 0.039 ?Character of Wound/Ulcer Post Debridement: Improved ?Severity of Tissue Post Debridement: Fat layer exposed ?Post Procedure Diagnosis ?Same as Pre-procedure ?Electronic Signature(s) ?Signed: 07/03/2021 6:04:02 PM By: Worthy Keeler PA-C ?Signed: 07/07/2021 10:35:39 AM By: Carlene Coria RN ?Entered By: Carlene Coria on 07/03/2021 08:50:01 ?[name], [name] ([id number]) ?-------------------------------------------------------------------------------- ?HPI Details ?Patient Name: [name] ?Date of Service: 07/03/2021 8:00 AM ?Medical Record Number: [id number] ?Patient Account Number: [id number] ?Date of Birth/Sex: 1940-02-26 (81 y.o. F) ?Treating RN: Cornell Barman ?Primary Care Provider: Claris Gower Other Clinician: ?Referring Provider: Claris Gower ?Treating Provider/Extender: Jeri Cos ?Weeks in Treatment: 11 ?History of Present Illness ?HPI Description: 04/17/2021 upon evaluation patient appears to be doing somewhat poorly currently in regard to her first and second toe of the ?left foot. She previously has been seen by Dr. Doran Durand she saw him on 31 January he did not feel there was any evidence of osteomyelitis. He did ?give her a thorough evaluation including x-rays and showed no abnormal findings according to notes. With that being said he felt like that wound ?care would be beneficial therefore she contacted [place]. She has currently been using antibiotic ointment and has noted that this wound on the great ?toe has been there for about a year. She is not certain whether there is anything on the second toe or not she has not really noted any drainage ?but at the same time she cannot be sure there is nothing hiding up underneath a significant callus here. Her feet really do not have any major ?complications with regard to pain and that is good news. She does have  neuropathy. ?Patient does have a history of diabetes mellitus type 2, hypertension, and her most recent hemoglobin A1c was 7.6 on June 2022. ?05/08/2021 upon evaluation today patient appears to be doing poorly in regard to her toes unfortunately she has a lot of callus buildup. Its been ?almost a  month since I last saw her. Obviously in this amount of time she has built up quite a bit of callus she was sick that is the reason she did ?not make it in. Nonetheless I do think that weekly visits is probably can be the best thing is asking to help to allow these areas to heal much more ?effectively and quickly. Fortunately I do not see any evidence of active infection locally or systemically which is great news. ?05/15/2021 upon evaluation today patient appears to be doing well currently in regard to her wound. Overall I think that we are definitely headed ?in the right direction with regard to the toes. Fortunately I do not see any signs of active infection locally or systemically at this time which is ?great news. No fevers, chills, nausea, vomiting, or diarrhea. ?05/22/2021 upon evaluation today patient appears to be doing well with regard to her wound. She has been tolerating the dressing changes ?without complication. Fortunately the second toe is healed the first toe on the left foot is still open but does not appear to be doing nearly as ?poorly at this time. I do think we can perform a little bit of debridement in order to clear away some of the necrotic debris patient is in agreement ?with that plan. ?05-29-2021 upon evaluation today patient appears to be doing worse currently in regard to the pain experience. Her leg is still very red in fact I ?think is a little bit worse than last week unfortunately. Last week I was even concerned about a little bit of cellulitis she really felt like it was more ?related to her swelling but again I am not certain that that is necessarily the case. Fortunately I do not see any  signs of active infection at this ?time systemically though locally I am definitely concerned in this regard. ?06-05-2021 upon evaluation today patient appears to be doing well with regard to her toe ulcer this is worse is also not better though. ?Unfortunately it is hurting more in her leg in general is also bothering her. This is despite being. She is also tolerating Levaquin with making her ?dizzy. Nonetheless I am not certain its helping after she has been on this week I am not seeing much of the improvement with regard to the ?cellulitis in her leg. She is also previously been on doxycycline although that was towards the end of February. ?06-12-2021 upon evaluation today patient appears to be doing well with regard to her wound. We are definitely seeing signs of improvement with ?regard to size. With that being said I do not see any evidence of active infection at this time which is great news and very pleased in that regard. ?She is going require some debridement both for callus as well as clearing away some of the slough and biofilm. ?07-03-2021 upon evaluation today patient appears to be doing well with regard to his wound on the foot. With that being said he unfortunately did ?have some of the padding along the heel that got pushed down and subsequently he tells me that it was feeling like it was bunched up under his ?foot and the heel location and bothering him over the past couple of days. With that being said that when we remove the cast today unbeknownst ?to [facility] the padding had been pushed down and the cast saw actually nicked his heel this looks like a very light abrasion. Nonetheless he does ?not have any pain and there is no active bleeding at  this time. I actually did take a picture to show him as well and we documented it we will see ?how this looks obviously next week but I think this will be healed by Wednesday. The wound itself looks to be doing much better there is some ?callus to remove some  slough and biofilm as well. ?Electronic Signature(s) ?Signed: 07/03/2021 8:58:35 AM By: Worthy Keeler PA-C ?Entered By: Worthy Keeler on 07/03/2021 08:58:34 ?[name], [name] ([id number]) ?---------------------

## 2021-07-03 NOTE — Progress Notes (Addendum)
[name], [name] ([id number]) ?Visit Report for 07/03/2021 ?Arrival Information Details ?Patient Name: [name] ?Date of Service: 07/03/2021 8:00 AM ?Medical Record Number: [id number] ?Patient Account Number: [id number] ?Date of Birth/Sex: 18-Dec-1940 (81 y.o. F) ?Treating RN: [name] ?Primary Care [name]: [name] Other Clinician: ?Referring [name]: [name] ?Treating [name]/Extender: [name] ?Weeks in Treatment: 11 ?Visit Information History Since Last Visit ?All ordered tests and consults were completed: No ?Patient Arrived: [name] ?Added or deleted any medications: No ?Arrival Time: 08:17 ?Any new allergies or adverse reactions: No ?Accompanied By: self ?Had a fall or experienced change in No ?Transfer Assistance: None ?activities of daily living that may affect ?Patient Identification Verified: Yes ?risk of falls: ?Secondary Verification Process Completed: Yes ?Signs or symptoms of abuse/neglect since last visito No ?Patient Requires Transmission-Based Precautions: No ?Hospitalized since last visit: No ?Patient Has Alerts: No ?Implantable device outside of the clinic excluding No ?cellular tissue based products placed in the center ?since last visit: ?Has Dressing in Place as Prescribed: Yes ?Pain Present Now: Yes ?Electronic Signature(s) ?Signed: 07/07/2021 10:35:39 AM By: Cheryl Coria RN ?Entered ByCarlene [name] on 07/03/2021 08:18:18 ?Wisher, [name] ([id number]) ?-------------------------------------------------------------------------------- ?Clinic Level of Care Assessment Details ?Patient Name: [name] ?Date of Service: 07/03/2021 8:00 AM ?Medical Record Number: [id number] ?Patient Account Number: [id number] ?Date of Birth/Sex: January 05, 1941 (81 y.o. F) ?Treating RN: [name] ?Primary Care Kamille Toomey: [name] Other Clinician: ?Referring [name]: [name] ?Treating [name]/Extender: [name] ?Weeks in Treatment: 11 ?Clinic Level of Care Assessment  Items ?TOOL 1 Quantity Score ?'[]'$  - Use when EandM and Procedure is performed on INITIAL visit 0 ?ASSESSMENTS - Nursing Assessment / Reassessment ?'[]'$  - General Physical Exam (combine w/ comprehensive assessment (listed just below) when performed on new ?0 ?pt. evals) ?'[]'$  - 0 ?Comprehensive Assessment (HX, ROS, Risk Assessments, Wounds Hx, etc.) ?ASSESSMENTS - Wound and Skin Assessment / Reassessment ?'[]'$  - Dermatologic / Skin Assessment (not related to wound area) 0 ?ASSESSMENTS - Ostomy and/or Continence Assessment and Care ?'[]'$  - Incontinence Assessment and Management 0 ?'[]'$  - 0 ?Ostomy Care Assessment and Management (repouching, etc.) ?PROCESS - Coordination of Care ?'[]'$  - Simple Patient / Family Education for ongoing care 0 ?'[]'$  - 0 ?Complex (extensive) Patient / Family Education for ongoing care ?'[]'$  - 0 ?Staff obtains Consents, Records, Test Results / Process Orders ?'[]'$  - 0 ?Staff telephones HHA, Nursing Homes / Clarify orders / etc ?'[]'$  - 0 ?Routine Transfer to another Facility (non-emergent condition) ?'[]'$  - 0 ?Routine Hospital Admission (non-emergent condition) ?'[]'$  - 0 ?New Admissions / Biomedical engineer / Ordering NPWT, Apligraf, etc. ?'[]'$  - 0 ?Emergency Hospital Admission (emergent condition) ?PROCESS - Special Needs ?'[]'$  - Pediatric / Minor Patient Management 0 ?'[]'$  - 0 ?Isolation Patient Management ?'[]'$  - 0 ?Hearing / Language / Visual special needs ?'[]'$  - 0 ?Assessment of Community assistance (transportation, D/C planning, etc.) ?'[]'$  - 0 ?Additional assistance / Altered mentation ?'[]'$  - 0 ?Support Surface(s) Assessment (bed, cushion, seat, etc.) ?INTERVENTIONS - Miscellaneous ?'[]'$  - External ear exam 0 ?'[]'$  - 0 ?Patient Transfer (multiple staff / Civil Service fast streamer / Similar devices) ?'[]'$  - 0 ?Simple Staple / Suture removal (25 or less) ?'[]'$  - 0 ?Complex Staple / Suture removal (26 or more) ?'[]'$  - 0 ?Hypo/Hyperglycemic Management (do not check if billed separately) ?'[]'$  - 0 ?Ankle / Brachial Index (ABI) - do not check if  billed separately ?Has the patient been seen at the hospital within the last three years: Yes ?Total Score: 0 ?Level  Of Care: ____ ?[name], [name] ([id number]) ?Electronic Signature(s) ?Signed: 07/07/2021 10:35:39 AM By: Cheryl Coria RN ?Entered By: [name] on 07/03/2021 08:48:03 ?[name], [name] ([id number]) ?-------------------------------------------------------------------------------- ?Encounter Discharge Information Details ?Patient Name: [name] ?Date of Service: 07/03/2021 8:00 AM ?Medical Record Number: [id number] ?Patient Account Number: [id number] ?Date of Birth/Sex: [birth year] (81 y.o. F) ?Treating RN: [name] ?Primary Care Montina Dorrance: [name] Other Clinician: ?Referring [name]: [name] ?Treating [name]/Extender: [name] ?Weeks in Treatment: 11 ?Encounter Discharge Information Items Post Procedure Vitals ?Discharge Condition: Stable ?Temperature (?F): 98.2 ?Ambulatory Status: [name] ?Pulse (bpm): 98 ?Discharge Destination: Home ?Respiratory Rate (breaths/min): 18 ?Transportation: Private Auto ?Blood Pressure (mmHg): 190/82 ?Accompanied By: self ?Schedule Follow-up Appointment: Yes ?Clinical Summary of Care: Patient Declined ?Electronic Signature(s) ?Signed: 07/07/2021 10:35:39 AM By: Cheryl Coria RN ?Entered By: [name] on 07/03/2021 08:51:01 ?[name], [name] ([id number]) ?-------------------------------------------------------------------------------- ?Lower Extremity Assessment Details ?Patient Name: [name] ?Date of Service: 07/03/2021 8:00 AM ?Medical Record Number: [id number] ?Patient Account Number: [id number] ?Date of Birth/Sex: 20-Jan-1941 (81 y.o. F) ?Treating RN: [name] ?Primary Care [name]: [name] Other Clinician: ?Referring [name]: [name] ?Treating [name]/Extender: [name] ?Weeks in Treatment: 11 ?Edema Assessment ?Assessed: [Left: No] [Right: No] ?Edema: [Left: Ye] [Right: s] ?Calf ?Left: Right: ?Point of Measurement:  30 cm From Medial Instep 39 cm ?Ankle ?Left: Right: ?Point of Measurement: 10 cm From Medial Instep 24 cm ?Vascular Assessment ?Pulses: ?Dorsalis Pedis ?Palpable: [Left:Yes] ?Electronic Signature(s) ?Signed: 07/07/2021 10:35:39 AM By: Cheryl Coria RN ?Entered ByCarlene [name] on 07/03/2021 08:23:25 ?Whipkey, [name] ([id number]) ?-------------------------------------------------------------------------------- ?Multi Wound Chart Details ?Patient Name: [name] ?Date of Service: 07/03/2021 8:00 AM ?Medical Record Number: [id number] ?Patient Account Number: [id number] ?Date of Birth/Sex: [birth year] (81 y.o. 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] ?Comorbid History: [1:Chronic sinus problems/congestion, N/A Anemia, Sleep Apnea, Congestive Heart Failure, Hypertension, Type II Diabetes, Neuropathy] ?Date Acquired: [1:02/23/2020] [N/A:N/A] ?Weeks of Treatment: [1:11] [N/A:N/A] ?Wound Status: [1:Open] [N/A:N/A] ?Wound Recurrence: [1:No] [N/A:N/A] ?Measurements L x W x D (cm) [1:0.6x0.4x0.1] [N/A:N/A] ?Area (cm?) : [1:0.188] [N/A:N/A] ?Volume (cm?) : [1:0.019] [N/A:N/A] ?% Reduction in Area: [1:96.00%] [N/A:N/A] ?% Reduction in Volume: [1:96.00%] [N/A:N/A] ?Classification: [1:Grade 1] [N/A:N/A] ?Exudate Amount: [1:Medium] [N/A:N/A] ?Exudate Type: [1:Serosanguineous] [N/A:N/A] ?Exudate Color: [1:red, brown] [N/A:N/A] ?Wound Margin: [1:Thickened] [N/A:N/A] ?Granulation Amount: [1:Large (67-100%)]  [N/A:N/A] ?Granulation Quality: [1:Pink, Pale] [N/A:N/A] ?Necrotic Amount: [1:Small (1-33%)] [N/A:N/A] ?Exposed Structures: ?[1:Fat Layer (Subcutaneous Tissue): N/A Yes None] [N/A:N/A] ?Treatment Notes ?Electronic Signature(s)

## 2021-07-06 ENCOUNTER — Other Ambulatory Visit: Payer: Self-pay | Admitting: Physician Assistant

## 2021-07-06 ENCOUNTER — Other Ambulatory Visit (HOSPITAL_COMMUNITY): Payer: Self-pay | Admitting: Physician Assistant

## 2021-07-06 DIAGNOSIS — E11621 Type 2 diabetes mellitus with foot ulcer: Secondary | ICD-10-CM

## 2021-07-10 ENCOUNTER — Encounter: Payer: Medicare Other | Admitting: Physician Assistant

## 2021-07-10 DIAGNOSIS — E11621 Type 2 diabetes mellitus with foot ulcer: Secondary | ICD-10-CM | POA: Diagnosis not present

## 2021-07-11 NOTE — Progress Notes (Signed)
[name] ([id number]) Visit Report for 07/10/2021 Arrival Information Details Patient Name: [name], [name] Date of Service: 07/10/2021 9:00 AM Medical Record Number: [id number] Patient Account Number: [id number] Date of Birth/Sex: Jan 30, 1941 (81 y.o. F) Treating RN: Levora Dredge Primary Care Linsy Ehresman: Claris Gower Other Clinician: Referring Dace Denn: Claris Gower Treating Netanel Yannuzzi/Extender: Skipper Cliche in Treatment: 12 Visit Information History Since Last Visit Added or deleted any medications: No Patient Arrived: [name] Any new allergies or adverse reactions: No Arrival Time: 09:00 Had a fall or experienced change in No Accompanied By: self activities of daily living that may affect Transfer Assistance: None risk of falls: Patient Identification Verified: Yes Hospitalized since last visit: No Secondary Verification Process Completed: Yes Has Dressing in Place as Prescribed: Yes Patient Requires Transmission-Based Precautions: No Pain Present Now: No Patient Has Alerts: No Electronic Signature(s) Signed: 07/10/2021 1:51:47 PM By: Levora Dredge Entered By: Levora Dredge on 07/10/2021 09:02:03 Perno, Kayren [name] ([id number]) -------------------------------------------------------------------------------- Clinic Level of Care Assessment Details Patient Name: [name] Date of Service: 07/10/2021 9:00 AM Medical Record Number: [id number] Patient Account Number: [id number] Date of Birth/Sex: 1940/10/22 (81 y.o. F) Treating RN: Levora Dredge Primary Care Zoeya Gramajo: Claris Gower Other Clinician: Referring Kima Malenfant: Claris Gower Treating Aesha Agrawal/Extender: Skipper Cliche in Treatment: 12 Clinic Level of Care Assessment Items TOOL 1 Quantity Score _0  - Use when EandM and Procedure is performed on INITIAL visit 0 ASSESSMENTS - Nursing Assessment / Reassessment _1  - General Physical Exam (combine w/ comprehensive assessment (listed just below) when  performed on new 0 pt. evals) _2  - 0 Comprehensive Assessment (HX, ROS, Risk Assessments, Wounds Hx, etc.) ASSESSMENTS - Wound and Skin Assessment / Reassessment _3  - Dermatologic / Skin Assessment (not related to wound area) 0 ASSESSMENTS - Ostomy and/or Continence Assessment and Care _4  - Incontinence Assessment and Management 0 _5  - 0 Ostomy Care Assessment and Management (repouching, etc.) PROCESS - Coordination of Care _6  - Simple Patient / Family Education for ongoing care 0 _7  - 0 Complex (extensive) Patient / Family Education for ongoing care _8  - 0 Staff obtains Programmer, systems, Records, Test Results / Process Orders _9  - 0 Staff telephones HHA, Nursing Homes / Clarify orders / etc _10  - 0 Routine Transfer to another Facility (non-emergent condition) _11  - 0 Routine Hospital Admission (non-emergent condition) _12  - 0 New Admissions / Biomedical engineer / Ordering NPWT, Apligraf, etc. _13  - 0 Emergency Hospital Admission (emergent condition) PROCESS - Special Needs _14  - Pediatric / Minor Patient Management 0 _15  - 0 Isolation Patient Management _16  - 0 Hearing / Language / Visual special needs _17  - 0 Assessment of Community assistance (transportation, D/C planning, etc.) _18  - 0 Additional assistance / Altered mentation _19  - 0 Support Surface(s) Assessment (bed, cushion, seat, etc.) INTERVENTIONS - Miscellaneous _20  - External ear exam 0 _21  - 0 Patient Transfer (multiple staff / Civil Service fast streamer / Similar devices) _22  - 0 Simple Staple / Suture removal (25 or less) _23  - 0 Complex Staple / Suture removal (26 or more) _24  - 0 Hypo/Hyperglycemic Management (do not check if billed separately) _25  - 0 Ankle / Brachial Index (ABI) - do not check if billed separately Has the patient been seen at the hospital within the last three years: Yes Total Score: 0 Level Of Care: ____ [name] ([id number]) Electronic Signature(s) Signed: 07/10/2021 1:51:47 PM By: Levora Dredge Entered By: Levora Dredge on 07/10/2021 09:44:09 Meland, Kayren [name] ([id number]) -------------------------------------------------------------------------------- Encounter Discharge Information Details Patient Name: Cheryl Booze A.  Date of Service: 07/10/2021 9:00 AM Medical Record Number: [id number] Patient Account Number: [id number] Date of Birth/Sex: 09/01/1940 (81 y.o. F) Treating RN: Levora Dredge Primary Care Aamina Skiff: Claris Gower Other Clinician: Referring Chaniqua Brisby: Claris Gower Treating Gena Laski/Extender: Skipper Cliche in Treatment: 12 Encounter Discharge Information Items Post Procedure Vitals Discharge Condition: Stable Temperature (F): 97.7 Ambulatory Status: Cane Pulse (bpm): 80 Discharge Destination: Home Respiratory Rate (breaths/min): 18 Transportation: Other Blood Pressure (mmHg): 171/61 Accompanied By: self Schedule Follow-up Appointment: Yes Clinical Summary of Care: Patient Declined Electronic Signature(s) Signed: 07/10/2021 1:51:47 PM By: Levora Dredge Entered By: Levora Dredge on 07/10/2021 09:45:08 [place]burn, Kayren [name] ([id number]) -------------------------------------------------------------------------------- Lower Extremity Assessment Details Patient Name: [name] Date of Service: 07/10/2021 9:00 AM Medical Record Number: [id number] Patient Account Number: [id number] Date of Birth/Sex: May 16, 1940 (81 y.o. F) Treating RN: Levora Dredge Primary Care Finnegan Gatta: Claris Gower Other Clinician: Referring Mihir Flanigan: Claris Gower Treating Tereso Unangst/Extender: Jeri Cos Weeks in Treatment: 12 Edema Assessment Assessed: [Left: No] [Right: No] Edema: [Left: Ye] [Right: s] Calf Left: Right: Point of Measurement: 30 cm From Medial Instep 41 cm Ankle Left: Right: Point of Measurement: 10 cm From Medial Instep 26 cm Vascular Assessment Pulses: Dorsalis Pedis Palpable: [Left:Yes] Electronic Signature(s) Signed: 07/10/2021 1:51:47  PM By: Levora Dredge Entered By: Levora Dredge on 07/10/2021 09:12:14 Whetsell, Kayren [name] ([id number]) -------------------------------------------------------------------------------- Multi Wound Chart Details Patient Name: [name] Date of Service: 07/10/2021 9:00 AM Medical Record Number: [id number] Patient Account Number: [id number] Date of Birth/Sex: 28-May-1940 (81 y.o. F) Treating RN: Levora Dredge Primary Care Glyndon Tursi: Claris Gower Other Clinician: Referring Natania Finigan: Claris Gower Treating Haunani Dickard/Extender: Skipper Cliche in Treatment: 12 Vital Signs Height(in): 61 Pulse(bpm): 80 Weight(lbs): 250 Blood Pressure(mmHg): 171/61 Body Mass Index(BMI): 47.2 Temperature(F): 97.7 Respiratory Rate(breaths/min): 18 Photos: [N/A:N/A] Wound Location: Left Toe Great N/A N/A Wounding Event: Gradually Appeared N/A N/A Primary Etiology: Diabetic Wound/Ulcer of the Lower N/A N/A Extremity Comorbid History: Chronic sinus problems/congestion, N/A N/A Anemia, Sleep Apnea, Congestive Heart Failure, Hypertension, Type II Diabetes, Neuropathy Date Acquired: 02/23/2020 N/A N/A Weeks of Treatment: 12 N/A N/A Wound Status: Open N/A N/A Wound Recurrence: No N/A N/A Measurements L x W x D (cm) 0.7x0.6x0.2 N/A N/A Area (cm) : 0.33 N/A N/A Volume (cm) : 0.066 N/A N/A % Reduction in Area: 93.00% N/A N/A % Reduction in Volume: 86.00% N/A N/A Classification: Grade 1 N/A N/A Exudate Amount: Medium N/A N/A Exudate Type: Serosanguineous N/A N/A Exudate Color: red, brown N/A N/A Wound Margin: Thickened N/A N/A Granulation Amount: Medium (34-66%) N/A N/A Granulation Quality: Pink, Pale N/A N/A Necrotic Amount: Medium (34-66%) N/A N/A Exposed Structures: Fat Layer (Subcutaneous Tissue): N/A N/A Yes Epithelialization: None N/A N/A Treatment Notes Electronic Signature(s) Signed: 07/10/2021 1:51:47 PM By: Levora Dredge Entered By: Levora Dredge on 07/10/2021 09:13:54 Mccown,  Kayren [name] ([id number]) -------------------------------------------------------------------------------- Multi-Disciplinary Care Plan Details Patient Name: [name] Date of Service: 07/10/2021 9:00 AM Medical Record Number: [id number] Patient Account Number: [id number] Date of Birth/Sex: 11/20/1940 (81 y.o. F) Treating RN: Levora Dredge Primary Care Nel Stoneking: Claris Gower Other Clinician: Referring Jaki Steptoe: Claris Gower Treating Lorenda Grecco/Extender: Skipper Cliche in Treatment: 12 Active Inactive Wound/Skin Impairment Nursing Diagnoses: Impaired tissue integrity Knowledge deficit related to ulceration/compromised skin integrity Goals: Ulcer/skin breakdown will have a volume reduction of 30% by week 4 Date Initiated: 04/17/2021 Date Inactivated: 07/03/2021 Target Resolution Date: 05/15/2021 Goal Status: Unmet Unmet Reason: comorbities Ulcer/skin breakdown will have a volume reduction of 50% by week 8 Date Initiated: 04/17/2021 Date Inactivated: 07/03/2021  Target Resolution Date: 06/12/2021 Goal Status: Met Ulcer/skin breakdown will have a volume reduction of 80% by week 12 Date Initiated: 04/17/2021 Target Resolution Date: 07/10/2021 Goal Status: Active Ulcer/skin breakdown will heal within 14 weeks Date Initiated: 04/17/2021 Target Resolution Date: 07/24/2021 Goal Status: Active Interventions: Assess patient/caregiver ability to obtain necessary supplies Assess patient/caregiver ability to perform ulcer/skin care regimen upon admission and as needed Assess ulceration(s) every visit Provide education on ulcer and skin care Notes: Electronic Signature(s) Signed: 07/10/2021 1:51:47 PM By: Levora Dredge Entered By: Levora Dredge on 07/10/2021 09:13:48 Blecha, Kayren [name] ([id number]) -------------------------------------------------------------------------------- Pain Assessment Details Patient Name: [name] Date of Service: 07/10/2021 9:00 AM Medical Record  Number: [id number] Patient Account Number: [id number] Date of Birth/Sex: 1940/05/11 (81 y.o. F) Treating RN: Levora Dredge Primary Care Micaiah Litle: Claris Gower Other Clinician: Referring Sigifredo Pignato: Claris Gower Treating Toy Eisemann/Extender: Skipper Cliche in Treatment: 12 Active Problems Location of Pain Severity and Description of Pain Patient Has Paino No Site Locations Rate the pain. Current Pain Level: 0 Pain Management and Medication Current Pain Management: Electronic Signature(s) Signed: 07/10/2021 1:51:47 PM By: Levora Dredge Entered By: Levora Dredge on 07/10/2021 09:06:26 Giller, Kayren [name] ([id number]) -------------------------------------------------------------------------------- Patient/Caregiver Education Details Patient Name: [name] Date of Service: 07/10/2021 9:00 AM Medical Record Number: [id number] Patient Account Number: [id number] Date of Birth/Gender: 1940-09-03 (81 y.o. F) Treating RN: Levora Dredge Primary Care Physician: Claris Gower Other Clinician: Referring Physician: Claris Gower Treating Physician/Extender: Skipper Cliche in Treatment: 12 Education Assessment Education Provided To: Patient Education Topics Provided Wound Debridement: Handouts: Wound Debridement Methods: Explain/Verbal Responses: State content correctly Wound/Skin Impairment: Handouts: Caring for Your Ulcer Methods: Explain/Verbal Responses: State content correctly Electronic Signature(s) Signed: 07/10/2021 1:51:47 PM By: Levora Dredge Entered By: Levora Dredge on 07/10/2021 09:44:30 [place], Kayren [name] ([id number]) -------------------------------------------------------------------------------- Wound Assessment Details Patient Name: [name] Date of Service: 07/10/2021 9:00 AM Medical Record Number: [id number] Patient Account Number: [id number] Date of Birth/Sex: 22-Aug-1940 (81 y.o. F) Treating RN: Levora Dredge Primary Care Kennedy Brines: Claris Gower Other Clinician: Referring Hildegarde Dunaway: Claris Gower Treating Sabreen Kitchen/Extender: Skipper Cliche in Treatment: 12 Wound Status Wound Number: 1 Primary Diabetic Wound/Ulcer of the Lower Extremity Etiology: Wound Location: Left Toe Great Wound Open Wounding Event: Gradually Appeared Status: Date Acquired: 02/23/2020 Comorbid Chronic sinus problems/congestion, Anemia, Sleep Apnea, Weeks Of Treatment: 12 History: Congestive Heart Failure, Hypertension, Type II Diabetes, Clustered Wound: No Neuropathy Photos Wound Measurements Length: (cm) 0.7 Width: (cm) 0.6 Depth: (cm) 0.2 Area: (cm) 0.33 Volume: (cm) 0.066 % Reduction in Area: 93% % Reduction in Volume: 86% Epithelialization: None Tunneling: No Undermining: No Wound Description Classification: Grade 1 Wound Margin: Thickened Exudate Amount: Medium Exudate Type: Serosanguineous Exudate Color: red, brown Foul Odor After Cleansing: No Slough/Fibrino Yes Wound Bed Granulation Amount: Medium (34-66%) Exposed Structure Granulation Quality: Pink, Pale Fat Layer (Subcutaneous Tissue) Exposed: Yes Necrotic Amount: Medium (34-66%) Necrotic Quality: Adherent Slough Treatment Notes Wound #1 (Toe Great) Wound Laterality: Left Cleanser [place] Ancillary Kit - 15 Day Supply Discharge Instruction: Use supplies as instructed; Kit contains: (15) Saline Bullets; (15) 3x3 Gauze; 15 pr Gloves Normal Saline Discharge Instruction: Wash your hands with soap and water. Remove old dressing, discard into plastic bag and place into trash. Cleanse the wound with Normal Saline prior to applying a clean dressing using gauze sponges, not tissues or cotton balls. Do not scrub or use excessive force. Pat dry using gauze sponges, not tissue or cotton balls. Perot, Kayren [name] ([id number]) Peri-Wound Care Topical Primary  Dressing Hydrofera Blue Ready Transfer Foam, 2.5x2.5 (in/in) Discharge Instruction: Apply Hydrofera Blue Ready to wound bed  as directed Secondary Dressing Coverlet Latex-Free Fabric Adhesive Dressings Discharge Instruction: 1.5 x 2 Secured With Compression Wrap Compression Stockings Add-Ons Electronic Signature(s) Signed: 07/10/2021 1:51:47 PM By: Levora Dredge Entered By: Levora Dredge on 07/10/2021 09:13:34 Coiner, Kayren [name] ([id number]) -------------------------------------------------------------------------------- Vitals Details Patient Name: [name] Date of Service: 07/10/2021 9:00 AM Medical Record Number: [id number] Patient Account Number: [id number] Date of Birth/Sex: November 12, 1940 (81 y.o. F) Treating RN: Levora Dredge Primary Care Lille Karim: Claris Gower Other Clinician: Referring Izeyah Deike: Claris Gower Treating Dana Dorner/Extender: Skipper Cliche in Treatment: 12 Vital Signs Time Taken: 09:02 Temperature (F): 97.7 Height (in): 61 Pulse (bpm): 80 Weight (lbs): 250 Respiratory Rate (breaths/min): 18 Body Mass Index (BMI): 47.2 Blood Pressure (mmHg): 171/61 Reference Range: 80 - 120 mg / dl Electronic Signature(s) Signed: 07/10/2021 1:51:47 PM By: Levora Dredge Entered By: Levora Dredge on 07/10/2021 09:06:20

## 2021-07-11 NOTE — Progress Notes (Addendum)
Hufstetler, [name] ([id number]) Visit Report for 07/10/2021 Chief Complaint Document Details Patient Name: Cheryl, [name] Date of Service: 07/10/2021 9:00 AM Medical Record Number: [id number] Patient Account Number: [id number] Date of Birth/Sex: 13-Oct-1940 (81 y.o. F) Treating RN: Levora Dredge Primary Care Provider: Claris Gower Other Clinician: Referring Provider: Claris Gower Treating Provider/Extender: Skipper Cliche in Treatment: 12 Information Obtained from: Patient Chief Complaint Multiple toe ulcersations Electronic Signature(s) Signed: 07/10/2021 9:05:10 AM By: Worthy Keeler PA-C Entered By: Worthy Keeler on 07/10/2021 09:05:10 [name], [name] ([id number]) -------------------------------------------------------------------------------- Debridement Details Patient Name: [name] Date of Service: 07/10/2021 9:00 AM Medical Record Number: [id number] Patient Account Number: [id number] Date of Birth/Sex: Jul 23, 1940 (81 y.o. F) Treating RN: Levora Dredge Primary Care Provider: Claris Gower Other Clinician: Referring Provider: Claris Gower Treating Provider/Extender: Skipper Cliche in Treatment: 12 Debridement Performed for Wound #1 Left Toe Great Assessment: Performed By: Physician Tommie Sams., PA-C Debridement Type: Debridement Severity of Tissue Pre Debridement: Fat layer exposed Level of Consciousness (Pre- Awake and Alert procedure): Pre-procedure Verification/Time Out Yes - 09:33 Taken: Total Area Debrided (L x W): 0.7 (cm) x 0.6 (cm) = 0.42 (cm) Tissue and other material Viable, Non-Viable, Subcutaneous, Biofilm debrided: Level: Skin/Subcutaneous Tissue Debridement Description: Excisional Instrument: Curette Bleeding: Minimum Hemostasis Achieved: Pressure Response to Treatment: Procedure was tolerated well Level of Consciousness (Post- Awake and Alert procedure): Post Debridement Measurements of Total Wound Length: (cm)  0.7 Width: (cm) 0.6 Depth: (cm) 0.2 Volume: (cm) 0.066 Character of Wound/Ulcer Post Debridement: Stable Severity of Tissue Post Debridement: Fat layer exposed Post Procedure Diagnosis Same as Pre-procedure Electronic Signature(s) Signed: 07/10/2021 1:47:08 PM By: Worthy Keeler PA-C Signed: 07/10/2021 1:51:47 PM By: Levora Dredge Entered By: Levora Dredge on 07/10/2021 09:36:12 [name], [name] ([id number]) -------------------------------------------------------------------------------- HPI Details Patient Name: [name] Date of Service: 07/10/2021 9:00 AM Medical Record Number: [id number] Patient Account Number: [id number] Date of Birth/Sex: Dec 05, 1940 (81 y.o. F) Treating RN: Levora Dredge Primary Care Provider: Claris Gower Other Clinician: Referring Provider: Claris Gower Treating Provider/Extender: Skipper Cliche in Treatment: 12 History of Present Illness HPI Description: 04/17/2021 upon evaluation patient appears to be doing somewhat poorly currently in regard to her first and second toe of the left foot. She previously has been seen by Dr. Doran Durand she saw him on 31 January he did not feel there was any evidence of osteomyelitis. He did give her a thorough evaluation including x-rays and showed no abnormal findings according to notes. With that being said he felt like that wound care would be beneficial therefore she contacted [place]. She has currently been using antibiotic ointment and has noted that this wound on the great toe has been there for about a year. She is not certain whether there is anything on the second toe or not she has not really noted any drainage but at the same time she cannot be sure there is nothing hiding up underneath a significant callus here. Her feet really do not have any major complications with regard to pain and that is good news. She does have neuropathy. Patient does have a history of diabetes mellitus type 2, hypertension, and her  most recent hemoglobin A1c was 7.6 on June 2022. 05/08/2021 upon evaluation today patient appears to be doing poorly in regard to her toes unfortunately she has a lot of callus buildup. Its been almost a month since I last saw her. Obviously in this amount of time she has built up quite a bit  of callus she was sick that is the reason she did not make it in. Nonetheless I do think that weekly visits is probably can be the best thing is asking to help to allow these areas to heal much more effectively and quickly. Fortunately I do not see any evidence of active infection locally or systemically which is great news. 05/15/2021 upon evaluation today patient appears to be doing well currently in regard to her wound. Overall I think that we are definitely headed in the right direction with regard to the toes. Fortunately I do not see any signs of active infection locally or systemically at this time which is great news. No fevers, chills, nausea, vomiting, or diarrhea. 05/22/2021 upon evaluation today patient appears to be doing well with regard to her wound. She has been tolerating the dressing changes without complication. Fortunately the second toe is healed the first toe on the left foot is still open but does not appear to be doing nearly as poorly at this time. I do think we can perform a little bit of debridement in order to clear away some of the necrotic debris patient is in agreement with that plan. 05-29-2021 upon evaluation today patient appears to be doing worse currently in regard to the pain experience. Her leg is still very red in fact I think is a little bit worse than last week unfortunately. Last week I was even concerned about a little bit of cellulitis she really felt like it was more related to her swelling but again I am not certain that that is necessarily the case. Fortunately I do not see any signs of active infection at this time systemically though locally I am definitely concerned in  this regard. 06-05-2021 upon evaluation today patient appears to be doing well with regard to her toe ulcer this is worse is also not better though. Unfortunately it is hurting more in her leg in general is also bothering her. This is despite being. She is also tolerating Levaquin with making her dizzy. Nonetheless I am not certain its helping after she has been on this week I am not seeing much of the improvement with regard to the cellulitis in her leg. She is also previously been on doxycycline although that was towards the end of February. 06-12-2021 upon evaluation today patient appears to be doing well with regard to her wound. We are definitely seeing signs of improvement with regard to size. With that being said I do not see any evidence of active infection at this time which is great news and very pleased in that regard. She is going require some debridement both for callus as well as clearing away some of the slough and biofilm. 07-03-2021 upon evaluation today patient appears to be doing well with regard to his wound on the foot. With that being said he unfortunately did have some of the padding along the heel that got pushed down and subsequently he tells me that it was feeling like it was bunched up under his foot and the heel location and bothering him over the past couple of days. With that being said that when we remove the cast today unbeknownst to [facility] the padding had been pushed down and the cast saw actually nicked his heel this looks like a very light abrasion. Nonetheless he does not have any pain and there is no active bleeding at this time. I actually did take a picture to show him as well and we documented it we will  see how this looks obviously next week but I think this will be healed by Wednesday. The wound itself looks to be doing much better there is some callus to remove some slough and biofilm as well. 07-10-2021 upon evaluation today patient appears to be doing well  with regard to her wounds. She has been tolerating the dressing changes without complication. Actually I feel like the foot is looking a little better in regard to the great toe. Everything else is doing better as well. I feel like she has been much more careful with that and from the callus standpoint things are greatly improved it also helps that she shows up on a regular basis. With that being said we are going to be seeing her following her MRI that scheduled for next week. Electronic Signature(s) Signed: 07/10/2021 9:42:33 AM By: Worthy Keeler PA-C Entered By: Worthy Keeler on 07/10/2021 09:42:33 [name], [name] ([id number]) -------------------------------------------------------------------------------- Physical Exam Details Patient Name: [name] Date of Service: 07/10/2021 9:00 AM Medical Record Number: [id number] Patient Account Number: [id number] Date of Birth/Sex: August 11, 1940 (81 y.o. F) Treating RN: Levora Dredge Primary Care Provider: Claris Gower Other Clinician: Referring Provider: Claris Gower Treating Provider/Extender: Skipper Cliche in Treatment: 64 Constitutional Well-nourished and well-hydrated in no acute distress. Respiratory normal breathing without difficulty. Psychiatric this patient is able to make decisions and demonstrates good insight into disease process. Alert and Oriented x 3. pleasant and cooperative. Notes Upon inspection patient's wound bed actually showed signs of good granulation and epithelization at this point. I did not perform some callus debridement as well as clearing away some of the slough and biofilm down to good subcutaneous tissue she tolerated that without complication postdebridement the wound bed appears to be doing much better. Electronic Signature(s) Signed: 07/10/2021 9:42:51 AM By: Worthy Keeler PA-C Entered By: Worthy Keeler on 07/10/2021 09:42:51 Melecio, [name]  ([id number]) -------------------------------------------------------------------------------- Physician Orders Details Patient Name: [name] Date of Service: 07/10/2021 9:00 AM Medical Record Number: [id number] Patient Account Number: [id number] Date of Birth/Sex: [birth year] (81 y.o. 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 o Nurse Visit as needed [employer] o Wash wounds with antibacterial soap and water. o May shower; gently cleanse wound with antibacterial soap, rinse and pat dry prior to dressing wounds o No tub bath. Edema Control - Lymphedema / Segmental Compressive Device / Other o Elevate, Exercise Daily and Avoid Standing for Long Periods of Time. o Elevate legs to the level of the heart and pump ankles as often as possible o Elevate leg(s) parallel to the floor when sitting. o DO YOUR BEST to sleep in the bed at night. DO NOT sleep in your recliner. Long hours of sitting in a recliner leads to swelling of the legs and/or potential wounds on your backside. Wound Treatment Wound #1 - Toe Great Wound Laterality: Left Cleanser: [employer] Ancillary Kit - 15 Day Supply (Generic) Every Other Day/30 Days Discharge Instructions: Use supplies as instructed; Kit contains: (15) Saline Bullets; (15) 3x3 Gauze; 15 pr Gloves Cleanser:  Normal Saline Every Other Day/30 Days Discharge Instructions: Wash your hands with soap and  water. Remove old dressing, discard into plastic bag and place into trash. Cleanse the wound with Normal Saline prior to applying a clean dressing using gauze sponges, not tissues or cotton balls. Do not scrub or use excessive force. Pat dry using gauze sponges, not tissue or cotton balls. Primary Dressing: Hydrofera Blue Ready Transfer Foam, 2.5x2.5 (in/in) (Generic) Every Other Day/30 Days Discharge Instructions: Apply Hydrofera Blue Ready to wound bed as directed Secondary Dressing: Coverlet Latex-Free Fabric Adhesive Dressings Every Other Day/30 Days Discharge Instructions: 1.5 x 2 Electronic Signature(s) Signed: 07/10/2021 1:47:08 PM By: Worthy Keeler PA-C Signed: 07/10/2021 1:51:47 PM By: Levora Dredge Entered By: Levora Dredge on 07/10/2021 09:43:56 [name], [name] ([id number]) -------------------------------------------------------------------------------- Problem List Details Patient Name: [name] Date of Service: 07/10/2021 9:00 AM Medical Record Number: [id number] Patient Account Number: [id number] Date of Birth/Sex: 10/24/1940 (81 y.o. 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 04/17/2021 No Yes Z89.421 Acquired absence of other right toe(s) 04/17/2021 No Yes I10 Essential (primary) hypertension 04/17/2021 No Yes Inactive Problems Resolved Problems Electronic Signature(s) Signed: 07/10/2021 9:05:07 AM By: Worthy Keeler PA-C Entered By: Worthy Keeler on 07/10/2021 09:05:06 [name], [name]  ([id number]) -------------------------------------------------------------------------------- Progress Note Details Patient Name: [name] Date of Service: 07/10/2021 9:00 AM Medical Record Number: [id number] Patient Account Number: [id number] Date of Birth/Sex: September 23, 1940 (81 y.o. F) Treating RN: Levora Dredge Primary Care Provider: Claris Gower Other Clinician: Referring Provider: Claris Gower Treating Provider/Extender: Skipper Cliche in Treatment: 12 Subjective Chief Complaint Information obtained from Patient Multiple toe ulcersations History of Present Illness (HPI) 04/17/2021 upon evaluation patient appears to be doing somewhat poorly currently in regard to her first and second toe of the left foot. She previously has been seen by Dr. Doran Durand she saw him on 31 January he did not feel there was any evidence of osteomyelitis. He did give her a thorough evaluation including x-rays and showed no abnormal findings according to notes. With that being said he felt like that wound care would be beneficial therefore she contacted [place]. She has currently been using antibiotic ointment and has noted that this wound on the great toe has been there for about a year. She is not certain whether there is anything on the second toe or not she has not really noted any drainage but at the same time she cannot be sure there is nothing hiding up underneath a significant callus here. Her feet really do not have any major complications with regard to pain and that is good news. She does have neuropathy. Patient does have a history of diabetes mellitus type 2, hypertension, and her most recent hemoglobin A1c was 7.6 on June 2022. 05/08/2021 upon evaluation today patient appears to be doing poorly in regard to her toes unfortunately she has a lot of callus buildup. Its been almost a month since I last saw her. Obviously in this amount of time she has built up quite a bit of callus she was sick that  is the reason she did not make it in. Nonetheless  I do think that weekly visits is probably can be the best thing is asking to help to allow these areas to heal much more effectively and quickly. Fortunately I do not see any evidence of active infection locally or systemically which is great news. 05/15/2021 upon evaluation today patient appears to be doing well currently in regard to her wound. Overall I think that we are definitely headed in the right direction with regard to the toes. Fortunately I do not see any signs of active infection locally or systemically at this time which is great news. No fevers, chills, nausea, vomiting, or diarrhea. 05/22/2021 upon evaluation today patient appears to be doing well with regard to her wound. She has been tolerating the dressing changes without complication. Fortunately the second toe is healed the first toe on the left foot is still open but does not appear to be doing nearly as poorly at this time. I do think we can perform a little bit of debridement in order to clear away some of the necrotic debris patient is in agreement with that plan. 05-29-2021 upon evaluation today patient appears to be doing worse currently in regard to the pain experience. Her leg is still very red in fact I think is a little bit worse than last week unfortunately. Last week I was even concerned about a little bit of cellulitis she really felt like it was more related to her swelling but again I am not certain that that is necessarily the case. Fortunately I do not see any signs of active infection at this time systemically though locally I am definitely concerned in this regard. 06-05-2021 upon evaluation today patient appears to be doing well with regard to her toe ulcer this is worse is also not better though. Unfortunately it is hurting more in her leg in general is also bothering her. This is despite being. She is also tolerating Levaquin with making her dizzy. Nonetheless I  am not certain its helping after she has been on this week I am not seeing much of the improvement with regard to the cellulitis in her leg. She is also previously been on doxycycline although that was towards the end of February. 06-12-2021 upon evaluation today patient appears to be doing well with regard to her wound. We are definitely seeing signs of improvement with regard to size. With that being said I do not see any evidence of active infection at this time which is great news and very pleased in that regard. She is going require some debridement both for callus as well as clearing away some of the slough and biofilm. 07-03-2021 upon evaluation today patient appears to be doing well with regard to his wound on the foot. With that being said he unfortunately did have some of the padding along the heel that got pushed down and subsequently he tells me that it was feeling like it was bunched up under his foot and the heel location and bothering him over the past couple of days. With that being said that when we remove the cast today unbeknownst to [facility] the padding had been pushed down and the cast saw actually nicked his heel this looks like a very light abrasion. Nonetheless he does not have any pain and there is no active bleeding at this time. I actually did take a picture to show him as well and we documented it we will see how this looks obviously next week but I think this will be healed by Wednesday.  The wound itself looks to be doing much better there is some callus to remove some slough and biofilm as well. 07-10-2021 upon evaluation today patient appears to be doing well with regard to her wounds. She has been tolerating the dressing changes without complication. Actually I feel like the foot is looking a little better in regard to the great toe. Everything else is doing better as well. I feel like she has been much more careful with that and from the callus standpoint things are greatly  improved it also helps that she shows up on a regular basis. With that being said we are going to be seeing her following her MRI that scheduled for next week. [name], [name] ([id number]) Objective Constitutional Well-nourished and well-hydrated in no acute distress. Vitals Time Taken: 9:02 AM, Height: 61 in, Weight: 250 lbs, BMI: 47.2, Temperature: 97.7 F, Pulse: 80 bpm, Respiratory Rate: 18 breaths/min, Blood Pressure: 171/61 mmHg. Respiratory normal breathing without difficulty. Psychiatric this patient is able to make decisions and demonstrates good insight into disease process. Alert and Oriented x 3. pleasant and cooperative. General Notes: Upon inspection patient's wound bed actually showed signs of good granulation and epithelization at this point. I did not perform some callus debridement as well as clearing away some of the slough and biofilm down to good subcutaneous tissue she tolerated that without complication postdebridement the wound bed appears to be doing much better. Integumentary (Hair, Skin) Wound #1 status is Open. Original cause of wound was Gradually Appeared. The date acquired was: 02/23/2020. The wound has been in treatment 12 weeks. The wound is located on the Left Toe Great. The wound measures 0.7cm length x 0.6cm width x 0.2cm depth; 0.33cm^2 area and 0.066cm^3 volume. There is Fat Layer (Subcutaneous Tissue) exposed. There is no tunneling or undermining noted. There is a medium amount of serosanguineous drainage noted. The wound margin is thickened. There is medium (34-66%) pink, pale granulation within the wound bed. There is a medium (34-66%) amount of necrotic tissue within the wound bed including Adherent Slough. 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 There was a Excisional Skin/Subcutaneous Tissue Debridement with a total area of 0.42 sq cm performed by Tommie Sams., PA-C. With the following instrument(s): Curette to remove Viable and Non-Viable tissue/material. Material removed includes Subcutaneous Tissue and Biofilm and. No specimens were taken. A time out was conducted at 09:33, prior to the start of the procedure. A Minimum amount of bleeding was controlled with Pressure. The procedure was tolerated well. Post Debridement Measurements: 0.7cm length x 0.6cm width x 0.2cm depth; 0.066cm^3 volume. Character of Wound/Ulcer Post Debridement is stable. Severity of Tissue Post Debridement is: Fat layer exposed. Post procedure Diagnosis Wound #1: Same as Pre-Procedure Plan 1. I am good recommend that we going continue with the wound care measures as before and the patient is in agreement with the plan. This includes the use of the [facility] which I do believe is doing quite well. 2. Also can recommend that we have the patient continue to monitor for any signs of worsening or infection. Office if anything changes she should let  me know as soon as possible. We will see patient back for reevaluation in 1 week here in the clinic. If anything worsens or changes patient will contact our office for additional recommendations. [name], [name] ([id number]) Electronic Signature(s) Signed: 07/10/2021 9:43:18 AM By: Worthy Keeler PA-C Entered By: Worthy Keeler on 07/10/2021 09:43:18 [name], [name] ([id number]) -------------------------------------------------------------------------------- SuperBill Details Patient Name: [name] Date of Service: 07/10/2021 Medical Record Number: [id number] Patient Account Number:  [id number] Date of Birth/Sex: 06/28/1940 (81 y.o. 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 Z89.421 Acquired absence of other right toe(s) I10 Essential (primary) hypertension Facility Procedures CPT4 Code: [id number] Description: 94090 - DEB SUBQ TISSUE 20 SQ CM/< Modifier: Quantity: 1 CPT4 Code: Description: ICD-10 Diagnosis Description L97.522 Non-pressure chronic ulcer of other part of left foot with fat layer exp Modifier: osed Quantity: Physician Procedures CPT4 Code: [id number] Description: 11042 - WC PHYS SUBQ TISS 20 SQ CM Modifier: Quantity: 1 CPT4 Code: Description: ICD-10 Diagnosis Description L97.522 Non-pressure chronic ulcer of other part of left foot with fat layer exp Modifier: osed Quantity: Electronic Signature(s) Signed: 07/10/2021 9:44:52 AM By: Worthy Keeler PA-C Entered By: Worthy Keeler on 07/10/2021 09:44:52

## 2021-07-12 ENCOUNTER — Ambulatory Visit
Admission: RE | Admit: 2021-07-12 | Discharge: 2021-07-12 | Disposition: A | Discharge status: Home / Self Care | Payer: Medicare Other | Source: Ambulatory Visit | Attending: Physician Assistant | Admitting: Physician Assistant

## 2021-07-12 DIAGNOSIS — E11621 Type 2 diabetes mellitus with foot ulcer: Secondary | ICD-10-CM | POA: Diagnosis not present

## 2021-07-12 DIAGNOSIS — L97509 Non-pressure chronic ulcer of other part of unspecified foot with unspecified severity: Secondary | ICD-10-CM | POA: Diagnosis present

## 2021-07-12 MED ORDER — GADOBUTROL 1 MMOL/ML IV SOLN
10.0000 mL | Freq: Once | INTRAVENOUS | Status: AC | PRN
  Administered 2021-07-12: 10 mL via INTRAVENOUS

## 2021-07-24 ENCOUNTER — Encounter: Payer: Medicare Other | Attending: Physician Assistant | Admitting: Physician Assistant

## 2021-07-24 DIAGNOSIS — E11621 Type 2 diabetes mellitus with foot ulcer: Secondary | ICD-10-CM | POA: Insufficient documentation

## 2021-07-24 DIAGNOSIS — L84 Corns and callosities: Secondary | ICD-10-CM | POA: Diagnosis not present

## 2021-07-24 DIAGNOSIS — E1140 Type 2 diabetes mellitus with diabetic neuropathy, unspecified: Secondary | ICD-10-CM | POA: Diagnosis not present

## 2021-07-24 DIAGNOSIS — Z89421 Acquired absence of other right toe(s): Secondary | ICD-10-CM | POA: Insufficient documentation

## 2021-07-24 DIAGNOSIS — I10 Essential (primary) hypertension: Secondary | ICD-10-CM | POA: Diagnosis not present

## 2021-07-24 DIAGNOSIS — L97522 Non-pressure chronic ulcer of other part of left foot with fat layer exposed: Secondary | ICD-10-CM | POA: Insufficient documentation

## 2021-07-24 DIAGNOSIS — Z89411 Acquired absence of right great toe: Secondary | ICD-10-CM | POA: Insufficient documentation

## 2021-07-24 NOTE — Progress Notes (Addendum)
Bostwick, [name] ([id number]) Visit Report for 07/24/2021 Chief Complaint Document Details Patient Name: [name], [name] Date of Service: 07/24/2021 9:00 AM Medical Record Number: [id number] Patient Account Number: [id number] Date of Birth/Sex: March 05, 1940 (81 y.o. F) Treating RN: Cornell Barman Primary Care Provider: Claris Gower Other Clinician: Referring Provider: Claris Gower Treating Provider/Extender: Skipper Cliche in Treatment: 14 Information Obtained from: Patient Chief Complaint Multiple toe ulcersations Electronic Signature(s) Signed: 07/24/2021 9:06:49 AM By: Worthy Keeler PA-C Entered By: Worthy Keeler on 07/24/2021 09:06:49 Blash, [name] ([id number]) -------------------------------------------------------------------------------- Debridement Details Patient Name: [name] Date of Service: 07/24/2021 9:00 AM Medical Record Number: [id number] Patient Account Number: [id number] Date of Birth/Sex: 1940-11-19 (81 y.o. F) Treating RN: Alycia Rossetti Primary Care Provider: Claris Gower Other Clinician: Referring Provider: Claris Gower Treating Provider/Extender: Skipper Cliche in Treatment: 14 Debridement Performed for Wound #1 Left Toe Great Assessment: Performed By: Physician Tommie Sams., PA-C Debridement Type: Debridement Severity of Tissue Pre Debridement: Fat layer exposed Level of Consciousness (Pre- Awake and Alert procedure): Pre-procedure Verification/Time Out Yes - 09:20 Taken: Start Time: 09:20 Pain Control: Lidocaine 4% Topical Solution Total Area Debrided (L x W): 0.4 (cm) x 0.5 (cm) = 0.2 (cm) Tissue and other material Viable, Non-Viable, Callus, Slough, Subcutaneous, Slough debrided: Level: Skin/Subcutaneous Tissue Debridement Description: Excisional Instrument: Curette Bleeding: Minimum Hemostasis Achieved: Pressure Procedural Pain: 0 Post Procedural Pain: 0 Response to Treatment: Procedure was tolerated well Level of  Consciousness (Post- Awake and Alert procedure): Post Debridement Measurements of Total Wound Length: (cm) 0.5 Width: (cm) 1 Depth: (cm) 0.3 Volume: (cm) 0.118 Character of Wound/Ulcer Post Debridement: Improved Severity of Tissue Post Debridement: Fat layer exposed Post Procedure Diagnosis Same as Pre-procedure Electronic Signature(s) Signed: 07/24/2021 4:28:01 PM By: Alycia Rossetti Signed: 07/24/2021 4:36:57 PM By: Worthy Keeler PA-C Entered By: Alycia Rossetti on 07/24/2021 09:27:20 Schubring, [name] ([id number]) -------------------------------------------------------------------------------- HPI Details Patient Name: [name] Date of Service: 07/24/2021 9:00 AM Medical Record Number: [id number] Patient Account Number: [id number] Date of Birth/Sex: 10-Feb-1941 (81 y.o. F) Treating RN: Cornell Barman Primary Care Provider: Claris Gower Other Clinician: Referring Provider: Claris Gower Treating Provider/Extender: Skipper Cliche in Treatment: 14 History of Present Illness HPI Description: 04/17/2021 upon evaluation patient appears to be doing somewhat poorly currently in regard to her first and second toe of the left foot. She previously has been seen by Dr. Doran Durand she saw him on 31 January he did not feel there was any evidence of osteomyelitis. He did give her a thorough evaluation including x-rays and showed no abnormal findings according to notes. With that being said he felt like that wound care would be beneficial therefore she contacted [place]. She has currently been using antibiotic ointment and has noted that this wound on the great toe has been there for about a year. She is not certain whether there is anything on the second toe or not she has not really noted any drainage but at the same time she cannot be sure there is nothing hiding up underneath a significant callus here. Her feet really do not have any major complications with regard to pain and that is good news. She does  have neuropathy. Patient does have a history of diabetes mellitus type 2, hypertension, and her most recent hemoglobin A1c was 7.6 on June 2022. 05/08/2021 upon evaluation today patient appears to be doing poorly in regard to her toes unfortunately she has a lot of callus buildup. Its been almost a month  since I last saw her. Obviously in this amount of time she has built up quite a bit of callus she was sick that is the reason she did not make it in. Nonetheless I do think that weekly visits is probably can be the best thing is asking to help to allow these areas to heal much more effectively and quickly. Fortunately I do not see any evidence of active infection locally or systemically which is great news. 05/15/2021 upon evaluation today patient appears to be doing well currently in regard to her wound. Overall I think that we are definitely headed in the right direction with regard to the toes. Fortunately I do not see any signs of active infection locally or systemically at this time which is great news. No fevers, chills, nausea, vomiting, or diarrhea. 05/22/2021 upon evaluation today patient appears to be doing well with regard to her wound. She has been tolerating the dressing changes without complication. Fortunately the second toe is healed the first toe on the left foot is still open but does not appear to be doing nearly as poorly at this time. I do think we can perform a little bit of debridement in order to clear away some of the necrotic debris patient is in agreement with that plan. 05-29-2021 upon evaluation today patient appears to be doing worse currently in regard to the pain experience. Her leg is still very red in fact I think is a little bit worse than last week unfortunately. Last week I was even concerned about a little bit of cellulitis she really felt like it was more related to her swelling but again I am not certain that that is necessarily the case. Fortunately I do not see any  signs of active infection at this time systemically though locally I am definitely concerned in this regard. 06-05-2021 upon evaluation today patient appears to be doing well with regard to her toe ulcer this is worse is also not better though. Unfortunately it is hurting more in her leg in general is also bothering her. This is despite being. She is also tolerating Levaquin with making her dizzy. Nonetheless I am not certain its helping after she has been on this week I am not seeing much of the improvement with regard to the cellulitis in her leg. She is also previously been on doxycycline although that was towards the end of February. 06-12-2021 upon evaluation today patient appears to be doing well with regard to her wound. We are definitely seeing signs of improvement with regard to size. With that being said I do not see any evidence of active infection at this time which is great news and very pleased in that regard. She is going require some debridement both for callus as well as clearing away some of the slough and biofilm. 07-03-2021 upon evaluation today patient appears to be doing well with regard to his wound on the foot. With that being said he unfortunately did have some of the padding along the heel that got pushed down and subsequently he tells me that it was feeling like it was bunched up under his foot and the heel location and bothering him over the past couple of days. With that being said that when we remove the cast today unbeknownst to [facility] the padding had been pushed down and the cast saw actually nicked his heel this looks like a very light abrasion. Nonetheless he does not have any pain and there is no active bleeding at this  time. I actually did take a picture to show him as well and we documented it we will see how this looks obviously next week but I think this will be healed by Wednesday. The wound itself looks to be doing much better there is some callus to remove some  slough and biofilm as well. 07-10-2021 upon evaluation today patient appears to be doing well with regard to her wounds. She has been tolerating the dressing changes without complication. Actually I feel like the foot is looking a little better in regard to the great toe. Everything else is doing better as well. I feel like she has been much more careful with that and from the callus standpoint things are greatly improved it also helps that she shows up on a regular basis. With that being said we are going to be seeing her following her MRI that scheduled for next week. 07-24-2021 upon evaluation today patient appears to be doing well with regard to her wound. She actually is shown signs of improvement there are still some callus not nearly as bad as what it is then I think we need to try to see what we can do to get some of this off today but hopefully will continue to show signs of improvement with the current regimen. She has thought about the hyperbarics she tells me there is just really not can be a way for her to get here regularly for that. Electronic Signature(s) Signed: 07/24/2021 9:26:42 AM By: Worthy Keeler PA-C Entered By: Worthy Keeler on 07/24/2021 09:26:42 Mansel, [name] ([id number]) -------------------------------------------------------------------------------- Physical Exam Details Patient Name: [name] Date of Service: 07/24/2021 9:00 AM Medical Record Number: [id number] Patient Account Number: [id number] Date of Birth/Sex: Feb 05, 1941 (81 y.o. F) Treating RN: Cornell Barman Primary Care Provider: Claris Gower Other Clinician: Referring Provider: Claris Gower Treating Provider/Extender: Skipper Cliche in Treatment: 14 Constitutional Obese and well-hydrated in no acute distress. Respiratory normal breathing without difficulty. Psychiatric this patient is able to make decisions and demonstrates good insight into disease process. Alert and Oriented x 3. pleasant  and cooperative. Notes Upon inspection patient's wound bed actually showed signs of good granulation epithelization there was a lot of callus that needed to be debrided away I did perform debridement today to clear away necrotic tissue including callus and necrotic tissue down to the good subcutaneous level she tolerated this without pain or complication. Electronic Signature(s) Signed: 07/24/2021 9:27:03 AM By: Worthy Keeler PA-C Entered By: Worthy Keeler on 07/24/2021 09:27:03 Gemma, [name] ([id number]) -------------------------------------------------------------------------------- Physician Orders Details Patient Name: [name] Date of Service: 07/24/2021 9:00 AM Medical Record Number: [id number] Patient Account Number: [id number] Date of Birth/Sex: 1940/09/14 (81 y.o. 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 o Nurse Visit as needed [employer] o Wash wounds with antibacterial soap and water. o May shower; gently cleanse wound with antibacterial soap, rinse and pat dry prior to dressing wounds o No tub bath. Edema Control - Lymphedema / Segmental Compressive Device / Other o Elevate, Exercise Daily  and Avoid Standing for Long Periods of Time. o Elevate legs to the level of the heart and pump ankles as often as possible o Elevate leg(s) parallel to the floor when sitting. o DO YOUR BEST to sleep in the bed at  night. DO NOT sleep in your recliner. Long hours of sitting in a recliner leads to swelling of the legs and/or potential wounds on your backside. Wound Treatment Wound #1 - Toe Great Wound Laterality: Left Cleanser: [place] Ancillary Kit - 15 Day Supply (Generic) Every Other Day/30 Days Discharge Instructions: Use supplies as instructed; Kit contains: (15) Saline Bullets; (15) 3x3 Gauze; 15 pr Gloves Cleanser: Normal Saline Every Other Day/30 Days Discharge Instructions: Wash your hands with soap and water. Remove old dressing, discard into plastic bag and place into trash. Cleanse the wound with Normal Saline prior to applying a clean dressing using gauze sponges, not tissues or cotton balls. Do not scrub or use excessive force. Pat dry using gauze sponges, not tissue or cotton balls. Primary Dressing: Hydrofera Blue Ready Transfer Foam, 2.5x2.5 (in/in) (Generic) Every Other Day/30 Days Discharge Instructions: Apply Hydrofera Blue Ready to wound bed as directed Secondary Dressing: Coverlet Latex-Free Fabric Adhesive Dressings Every Other Day/30 Days Discharge Instructions: 1.5 x 2 Electronic Signature(s) Signed: 07/24/2021 4:28:01 PM By: Alycia Rossetti Signed: 07/24/2021 4:36:57 PM By: Worthy Keeler PA-C Entered By: Alycia Rossetti on 07/24/2021 09:28:05 Medlin, [name] ([id number]) -------------------------------------------------------------------------------- Problem List Details Patient Name: [name] Date of Service: 07/24/2021 9:00 AM Medical Record Number: [id number] Patient Account Number: [id number] Date of Birth/Sex: November 18, 1940 (81 y.o. 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 04/17/2021 No Yes Z89.421 Acquired absence of other right toe(s) 04/17/2021 No Yes I10 Essential (primary) hypertension 04/17/2021 No Yes Inactive Problems Resolved Problems Electronic Signature(s) Signed: 07/24/2021 4:28:01 PM By: Alycia Rossetti Signed: 07/24/2021 4:36:57 PM By: Worthy Keeler PA-C Previous Signature: 07/24/2021 9:06:42 AM Version By: Worthy Keeler PA-C Entered By: Alycia Rossetti on 07/24/2021 09:28:39 Balducci, [name] ([id number]) -------------------------------------------------------------------------------- Progress Note Details Patient Name: [name] Date of Service: 07/24/2021 9:00 AM Medical Record Number: [id number] Patient Account Number: [id number] Date of Birth/Sex: 05-Oct-1940 (81 y.o. F) Treating RN: Cornell Barman Primary Care Provider: Claris Gower Other Clinician: Referring Provider: Claris Gower Treating Provider/Extender: Skipper Cliche in Treatment: 14 Subjective Chief Complaint Information obtained from Patient Multiple toe ulcersations History of Present Illness (HPI) 04/17/2021 upon evaluation patient appears to be doing somewhat poorly currently in regard to her first and second toe of the left foot. She previously has been seen by Dr. Doran Durand she saw him on 31 January he did not feel there was any evidence of osteomyelitis. He did give her a thorough evaluation including x-rays and showed no abnormal findings according to notes. With that being said he felt like that wound care would be beneficial therefore she contacted [place]. She has currently been using antibiotic ointment and has noted that this wound on the great toe has been there for about a year. She is not certain whether there is anything on the second toe or not she has not really noted any  drainage but at the same time she cannot be sure there is nothing hiding up  underneath a significant callus here. Her feet really do not have any major complications with regard to pain and that is good news. She does have neuropathy. Patient does have a history of diabetes mellitus type 2, hypertension, and her most recent hemoglobin A1c was 7.6 on June 2022. 05/08/2021 upon evaluation today patient appears to be doing poorly in regard to her toes unfortunately she has a lot of callus buildup. Its been almost a month since I last saw her. Obviously in this amount of time she has built up quite a bit of callus she was sick that is the reason she did not make it in. Nonetheless I do think that weekly visits is probably can be the best thing is asking to help to allow these areas to heal much more effectively and quickly. Fortunately I do not see any evidence of active infection locally or systemically which is great news. 05/15/2021 upon evaluation today patient appears to be doing well currently in regard to her wound. Overall I think that we are definitely headed in the right direction with regard to the toes. Fortunately I do not see any signs of active infection locally or systemically at this time which is great news. No fevers, chills, nausea, vomiting, or diarrhea. 05/22/2021 upon evaluation today patient appears to be doing well with regard to her wound. She has been tolerating the dressing changes without complication. Fortunately the second toe is healed the first toe on the left foot is still open but does not appear to be doing nearly as poorly at this time. I do think we can perform a little bit of debridement in order to clear away some of the necrotic debris patient is in agreement with that plan. 05-29-2021 upon evaluation today patient appears to be doing worse currently in regard to the pain experience. Her leg is still very red in fact I think is a little bit worse than last week unfortunately. Last week I was even concerned about a little bit of cellulitis she  really felt like it was more related to her swelling but again I am not certain that that is necessarily the case. Fortunately I do not see any signs of active infection at this time systemically though locally I am definitely concerned in this regard. 06-05-2021 upon evaluation today patient appears to be doing well with regard to her toe ulcer this is worse is also not better though. Unfortunately it is hurting more in her leg in general is also bothering her. This is despite being. She is also tolerating Levaquin with making her dizzy. Nonetheless I am not certain its helping after she has been on this week I am not seeing much of the improvement with regard to the cellulitis in her leg. She is also previously been on doxycycline although that was towards the end of February. 06-12-2021 upon evaluation today patient appears to be doing well with regard to her wound. We are definitely seeing signs of improvement with regard to size. With that being said I do not see any evidence of active infection at this time which is great news and very pleased in that regard. She is going require some debridement both for callus as well as clearing away some of the slough and biofilm. 07-03-2021 upon evaluation today patient appears to be doing well with regard to his wound on the foot. With that being said he unfortunately  did have some of the padding along the heel that got pushed down and subsequently he tells me that it was feeling like it was bunched up under his foot and the heel location and bothering him over the past couple of days. With that being said that when we remove the cast today unbeknownst to [facility] the padding had been pushed down and the cast saw actually nicked his heel this looks like a very light abrasion. Nonetheless he does not have any pain and there is no active bleeding at this time. I actually did take a picture to show him as well and we documented it we will see how this looks  obviously next week but I think this will be healed by Wednesday. The wound itself looks to be doing much better there is some callus to remove some slough and biofilm as well. 07-10-2021 upon evaluation today patient appears to be doing well with regard to her wounds. She has been tolerating the dressing changes without complication. Actually I feel like the foot is looking a little better in regard to the great toe. Everything else is doing better as well. I feel like she has been much more careful with that and from the callus standpoint things are greatly improved it also helps that she shows up on a regular basis. With that being said we are going to be seeing her following her MRI that scheduled for next week. 07-24-2021 upon evaluation today patient appears to be doing well with regard to her wound. She actually is shown signs of improvement there are still some callus not nearly as bad as what it is then I think we need to try to see what we can do to get some of this off today but hopefully will continue to show signs of improvement with the current regimen. She has thought about the hyperbarics she tells me there is just really not can be a way for her to get here regularly for that. Mcelhannon, [name] ([id number]) Objective Constitutional Obese and well-hydrated in no acute distress. Vitals Time Taken: 9:00 AM, Height: 61 in, Weight: 250 lbs, BMI: 47.2, Temperature: 98.9 F, Pulse: 80 bpm, Respiratory Rate: 18 breaths/min, Blood Pressure: 133/45 mmHg. Respiratory normal breathing without difficulty. Psychiatric this patient is able to make decisions and demonstrates good insight into disease process. Alert and Oriented x 3. pleasant and cooperative. General Notes: Upon inspection patient's wound bed actually showed signs of good granulation epithelization there was a lot of callus that needed to be debrided away I did perform debridement today to clear away necrotic tissue including callus  and necrotic tissue down to the good subcutaneous level she tolerated this without pain or complication. Integumentary (Hair, Skin) Wound #1 status is Open. Original cause of wound was Gradually Appeared. The date acquired was: 02/23/2020. The wound has been in treatment 14 weeks. The wound is located on the Left Toe Great. The wound measures 0.4cm length x 0.5cm width x 0.1cm depth; 0.157cm^2 area and 0.016cm^3 volume. There is Fat Layer (Subcutaneous Tissue) exposed. There is a medium amount of serosanguineous drainage noted. The wound margin is thickened. There is medium (34-66%) pink, pale granulation within the wound bed. There is a medium (34-66%) amount of necrotic tissue within the wound bed including Adherent Slough. 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 I would recommend currently that we go and continue with the wound care measures as before and the patient is in agreement with plan this includes the use of the [facility] which I think is still doing a great job. 2. Most likely recommend that we have the patient continue to monitor for any signs of worsening or infection. if anything changes she should let me know. 3. I am also going to suggest that we have the patient use her diabetic shoes. She does have them at home I think she needs to be using those on a regular basis. We will see patient back for reevaluation in 1 week here in the clinic. If anything worsens or changes patient will contact our office for additional recommendations. Electronic Signature(s) Signed: 07/24/2021 9:28:00 AM By: Worthy Keeler PA-C Entered By: Worthy Keeler on 07/24/2021 09:28:00 Contee, [name]  ([id number]) -------------------------------------------------------------------------------- SuperBill Details Patient Name: [name] Date of Service: 07/24/2021 Medical Record Number: [id number] Patient Account Number: [id number] Date of Birth/Sex: 1940-12-31 (81 y.o. 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 Z89.421 Acquired absence of other right toe(s) I10 Essential (primary) hypertension Facility Procedures CPT4 Code: 74163845 Description: 36468 - DEB SUBQ TISSUE 20 SQ CM/< Modifier: Quantity: 1 CPT4 Code: Description: ICD-10 Diagnosis Description 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

## 2021-07-24 NOTE — Progress Notes (Signed)
Rounsaville, [name] ([id number]) Visit Report for 07/24/2021 Arrival Information Details Patient Name: [name], [name] Date of Service: 07/24/2021 9:00 AM Medical Record Number: [id number] Patient Account Number: [id number] Date of Birth/Sex: 1940/05/29 (81 y.o. F) Treating RN: Alycia Rossetti Primary Care Carless Slatten: Claris Gower Other Clinician: Referring Frantz Quattrone: Claris Gower Treating Kerrion Kemppainen/Extender: Skipper Cliche in Treatment: 14 Visit Information History Since Last Visit Any new allergies or adverse reactions: No Patient Arrived: [name] Had a fall or experienced change in No Arrival Time: 09:00 activities of daily living that may affect Transfer Assistance: None risk of falls: Patient Requires Transmission-Based Precautions: No Hospitalized since last visit: No Patient Has Alerts: No Has Dressing in Place as Prescribed: Yes Pain Present Now: No Electronic Signature(s) Signed: 07/24/2021 4:28:01 PM By: Alycia Rossetti Entered By: Alycia Rossetti on 07/24/2021 09:01:41 Copus, [name] ([id number]) -------------------------------------------------------------------------------- Clinic Level of Care Assessment Details Patient Name: [name] Date of Service: 07/24/2021 9:00 AM Medical Record Number: [id number] Patient Account Number: [id number] Date of Birth/Sex: August 10, 1940 (81 y.o. F) Treating RN: Alycia Rossetti Primary Care Lanaiya Lantry: Claris Gower Other Clinician: Referring Sherl Yzaguirre: Claris Gower Treating Jewelz Kobus/Extender: Skipper Cliche in Treatment: 14 Clinic Level of Care Assessment Items TOOL 1 Quantity Score '[]'  - Use when EandM and Procedure is performed on INITIAL visit 0 ASSESSMENTS - Nursing Assessment / Reassessment '[]'  - General Physical Exam (combine w/ comprehensive assessment (listed just below) when performed on new 0 pt. evals) '[]'  - 0 Comprehensive Assessment (HX, ROS, Risk Assessments, Wounds Hx, etc.) ASSESSMENTS - Wound and Skin Assessment /  Reassessment '[]'  - Dermatologic / Skin Assessment (not related to wound area) 0 ASSESSMENTS - Ostomy and/or Continence Assessment and Care '[]'  - Incontinence Assessment and Management 0 '[]'  - 0 Ostomy Care Assessment and Management (repouching, etc.) PROCESS - Coordination of Care '[]'  - Simple Patient / Family Education for ongoing care 0 '[]'  - 0 Complex (extensive) Patient / Family Education for ongoing care '[]'  - 0 Staff obtains Programmer, systems, Records, Test Results / Process Orders '[]'  - 0 Staff telephones HHA, Nursing Homes / Clarify orders / etc '[]'  - 0 Routine Transfer to another Facility (non-emergent condition) '[]'  - 0 Routine Hospital Admission (non-emergent condition) '[]'  - 0 New Admissions / Biomedical engineer / Ordering NPWT, Apligraf, etc. '[]'  - 0 Emergency Hospital Admission (emergent condition) PROCESS - Special Needs '[]'  - Pediatric / Minor Patient Management 0 '[]'  - 0 Isolation Patient Management '[]'  - 0 Hearing / Language / Visual special needs '[]'  - 0 Assessment of Community assistance (transportation, D/C planning, etc.) '[]'  - 0 Additional assistance / Altered mentation '[]'  - 0 Support Surface(s) Assessment (bed, cushion, seat, etc.) INTERVENTIONS - Miscellaneous '[]'  - External ear exam 0 '[]'  - 0 Patient Transfer (multiple staff / Civil Service fast streamer / Similar devices) '[]'  - 0 Simple Staple / Suture removal (25 or less) '[]'  - 0 Complex Staple / Suture removal (26 or more) '[]'  - 0 Hypo/Hyperglycemic Management (do not check if billed separately) '[]'  - 0 Ankle / Brachial Index (ABI) - do not check if billed separately Has the patient been seen at the hospital within the last three years: Yes Total Score: 0 Level Of Care: ____ [name] ([id number]) Electronic Signature(s) Signed: 07/24/2021 4:28:01 PM By: Alycia Rossetti Entered By: Alycia Rossetti on 07/24/2021 09:28:15 Grismer, [name]  ([id number]) -------------------------------------------------------------------------------- Encounter Discharge Information Details Patient Name: [name] Date of Service: 07/24/2021 9:00 AM Medical Record Number: [id number] Patient Account Number: [id number] Date of Birth/Sex: May 16, 1940 (  81 y.o. F) Treating RN: Alycia Rossetti Primary Care Ajanay Farve: Claris Gower Other Clinician: Referring Koralynn Greenspan: Claris Gower Treating Dion Parrow/Extender: Skipper Cliche in Treatment: 14 Encounter Discharge Information Items Post Procedure Vitals Discharge Condition: Stable Temperature (F): 98.9 Ambulatory Status: Cane Pulse (bpm): 80 Discharge Destination: Home Respiratory Rate (breaths/min): 18 Transportation: Private Auto Blood Pressure (mmHg): 133/45 Schedule Follow-up Appointment: Yes Clinical Summary of Care: Electronic Signature(s) Signed: 07/24/2021 4:28:01 PM By: Alycia Rossetti Entered By: Alycia Rossetti on 07/24/2021 09:38:07 Flanigan, [name] ([id number]) -------------------------------------------------------------------------------- Lower Extremity Assessment Details Patient Name: [name] Date of Service: 07/24/2021 9:00 AM Medical Record Number: [id number] Patient Account Number: [id number] Date of Birth/Sex: 1940-04-20 (81 y.o. F) Treating RN: Alycia Rossetti Primary Care Zelphia Glover: Claris Gower Other Clinician: Referring Latoria Dry: Claris Gower Treating Belky Mundo/Extender: Skipper Cliche in Treatment: 14 Edema Assessment Assessed: [Left: No] Patrice Paradise: No] Edema: [Left: Ye] [Right: s] Calf Left: Right: Point of Measurement: 30 cm From Medial Instep 39 cm Ankle Left: Right: Point of Measurement: 10 cm From Medial Instep 26.5 cm Vascular Assessment Pulses: Dorsalis Pedis Palpable: [Left:Yes] Electronic Signature(s) Signed: 07/24/2021 4:28:01 PM By: Alycia Rossetti Entered By: Alycia Rossetti on 07/24/2021 09:16:07 Taitt, [name]  ([id number]) -------------------------------------------------------------------------------- Multi Wound Chart Details Patient Name: [name] Date of Service: 07/24/2021 9:00 AM Medical Record Number: [id number] Patient Account Number: [id number] Date of Birth/Sex: May 27, 1940 (81 y.o. F) Treating RN: Alycia Rossetti Primary Care Icess Bertoni: Claris Gower Other Clinician: Referring Cleburne Savini: Claris Gower Treating Areyana Leoni/Extender: Skipper Cliche in Treatment: 14 Vital Signs Height(in): 61 Pulse(bpm): 80 Weight(lbs): 250 Blood Pressure(mmHg): 133/45 Body Mass Index(BMI): 47.2 Temperature(F): 98.9 Respiratory Rate(breaths/min): 18 Photos: [N/A:N/A] Wound Location: Left Toe Great N/A N/A Wounding Event: Gradually Appeared N/A N/A Primary Etiology: Diabetic Wound/Ulcer of the Lower N/A N/A Extremity Comorbid History: Chronic sinus problems/congestion, N/A N/A Anemia, Sleep Apnea, Congestive Heart Failure, Hypertension, Type II Diabetes, Neuropathy Date Acquired: 02/23/2020 N/A N/A Weeks of Treatment: 14 N/A N/A Wound Status: Open N/A N/A Wound Recurrence: No N/A N/A Measurements L x W x D (cm) 0.4x0.5x0.1 N/A N/A Area (cm) : 0.157 N/A N/A Volume (cm) : 0.016 N/A N/A % Reduction in Area: 96.70% N/A N/A % Reduction in Volume: 96.60% N/A N/A Classification: Grade 1 N/A N/A Exudate Amount: Medium N/A N/A Exudate Type: Serosanguineous N/A N/A Exudate Color: red, brown N/A N/A Wound Margin: Thickened N/A N/A Granulation Amount: Medium (34-66%) N/A N/A Granulation Quality: Pink, Pale N/A N/A Necrotic Amount: Medium (34-66%) N/A N/A Exposed Structures: Fat Layer (Subcutaneous Tissue): N/A N/A Yes Epithelialization: None N/A N/A Treatment Notes Electronic Signature(s) Signed: 07/24/2021 4:28:01 PM By: Alycia Rossetti Entered By: Alycia Rossetti on 07/24/2021 09:16:21 Jacuinde, [name]  ([id number]) -------------------------------------------------------------------------------- Multi-Disciplinary Care Plan Details Patient Name: [name] Date of Service: 07/24/2021 9:00 AM Medical Record Number: [id number] Patient Account Number: [id number] Date of Birth/Sex: [birth year] (81 y.o. F) Treating RN: Alycia Rossetti Primary Care Alverda Nazzaro: Claris Gower Other Clinician: Referring Kimbria Camposano: Claris Gower Treating Liesel Peckenpaugh/Extender: Skipper Cliche in Treatment: 14 Active Inactive Wound/Skin Impairment Nursing Diagnoses: Impaired tissue integrity Knowledge deficit related to ulceration/compromised skin integrity Goals: Ulcer/skin breakdown will have a volume reduction of 30% by week 4 Date Initiated: 04/17/2021 Date Inactivated: 07/03/2021 Target Resolution Date: 05/15/2021 Goal Status: Unmet Unmet Reason: comorbities Ulcer/skin breakdown will have a volume reduction of 50% by week 8 Date Initiated: 04/17/2021 Date Inactivated: 07/03/2021 Target Resolution Date: 06/12/2021 Goal Status: Met Ulcer/skin breakdown will have a volume reduction of 80% by week 12 Date Initiated: 04/17/2021  Target Resolution Date: 07/10/2021 Goal Status: Active Ulcer/skin breakdown will heal within 14 weeks Date Initiated: 04/17/2021 Target Resolution Date: 07/24/2021 Goal Status: Active Interventions: Assess patient/caregiver ability to obtain necessary supplies Assess patient/caregiver ability to perform ulcer/skin care regimen upon admission and as needed Assess ulceration(s) every visit Provide education on ulcer and skin care Notes: Electronic Signature(s) Signed: 07/24/2021 4:28:01 PM By: Alycia Rossetti Entered By: Alycia Rossetti on 07/24/2021 09:16:13 Seliga, [name] ([id number]) -------------------------------------------------------------------------------- Pain Assessment Details Patient Name: [name] Date of Service: 07/24/2021 9:00 AM Medical Record Number: [id number] Patient  Account Number: [id number] Date of Birth/Sex: 22-Feb-1941 (81 y.o. F) Treating RN: Alycia Rossetti Primary Care Ladelle Teodoro: Claris Gower Other Clinician: Referring Averi Kilty: Claris Gower Treating Cherl Gorney/Extender: Skipper Cliche in Treatment: 14 Active Problems Location of Pain Severity and Description of Pain Patient Has Paino No Site Locations Pain Management and Medication Current Pain Management: Electronic Signature(s) Signed: 07/24/2021 4:28:01 PM By: Alycia Rossetti Entered By: Alycia Rossetti on 07/24/2021 09:03:00 Caine, [name] ([id number]) -------------------------------------------------------------------------------- Patient/Caregiver Education Details Patient Name: [name] Date of Service: 07/24/2021 9:00 AM Medical Record Number: [id number] Patient Account Number: [id number] Date of Birth/Gender: 1940-09-16 (81 y.o. F) Treating RN: Alycia Rossetti Primary Care Physician: Claris Gower Other Clinician: Referring Physician: Claris Gower Treating Physician/Extender: Skipper Cliche in Treatment: 14 Education Assessment Education Provided To: Patient Education Topics Provided Wound/Skin Impairment: Methods: Demonstration, Explain/Verbal Responses: State content correctly Electronic Signature(s) Signed: 07/24/2021 4:28:01 PM By: Alycia Rossetti Entered By: Alycia Rossetti on 07/24/2021 09:28:33 Cooley, [name] ([id number]) -------------------------------------------------------------------------------- Wound Assessment Details Patient Name: [name] Date of Service: 07/24/2021 9:00 AM Medical Record Number: [id number] Patient Account Number: [id number] Date of Birth/Sex: 09-25-1940 (81 y.o. F) Treating RN: Alycia Rossetti Primary Care Harvie [name]: Claris Gower Other Clinician: Referring Amaura Authier: Claris Gower Treating Vernette Moise/Extender: Skipper Cliche in Treatment: 14 Wound Status Wound Number: 1 Primary Diabetic Wound/Ulcer of the Lower  Extremity Etiology: Wound Location: Left Toe Great Wound Open Wounding Event: Gradually Appeared Status: Date Acquired: 02/23/2020 Comorbid Chronic sinus problems/congestion, Anemia, Sleep Apnea, Weeks Of Treatment: 14 History: Congestive Heart Failure, Hypertension, Type II Diabetes, Clustered Wound: No Neuropathy Photos Wound Measurements Length: (cm) 0.4 Width: (cm) 0.5 Depth: (cm) 0.1 Area: (cm) 0.157 Volume: (cm) 0.016 % Reduction in Area: 96.7% % Reduction in Volume: 96.6% Epithelialization: None Wound Description Classification: Grade 1 Wound Margin: Thickened Exudate Amount: Medium Exudate Type: Serosanguineous Exudate Color: red, brown Foul Odor After Cleansing: No Slough/Fibrino Yes Wound Bed Granulation Amount: Medium (34-66%) Exposed Structure Granulation Quality: Pink, Pale Fat Layer (Subcutaneous Tissue) Exposed: Yes Necrotic Amount: Medium (34-66%) Necrotic Quality: Adherent Slough Treatment Notes Wound #1 (Toe Great) Wound Laterality: Left Cleanser [place] Ancillary Kit - 15 Day Supply Discharge Instruction: Use supplies as instructed; Kit contains: (15) Saline Bullets; (15) 3x3 Gauze; 15 pr Gloves Normal Saline Discharge Instruction: Wash your hands with soap and water. Remove old dressing, discard into plastic bag and place into trash. Cleanse the wound with Normal Saline prior to applying a clean dressing using gauze sponges, not tissues or cotton balls. Do not scrub or use excessive force. Pat dry using gauze sponges, not tissue or cotton balls. Sharma, [name] ([id number]) Peri-Wound Care Topical Primary Dressing Hydrofera Blue Ready Transfer Foam, 2.5x2.5 (in/in) Discharge Instruction: Apply Hydrofera Blue Ready to wound bed as directed Secondary Dressing Coverlet Latex-Free Fabric Adhesive Dressings Discharge Instruction: 1.5 x 2 Secured With Compression Wrap Compression Stockings Add-Ons Electronic Signature(s) Signed: 07/24/2021 4:28:01  PM By: Alycia Rossetti  Entered By: Alycia Rossetti on 07/24/2021 09:14:10 Bignell, [name] ([id number]) -------------------------------------------------------------------------------- Vitals Details Patient Name: [name] Date of Service: 07/24/2021 9:00 AM Medical Record Number: [id number] Patient Account Number: [id number] Date of Birth/Sex: 1940/05/21 (81 y.o. F) Treating RN: Alycia Rossetti Primary Care Melba Araki: Claris Gower Other Clinician: Referring Daryon Remmert: Claris Gower Treating Domenick Quebedeaux/Extender: Skipper Cliche in Treatment: 14 Vital Signs Time Taken: 09:00 Temperature (F): 98.9 Height (in): 61 Pulse (bpm): 80 Weight (lbs): 250 Respiratory Rate (breaths/min): 18 Body Mass Index (BMI): 47.2 Blood Pressure (mmHg): 133/45 Reference Range: 80 - 120 mg / dl Electronic Signature(s) Signed: 07/24/2021 4:28:01 PM By: Alycia Rossetti Entered By: Alycia Rossetti on 07/24/2021 09:02:51

## 2021-07-31 ENCOUNTER — Encounter: Payer: Medicare Other | Admitting: Physician Assistant

## 2021-07-31 DIAGNOSIS — E11621 Type 2 diabetes mellitus with foot ulcer: Secondary | ICD-10-CM | POA: Diagnosis not present

## 2021-07-31 NOTE — Progress Notes (Signed)
[name] ([id number]) Visit Report for 07/31/2021 Arrival Information Details Patient Name: [name], [name] Date of Service: 07/31/2021 8:30 AM Medical Record Number: [id number] Patient Account Number: [id number] Date of Birth/Sex: 12-Jan-1941 (81 y.o. F) Treating RN: Levora Dredge Primary Care Aleczander Fandino: Claris Gower Other Clinician: Referring Joanmarie Tsang: Claris Gower Treating Janice Bodine/Extender: Skipper Cliche in Treatment: 15 Visit Information History Since Last Visit Added or deleted any medications: No Patient Arrived: [name] Any new allergies or adverse reactions: No Arrival Time: 08:48 Had a fall or experienced change in No Accompanied By: self activities of daily living that may affect Transfer Assistance: None risk of falls: Patient Identification Verified: Yes Hospitalized since last visit: No Secondary Verification Process Completed: Yes Has Dressing in Place as Prescribed: Yes Patient Requires Transmission-Based Precautions: No Pain Present Now: Yes Patient Has Alerts: No Electronic Signature(s) Signed: 07/31/2021 4:15:12 PM By: Levora Dredge Entered By: Levora Dredge on 07/31/2021 08:48:39 Ambrosini, [name] ([id number]) -------------------------------------------------------------------------------- Clinic Level of Care Assessment Details Patient Name: [name] Date of Service: 07/31/2021 8:30 AM Medical Record Number: [id number] Patient Account Number: [id number] Date of Birth/Sex: [birth year] (81 y.o. F) Treating RN: Levora Dredge Primary Care Nimesh Riolo: Claris Gower Other Clinician: Referring Carley Glendenning: Claris Gower Treating Laney Bagshaw/Extender: Skipper Cliche in Treatment: 15 Clinic Level of Care Assessment Items TOOL 1 Quantity Score '[]'  - Use when EandM and Procedure is performed on INITIAL visit 0 ASSESSMENTS - Nursing Assessment / Reassessment '[]'  - General Physical Exam (combine w/ comprehensive assessment (listed just below) when  performed on new 0 pt. evals) '[]'  - 0 Comprehensive Assessment (HX, ROS, Risk Assessments, Wounds Hx, etc.) ASSESSMENTS - Wound and Skin Assessment / Reassessment '[]'  - Dermatologic / Skin Assessment (not related to wound area) 0 ASSESSMENTS - Ostomy and/or Continence Assessment and Care '[]'  - Incontinence Assessment and Management 0 '[]'  - 0 Ostomy Care Assessment and Management (repouching, etc.) PROCESS - Coordination of Care '[]'  - Simple Patient / Family Education for ongoing care 0 '[]'  - 0 Complex (extensive) Patient / Family Education for ongoing care '[]'  - 0 Staff obtains Programmer, systems, Records, Test Results / Process Orders '[]'  - 0 Staff telephones HHA, Nursing Homes / Clarify orders / etc '[]'  - 0 Routine Transfer to another Facility (non-emergent condition) '[]'  - 0 Routine Hospital Admission (non-emergent condition) '[]'  - 0 New Admissions / Biomedical engineer / Ordering NPWT, Apligraf, etc. '[]'  - 0 Emergency Hospital Admission (emergent condition) PROCESS - Special Needs '[]'  - Pediatric / Minor Patient Management 0 '[]'  - 0 Isolation Patient Management '[]'  - 0 Hearing / Language / Visual special needs '[]'  - 0 Assessment of Community assistance (transportation, D/C planning, etc.) '[]'  - 0 Additional assistance / Altered mentation '[]'  - 0 Support Surface(s) Assessment (bed, cushion, seat, etc.) INTERVENTIONS - Miscellaneous '[]'  - External ear exam 0 '[]'  - 0 Patient Transfer (multiple staff / Civil Service fast streamer / Similar devices) '[]'  - 0 Simple Staple / Suture removal (25 or less) '[]'  - 0 Complex Staple / Suture removal (26 or more) '[]'  - 0 Hypo/Hyperglycemic Management (do not check if billed separately) '[]'  - 0 Ankle / Brachial Index (ABI) - do not check if billed separately Has the patient been seen at the hospital within the last three years: Yes Total Score: 0 Level Of Care: ____ [name] ([id number]) Electronic Signature(s) Signed: 07/31/2021 4:15:12 PM By: Levora Dredge Entered By: Levora Dredge on 07/31/2021 09:14:05 Stadler, [name] ([id number]) -------------------------------------------------------------------------------- Encounter Discharge Information Details Patient Name: [name]  Date of Service: 07/31/2021 8:30 AM Medical Record Number: [id number] Patient Account Number: [id number] Date of Birth/Sex: Jul 05, 1940 (81 y.o. F) Treating RN: Levora Dredge Primary Care Angelise Petrich: Claris Gower Other Clinician: Referring Willie Loy: Claris Gower Treating Akiko Schexnider/Extender: Skipper Cliche in Treatment: 15 Encounter Discharge Information Items Post Procedure Vitals Discharge Condition: Stable Temperature (F): 98.2 Ambulatory Status: Cane Pulse (bpm): 79 Discharge Destination: Home Respiratory Rate (breaths/min): 18 Transportation: Private Auto Blood Pressure (mmHg): 153/55 Accompanied By: self Schedule Follow-up Appointment: Yes Clinical Summary of Care: Electronic Signature(s) Signed: 07/31/2021 4:15:12 PM By: Levora Dredge Entered By: Levora Dredge on 07/31/2021 09:15:32 Royster, [name] ([id number]) -------------------------------------------------------------------------------- Lower Extremity Assessment Details Patient Name: [name] Date of Service: 07/31/2021 8:30 AM Medical Record Number: [id number] Patient Account Number: [id number] Date of Birth/Sex: [birth year] (81 y.o. F) Treating RN: Levora Dredge Primary Care Rogan Ecklund: Claris Gower Other Clinician: Referring Clarkson Rosselli: Claris Gower Treating Westyn Driggers/Extender: Jeri Cos Weeks in Treatment: 15 Edema Assessment Assessed: [Left: No] [Right: No] Edema: [Left: Ye] [Right: s] Calf Left: Right: Point of Measurement: 30 cm From Medial Instep 39.8 cm Ankle Left: Right: Point of Measurement: 10 cm From Medial Instep 26.4 cm Vascular Assessment Pulses: Dorsalis Pedis Palpable: [Left:Yes] Electronic Signature(s) Signed: 07/31/2021 4:15:12 PM By:  Levora Dredge Entered By: Levora Dredge on 07/31/2021 08:56:07 Tourigny, [name] ([id number]) -------------------------------------------------------------------------------- Multi Wound Chart Details Patient Name: [name] Date of Service: 07/31/2021 8:30 AM Medical Record Number: [id number] Patient Account Number: [id number] Date of Birth/Sex: 09-Sep-1940 (81 y.o. F) Treating RN: Levora Dredge Primary Care Shahin Knierim: Claris Gower Other Clinician: Referring Keshan Reha: Claris Gower Treating Marina Boerner/Extender: Skipper Cliche in Treatment: 15 Vital Signs Height(in): 61 Pulse(bpm): 76 Weight(lbs): 250 Blood Pressure(mmHg): 153/55 Body Mass Index(BMI): 47.2 Temperature(F): 98.2 Respiratory Rate(breaths/min): 18 Photos: [N/A:N/A] Wound Location: Left Toe Great N/A N/A Wounding Event: Gradually Appeared N/A N/A Primary Etiology: Diabetic Wound/Ulcer of the Lower N/A N/A Extremity Comorbid History: Chronic sinus problems/congestion, N/A N/A Anemia, Sleep Apnea, Congestive Heart Failure, Hypertension, Type II Diabetes, Neuropathy Date Acquired: 02/23/2020 N/A N/A Weeks of Treatment: 15 N/A N/A Wound Status: Open N/A N/A Wound Recurrence: No N/A N/A Measurements L x W x D (cm) 0.5x0.7x0.1 N/A N/A Area (cm) : 0.275 N/A N/A Volume (cm) : 0.027 N/A N/A % Reduction in Area: 94.20% N/A N/A % Reduction in Volume: 94.30% N/A N/A Classification: Grade 1 N/A N/A Exudate Amount: Medium N/A N/A Exudate Type: Serosanguineous N/A N/A Exudate Color: red, brown N/A N/A Wound Margin: Thickened N/A N/A Granulation Amount: Medium (34-66%) N/A N/A Granulation Quality: Pink N/A N/A Necrotic Amount: Medium (34-66%) N/A N/A Exposed Structures: Fat Layer (Subcutaneous Tissue): N/A N/A Yes Epithelialization: None N/A N/A Treatment Notes Electronic Signature(s) Signed: 07/31/2021 4:15:12 PM By: Levora Dredge Entered By: Levora Dredge on 07/31/2021 09:07:04 Dunigan, [name]  ([id number]) -------------------------------------------------------------------------------- Multi-Disciplinary Care Plan Details Patient Name: [name] Date of Service: 07/31/2021 8:30 AM Medical Record Number: [id number] Patient Account Number: [id number] Date of Birth/Sex: 04-13-1940 (81 y.o. F) Treating RN: Levora Dredge Primary Care Jany Buckwalter: Claris Gower Other Clinician: Referring Amandeep Nesmith: Claris Gower Treating Palmina Clodfelter/Extender: Skipper Cliche in Treatment: 15 Active Inactive Wound/Skin Impairment Nursing Diagnoses: Impaired tissue integrity Knowledge deficit related to ulceration/compromised skin integrity Goals: Ulcer/skin breakdown will have a volume reduction of 30% by week 4 Date Initiated: 04/17/2021 Date Inactivated: 07/03/2021 Target Resolution Date: 05/15/2021 Goal Status: Unmet Unmet Reason: comorbities Ulcer/skin breakdown will have a volume reduction of 50% by week 8 Date Initiated: 04/17/2021 Date Inactivated: 07/03/2021 Target Resolution  Date: 06/12/2021 Goal Status: Met Ulcer/skin breakdown will have a volume reduction of 80% by week 12 Date Initiated: 04/17/2021 Target Resolution Date: 07/10/2021 Goal Status: Active Ulcer/skin breakdown will heal within 14 weeks Date Initiated: 04/17/2021 Target Resolution Date: 07/24/2021 Goal Status: Active Interventions: Assess patient/caregiver ability to obtain necessary supplies Assess patient/caregiver ability to perform ulcer/skin care regimen upon admission and as needed Assess ulceration(s) every visit Provide education on ulcer and skin care Notes: Electronic Signature(s) Signed: 07/31/2021 4:15:12 PM By: Levora Dredge Entered By: Levora Dredge on 07/31/2021 09:06:56 Muramoto, [name] ([id number]) -------------------------------------------------------------------------------- Pain Assessment Details Patient Name: [name] Date of Service: 07/31/2021 8:30 AM Medical Record Number:  [id number] Patient Account Number: [id number] Date of Birth/Sex: October 11, 1940 (81 y.o. F) Treating RN: Levora Dredge Primary Care Derrien Anschutz: Claris Gower Other Clinician: Referring Etrulia Zarr: Claris Gower Treating Jailen Lung/Extender: Skipper Cliche in Treatment: 15 Active Problems Location of Pain Severity and Description of Pain Patient Has Paino Yes Site Locations Pain Management and Medication Current Pain Management: Notes pt states soreness in knees Electronic Signature(s) Signed: 07/31/2021 4:15:12 PM By: Levora Dredge Entered By: Levora Dredge on 07/31/2021 08:49:16 Letson, [name] ([id number]) -------------------------------------------------------------------------------- Patient/Caregiver Education Details Patient Name: [name] Date of Service: 07/31/2021 8:30 AM Medical Record Number: [id number] Patient Account Number: [id number] Date of Birth/Gender: 01/13/1941 (81 y.o. F) Treating RN: Levora Dredge Primary Care Physician: Claris Gower Other Clinician: Referring Physician: Claris Gower Treating Physician/Extender: Skipper Cliche in Treatment: 15 Education Assessment Education Provided To: Patient Education Topics Provided Wound Debridement: Handouts: Wound Debridement Methods: Explain/Verbal Responses: State content correctly Wound/Skin Impairment: Handouts: Caring for Your Ulcer Methods: Explain/Verbal Responses: State content correctly Electronic Signature(s) Signed: 07/31/2021 4:15:12 PM By: Levora Dredge Entered By: Levora Dredge on 07/31/2021 09:14:34 Fess, [name] ([id number]) -------------------------------------------------------------------------------- Wound Assessment Details Patient Name: [name] Date of Service: 07/31/2021 8:30 AM Medical Record Number: [id number] Patient Account Number: [id number] Date of Birth/Sex: April 02, 1940 (81 y.o. F) Treating RN: Levora Dredge Primary Care Avani Sensabaugh: Claris Gower Other  Clinician: Referring Demaris Bousquet: Claris Gower Treating Lewis Grivas/Extender: Skipper Cliche in Treatment: 15 Wound Status Wound Number: 1 Primary Diabetic Wound/Ulcer of the Lower Extremity Etiology: Wound Location: Left Toe Great Wound Open Wounding Event: Gradually Appeared Status: Date Acquired: 02/23/2020 Comorbid Chronic sinus problems/congestion, Anemia, Sleep Apnea, Weeks Of Treatment: 15 History: Congestive Heart Failure, Hypertension, Type II Diabetes, Clustered Wound: No Neuropathy Photos Wound Measurements Length: (cm) 0.5 Width: (cm) 0.7 Depth: (cm) 0.1 Area: (cm) 0.275 Volume: (cm) 0.027 % Reduction in Area: 94.2% % Reduction in Volume: 94.3% Epithelialization: None Tunneling: No Undermining: No Wound Description Classification: Grade 1 Wound Margin: Thickened Exudate Amount: Medium Exudate Type: Serosanguineous Exudate Color: red, brown Foul Odor After Cleansing: No Slough/Fibrino Yes Wound Bed Granulation Amount: Medium (34-66%) Exposed Structure Granulation Quality: Pink Fat Layer (Subcutaneous Tissue) Exposed: Yes Necrotic Amount: Medium (34-66%) Necrotic Quality: Adherent Slough Treatment Notes Wound #1 (Toe Great) Wound Laterality: Left Cleanser Byram Ancillary Kit - 15 Day Supply Discharge Instruction: Use supplies as instructed; Kit contains: (15) Saline Bullets; (15) 3x3 Gauze; 15 pr Gloves Normal Saline Discharge Instruction: Wash your hands with soap and water. Remove old dressing, discard into plastic bag and place into trash. Cleanse the wound with Normal Saline prior to applying a clean dressing using gauze sponges, not tissues or cotton balls. Do not scrub or use excessive force. Pat dry using gauze sponges, not tissue or cotton balls. Stieber, [name] ([id number]) Peri-Wound Care Topical Primary Dressing Hydrofera Blue Ready  Transfer Foam, 2.5x2.5 (in/in) Discharge Instruction: Apply Hydrofera Blue Ready to wound bed as  directed Secondary Dressing Coverlet Latex-Free Fabric Adhesive Dressings Discharge Instruction: 1.5 x 2 Secured With Compression Wrap Compression Stockings Add-Ons Gauze Non-Bordered 4x4 (in/in) Discharge Instruction: Cover with dry gauze, rolled up and placed under left great toe Electronic Signature(s) Signed: 07/31/2021 4:15:12 PM By: Levora Dredge Entered By: Levora Dredge on 07/31/2021 08:55:47 Wojcicki, [name] ([id number]) -------------------------------------------------------------------------------- Vitals Details Patient Name: [name] Date of Service: 07/31/2021 8:30 AM Medical Record Number: [id number] Patient Account Number: [id number] Date of Birth/Sex: 1940-03-22 (81 y.o. F) Treating RN: Levora Dredge Primary Care Sarath Privott: Claris Gower Other Clinician: Referring Julieann Drummonds: Claris Gower Treating Zakeria Kulzer/Extender: Skipper Cliche in Treatment: 15 Vital Signs Time Taken: 08:48 Temperature (F): 98.2 Height (in): 61 Pulse (bpm): 79 Weight (lbs): 250 Respiratory Rate (breaths/min): 18 Body Mass Index (BMI): 47.2 Blood Pressure (mmHg): 153/55 Reference Range: 80 - 120 mg / dl Electronic Signature(s) Signed: 07/31/2021 4:15:12 PM By: Levora Dredge Entered By: Levora Dredge on 07/31/2021 08:48:55

## 2021-07-31 NOTE — Progress Notes (Signed)
Anzalone, [name] ([id number]) Visit Report for 07/31/2021 Chief Complaint Document Details Patient Name: BRIENA, SWINGLER Date of Service: 07/31/2021 8:30 AM Medical Record Number: [id number] Patient Account Number: [id number] Date of Birth/Sex: Jan 28, 1941 (81 y.o. F) Treating RN: Levora Dredge Primary Care Provider: Claris Gower Other Clinician: Referring Provider: Claris Gower Treating Provider/Extender: Skipper Cliche in Treatment: 15 Information Obtained from: Patient Chief Complaint Multiple toe ulcersations Electronic Signature(s) Signed: 07/31/2021 9:06:17 AM By: Worthy Keeler PA-C Entered By: Worthy Keeler on 07/31/2021 09:06:17 Peter, [name] ([id number]) -------------------------------------------------------------------------------- Debridement Details Patient Name: [name] Date of Service: 07/31/2021 8:30 AM Medical Record Number: [id number] Patient Account Number: [id number] Date of Birth/Sex: [birth year] (81 y.o. F) Treating RN: Levora Dredge Primary Care Provider: Claris Gower Other Clinician: Referring Provider: Claris Gower Treating Provider/Extender: Skipper Cliche in Treatment: 15 Debridement Performed for Wound #1 Left Toe Great Assessment: Performed By: Physician Tommie Sams., PA-C Debridement Type: Debridement Severity of Tissue Pre Debridement: Fat layer exposed Level of Consciousness (Pre- Awake and Alert procedure): Pre-procedure Verification/Time Out Yes - 09:11 Taken: Total Area Debrided (L x W): 0.5 (cm) x 0.7 (cm) = 0.35 (cm) Tissue and other material Viable, Non-Viable, Callus, Slough, Subcutaneous, Slough debrided: Level: Skin/Subcutaneous Tissue Debridement Description: Excisional Instrument: Curette Bleeding: Minimum Hemostasis Achieved: Pressure Response to Treatment: Procedure was tolerated well Level of Consciousness (Post- Awake and Alert procedure): Post Debridement Measurements of Total Wound Length:  (cm) 0.5 Width: (cm) 0.7 Depth: (cm) 0.1 Volume: (cm) 0.027 Character of Wound/Ulcer Post Debridement: Stable Severity of Tissue Post Debridement: Fat layer exposed Post Procedure Diagnosis Same as Pre-procedure Electronic Signature(s) Unsigned Entered By: Levora Dredge on 07/31/2021 09:12:01 Signature(s): Date(s): Rathke, [name] ([id number]) -------------------------------------------------------------------------------- Physician Orders Details Patient Name: [name] Date of Service: 07/31/2021 8:30 AM Medical Record Number: [id number] Patient Account Number: [id number] Date of Birth/Sex: August 02, 1940 (81 y.o. 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 o Nurse Visit as needed [employer] o Wash wounds with antibacterial soap and water. o May shower; gently cleanse wound with antibacterial soap, rinse and pat dry prior to dressing wounds o No tub bath. Edema Control - Lymphedema / Segmental Compressive Device / Other o Elevate, Exercise Daily and Avoid Standing for Long Periods of Time. o Elevate legs to the level of the heart and pump ankles as often as possible o Elevate leg(s) parallel to the floor when sitting. o DO YOUR BEST to sleep in the bed at night. DO NOT sleep in your recliner. Long hours of sitting in a recliner leads to swelling of the legs and/or potential wounds on your backside. Wound  Treatment Wound #1 - Toe Great Wound Laterality: Left Cleanser: Byram Ancillary Kit - 15 Day Supply (Generic) Every Other Day/30 Days Discharge Instructions: Use supplies as instructed; Kit contains: (15) Saline Bullets; (15) 3x3 Gauze; 15 pr Gloves Cleanser: Normal Saline Every Other Day/30 Days Discharge Instructions: Wash your hands with soap and water. Remove old dressing, discard into plastic bag and place into trash. Cleanse the wound with Normal  Saline prior to applying a clean dressing using gauze sponges, not tissues or cotton balls. Do not scrub or use excessive force. Pat dry using gauze sponges, not tissue or cotton balls. Primary Dressing: Hydrofera Blue Ready Transfer Foam, 2.5x2.5 (in/in) (Generic) Every Other Day/30 Days Discharge Instructions: Apply Hydrofera Blue Ready to wound bed as directed Secondary Dressing: Coverlet Latex-Free Fabric Adhesive Dressings Every Other Day/30 Days Discharge Instructions: 1.5 x 2 Add-Ons: Gauze Non-Bordered 4x4 (in/in) Every Other Day/30 Days Discharge Instructions: Cover with dry gauze, rolled up and placed under left great toe Electronic Signature(s) Unsigned Entered By: Levora Dredge on 07/31/2021 09:13:25 Signature(s): Date(s): [place], [name] ([id number]) -------------------------------------------------------------------------------- Problem List Details Patient Name: [name] Date of Service: 07/31/2021 8:30 AM Medical Record Number: [id number] Patient Account Number: [id number] Date of Birth/Sex: Jul 04, 1940 (81 y.o. 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

## 2021-08-07 ENCOUNTER — Encounter: Payer: Medicare Other | Admitting: Physician Assistant

## 2021-08-14 ENCOUNTER — Encounter: Payer: Medicare Other | Admitting: Physician Assistant

## 2021-08-14 DIAGNOSIS — E11621 Type 2 diabetes mellitus with foot ulcer: Secondary | ICD-10-CM | POA: Diagnosis not present

## 2021-08-21 ENCOUNTER — Encounter: Payer: Medicare Other | Admitting: Physician Assistant

## 2021-08-21 DIAGNOSIS — E11621 Type 2 diabetes mellitus with foot ulcer: Secondary | ICD-10-CM | POA: Diagnosis not present

## 2021-08-21 NOTE — Progress Notes (Addendum)
Novello, [name] ([id number]) Visit Report for 08/21/2021 Chief Complaint Document Details Patient Name: [name] Date of Service: 08/21/2021 9:00 AM Medical Record Number: [id number] Patient Account Number: [id number] Date of Birth/Sex: [birth year] (81 y.o. F) Treating RN: Alycia Rossetti Primary Care Provider: Claris Gower Other Clinician: Referring Provider: Claris Gower Treating Provider/Extender: Skipper Cliche in Treatment: 18 Information Obtained from: Patient Chief Complaint Multiple toe ulcersations Electronic Signature(s) Signed: 08/21/2021 9:20:04 AM By: Worthy Keeler PA-C Entered By: Worthy Keeler on 08/21/2021 09:20:04 [name], [name] ([id number]) -------------------------------------------------------------------------------- Debridement Details Patient Name: [name] Date of Service: 08/21/2021 9:00 AM Medical Record Number: [id number] Patient Account Number: [id number] Date of Birth/Sex: 01-Nov-1940 (81 y.o. F) Treating RN: Alycia Rossetti Primary Care Provider: Claris Gower Other Clinician: Referring Provider: Claris Gower Treating Provider/Extender: Skipper Cliche in Treatment: 18 Debridement Performed for Wound #1 Left Toe Great Assessment: Performed By: Physician Tommie Sams., PA-C Debridement Type: Debridement Severity of Tissue Pre Debridement: Fat layer exposed Level of Consciousness (Pre- Awake and Alert procedure): Pre-procedure Verification/Time Out Yes - 10:00 Taken: Start Time: 10:00 Pain Control: Lidocaine 4% Topical Solution Total Area Debrided (L x W): 0.2 (cm) x 0.4 (cm) = 0.08 (cm) Tissue and other material Viable, Non-Viable, Callus, Slough, Subcutaneous, Slough debrided: Level: Skin/Subcutaneous Tissue Debridement Description: Excisional Instrument: Curette Bleeding: Minimum Hemostasis Achieved: Pressure Procedural Pain: 0 Post Procedural Pain: 0 Response to Treatment: Procedure was tolerated well Level of  Consciousness (Post- Awake and Alert procedure): Post Debridement Measurements of Total Wound Length: (cm) 0.3 Width: (cm) 0.9 Depth: (cm) 0.2 Volume: (cm) 0.042 Character of Wound/Ulcer Post Debridement: Stable Severity of Tissue Post Debridement: Fat layer exposed Post Procedure Diagnosis Same as Pre-procedure Electronic Signature(s) Signed: 08/21/2021 4:01:56 PM By: Alycia Rossetti Signed: 08/21/2021 4:59:17 PM By: Worthy Keeler PA-C Entered By: Alycia Rossetti on 08/21/2021 10:04:59 [name], [name] ([id number]) -------------------------------------------------------------------------------- HPI Details Patient Name: [name] Date of Service: 08/21/2021 9:00 AM Medical Record Number: [id number] Patient Account Number: [id number] Date of Birth/Sex: 28-Oct-1940 (81 y.o. F) Treating RN: Alycia Rossetti Primary Care Provider: Claris Gower Other Clinician: Referring Provider: Claris Gower Treating Provider/Extender: Skipper Cliche in Treatment: 18 History of Present Illness HPI Description: 04/17/2021 upon evaluation patient appears to be doing somewhat poorly currently in regard to her first and second toe of the left foot. She previously has been seen by Dr. Doran Durand she saw him on 31 January he did not feel there was any evidence of osteomyelitis. He did give her a thorough evaluation including x-rays and showed no abnormal findings according to notes. With that being said he felt like that wound care would be beneficial therefore she contacted [place]. She has currently been using antibiotic ointment and has noted that this wound on the great toe has been there for about a year. She is not certain whether there is anything on the second toe or not she has not really noted any drainage but at the same time she cannot be sure there is nothing hiding up underneath a significant callus here. Her feet really do not have any major complications with regard to pain and that is good news. She  does have neuropathy. Patient does have a history of diabetes mellitus type 2, hypertension, and her most recent hemoglobin A1c was 7.6 on June 2022. 05/08/2021 upon evaluation today patient appears to be doing poorly in regard to her toes unfortunately she has a lot of callus buildup. Its been almost a month  since I last saw her. Obviously in this amount of time she has built up quite a bit of callus she was sick that is the reason she did not make it in. Nonetheless I do think that weekly visits is probably can be the best thing is asking to help to allow these areas to heal much more effectively and quickly. Fortunately I do not see any evidence of active infection locally or systemically which is great news. 05/15/2021 upon evaluation today patient appears to be doing well currently in regard to her wound. Overall I think that we are definitely headed in the right direction with regard to the toes. Fortunately I do not see any signs of active infection locally or systemically at this time which is great news. No fevers, chills, nausea, vomiting, or diarrhea. 05/22/2021 upon evaluation today patient appears to be doing well with regard to her wound. She has been tolerating the dressing changes without complication. Fortunately the second toe is healed the first toe on the left foot is still open but does not appear to be doing nearly as poorly at this time. I do think we can perform a little bit of debridement in order to clear away some of the necrotic debris patient is in agreement with that plan. 05-29-2021 upon evaluation today patient appears to be doing worse currently in regard to the pain experience. Her leg is still very red in fact I think is a little bit worse than last week unfortunately. Last week I was even concerned about a little bit of cellulitis she really felt like it was more related to her swelling but again I am not certain that that is necessarily the case. Fortunately I do not  see any signs of active infection at this time systemically though locally I am definitely concerned in this regard. 06-05-2021 upon evaluation today patient appears to be doing well with regard to her toe ulcer this is worse is also not better though. Unfortunately it is hurting more in her leg in general is also bothering her. This is despite being. She is also tolerating Levaquin with making her dizzy. Nonetheless I am not certain its helping after she has been on this week I am not seeing much of the improvement with regard to the cellulitis in her leg. She is also previously been on doxycycline although that was towards the end of February. 06-12-2021 upon evaluation today patient appears to be doing well with regard to her wound. We are definitely seeing signs of improvement with regard to size. With that being said I do not see any evidence of active infection at this time which is great news and very pleased in that regard. She is going require some debridement both for callus as well as clearing away some of the slough and biofilm. 07-03-2021 upon evaluation today patient appears to be doing well with regard to his wound on the foot. With that being said he unfortunately did have some of the padding along the heel that got pushed down and subsequently he tells me that it was feeling like it was bunched up under his foot and the heel location and bothering him over the past couple of days. With that being said that when we remove the cast today unbeknownst to [facility] the padding had been pushed down and the cast saw actually nicked his heel this looks like a very light abrasion. Nonetheless he does not have any pain and there is no active bleeding at this  time. I actually did take a picture to show him as well and we documented it we will see how this looks obviously next week but I think this will be healed by Wednesday. The wound itself looks to be doing much better there is some callus to  remove some slough and biofilm as well. 07-10-2021 upon evaluation today patient appears to be doing well with regard to her wounds. She has been tolerating the dressing changes without complication. Actually I feel like the foot is looking a little better in regard to the great toe. Everything else is doing better as well. I feel like she has been much more careful with that and from the callus standpoint things are greatly improved it also helps that she shows up on a regular basis. With that being said we are going to be seeing her following her MRI that scheduled for next week. 07-24-2021 upon evaluation today patient appears to be doing well with regard to her wound. She actually is shown signs of improvement there are still some callus not nearly as bad as what it is then I think we need to try to see what we can do to get some of this off today but hopefully will continue to show signs of improvement with the current regimen. She has thought about the hyperbarics she tells me there is just really not can be a way for her to get here regularly for that. 07-31-2021 upon evaluation today patient appears to be doing well with regard to her toe ulcer although its not significantly smaller its not worse either. She still has a lot of callus buildup. With that being said I think we need to do something to try to keep pressure off of the toe. I think a bolster underneath the big toe could be beneficial in this regard. 08-14-2021 upon evaluation today patient's wound actually showed signs of some improvement she still had callus but for 2 weeks this was not bad at all compared to what we normally find. I do think we are on the right track here and the good news is she seems to be making some excellent progress in my opinion. 08-21-2021 upon evaluation today patient appears to be doing well with regard to her toe ulcer. Unfortunately her silicone toe spacer was too tight Binsfeld, Ainara A. ([id number]) for her  toe by the time she got home and was already causing her pain. For that reason she has not been wearing that she is putting the rolled up gauze up underneath. Obviously I think this is still a good way to go and better than nothing the biggest thing is that she still is walking without shoes at home I discussed this with her previously but again I have that discussion again today I want her to have shoes on at all times she has been going around just in her "sock feet". Electronic Signature(s) Signed: 08/21/2021 3:08:03 PM By: Worthy Keeler PA-C Entered By: Worthy Keeler on 08/21/2021 15:08:03 [name], [name] ([id number]) -------------------------------------------------------------------------------- Physical Exam Details Patient Name: [name] Date of Service: 08/21/2021 9:00 AM Medical Record Number: [id number] Patient Account Number: [id number] Date of Birth/Sex: 1940-03-22 (81 y.o. F) Treating RN: Alycia Rossetti Primary Care Provider: Claris Gower Other Clinician: Referring Provider: Claris Gower Treating Provider/Extender: Skipper Cliche in Treatment: 37 Constitutional Well-nourished and well-hydrated in no acute distress. Respiratory normal breathing without difficulty. Psychiatric this patient is able to make decisions and demonstrates good insight into disease  process. Alert and Oriented x 3. pleasant and cooperative. Notes Upon inspection patient's wound bed actually showed signs of good granulation and epithelization at this point. Fortunately I do not see any signs of infection locally or systemically which is great news. No fevers, chills, nausea, vomiting, or diarrhea. Electronic Signature(s) Signed: 08/21/2021 3:08:21 PM By: Worthy Keeler PA-C Entered By: Worthy Keeler on 08/21/2021 15:08:21 [name], [name] ([id number]) -------------------------------------------------------------------------------- Physician Orders Details Patient Name: [name] Date of Service: 08/21/2021 9:00 AM Medical Record Number: [id number] Patient Account Number: [id number] Date of Birth/Sex: May 12, 1940 (81 y.o. 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 o Nurse Visit as needed [employer] o Wash wounds with antibacterial soap and water. o May shower; gently cleanse wound with antibacterial soap, rinse and pat dry prior to dressing wounds o No tub bath. Edema Control - Lymphedema / Segmental Compressive Device / Other o Elevate, Exercise Daily and Avoid Standing for Long Periods of Time. o Elevate legs to the level of the heart and pump ankles as often as possible o Elevate leg(s) parallel to the floor when sitting. o DO YOUR BEST to sleep in the bed at night. DO NOT sleep in your recliner. Long hours of sitting in a recliner leads to swelling of the legs and/or potential wounds on your backside. Wound Treatment Wound #1 - Toe Great Wound Laterality: Left Cleanser: [place] Ancillary Kit - 15 Day Supply (Generic) Every Other Day/30 Days Discharge Instructions: Use supplies as instructed; Kit contains: (15) Saline Bullets; (15) 3x3 Gauze; 15 pr Gloves Cleanser: Normal Saline Every Other Day/30 Days Discharge Instructions: Wash your hands with soap and water. Remove old dressing, discard into plastic bag and place into trash. Cleanse the wound with Normal Saline prior to applying a clean dressing  using gauze sponges, not tissues or cotton balls. Do not scrub or use excessive force. Pat dry using gauze sponges, not tissue or cotton balls. Primary Dressing: Hydrofera Blue Ready Transfer Foam, 2.5x2.5 (in/in) (Generic) Every Other Day/30 Days Discharge Instructions: Apply Hydrofera Blue Ready to wound bed as directed Secondary Dressing: Coverlet Latex-Free Fabric Adhesive Dressings Every Other Day/30 Days Discharge Instructions: 1.5 x 2 Add-Ons: Gauze Non-Bordered 4x4 (in/in) Every Other Day/30 Days Discharge Instructions: Cover with dry gauze, rolled up and placed under left great toe Electronic Signature(s) Signed: 08/21/2021 9:37:42 AM By: Alycia Rossetti Signed: 08/21/2021 4:59:17 PM By: Worthy Keeler PA-C Entered By: Alycia Rossetti on 08/21/2021 09:37:42 [name], [name] ([id number]) -------------------------------------------------------------------------------- Problem List Details Patient Name: [name] Date of Service: 08/21/2021 9:00 AM Medical Record Number: [id number] Patient Account Number: [id number] Date of Birth/Sex: [birth year] (81 y.o. F) Treating RN: Alycia Rossetti Primary Care Provider: Claris Gower Other Clinician: Referring Provider: Claris Gower Treating Provider/Extender: Skipper Cliche in Treatment: 18 Active Problems ICD-10 Encounter Code Description Active Date MDM Diagnosis E11.621 Type 2 diabetes mellitus with foot ulcer 04/17/2021 No Yes L97.522 Non-pressure chronic ulcer of other part of left foot with fat layer 04/17/2021 No Yes exposed Z89.411 Acquired absence of right great  toe 04/17/2021 No Yes Z89.421 Acquired absence of other right toe(s) 04/17/2021 No Yes I10 Essential (primary) hypertension 04/17/2021 No Yes Inactive Problems Resolved Problems Electronic Signature(s) Signed: 08/21/2021 9:19:57 AM By: Worthy Keeler PA-C Entered By: Worthy Keeler on 08/21/2021 09:19:57 [name], [name]  ([id number]) -------------------------------------------------------------------------------- Progress Note Details Patient Name: [name] Date of Service: 08/21/2021 9:00 AM Medical Record Number: [id number] Patient Account Number: [id number] Date of Birth/Sex: [birth year] (81 y.o. F) Treating RN: Alycia Rossetti Primary Care Provider: Claris Gower Other Clinician: Referring Provider: Claris Gower Treating Provider/Extender: Skipper Cliche in Treatment: 18 Subjective Chief Complaint Information obtained from Patient Multiple toe ulcersations History of Present Illness (HPI) 04/17/2021 upon evaluation patient appears to be doing somewhat poorly currently in regard to her first and second toe of the left foot. She previously has been seen by Dr. Doran Durand she saw him on 31 January he did not feel there was any evidence of osteomyelitis. He did give her a thorough evaluation including x-rays and showed no abnormal findings according to notes. With that being said he felt like that wound care would be beneficial therefore she contacted [place]. She has currently been using antibiotic ointment and has noted that this wound on the great toe has been there for about a year. She is not certain whether there is anything on the second toe or not she has not really noted any drainage but at the same time she cannot be sure there is nothing hiding up underneath a significant callus here. Her feet really do not have any major complications with regard to pain and that is good news. She does have neuropathy. Patient does have a history of diabetes mellitus type 2, hypertension, and her most recent hemoglobin A1c was 7.6 on June 2022. 05/08/2021 upon evaluation today patient appears to be doing poorly in regard to her toes unfortunately she has a lot of callus buildup. Its been almost a month since I last saw her. Obviously in this amount of time she has built up quite a bit of callus she was sick that is  the reason she did not make it in. Nonetheless I do think that weekly visits is probably can be the best thing is asking to help to allow these areas to heal much more effectively and quickly. Fortunately I do not see any evidence of active infection locally or systemically which is great news. 05/15/2021 upon evaluation today patient appears to be doing well currently in regard to her wound. Overall I think that we are definitely headed in the right direction with regard to the toes. Fortunately I do not see any signs of active infection locally or systemically at this time which is great news. No fevers, chills, nausea, vomiting, or diarrhea. 05/22/2021 upon evaluation today patient appears to be doing well with regard to her wound. She has been tolerating the dressing changes without complication. Fortunately the second toe is healed the first toe on the left foot is still open but does not appear to be doing nearly as poorly at this time. I do think we can perform a little bit of debridement in order to clear away some of the necrotic debris patient is in agreement with that plan. 05-29-2021 upon evaluation today patient appears to be doing worse currently in regard to the pain experience. Her leg is still very red in fact I think is a little bit worse than last week unfortunately. Last week I was even concerned about a little  bit of cellulitis she really felt like it was more related to her swelling but again I am not certain that that is necessarily the case. Fortunately I do not see any signs of active infection at this time systemically though locally I am definitely concerned in this regard. 06-05-2021 upon evaluation today patient appears to be doing well with regard to her toe ulcer this is worse is also not better though. Unfortunately it is hurting more in her leg in general is also bothering her. This is despite being. She is also tolerating Levaquin with making her dizzy. Nonetheless I am  not certain its helping after she has been on this week I am not seeing much of the improvement with regard to the cellulitis in her leg. She is also previously been on doxycycline although that was towards the end of February. 06-12-2021 upon evaluation today patient appears to be doing well with regard to her wound. We are definitely seeing signs of improvement with regard to size. With that being said I do not see any evidence of active infection at this time which is great news and very pleased in that regard. She is going require some debridement both for callus as well as clearing away some of the slough and biofilm. 07-03-2021 upon evaluation today patient appears to be doing well with regard to his wound on the foot. With that being said he unfortunately did have some of the padding along the heel that got pushed down and subsequently he tells me that it was feeling like it was bunched up under his foot and the heel location and bothering him over the past couple of days. With that being said that when we remove the cast today unbeknownst to [facility] the padding had been pushed down and the cast saw actually nicked his heel this looks like a very light abrasion. Nonetheless he does not have any pain and there is no active bleeding at this time. I actually did take a picture to show him as well and we documented it we will see how this looks obviously next week but I think this will be healed by Wednesday. The wound itself looks to be doing much better there is some callus to remove some slough and biofilm as well. 07-10-2021 upon evaluation today patient appears to be doing well with regard to her wounds. She has been tolerating the dressing changes without complication. Actually I feel like the foot is looking a little better in regard to the great toe. Everything else is doing better as well. I feel like she has been much more careful with that and from the callus standpoint things are greatly  improved it also helps that she shows up on a regular basis. With that being said we are going to be seeing her following her MRI that scheduled for next week. 07-24-2021 upon evaluation today patient appears to be doing well with regard to her wound. She actually is shown signs of improvement there are still some callus not nearly as bad as what it is then I think we need to try to see what we can do to get some of this off today but hopefully will continue to show signs of improvement with the current regimen. She has thought about the hyperbarics she tells me there is just really not can be a way for her to get here regularly for that. 07-31-2021 upon evaluation today patient appears to be doing well with regard to her toe ulcer although  its not significantly smaller its not worse either. She still has a lot of callus buildup. With that being said I think we need to do something to try to keep pressure off of the toe. I think a bolster underneath the big toe could be beneficial in this regard. [name], [name] ([id number]) 08-14-2021 upon evaluation today patient's wound actually showed signs of some improvement she still had callus but for 2 weeks this was not bad at all compared to what we normally find. I do think we are on the right track here and the good news is she seems to be making some excellent progress in my opinion. 08-21-2021 upon evaluation today patient appears to be doing well with regard to her toe ulcer. Unfortunately her silicone toe spacer was too tight for her toe by the time she got home and was already causing her pain. For that reason she has not been wearing that she is putting the rolled up gauze up underneath. Obviously I think this is still a good way to go and better than nothing the biggest thing is that she still is walking without shoes at home I discussed this with her previously but again I have that discussion again today I want her to have shoes on at all times she  has been going around just in her "sock feet". Objective Constitutional Well-nourished and well-hydrated in no acute distress. Vitals Time Taken: 9:24 AM, Height: 61 in, Weight: 250 lbs, BMI: 47.2, Temperature: 97.6 F, Pulse: 75 bpm, Respiratory Rate: 18 breaths/min, Blood Pressure: 146/53 mmHg. Respiratory normal breathing without difficulty. Psychiatric this patient is able to make decisions and demonstrates good insight into disease process. Alert and Oriented x 3. pleasant and cooperative. General Notes: Upon inspection patient's wound bed actually showed signs of good granulation and epithelization at this point. Fortunately I do not see any signs of infection locally or systemically which is great news. No fevers, chills, nausea, vomiting, or diarrhea. Integumentary (Hair, Skin) Wound #1 status is Open. Original cause of wound was Gradually Appeared. The date acquired was: 02/23/2020. The wound has been in treatment 18 weeks. The wound is located on the Left Toe Great. The wound measures 0.2cm length x 0.4cm width x 0.1cm depth; 0.063cm^2 area and 0.006cm^3 volume. There is Fat Layer (Subcutaneous Tissue) exposed. There is a medium amount of serosanguineous drainage noted. The wound margin is thickened. There is medium (34-66%) pink granulation within the wound bed. There is a medium (34-66%) amount of necrotic tissue within the wound bed including Adherent Slough. 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 There was a Excisional Skin/Subcutaneous Tissue Debridement with a  total area of 0.08 sq cm performed by Tommie Sams., PA-C. With the following instrument(s): Curette to remove Viable and Non-Viable tissue/material. Material removed includes Callus, Subcutaneous Tissue, and Slough after achieving pain control using Lidocaine 4% Topical Solution. No specimens were taken. A time out was conducted at 10:00, prior to the start of the procedure. A Minimum amount of bleeding was controlled with Pressure. The procedure was tolerated well with a pain level of 0 throughout and a pain level  of 0 following the procedure. Post Debridement Measurements: 0.3cm length x 0.9cm width x 0.2cm depth; 0.042cm^3 volume. Character of Wound/Ulcer Post Debridement is stable. Severity of Tissue Post Debridement is: Fat layer exposed. Post procedure Diagnosis Wound #1: Same as Pre-Procedure Barros, [place] ([id number]) Plan Follow-up Appointments: Return Appointment in 1 week. Nurse Visit as needed Bathing/ Shower/ Hygiene: Wash wounds with antibacterial soap and water. May shower; gently cleanse wound with antibacterial soap, rinse and pat dry prior to dressing wounds No tub bath. Edema Control - Lymphedema / Segmental Compressive Device / Other: Elevate, Exercise Daily and Avoid Standing for Long Periods of Time. Elevate legs to the level of the heart and pump ankles as often as possible Elevate leg(s) parallel to the floor when sitting. DO YOUR BEST to sleep in the bed at night. DO NOT sleep in your recliner. Long hours of sitting in a recliner leads to swelling of the legs and/or potential wounds on your backside. WOUND #1: - Toe Great Wound Laterality: Left Cleanser: Byram Ancillary Kit - 15 Day Supply (Generic) Every Other Day/30 Days Discharge Instructions: Use supplies as instructed; Kit contains: (15) Saline Bullets; (15) 3x3 Gauze; 15 pr Gloves Cleanser: Normal Saline Every Other Day/30 Days Discharge Instructions: Wash your hands with soap and water. Remove old  dressing, discard into plastic bag and place into trash. Cleanse the wound with Normal Saline prior to applying a clean dressing using gauze sponges, not tissues or cotton balls. Do not scrub or use excessive force. Pat dry using gauze sponges, not tissue or cotton balls. Primary Dressing: Hydrofera Blue Ready Transfer Foam, 2.5x2.5 (in/in) (Generic) Every Other Day/30 Days Discharge Instructions: Apply Hydrofera Blue Ready to wound bed as directed Secondary Dressing: Coverlet Latex-Free Fabric Adhesive Dressings Every Other Day/30 Days Discharge Instructions: 1.5 x 2 Add-Ons: Gauze Non-Bordered 4x4 (in/in) Every Other Day/30 Days Discharge Instructions: Cover with dry gauze, rolled up and placed under left great toe 1. I am going to recommend that we go ahead and continue with the wound care measures as before and the patient is in agreement with plan. This includes the use of the [facility] which I think is doing well. 2. She will secure this with a coverlet/Band-Aid. 3. I am also can recommend the roll gauze up underneath the toe to help hold it off the floor she should be wearing shoes at all times. We will see patient back for reevaluation in 1 week here in the clinic. If anything worsens or changes patient will contact our office for additional recommendations. Electronic Signature(s) Signed: 08/21/2021 3:08:54 PM By: Worthy Keeler PA-C Entered By: Worthy Keeler on 08/21/2021 15:08:54 Cromley, [name] ([id number]) -------------------------------------------------------------------------------- SuperBill Details Patient Name: [name] Date of Service: 08/21/2021 Medical Record Number: [id number] Patient Account Number: [id number] Date of Birth/Sex: August 17, 1940 (81 y.o. F) Treating RN: Alycia Rossetti Primary Care Provider: Claris Gower Other Clinician: Referring Provider: Claris Gower Treating Provider/Extender: Skipper Cliche in Treatment: 18 Diagnosis Coding ICD-10  Codes Code Description E11.621 Type 2 diabetes mellitus with foot ulcer L97.522 Non-pressure chronic ulcer of other part of left foot with fat layer exposed Z89.411 Acquired absence of right great toe Z89.421 Acquired absence of other right toe(s) I10 Essential (primary) hypertension Facility Procedures CPT4 Code: [id number] Description: 63335 - DEB SUBQ TISSUE 20 SQ CM/< Modifier: Quantity: 1 CPT4 Code: Description: ICD-10 Diagnosis Description L97.522 Non-pressure chronic ulcer of other part of left foot with fat layer exp Modifier: osed Quantity: Physician Procedures CPT4  Code: 8628241 Description: 75301 - WC PHYS SUBQ TISS 20 SQ CM Modifier: Quantity: 1 CPT4 Code: Description: ICD-10 Diagnosis Description L97.522 Non-pressure chronic ulcer of other part of left foot with fat layer exp Modifier: osed Quantity: Electronic Signature(s) Signed: 08/21/2021 3:09:03 PM By: Worthy Keeler PA-C Entered By: Worthy Keeler on 08/21/2021 15:09:03

## 2021-08-21 NOTE — Progress Notes (Signed)
Zambrana, [name] ([id number]) Visit Report for 08/21/2021 Arrival Information Details Patient Name: [name], [name] Date of Service: 08/21/2021 9:00 AM Medical Record Number: [id number] Patient Account Number: [id number] Date of Birth/Sex: 04-27-1940 (81 y.o. F) Treating RN: Alycia Rossetti Primary Care Christphor Groft: Claris Gower Other Clinician: Referring Damondre Pfeifle: Claris Gower Treating Bonifacio Pruden/Extender: Skipper Cliche in Treatment: 18 Visit Information History Since Last Visit Added or deleted any medications: No Patient Arrived: Ambulatory Any new allergies or adverse reactions: No Arrival Time: 08:58 Had a fall or experienced change in No Accompanied By: self activities of daily living that may affect Transfer Assistance: None risk of falls: Patient Identification Verified: Yes Hospitalized since last visit: No Secondary Verification Process Completed: Yes Has Dressing in Place as Prescribed: Yes Patient Requires Transmission-Based Precautions: No Pain Present Now: No Patient Has Alerts: No Electronic Signature(s) Signed: 08/21/2021 9:28:23 AM By: Alycia Rossetti Entered By: Alycia Rossetti on 08/21/2021 09:28:23 Wahab, [name] ([id number]) -------------------------------------------------------------------------------- Encounter Discharge Information Details Patient Name: [name] Date of Service: 08/21/2021 9:00 AM Medical Record Number: [id number] Patient Account Number: [id number] Date of Birth/Sex: 1941/01/28 (81 y.o. F) Treating RN: Alycia Rossetti Primary Care Jaeger Trueheart: Claris Gower Other Clinician: Referring Michal Callicott: Claris Gower Treating Lynzi Meulemans/Extender: Skipper Cliche in Treatment: 18 Encounter Discharge Information Items Post Procedure Vitals Discharge Condition: Stable Temperature (F): 97.6 Ambulatory Status: Ambulatory Pulse (bpm): 75 Discharge Destination: Home Respiratory Rate (breaths/min): 18 Transportation: Private Auto Blood Pressure  (mmHg): 146/53 Accompanied By: Driver Schedule Follow-up Appointment: Yes Clinical Summary of Care: Electronic Signature(s) Signed: 08/21/2021 4:01:56 PM By: Alycia Rossetti Previous Signature: 08/21/2021 9:38:44 AM Version By: Alycia Rossetti Entered By: Alycia Rossetti on 08/21/2021 10:06:35 Stern, [name] ([id number]) -------------------------------------------------------------------------------- Lower Extremity Assessment Details Patient Name: [name] Date of Service: 08/21/2021 9:00 AM Medical Record Number: [id number] Patient Account Number: [id number] Date of Birth/Sex: 09/30/1940 (81 y.o. F) Treating RN: Alycia Rossetti Primary Care Desirie Minteer: Claris Gower Other Clinician: Referring Sadaf Przybysz: Claris Gower Treating Wilhemina Grall/Extender: Skipper Cliche in Treatment: 18 Edema Assessment Assessed: [Left: No] Patrice Paradise: No] [Left: Edema] [Right: :] Calf Left: Right: Point of Measurement: 39 cm From Medial Instep 40.1 cm Ankle Left: Right: Point of Measurement: 10 cm From Medial Instep 27.6 cm Vascular Assessment Pulses: Dorsalis Pedis Palpable: [Left:Yes] Electronic Signature(s) Signed: 08/21/2021 9:29:38 AM By: Alycia Rossetti Entered By: Alycia Rossetti on 08/21/2021 09:29:37 Rochin, [name] ([id number]) -------------------------------------------------------------------------------- Multi Wound Chart Details Patient Name: [name] Date of Service: 08/21/2021 9:00 AM Medical Record Number: [id number] Patient Account Number: [id number] Date of Birth/Sex: 10-03-1940 (81 y.o. F) Treating RN: Alycia Rossetti Primary Care Ayaat Jansma: Claris Gower Other Clinician: Referring Beyounce Dickens: Claris Gower Treating Dimetrius Montfort/Extender: Skipper Cliche in Treatment: 18 Vital Signs Height(in): 61 Pulse(bpm): 17 Weight(lbs): 250 Blood Pressure(mmHg): 146/53 Body Mass Index(BMI): 47.2 Temperature(F): 97.6 Respiratory Rate(breaths/min): 18 Photos: [N/A:N/A] Wound Location: Left Toe  Great N/A N/A Wounding Event: Gradually Appeared N/A N/A Primary Etiology: Diabetic Wound/Ulcer of the Lower N/A N/A Extremity Comorbid History: Chronic sinus problems/congestion, N/A N/A Anemia, Sleep Apnea, Congestive Heart Failure, Hypertension, Type II Diabetes, Neuropathy Date Acquired: 02/23/2020 N/A N/A Weeks of Treatment: 18 N/A N/A Wound Status: Open N/A N/A Wound Recurrence: No N/A N/A Measurements L x W x D (cm) 0.2x0.4x0.1 N/A N/A Area (cm) : 0.063 N/A N/A Volume (cm) : 0.006 N/A N/A % Reduction in Area: 98.70% N/A N/A % Reduction in Volume: 98.70% N/A N/A Classification: Grade 1 N/A N/A Exudate Amount: Medium N/A N/A Exudate Type: Serosanguineous N/A N/A Exudate  Color: red, brown N/A N/A Wound Margin: Thickened N/A N/A Granulation Amount: Medium (34-66%) N/A N/A Granulation Quality: Pink N/A N/A Necrotic Amount: Medium (34-66%) N/A N/A Exposed Structures: Fat Layer (Subcutaneous Tissue): N/A N/A Yes Epithelialization: None N/A N/A Treatment Notes Electronic Signature(s) Signed: 08/21/2021 9:29:54 AM By: Alycia Rossetti Entered By: Alycia Rossetti on 08/21/2021 09:29:53 Caridi, [name] ([id number]) -------------------------------------------------------------------------------- [place] Details Patient Name: [name] Date of Service: 08/21/2021 9:00 AM Medical Record Number: [id number] Patient Account Number: [id number] Date of Birth/Sex: 02-22-1941 (81 y.o. F) Treating RN: Alycia Rossetti Primary Care Jazmene Racz: Claris Gower Other Clinician: Referring Dejana Pugsley: Claris Gower Treating Brevin Mcfadden/Extender: Skipper Cliche in Treatment: 18 Active Inactive Wound/Skin Impairment Nursing Diagnoses: Impaired tissue integrity Knowledge deficit related to ulceration/compromised skin integrity Goals: Ulcer/skin breakdown will have a volume reduction of 30% by week 4 Date Initiated: 04/17/2021 Date Inactivated: 07/03/2021 Target Resolution Date:  05/15/2021 Goal Status: Unmet Unmet Reason: comorbities Ulcer/skin breakdown will have a volume reduction of 50% by week 8 Date Initiated: 04/17/2021 Date Inactivated: 07/03/2021 Target Resolution Date: 06/12/2021 Goal Status: Met Ulcer/skin breakdown will have a volume reduction of 80% by week 12 Date Initiated: 04/17/2021 Target Resolution Date: 07/10/2021 Goal Status: Active Ulcer/skin breakdown will heal within 14 weeks Date Initiated: 04/17/2021 Target Resolution Date: 07/24/2021 Goal Status: Active Interventions: Assess patient/caregiver ability to obtain necessary supplies Assess patient/caregiver ability to perform ulcer/skin care regimen upon admission and as needed Assess ulceration(s) every visit Provide education on ulcer and skin care Notes: Electronic Signature(s) Signed: 08/21/2021 9:29:45 AM By: Alycia Rossetti Entered By: Alycia Rossetti on 08/21/2021 09:29:45 Zagami, [name] ([id number]) -------------------------------------------------------------------------------- Pain Assessment Details Patient Name: [name] Date of Service: 08/21/2021 9:00 AM Medical Record Number: [id number] Patient Account Number: [id number] Date of Birth/Sex: [birth year] (81 y.o. F) Treating RN: Alycia Rossetti Primary Care Aztlan Coll: Claris Gower Other Clinician: Referring Nyima Vanacker: Claris Gower Treating Eissa Buchberger/Extender: Skipper Cliche in Treatment: 18 Active Problems Location of Pain Severity and Description of Pain Patient Has Paino No Site Locations Pain Management and Medication Current Pain Management: Electronic Signature(s) Signed: 08/21/2021 9:29:13 AM By: Alycia Rossetti Entered By: Alycia Rossetti on 08/21/2021 09:29:13 Gerald, [name] ([id number]) -------------------------------------------------------------------------------- Wound Assessment Details Patient Name: [name] Date of Service: 08/21/2021 9:00 AM Medical Record Number: [id number] Patient Account Number:  [id number] Date of Birth/Sex: 1940-06-03 (81 y.o. F) Treating RN: Alycia Rossetti Primary Care Lipa Knauff: Claris Gower Other Clinician: Referring Adine Heimann: Claris Gower Treating Stryker Veasey/Extender: Skipper Cliche in Treatment: 18 Wound Status Wound Number: 1 Primary Diabetic Wound/Ulcer of the Lower Extremity Etiology: Wound Location: Left Toe Great Wound Open Wounding Event: Gradually Appeared Status: Date Acquired: 02/23/2020 Comorbid Chronic sinus problems/congestion, Anemia, Sleep Apnea, Weeks Of Treatment: 18 History: Congestive Heart Failure, Hypertension, Type II Diabetes, Clustered Wound: No Neuropathy Photos Wound Measurements Length: (cm) 0.2 Width: (cm) 0.4 Depth: (cm) 0.1 Area: (cm) 0.063 Volume: (cm) 0.006 % Reduction in Area: 98.7% % Reduction in Volume: 98.7% Epithelialization: None Wound Description Classification: Grade 1 Wound Margin: Thickened Exudate Amount: Medium Exudate Type: Serosanguineous Exudate Color: red, brown Foul Odor After Cleansing: No Slough/Fibrino Yes Wound Bed Granulation Amount: Medium (34-66%) Exposed Structure Granulation Quality: Pink Fat Layer (Subcutaneous Tissue) Exposed: Yes Necrotic Amount: Medium (34-66%) Necrotic Quality: Adherent Slough Treatment Notes Wound #1 (Toe Great) Wound Laterality: Left Cleanser Byram Ancillary Kit - 15 Day Supply Discharge Instruction: Use supplies as instructed; Kit contains: (15) Saline Bullets; (15) 3x3 Gauze; 15 pr Gloves Normal Saline Discharge Instruction: Wash your  hands with soap and water. Remove old dressing, discard into plastic bag and place into trash. Cleanse the wound with Normal Saline prior to applying a clean dressing using gauze sponges, not tissues or cotton balls. Do not scrub or use excessive force. Pat dry using gauze sponges, not tissue or cotton balls. Goldinger, [name] ([id number]) Peri-Wound Care Topical Primary Dressing Hydrofera Blue Ready Transfer Foam,  2.5x2.5 (in/in) Discharge Instruction: Apply Hydrofera Blue Ready to wound bed as directed Secondary Dressing Coverlet Latex-Free Fabric Adhesive Dressings Discharge Instruction: 1.5 x 2 Secured With Compression Wrap Compression Stockings Add-Ons Gauze Non-Bordered 4x4 (in/in) Discharge Instruction: Cover with dry gauze, rolled up and placed under left great toe Electronic Signature(s) Signed: 08/21/2021 4:01:56 PM By: Alycia Rossetti Entered By: Alycia Rossetti on 08/21/2021 09:21:43 Thorner, [name] ([id number]) -------------------------------------------------------------------------------- Vitals Details Patient Name: [name] Date of Service: 08/21/2021 9:00 AM Medical Record Number: [id number] Patient Account Number: [id number] Date of Birth/Sex: 01-31-1941 (81 y.o. F) Treating RN: Alycia Rossetti Primary Care Turquoise Esch: Claris Gower Other Clinician: Referring Deke Tilghman: Claris Gower Treating Kazuki Ingle/Extender: Skipper Cliche in Treatment: 18 Vital Signs Time Taken: 09:24 Temperature (F): 97.6 Height (in): 61 Pulse (bpm): 75 Weight (lbs): 250 Respiratory Rate (breaths/min): 18 Body Mass Index (BMI): 47.2 Blood Pressure (mmHg): 146/53 Reference Range: 80 - 120 mg / dl Electronic Signature(s) Signed: 08/21/2021 9:29:07 AM By: Alycia Rossetti Entered By: Alycia Rossetti on 08/21/2021 09:29:07

## 2021-08-28 ENCOUNTER — Ambulatory Visit: Payer: Medicare Other | Admitting: Physician Assistant

## 2021-09-04 ENCOUNTER — Ambulatory Visit: Payer: Medicare Other | Admitting: Physician Assistant

## 2021-10-02 ENCOUNTER — Encounter: Payer: Medicare Other | Attending: Physician Assistant | Admitting: Physician Assistant

## 2021-10-02 DIAGNOSIS — E11621 Type 2 diabetes mellitus with foot ulcer: Secondary | ICD-10-CM | POA: Insufficient documentation

## 2021-10-02 DIAGNOSIS — E1140 Type 2 diabetes mellitus with diabetic neuropathy, unspecified: Secondary | ICD-10-CM | POA: Insufficient documentation

## 2021-10-02 DIAGNOSIS — I509 Heart failure, unspecified: Secondary | ICD-10-CM | POA: Insufficient documentation

## 2021-10-02 DIAGNOSIS — L97522 Non-pressure chronic ulcer of other part of left foot with fat layer exposed: Secondary | ICD-10-CM | POA: Diagnosis not present

## 2021-10-02 DIAGNOSIS — I110 Hypertensive heart disease with heart failure: Secondary | ICD-10-CM | POA: Insufficient documentation

## 2021-10-02 DIAGNOSIS — L97822 Non-pressure chronic ulcer of other part of left lower leg with fat layer exposed: Secondary | ICD-10-CM | POA: Insufficient documentation

## 2021-10-02 NOTE — Progress Notes (Signed)
Mandelbaum, [name] ([id number]) Visit Report for 10/02/2021 Chief Complaint Document Details Patient Name: Cheryl, [name] Date of Service: 10/02/2021 9:00 AM Medical Record Number: [id number] Patient Account Number: [id number] Date of Birth/Sex: 1940-03-10 (81 y.o. F) Treating RN: Cornell Barman Primary Care Provider: Claris Gower Other Clinician: Massie Kluver Referring Provider: Claris Gower Treating Provider/Extender: Skipper Cliche in Treatment: 24 Information Obtained from: Patient Chief Complaint Multiple toe ulcersations Electronic Signature(s) Signed: 10/02/2021 9:07:20 AM By: Worthy Keeler PA-C Entered By: Worthy Keeler on 10/02/2021 09:07:20 Oconnor, [name] ([id number]) -------------------------------------------------------------------------------- Problem List Details Patient Name: [name] Date of Service: 10/02/2021 9:00 AM Medical Record Number: [id number] Patient Account Number: [id number] Date of Birth/Sex: 01/10/1941 (81 y.o. F) Treating RN: Cornell Barman Primary Care Provider: Claris Gower Other Clinician: Massie Kluver Referring Provider: Claris Gower Treating Provider/Extender: Skipper Cliche in Treatment: 24 Active Problems ICD-10 Encounter Code Description Active Date MDM Diagnosis E11.621 Type 2 diabetes mellitus with foot ulcer 04/17/2021 No Yes L97.522 Non-pressure chronic ulcer of other part of left foot with fat layer 04/17/2021 No Yes exposed Z89.411 Acquired absence of right great toe 04/17/2021 No Yes Z89.421 Acquired absence of other right toe(s) 04/17/2021 No Yes I10 Essential (primary) hypertension 04/17/2021 No Yes Inactive Problems Resolved Problems Electronic Signature(s) Signed: 10/02/2021 9:07:14 AM By: Worthy Keeler PA-C Entered By: Worthy Keeler on 10/02/2021 09:07:14

## 2021-10-02 NOTE — Progress Notes (Signed)
Rainey, [name] ([id number]) Visit Report for 10/02/2021 Arrival Information Details Patient Name: [name] Date of Service: 10/02/2021 9:00 AM Medical Record Number: [id number] Patient Account Number: [id number] Date of Birth/Sex: [birth year] (81 y.o. F) Treating RN: Cornell Barman Primary Care Dimitry Holsworth: Claris Gower Other Clinician: Massie [name] Referring Isabelle Matt: Claris Gower Treating Doshie Maggi/Extender: Skipper Cliche in Treatment: 24 Visit Information History Since Last Visit All ordered tests and consults were completed: No Patient Arrived: [name] Added or deleted any medications: No Arrival Time: 08:53 Any new allergies or adverse reactions: No Transfer Assistance: None Had a fall or experienced change in No Patient Requires Transmission-Based Precautions: No activities of daily living that may affect Patient Has Alerts: No risk of falls: Hospitalized since last visit: No Pain Present Now: No Electronic Signature(s) Signed: 10/02/2021 4:28:06 PM By: Massie [name] Entered By: Massie [name] on 10/02/2021 08:54:58 [name] ([id number]) -------------------------------------------------------------------------------- Clinic Level of Care Assessment Details Patient Name: [name] Date of Service: 10/02/2021 9:00 AM Medical Record Number: [id number] Patient Account Number: [id number] Date of Birth/Sex: 08/05/1940 (81 y.o. F) Treating RN: Cornell Barman Primary Care Aitan Rossbach: Claris Gower Other Clinician: Massie [name] Referring Dimitry Holsworth: Claris Gower Treating Mona Ayars/Extender: Skipper Cliche in Treatment: 24 Clinic Level of Care Assessment Items TOOL 1 Quantity Score '[]'  - Use when EandM and Procedure is performed on INITIAL visit 0 ASSESSMENTS - Nursing Assessment / Reassessment '[]'  - General Physical Exam (combine w/ comprehensive assessment (listed just below) when performed on new 0 pt. evals) '[]'  - 0 Comprehensive Assessment (HX, ROS, Risk  Assessments, Wounds Hx, etc.) ASSESSMENTS - Wound and Skin Assessment / Reassessment '[]'  - Dermatologic / Skin Assessment (not related to wound area) 0 ASSESSMENTS - Ostomy and/or Continence Assessment and Care '[]'  - Incontinence Assessment and Management 0 '[]'  - 0 Ostomy Care Assessment and Management (repouching, etc.) PROCESS - Coordination of Care '[]'  - Simple Patient / Family Education for ongoing care 0 '[]'  - 0 Complex (extensive) Patient / Family Education for ongoing care '[]'  - 0 Staff obtains Programmer, systems, Records, Test Results / Process Orders '[]'  - 0 Staff telephones HHA, Nursing Homes / Clarify orders / etc '[]'  - 0 Routine Transfer to another Facility (non-emergent condition) '[]'  - 0 Routine Hospital Admission (non-emergent condition) '[]'  - 0 New Admissions / Biomedical engineer / Ordering NPWT, Apligraf, etc. '[]'  - 0 Emergency Hospital Admission (emergent condition) PROCESS - Special Needs '[]'  - Pediatric / Minor Patient Management 0 '[]'  - 0 Isolation Patient Management '[]'  - 0 Hearing / Language / Visual special needs '[]'  - 0 Assessment of Community assistance (transportation, D/C planning, etc.) '[]'  - 0 Additional assistance / Altered mentation '[]'  - 0 Support Surface(s) Assessment (bed, cushion, seat, etc.) INTERVENTIONS - Miscellaneous '[]'  - External ear exam 0 '[]'  - 0 Patient Transfer (multiple staff / Civil Service fast streamer / Similar devices) '[]'  - 0 Simple Staple / Suture removal (25 or less) '[]'  - 0 Complex Staple / Suture removal (26 or more) '[]'  - 0 Hypo/Hyperglycemic Management (do not check if billed separately) '[]'  - 0 Ankle / Brachial Index (ABI) - do not check if billed separately Has the patient been seen at the hospital within the last three years: Yes Total Score: 0 Level Of Care: ____ [name] ([id number]) Electronic Signature(s) Signed: 10/02/2021 4:28:06 PM By: Massie [name] Entered By: Massie [name] on 10/02/2021 09:25:43 [name], [name]  ([id number]) -------------------------------------------------------------------------------- Encounter Discharge Information Details Patient Name: [name] Date of Service: 10/02/2021 9:00 AM Medical  Record Number: [id number] Patient Account Number: [id number] Date of Birth/Sex: November 20, 1940 (81 y.o. F) Treating RN: Cornell Barman Primary Care Temitope Griffing: Claris Gower Other Clinician: Massie [name] Referring Raiden Haydu: Claris Gower Treating Lorik Guo/Extender: Skipper Cliche in Treatment: 24 Encounter Discharge Information Items Post Procedure Vitals Discharge Condition: Stable Temperature (F): 97.9 Ambulatory Status: Cane Pulse (bpm): 80 Discharge Destination: Home Respiratory Rate (breaths/min): 18 Transportation: Private Auto Blood Pressure (mmHg): 130/49 Accompanied By: self Schedule Follow-up Appointment: Yes Clinical Summary of Care: Electronic Signature(s) Signed: 10/02/2021 4:28:06 PM By: Massie [name] Entered By: Massie [name] on 10/02/2021 09:33:07 [name], [name] ([id number]) -------------------------------------------------------------------------------- Lower Extremity Assessment Details Patient Name: [name] Date of Service: 10/02/2021 9:00 AM Medical Record Number: [id number] Patient Account Number: [id number] Date of Birth/Sex: 1940/06/14 (81 y.o. F) Treating RN: Cornell Barman Primary Care Jamera Vanloan: Claris Gower Other Clinician: Massie [name] Referring Lewi Drost: Claris Gower Treating Mclean Moya/Extender: Jeri Cos Weeks in Treatment: 24 Edema Assessment Assessed: [name]: Yes] Patrice Paradise: No] Edema: [Left: Ye] [Right: s] Calf Left: Right: Point of Measurement: 39 cm From Medial Instep 41 cm Ankle Left: Right: Point of Measurement: 10 cm From Medial Instep 26.1 cm Vascular Assessment Pulses: Dorsalis Pedis Palpable: [Left:Yes] Electronic Signature(s) Signed: 10/02/2021 9:12:30 AM By: Gretta Cool, BSN, RN, CWS, [name] RN, BSN Signed: 10/02/2021  4:28:06 PM By: Massie [name] Entered By: Massie [name] on 10/02/2021 09:04:31 [name], [name] ([id number]) -------------------------------------------------------------------------------- Multi Wound Chart Details Patient Name: [name] Date of Service: 10/02/2021 9:00 AM Medical Record Number: [id number] Patient Account Number: [id number] Date of Birth/Sex: Sep 10, 1940 (81 y.o. F) Treating RN: Cornell Barman Primary Care Tahj Lindseth: Claris Gower Other Clinician: Massie [name] Referring Meghna Hagmann: Claris Gower Treating Emery Dupuy/Extender: Skipper Cliche in Treatment: 24 Vital Signs Height(in): 61 Pulse(bpm): 80 Weight(lbs): 250 Blood Pressure(mmHg): 130/49 Body Mass Index(BMI): 47.2 Temperature(F): 97.9 Respiratory Rate(breaths/min): 18 Photos: [N/A:N/A] Wound Location: Left Toe Great N/A N/A Wounding Event: Gradually Appeared N/A N/A Primary Etiology: Diabetic Wound/Ulcer of the Lower N/A N/A Extremity Comorbid History: Chronic sinus problems/congestion, N/A N/A Anemia, Sleep Apnea, Congestive Heart Failure, Hypertension, Type II Diabetes, Neuropathy Date Acquired: 02/23/2020 N/A N/A Weeks of Treatment: 24 N/A N/A Wound Status: Open N/A N/A Wound Recurrence: No N/A N/A Measurements L x W x D (cm) 0.5x0.7x0.2 N/A N/A Area (cm) : 0.275 N/A N/A Volume (cm) : 0.055 N/A N/A % Reduction in Area: 94.20% N/A N/A % Reduction in Volume: 88.30% N/A N/A Classification: Grade 1 N/A N/A Exudate Amount: Medium N/A N/A Exudate Type: Serosanguineous N/A N/A Exudate Color: red, brown N/A N/A Wound Margin: Thickened N/A N/A Granulation Amount: Medium (34-66%) N/A N/A Granulation Quality: Pink N/A N/A Necrotic Amount: Medium (34-66%) N/A N/A Exposed Structures: Fat Layer (Subcutaneous Tissue): N/A N/A Yes Epithelialization: None N/A N/A Treatment Notes Electronic Signature(s) Signed: 10/02/2021 4:28:06 PM By: Massie [name] Entered By: Massie [name] on 10/02/2021  09:15:27 [name], [name] ([id number]) -------------------------------------------------------------------------------- Multi-Disciplinary Care Plan Details Patient Name: [name] Date of Service: 10/02/2021 9:00 AM Medical Record Number: [id number] Patient Account Number: [id number] Date of Birth/Sex: Feb 11, 1941 (81 y.o. F) Treating RN: Cornell Barman Primary Care Channell Quattrone: Claris Gower Other Clinician: Massie [name] Referring Jennings Stirling: Claris Gower Treating Aastha Dayley/Extender: Skipper Cliche in Treatment: 24 Active Inactive Wound/Skin Impairment Nursing Diagnoses: Impaired tissue integrity Knowledge deficit related to ulceration/compromised skin integrity Goals: Ulcer/skin breakdown will have a volume reduction of 30% by week 4 Date Initiated: 04/17/2021 Date Inactivated: 07/03/2021 Target Resolution Date: 05/15/2021 Goal Status: Unmet Unmet Reason: comorbities Ulcer/skin breakdown will have a volume reduction  of 50% by week 8 Date Initiated: 04/17/2021 Date Inactivated: 07/03/2021 Target Resolution Date: 06/12/2021 Goal Status: Met Ulcer/skin breakdown will have a volume reduction of 80% by week 12 Date Initiated: 04/17/2021 Target Resolution Date: 07/10/2021 Goal Status: Active Ulcer/skin breakdown will heal within 14 weeks Date Initiated: 04/17/2021 Target Resolution Date: 07/24/2021 Goal Status: Active Interventions: Assess patient/caregiver ability to obtain necessary supplies Assess patient/caregiver ability to perform ulcer/skin care regimen upon admission and as needed Assess ulceration(s) every visit Provide education on ulcer and skin care Notes: Electronic Signature(s) Signed: 10/02/2021 9:12:30 AM By: Gretta Cool, BSN, RN, CWS, [name] RN, BSN Signed: 10/02/2021 4:28:06 PM By: Massie [name] Entered By: Massie [name] on 10/02/2021 09:04:36 [name], [name] ([id number]) -------------------------------------------------------------------------------- Pain Assessment  Details Patient Name: [name] Date of Service: 10/02/2021 9:00 AM Medical Record Number: [id number] Patient Account Number: [id number] Date of Birth/Sex: 1940-11-17 (81 y.o. F) Treating RN: Cornell Barman Primary Care Daizha Anand: Claris Gower Other Clinician: Massie [name] Referring Melquiades Kovar: Claris Gower Treating Shannon Balthazar/Extender: Skipper Cliche in Treatment: 24 Active Problems Location of Pain Severity and Description of Pain Patient Has Paino No Site Locations Pain Management and Medication Current Pain Management: Electronic Signature(s) Signed: 10/02/2021 9:12:30 AM By: Gretta Cool, BSN, RN, CWS, [name] RN, BSN Signed: 10/02/2021 4:28:06 PM By: Massie [name] Entered By: Massie [name] on 10/02/2021 08:57:59 [name], [name] ([id number]) -------------------------------------------------------------------------------- Patient/Caregiver Education Details Patient Name: [name] Date of Service: 10/02/2021 9:00 AM Medical Record Number: [id number] Patient Account Number: [id number] Date of Birth/Gender: 1940/05/30 (81 y.o. F) Treating RN: Cornell Barman Primary Care Physician: Claris Gower Other Clinician: Massie [name] Referring Physician: Claris Gower Treating Physician/Extender: Skipper Cliche in Treatment: 24 Education Assessment Education Provided To: Patient Education Topics Provided Wound/Skin Impairment: Handouts: Other: continue wound care as directed Electronic Signature(s) Signed: 10/02/2021 4:28:06 PM By: Massie [name] Entered By: Massie [name] on 10/02/2021 09:31:53 [name], [name] ([id number]) -------------------------------------------------------------------------------- Wound Assessment Details Patient Name: [name] Date of Service: 10/02/2021 9:00 AM Medical Record Number: [id number] Patient Account Number: [id number] Date of Birth/Sex: Jan 21, 1941 (81 y.o. F) Treating RN: Cornell Barman Primary Care Iretha Kirley: Claris Gower Other  Clinician: Massie [name] Referring Naiyana Barbian: Claris Gower Treating [name]/Extender: Jeri Cos Weeks in Treatment: 24 Wound Status Wound Number: 1 Primary Diabetic Wound/Ulcer of the Lower Extremity Etiology: Wound Location: Left Toe Great Wound Open Wounding Event: Gradually Appeared Status: Date Acquired: 02/23/2020 Comorbid Chronic sinus problems/congestion, Anemia, Sleep Apnea, Weeks Of Treatment: 24 History: Congestive Heart Failure, Hypertension, Type II Diabetes, Clustered Wound: No Neuropathy Photos Wound Measurements Length: (cm) 0.5 Width: (cm) 0.7 Depth: (cm) 0.2 Area: (cm) 0.275 Volume: (cm) 0.055 % Reduction in Area: 94.2% % Reduction in Volume: 88.3% Epithelialization: None Wound Description Classification: Grade 1 Wound Margin: Thickened Exudate Amount: Medium Exudate Type: Serosanguineous Exudate Color: red, brown Foul Odor After Cleansing: No Slough/Fibrino Yes Wound Bed Granulation Amount: Medium (34-66%) Exposed Structure Granulation Quality: Pink Fat Layer (Subcutaneous Tissue) Exposed: Yes Necrotic Amount: Medium (34-66%) Necrotic Quality: Adherent Slough Treatment Notes Wound #1 (Toe Great) Wound Laterality: Left Cleanser [place] Ancillary Kit - 15 Day Supply Discharge Instruction: Use supplies as instructed; Kit contains: (15) Saline Bullets; (15) 3x3 Gauze; 15 pr Gloves Normal Saline Discharge Instruction: Wash your hands with soap and water. Remove old dressing, discard into plastic bag and place into trash. Cleanse the wound with Normal Saline prior to applying a clean dressing using gauze sponges, not tissues or cotton balls. Do not scrub or use excessive force. Pat dry using gauze  sponges, not tissue or cotton balls. Mapp, [name] ([id number]) Peri-Wound Care Topical Primary Dressing Hydrofera Blue Ready Transfer Foam, 2.5x2.5 (in/in) Discharge Instruction: Apply Hydrofera Blue Ready to wound bed as directed Secondary  Dressing Coverlet Latex-Free Fabric Adhesive Dressings Discharge Instruction: 1.5 x 2 Secured With Compression Wrap Compression Stockings Add-Ons Gauze Non-Bordered 4x4 (in/in) Discharge Instruction: Cover with dry gauze, rolled up and placed under left great toe Electronic Signature(s) Signed: 10/02/2021 9:12:30 AM By: Gretta Cool, BSN, RN, CWS, [name] RN, BSN Signed: 10/02/2021 4:28:06 PM By: Massie [name] Entered By: Massie [name] on 10/02/2021 09:03:15 [name], [name] ([id number]) -------------------------------------------------------------------------------- Vitals Details Patient Name: [name] Date of Service: 10/02/2021 9:00 AM Medical Record Number: [id number] Patient Account Number: [id number] Date of Birth/Sex: 1940/05/04 (81 y.o. F) Treating RN: Cornell Barman Primary Care Biannca Scantlin: Claris Gower Other Clinician: Massie [name] Referring Theodoros Stjames: Claris Gower Treating [name]/Extender: Skipper Cliche in Treatment: 24 Vital Signs Time Taken: 08:55 Temperature (F): 97.9 Height (in): 61 Pulse (bpm): 80 Weight (lbs): 250 Respiratory Rate (breaths/min): 18 Body Mass Index (BMI): 47.2 Blood Pressure (mmHg): 130/49 Reference Range: 80 - 120 mg / dl Electronic Signature(s) Signed: 10/02/2021 4:28:06 PM By: Massie [name] Entered By: Massie [name] on 10/02/2021 08:57:55

## 2021-10-09 ENCOUNTER — Encounter: Payer: Medicare Other | Admitting: Physician Assistant

## 2021-10-09 DIAGNOSIS — E11621 Type 2 diabetes mellitus with foot ulcer: Secondary | ICD-10-CM | POA: Diagnosis not present

## 2021-10-10 NOTE — Progress Notes (Addendum)
[name], [name] ([id number]) Visit Report for 10/09/2021 Chief Complaint Document Details Patient Name: [name] Date of Service: 10/09/2021 9:00 AM Medical Record Number: [id number] Patient Account Number: [id number] Date of Birth/Sex: 02/09/1941 (81 y.o. F) Treating RN: Cornell Barman Primary Care Provider: Claris Gower Other Clinician: Massie Kluver Referring Provider: Claris Gower Treating Provider/Extender: Skipper Cliche in Treatment: 25 Information Obtained from: Patient Chief Complaint Multiple toe ulcersations Electronic Signature(s) Signed: 10/09/2021 9:18:57 AM By: Worthy Keeler PA-C Entered By: Worthy Keeler on 10/09/2021 09:18:57 Derick, [name] ([id number]) -------------------------------------------------------------------------------- Debridement Details Patient Name: [name] Date of Service: 10/09/2021 9:00 AM Medical Record Number: [id number] Patient Account Number: [id number] Date of Birth/Sex: [birth year] (81 y.o. F) Treating RN: Cornell Barman Primary Care Provider: Claris Gower Other Clinician: Massie Kluver Referring Provider: Claris Gower Treating Provider/Extender: Skipper Cliche in Treatment: 25 Debridement Performed for Wound #1 Left Toe Great Assessment: Performed By: Physician Tommie Sams., PA-C Debridement Type: Debridement Severity of Tissue Pre Debridement: Fat layer exposed Level of Consciousness (Pre- Awake and Alert procedure): Pre-procedure Verification/Time Out Yes - 09:46 Taken: Start Time: 09:46 Total Area Debrided (L x W): 0.5 (cm) x 0.8 (cm) = 0.4 (cm) Tissue and other material Viable, Non-Viable, Callus, Slough, Subcutaneous, Slough debrided: Level: Skin/Subcutaneous Tissue Debridement Description: Excisional Instrument: Curette Bleeding: Minimum Hemostasis Achieved: Pressure End Time: 09:49 Response to Treatment: Procedure was tolerated well Level of Consciousness (Post- Awake and  Alert procedure): Post Debridement Measurements of Total Wound Length: (cm) 0.3 Width: (cm) 0.6 Depth: (cm) 0.2 Volume: (cm) 0.028 Character of Wound/Ulcer Post Debridement: Stable Severity of Tissue Post Debridement: Fat layer exposed Post Procedure Diagnosis Same as Pre-procedure Electronic Signature(s) Signed: 10/09/2021 10:15:18 AM By: Gretta Cool, BSN, RN, CWS, [name] RN, BSN Signed: 10/09/2021 12:34:53 PM By: Worthy Keeler PA-C Signed: 10/09/2021 1:18:57 PM By: Massie Kluver Entered By: Massie Kluver on 10/09/2021 09:49:03 [name], [name] ([id number]) -------------------------------------------------------------------------------- HPI Details Patient Name: [name] Date of Service: 10/09/2021 9:00 AM Medical Record Number: [id number] Patient Account Number: [id number] Date of Birth/Sex: 10-Apr-1940 (81 y.o. F) Treating RN: Cornell Barman Primary Care Provider: Claris Gower Other Clinician: Massie Kluver Referring Provider: Claris Gower Treating Provider/Extender: Skipper Cliche in Treatment: 25 History of Present Illness HPI Description: 04/17/2021 upon evaluation patient appears to be doing somewhat poorly currently in regard to her first and second toe of the left foot. She previously has been seen by Dr. Doran Durand she saw him on 31 January he did not feel there was any evidence of osteomyelitis. He did give her a thorough evaluation including x-rays and showed no abnormal findings according to notes. With that being said he felt like that wound care would be beneficial therefore she contacted [place]. She has currently been using antibiotic ointment and has noted that this wound on the great toe has been there for about a year. She is not certain whether there is anything on the second toe or not she has not really noted any drainage but at the same time she cannot be sure there is nothing hiding up underneath a significant callus here. Her feet really do not have any  major complications with regard to pain and that is good news. She does have neuropathy. Patient does have a history of diabetes mellitus type 2, hypertension, and her most recent hemoglobin A1c was 7.6 on June 2022. 05/08/2021 upon evaluation today patient appears to be doing poorly in regard to her toes unfortunately she has a lot  of callus buildup. Its been almost a month since I last saw her. Obviously in this amount of time she has built up quite a bit of callus she was sick that is the reason she did not make it in. Nonetheless I do think that weekly visits is probably can be the best thing is asking to help to allow these areas to heal much more effectively and quickly. Fortunately I do not see any evidence of active infection locally or systemically which is great news. 05/15/2021 upon evaluation today patient appears to be doing well currently in regard to her wound. Overall I think that we are definitely headed in the right direction with regard to the toes. Fortunately I do not see any signs of active infection locally or systemically at this time which is great news. No fevers, chills, nausea, vomiting, or diarrhea. 05/22/2021 upon evaluation today patient appears to be doing well with regard to her wound. She has been tolerating the dressing changes without complication. Fortunately the second toe is healed the first toe on the left foot is still open but does not appear to be doing nearly as poorly at this time. I do think we can perform a little bit of debridement in order to clear away some of the necrotic debris patient is in agreement with that plan. 05-29-2021 upon evaluation today patient appears to be doing worse currently in regard to the pain experience. Her leg is still very red in fact I think is a little bit worse than last week unfortunately. Last week I was even concerned about a little bit of cellulitis she really felt like it was more related to her swelling but again I am  not certain that that is necessarily the case. Fortunately I do not see any signs of active infection at this time systemically though locally I am definitely concerned in this regard. 06-05-2021 upon evaluation today patient appears to be doing well with regard to her toe ulcer this is worse is also not better though. Unfortunately it is hurting more in her leg in general is also bothering her. This is despite being. She is also tolerating Levaquin with making her dizzy. Nonetheless I am not certain its helping after she has been on this week I am not seeing much of the improvement with regard to the cellulitis in her leg. She is also previously been on doxycycline although that was towards the end of February. 06-12-2021 upon evaluation today patient appears to be doing well with regard to her wound. We are definitely seeing signs of improvement with regard to size. With that being said I do not see any evidence of active infection at this time which is great news and very pleased in that regard. She is going require some debridement both for callus as well as clearing away some of the slough and biofilm. 07-03-2021 upon evaluation today patient appears to be doing well with regard to his wound on the foot. With that being said he unfortunately did have some of the padding along the heel that got pushed down and subsequently he tells me that it was feeling like it was bunched up under his foot and the heel location and bothering him over the past couple of days. With that being said that when we remove the cast today unbeknownst to [facility] the padding had been pushed down and the cast saw actually nicked his heel this looks like a very light abrasion. Nonetheless he does not have any pain  and there is no active bleeding at this time. I actually did take a picture to show him as well and we documented it we will see how this looks obviously next week but I think this will be healed by Wednesday. The  wound itself looks to be doing much better there is some callus to remove some slough and biofilm as well. 07-10-2021 upon evaluation today patient appears to be doing well with regard to her wounds. She has been tolerating the dressing changes without complication. Actually I feel like the foot is looking a little better in regard to the great toe. Everything else is doing better as well. I feel like she has been much more careful with that and from the callus standpoint things are greatly improved it also helps that she shows up on a regular basis. With that being said we are going to be seeing her following her MRI that scheduled for next week. 07-24-2021 upon evaluation today patient appears to be doing well with regard to her wound. She actually is shown signs of improvement there are still some callus not nearly as bad as what it is then I think we need to try to see what we can do to get some of this off today but hopefully will continue to show signs of improvement with the current regimen. She has thought about the hyperbarics she tells me there is just really not can be a way for her to get here regularly for that. 07-31-2021 upon evaluation today patient appears to be doing well with regard to her toe ulcer although its not significantly smaller its not worse either. She still has a lot of callus buildup. With that being said I think we need to do something to try to keep pressure off of the toe. I think a bolster underneath the big toe could be beneficial in this regard. 08-14-2021 upon evaluation today patient's wound actually showed signs of some improvement she still had callus but for 2 weeks this was not bad at all compared to what we normally find. I do think we are on the right track here and the good news is she seems to be making some excellent progress in my opinion. 08-21-2021 upon evaluation today patient appears to be doing well with regard to her toe ulcer. Unfortunately her  silicone toe spacer was too tight Carte, Cheryl A. ([id number]) for her toe by the time she got home and was already causing her pain. For that reason she has not been wearing that she is putting the rolled up gauze up underneath. Obviously I think this is still a good way to go and better than nothing the biggest thing is that she still is walking without shoes at home I discussed this with her previously but again I have that discussion again today I want her to have shoes on at all times she has been going around just in her "sock feet". 10-02-2021 upon evaluation today patient appears to be doing poorly in regard to her toe. She is actually been in sinus June 30 when I last saw her. She has had a lot of issues with her kidneys and then she also had her little puppy passed away she tells me. Unfortunately I think this has weighed heavily on her. Fortunately there does not appear to be any evidence of active infection at this time which is great news. No fevers, chills, nausea, vomiting, or diarrhea. 10-09-2021 upon evaluation today patient appears to be doing  well currently in regard to her wound. She has been tolerating the dressing changes without complication. Fortunately there does not appear to be any signs of active infection locally or systemically at this time which is great news. No fevers, chills, nausea, vomiting, or diarrhea. Electronic Signature(s) Signed: 10/09/2021 10:04:02 AM By: Worthy Keeler PA-C Entered By: Worthy Keeler on 10/09/2021 10:04:02 Asberry, [name] ([id number]) -------------------------------------------------------------------------------- Physical Exam Details Patient Name: [name] Date of Service: 10/09/2021 9:00 AM Medical Record Number: [id number] Patient Account Number: [id number] Date of Birth/Sex: 1941-02-13 (81 y.o. F) Treating RN: Cornell Barman Primary Care Provider: Claris Gower Other Clinician: Massie Kluver Referring Provider: Claris Gower Treating Provider/Extender: Skipper Cliche in Treatment: 48 Constitutional Well-nourished and well-hydrated in no acute distress. Respiratory normal breathing without difficulty. Psychiatric this patient is able to make decisions and demonstrates good insight into disease process. Alert and Oriented x 3. pleasant and cooperative. Notes Upon inspection patient's wound bed actually showed signs of good granulation and epithelization at this point. Fortunately I do not see any evidence of active infection which is great news and overall I think that the patient is making good progress here. I did perform debridement of clearway some of the necrotic debris she seems to be significantly improved compared to previous. Electronic Signature(s) Signed: 10/09/2021 10:04:32 AM By: Worthy Keeler PA-C Entered By: Worthy Keeler on 10/09/2021 10:04:32 [name], [name] ([id number]) -------------------------------------------------------------------------------- Physician Orders Details Patient Name: [name] Date of Service: 10/09/2021 9:00 AM Medical Record Number: [id number] Patient Account Number: [id number] Date of Birth/Sex: Feb 11, 1941 (81 y.o. 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 o Nurse Visit as needed [employer] o Wash wounds with antibacterial soap and water. o May shower; gently cleanse wound with  antibacterial soap, rinse and pat dry prior to dressing wounds o No tub bath. Anesthetic (Use 'Patient Medications' Section for Anesthetic Order Entry) o Lidocaine applied to wound bed Edema Control - Lymphedema / Segmental Compressive Device / Other o Elevate, Exercise Daily and Avoid Standing for Long Periods of Time. o Elevate legs to the level of the heart and pump ankles as often as possible o Elevate leg(s) parallel to the floor when sitting. o DO YOUR BEST to sleep in the bed at night. DO NOT sleep in your recliner. Long hours of sitting in a recliner leads to swelling of the legs and/or potential wounds on your backside. Wound Treatment Wound #1 - Toe Great Wound Laterality: Left Cleanser: [place] Ancillary Kit - 15 Day Supply (Generic) Every Other Day/30 Days Discharge Instructions: Use supplies as instructed; Kit contains: (15) Saline Bullets; (15) 3x3 Gauze; 15 pr Gloves Cleanser: Normal Saline Every Other Day/30 Days Discharge Instructions: Wash your hands with soap and water. Remove old dressing, discard into plastic bag and place into trash. Cleanse the wound with Normal Saline prior to applying a clean dressing using gauze sponges, not tissues or cotton balls. Do not scrub or use excessive force. Pat dry using gauze sponges, not tissue or cotton balls.  Primary Dressing: Hydrofera Blue Ready Transfer Foam, 2.5x2.5 (in/in) (Generic) Every Other Day/30 Days Discharge Instructions: Apply Hydrofera Blue Ready to wound bed as directed Secondary Dressing: Coverlet Latex-Free Fabric Adhesive Dressings Every Other Day/30 Days Discharge Instructions: 1.5 x 2 Add-Ons: Gauze Non-Bordered 4x4 (in/in) Every Other Day/30 Days Discharge Instructions: Cover with dry gauze, rolled up and placed under left great toe Electronic Signature(s) Signed: 10/09/2021 12:34:53 PM By: Worthy Keeler PA-C Signed: 10/09/2021 1:18:57 PM By: Massie Kluver Entered By: Massie Kluver on 10/09/2021  09:49:43 [name], [name] ([id number]) [name], [name] ([id number]) -------------------------------------------------------------------------------- Problem List Details Patient Name: [name] Date of Service: 10/09/2021 9:00 AM Medical Record Number: [id number] Patient Account Number: [id number] Date of Birth/Sex: 1941/02/02 (81 y.o. 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 04/17/2021 No Yes Z89.421 Acquired absence of other right toe(s) 04/17/2021 No Yes I10 Essential (primary) hypertension 04/17/2021 No Yes Inactive Problems Resolved Problems Electronic Signature(s) Signed: 10/09/2021 9:18:53 AM By: Worthy Keeler PA-C Entered By: Worthy Keeler on 10/09/2021 09:18:53 Grigorian, [name] ([id number]) -------------------------------------------------------------------------------- Progress Note Details Patient Name: [name] Date of Service: 10/09/2021 9:00 AM Medical Record Number: [id number] Patient Account Number: [id number] Date of Birth/Sex: [birth year] (81 y.o. F) Treating RN: Cornell Barman Primary Care Provider: Claris Gower Other Clinician: Massie Kluver Referring Provider: Claris Gower Treating Provider/Extender: Skipper Cliche in Treatment: 25 Subjective Chief Complaint Information obtained from Patient Multiple toe ulcersations History of Present Illness (HPI) 04/17/2021 upon evaluation patient appears to be doing somewhat poorly currently in regard to her first and second toe of the left foot. She previously has  been seen by Dr. Doran Durand she saw him on 31 January he did not feel there was any evidence of osteomyelitis. He did give her a thorough evaluation including x-rays and showed no abnormal findings according to notes. With that being said he felt like that wound care would be beneficial therefore she contacted [place]. She has currently been using antibiotic ointment and has noted that this wound on the great toe has been there for about a year. She is not certain whether there is anything on the second toe or not she has not really noted any drainage but at the same time she cannot be sure there is nothing hiding up underneath a significant callus here. Her feet really do not have any major complications with regard to pain and that is good news. She does have neuropathy. Patient does have a history of diabetes mellitus type 2, hypertension, and her most recent hemoglobin A1c was 7.6 on June 2022. 05/08/2021 upon evaluation today patient appears to be doing poorly in regard to her toes unfortunately she has a lot of callus buildup. Its been almost a month since I last saw her. Obviously in this amount of time she has built up quite a bit of callus she was sick that is the reason she did not make it in. Nonetheless I do think that weekly visits is probably can be the best thing is asking to help to allow these areas to heal much more effectively and quickly. Fortunately I do not see  any evidence of active infection locally or systemically which is great news. 05/15/2021 upon evaluation today patient appears to be doing well currently in regard to her wound. Overall I think that we are definitely headed in the right direction with regard to the toes. Fortunately I do not see any signs of active infection locally or systemically at this time which is great news. No fevers, chills, nausea, vomiting, or diarrhea. 05/22/2021 upon evaluation today patient appears to be doing well with regard to her wound. She has been  tolerating the dressing changes without complication. Fortunately the second toe is healed the first toe on the left foot is still open but does not appear to be doing nearly as poorly at this time. I do think we can perform a little bit of debridement in order to clear away some of the necrotic debris patient is in agreement with that plan. 05-29-2021 upon evaluation today patient appears to be doing worse currently in regard to the pain experience. Her leg is still very red in fact I think is a little bit worse than last week unfortunately. Last week I was even concerned about a little bit of cellulitis she really felt like it was more related to her swelling but again I am not certain that that is necessarily the case. Fortunately I do not see any signs of active infection at this time systemically though locally I am definitely concerned in this regard. 06-05-2021 upon evaluation today patient appears to be doing well with regard to her toe ulcer this is worse is also not better though. Unfortunately it is hurting more in her leg in general is also bothering her. This is despite being. She is also tolerating Levaquin with making her dizzy. Nonetheless I am not certain its helping after she has been on this week I am not seeing much of the improvement with regard to the cellulitis in her leg. She is also previously been on doxycycline although that was towards the end of February. 06-12-2021 upon evaluation today patient appears to be doing well with regard to her wound. We are definitely seeing signs of improvement with regard to size. With that being said I do not see any evidence of active infection at this time which is great news and very pleased in that regard. She is going require some debridement both for callus as well as clearing away some of the slough and biofilm. 07-03-2021 upon evaluation today patient appears to be doing well with regard to his wound on the foot. With that being said he  unfortunately did have some of the padding along the heel that got pushed down and subsequently he tells me that it was feeling like it was bunched up under his foot and the heel location and bothering him over the past couple of days. With that being said that when we remove the cast today unbeknownst to [facility] the padding had been pushed down and the cast saw actually nicked his heel this looks like a very light abrasion. Nonetheless he does not have any pain and there is no active bleeding at this time. I actually did take a picture to show him as well and we documented it we will see how this looks obviously next week but I think this will be healed by Wednesday. The wound itself looks to be doing much better there is some callus to remove some slough and biofilm as well. 07-10-2021 upon evaluation today patient appears to be doing well with  regard to her wounds. She has been tolerating the dressing changes without complication. Actually I feel like the foot is looking a little better in regard to the great toe. Everything else is doing better as well. I feel like she has been much more careful with that and from the callus standpoint things are greatly improved it also helps that she shows up on a regular basis. With that being said we are going to be seeing her following her MRI that scheduled for next week. 07-24-2021 upon evaluation today patient appears to be doing well with regard to her wound. She actually is shown signs of improvement there are still some callus not nearly as bad as what it is then I think we need to try to see what we can do to get some of this off today but hopefully will continue to show signs of improvement with the current regimen. She has thought about the hyperbarics she tells me there is just really not can be a way for her to get here regularly for that. 07-31-2021 upon evaluation today patient appears to be doing well with regard to her toe ulcer although its not  significantly smaller its not worse either. She still has a lot of callus buildup. With that being said I think we need to do something to try to keep pressure off of the toe. I think a bolster underneath the big toe could be beneficial in this regard. [name], [name] ([id number]) 08-14-2021 upon evaluation today patient's wound actually showed signs of some improvement she still had callus but for 2 weeks this was not bad at all compared to what we normally find. I do think we are on the right track here and the good news is she seems to be making some excellent progress in my opinion. 08-21-2021 upon evaluation today patient appears to be doing well with regard to her toe ulcer. Unfortunately her silicone toe spacer was too tight for her toe by the time she got home and was already causing her pain. For that reason she has not been wearing that she is putting the rolled up gauze up underneath. Obviously I think this is still a good way to go and better than nothing the biggest thing is that she still is walking without shoes at home I discussed this with her previously but again I have that discussion again today I want her to have shoes on at all times she has been going around just in her "sock feet". 10-02-2021 upon evaluation today patient appears to be doing poorly in regard to her toe. She is actually been in sinus June 30 when I last saw her. She has had a lot of issues with her kidneys and then she also had her little puppy passed away she tells me. Unfortunately I think this has weighed heavily on her. Fortunately there does not appear to be any evidence of active infection at this time which is great news. No fevers, chills, nausea, vomiting, or diarrhea. 10-09-2021 upon evaluation today patient appears to be doing well currently in regard to her wound. She has been tolerating the dressing changes without complication. Fortunately there does not appear to be any signs of active infection  locally or systemically at this time which is great news. No fevers, chills, nausea, vomiting, or diarrhea. Objective Constitutional Well-nourished and well-hydrated in no acute distress. Vitals Time Taken: 9:28 AM, Height: 61 in, Weight: 250 lbs, BMI: 47.2, Temperature: 97.9 F, Pulse: 76 bpm, Respiratory  Rate: 18 breaths/min, Blood Pressure: 158/73 mmHg. Respiratory normal breathing without difficulty. Psychiatric this patient is able to make decisions and demonstrates good insight into disease process. Alert and Oriented x 3. pleasant and cooperative. General Notes: Upon inspection patient's wound bed actually showed signs of good granulation and epithelization at this point. Fortunately I do not see any evidence of active infection which is great news and overall I think that the patient is making good progress here. I did perform debridement of clearway some of the necrotic debris she seems to be significantly improved compared to previous. Integumentary (Hair, Skin) Wound #1 status is Open. Original cause of wound was Gradually Appeared. The date acquired was: 02/23/2020. The wound has been in treatment 25 weeks. The wound is located on the Left Toe Great. The wound measures 0.2cm length x 0.4cm width x 0.2cm depth; 0.063cm^2 area and 0.013cm^3 volume. There is Fat Layer (Subcutaneous Tissue) exposed. There is no tunneling or undermining noted. There is a medium amount of serosanguineous drainage noted. The wound margin is thickened. There is medium (34-66%) pink granulation within the wound bed. There is a medium (34-66%) amount of necrotic tissue within the wound bed including Adherent Slough. 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  ([id number]) Wound #1 Pre-procedure diagnosis of Wound #1 is a Diabetic Wound/Ulcer of the Lower Extremity located on the Left Toe Great .Severity of Tissue Pre Debridement is: Fat layer exposed. There was a Excisional Skin/Subcutaneous Tissue Debridement with a total area of 0.4 sq cm performed by Tommie Sams., PA-C. With the following instrument(s): Curette to remove Viable and Non-Viable tissue/material. Material removed includes Callus, Subcutaneous Tissue, and Slough. A time out was conducted at 09:46, prior to the start of the procedure. A Minimum amount of bleeding was controlled with Pressure. The procedure was tolerated well. Post Debridement Measurements: 0.3cm length x 0.6cm width x 0.2cm depth; 0.028cm^3 volume. Character of Wound/Ulcer Post Debridement is stable. Severity of Tissue Post Debridement is: Fat layer exposed. Post procedure Diagnosis Wound #1: Same as Pre-Procedure Plan Follow-up Appointments: Return Appointment in 1 week. Nurse Visit as needed Bathing/ Shower/ Hygiene: Wash wounds with antibacterial soap and water. May shower; gently cleanse wound with antibacterial soap, rinse and pat dry prior to dressing wounds No tub bath. Anesthetic (Use 'Patient Medications' Section for Anesthetic Order Entry): Lidocaine applied to wound bed Edema Control - Lymphedema / Segmental Compressive Device / Other: Elevate, Exercise Daily and Avoid Standing for Long Periods of Time. Elevate legs to the level of the heart and pump ankles as often as possible Elevate leg(s) parallel to the floor when sitting. DO YOUR BEST to sleep in the bed at night. DO NOT sleep in your recliner. Long hours of sitting in a recliner leads to swelling of the legs and/or potential wounds on your backside. WOUND #1: - Toe Great Wound Laterality: Left Cleanser: Byram Ancillary Kit - 15 Day Supply (Generic) Every Other Day/30 Days Discharge Instructions: Use supplies as instructed; Kit contains: (15)  Saline Bullets; (15) 3x3 Gauze; 15 pr Gloves Cleanser: Normal Saline Every Other Day/30 Days Discharge Instructions: Wash your hands with soap and water. Remove old dressing, discard into plastic bag and place into trash. Cleanse the wound with Normal Saline prior to applying a clean dressing using gauze  sponges, not tissues or cotton balls. Do not scrub or use excessive force. Pat dry using gauze sponges, not tissue or cotton balls. Primary Dressing: Hydrofera Blue Ready Transfer Foam, 2.5x2.5 (in/in) (Generic) Every Other Day/30 Days Discharge Instructions: Apply Hydrofera Blue Ready to wound bed as directed Secondary Dressing: Coverlet Latex-Free Fabric Adhesive Dressings Every Other Day/30 Days Discharge Instructions: 1.5 x 2 Add-Ons: Gauze Non-Bordered 4x4 (in/in) Every Other Day/30 Days Discharge Instructions: Cover with dry gauze, rolled up and placed under left great toe 1. I would recommend currently that we go ahead and continue with the wound care measures as before and the patient is in agreement with the plan. This includes the use of the [facility] which I think is doing quite well. 2. We will continue with a coverlet to secure in place. 3. She will continue with the 3 gauze folded in half and rolled up to make a bolster underneath the toe this does seem to be helping to protect the area. We will see patient back for reevaluation in 1 week here in the clinic. If anything worsens or changes patient will contact our office for additional recommendations. Electronic Signature(s) Signed: 10/09/2021 10:05:06 AM By: Worthy Keeler PA-C Entered By: Worthy Keeler on 10/09/2021 10:05:06 Briguglio, [name] ([id number]) -------------------------------------------------------------------------------- SuperBill Details Patient Name: [name] Date of Service: 10/09/2021 Medical Record Number: [id number] Patient Account Number: [id number] Date of Birth/Sex: 1940-12-03 (81 y.o.  F) Treating RN: Cornell Barman Primary Care Provider: Claris Gower Other Clinician: Massie Kluver Referring Provider: Claris Gower Treating Provider/Extender: Skipper Cliche in Treatment: 25 Diagnosis Coding ICD-10 Codes Code Description E11.621 Type 2 diabetes mellitus with foot ulcer L97.522 Non-pressure chronic ulcer of other part of left foot with fat layer exposed Z89.411 Acquired absence of right great toe Z89.421 Acquired absence of other right toe(s) I10 Essential (primary) hypertension Facility Procedures CPT4 Code: 46568127 Description: 51700 - DEB SUBQ TISSUE 20 SQ CM/< Modifier: Quantity: 1 CPT4 Code: Description: ICD-10 Diagnosis Description L97.522 Non-pressure chronic ulcer of other part of left foot with fat layer exp Modifier: osed Quantity: Physician Procedures CPT4 Code: 1749449 Description: 11042 - WC PHYS SUBQ TISS 20 SQ CM Modifier: Quantity: 1 CPT4 Code: Description: ICD-10 Diagnosis Description L97.522 Non-pressure chronic ulcer of other part of left foot with fat layer exp Modifier: osed Quantity: Electronic Signature(s) Signed: 10/09/2021 10:05:39 AM By: Worthy Keeler PA-C Entered By: Worthy Keeler on 10/09/2021 10:05:38

## 2021-10-10 NOTE — Progress Notes (Addendum)
Baugh, [name] ([id number]) Visit Report for 10/09/2021 Arrival Information Details Patient Name: SEARA, HINESLEY Date of Service: 10/09/2021 9:00 AM Medical Record Number: [id number] Patient Account Number: [id number] Date of Birth/Sex: 01-25-1941 (81 y.o. F) Treating RN: Cornell Barman Primary Care Zacary Bauer: Claris Gower Other Clinician: Massie Kluver Referring Quinnley Colasurdo: Claris Gower Treating Quetzali Heinle/Extender: Skipper Cliche in Treatment: 25 Visit Information History Since Last Visit All ordered tests and consults were completed: No Patient Arrived: [name] Added or deleted any medications: No Arrival Time: 09:27 Any new allergies or adverse reactions: No Transfer Assistance: None Had a fall or experienced change in No Patient Requires Transmission-Based Precautions: No activities of daily living that may affect Patient Has Alerts: No risk of falls: Hospitalized since last visit: No Pain Present Now: No Electronic Signature(s) Signed: 10/09/2021 12:34:53 PM By: Worthy Keeler PA-C Entered By: Worthy Keeler on 10/09/2021 09:27:58 Ackroyd, [name] ([id number]) -------------------------------------------------------------------------------- Clinic Level of Care Assessment Details Patient Name: [name] Date of Service: 10/09/2021 9:00 AM Medical Record Number: [id number] Patient Account Number: [id number] Date of Birth/Sex: January 14, 1941 (81 y.o. F) Treating RN: Cornell Barman Primary Care Desmond Szabo: Claris Gower Other Clinician: Massie Kluver Referring Nirvaan Frett: Claris Gower Treating Aubreyanna Dorrough/Extender: Skipper Cliche in Treatment: 25 Clinic Level of Care Assessment Items TOOL 1 Quantity Score '[]'  - Use when EandM and Procedure is performed on INITIAL visit 0 ASSESSMENTS - Nursing Assessment / Reassessment '[]'  - General Physical Exam (combine w/ comprehensive assessment (listed just below) when performed on new 0 pt. evals) '[]'  - 0 Comprehensive Assessment (HX,  ROS, Risk Assessments, Wounds Hx, etc.) ASSESSMENTS - Wound and Skin Assessment / Reassessment '[]'  - Dermatologic / Skin Assessment (not related to wound area) 0 ASSESSMENTS - Ostomy and/or Continence Assessment and Care '[]'  - Incontinence Assessment and Management 0 '[]'  - 0 Ostomy Care Assessment and Management (repouching, etc.) PROCESS - Coordination of Care '[]'  - Simple Patient / Family Education for ongoing care 0 '[]'  - 0 Complex (extensive) Patient / Family Education for ongoing care '[]'  - 0 Staff obtains Programmer, systems, Records, Test Results / Process Orders '[]'  - 0 Staff telephones HHA, Nursing Homes / Clarify orders / etc '[]'  - 0 Routine Transfer to another Facility (non-emergent condition) '[]'  - 0 Routine Hospital Admission (non-emergent condition) '[]'  - 0 New Admissions / Biomedical engineer / Ordering NPWT, Apligraf, etc. '[]'  - 0 Emergency Hospital Admission (emergent condition) PROCESS - Special Needs '[]'  - Pediatric / Minor Patient Management 0 '[]'  - 0 Isolation Patient Management '[]'  - 0 Hearing / Language / Visual special needs '[]'  - 0 Assessment of Community assistance (transportation, D/C planning, etc.) '[]'  - 0 Additional assistance / Altered mentation '[]'  - 0 Support Surface(s) Assessment (bed, cushion, seat, etc.) INTERVENTIONS - Miscellaneous '[]'  - External ear exam 0 '[]'  - 0 Patient Transfer (multiple staff / Civil Service fast streamer / Similar devices) '[]'  - 0 Simple Staple / Suture removal (25 or less) '[]'  - 0 Complex Staple / Suture removal (26 or more) '[]'  - 0 Hypo/Hyperglycemic Management (do not check if billed separately) '[]'  - 0 Ankle / Brachial Index (ABI) - do not check if billed separately Has the patient been seen at the hospital within the last three years: Yes Total Score: 0 Level Of Care: ____ [name] ([id number]) Electronic Signature(s) Signed: 10/09/2021 1:18:57 PM By: Massie Kluver Entered By: Massie Kluver on 10/09/2021 09:49:49 Ackman, [name]  ([id number]) -------------------------------------------------------------------------------- Encounter Discharge Information Details Patient Name: [name] Date of Service: 10/09/2021  9:00 AM Medical Record Number: [id number] Patient Account Number: [id number] Date of Birth/Sex: 17-Apr-1940 (81 y.o. F) Treating RN: Cornell Barman Primary Care Nathin Saran: Claris Gower Other Clinician: Massie Kluver Referring Annmarie Plemmons: Claris Gower Treating Alexande Sheerin/Extender: Skipper Cliche in Treatment: 25 Encounter Discharge Information Items Post Procedure Vitals Discharge Condition: Stable Temperature (F): 97.9 Ambulatory Status: Cane Pulse (bpm): 76 Discharge Destination: Home Respiratory Rate (breaths/min): 18 Transportation: Private Auto Blood Pressure (mmHg): 158/73 Accompanied By: self Schedule Follow-up Appointment: Yes Clinical Summary of Care: Electronic Signature(s) Signed: 10/09/2021 1:18:57 PM By: Massie Kluver Entered By: Massie Kluver on 10/09/2021 10:53:17 Kugler, [name] ([id number]) -------------------------------------------------------------------------------- Lower Extremity Assessment Details Patient Name: [name] Date of Service: 10/09/2021 9:00 AM Medical Record Number: [id number] Patient Account Number: [id number] Date of Birth/Sex: November 16, 1940 (81 y.o. F) Treating RN: Cornell Barman Primary Care Jalene Demo: Claris Gower Other Clinician: Massie Kluver Referring Subrena Devereux: Claris Gower Treating [place] Warriner/Extender: Jeri Cos Weeks in Treatment: 25 Edema Assessment Assessed: Shirlyn Goltz: Yes] Patrice Paradise: No] Edema: [Left: Ye] [Right: s] Calf Left: Right: Point of Measurement: 39 cm From Medial Instep 40.4 cm Ankle Left: Right: Point of Measurement: 10 cm From Medial Instep 26 cm Vascular Assessment Pulses: Dorsalis Pedis Palpable: [Left:Yes] Posterior Tibial Palpable: [Left:Yes] Electronic Signature(s) Signed: 10/09/2021 10:15:18 AM By: Gretta Cool, BSN, RN,  CWS, [name] RN, BSN Signed: 10/09/2021 12:34:53 PM By: Worthy Keeler PA-C Entered By: Worthy Keeler on 10/09/2021 09:38:10 Rounds, [name] ([id number]) -------------------------------------------------------------------------------- Multi Wound Chart Details Patient Name: [name] Date of Service: 10/09/2021 9:00 AM Medical Record Number: [id number] Patient Account Number: [id number] Date of Birth/Sex: 14-May-1940 (81 y.o. F) Treating RN: Cornell Barman Primary Care Meron Bocchino: Claris Gower Other Clinician: Massie Kluver Referring Yeng Perz: Claris Gower Treating Julieann Drummonds/Extender: Skipper Cliche in Treatment: 25 Vital Signs Height(in): 61 Pulse(bpm): 29 Weight(lbs): 250 Blood Pressure(mmHg): 158/73 Body Mass Index(BMI): 47.2 Temperature(F): 97.9 Respiratory Rate(breaths/min): 18 Photos: [N/A:N/A] Wound Location: Left Toe Great N/A N/A Wounding Event: Gradually Appeared N/A N/A Primary Etiology: Diabetic Wound/Ulcer of the Lower N/A N/A Extremity Comorbid History: Chronic sinus problems/congestion, N/A N/A Anemia, Sleep Apnea, Congestive Heart Failure, Hypertension, Type II Diabetes, Neuropathy Date Acquired: 02/23/2020 N/A N/A Weeks of Treatment: 25 N/A N/A Wound Status: Open N/A N/A Wound Recurrence: No N/A N/A Measurements L x W x D (cm) 0.2x0.4x0.2 N/A N/A Area (cm) : 0.063 N/A N/A Volume (cm) : 0.013 N/A N/A % Reduction in Area: 98.70% N/A N/A % Reduction in Volume: 97.20% N/A N/A Classification: Grade 1 N/A N/A Exudate Amount: Medium N/A N/A Exudate Type: Serosanguineous N/A N/A Exudate Color: red, brown N/A N/A Wound Margin: Thickened N/A N/A Granulation Amount: Medium (34-66%) N/A N/A Granulation Quality: Pink N/A N/A Necrotic Amount: Medium (34-66%) N/A N/A Exposed Structures: Fat Layer (Subcutaneous Tissue): N/A N/A Yes Epithelialization: Small (1-33%) N/A N/A Treatment Notes Electronic Signature(s) Signed: 10/09/2021 12:34:53 PM By: Worthy Keeler PA-C Entered By: Worthy Keeler on 10/09/2021 09:38:22 Altizer, [name] ([id number]) -------------------------------------------------------------------------------- Multi-Disciplinary Care Plan Details Patient Name: [name] Date of Service: 10/09/2021 9:00 AM Medical Record Number: [id number] Patient Account Number: [id number] Date of Birth/Sex: 1940-05-31 (81 y.o. F) Treating RN: Cornell Barman Primary Care Garold Sheeler: Claris Gower Other Clinician: Massie Kluver Referring Katherine Syme: Claris Gower Treating Laurice Kimmons/Extender: Skipper Cliche in Treatment: 25 Active Inactive Wound/Skin Impairment Nursing Diagnoses: Impaired tissue integrity Knowledge deficit related to ulceration/compromised skin integrity Goals: Ulcer/skin breakdown will have a volume reduction of 30% by week 4 Date Initiated: 04/17/2021 Date Inactivated: 07/03/2021 Target Resolution Date:  05/15/2021 Goal Status: Unmet Unmet Reason: comorbities Ulcer/skin breakdown will have a volume reduction of 50% by week 8 Date Initiated: 04/17/2021 Date Inactivated: 07/03/2021 Target Resolution Date: 06/12/2021 Goal Status: Met Ulcer/skin breakdown will have a volume reduction of 80% by week 12 Date Initiated: 04/17/2021 Target Resolution Date: 07/10/2021 Goal Status: Active Ulcer/skin breakdown will heal within 14 weeks Date Initiated: 04/17/2021 Target Resolution Date: 07/24/2021 Goal Status: Active Interventions: Assess patient/caregiver ability to obtain necessary supplies Assess patient/caregiver ability to perform ulcer/skin care regimen upon admission and as needed Assess ulceration(s) every visit Provide education on ulcer and skin care Notes: Electronic Signature(s) Signed: 10/09/2021 10:15:18 AM By: Gretta Cool, BSN, RN, CWS, [name] RN, BSN Signed: 10/09/2021 12:34:53 PM By: Worthy Keeler PA-C Entered By: Worthy Keeler on 10/09/2021 09:38:15 Aranas, [name]  ([id number]) -------------------------------------------------------------------------------- Pain Assessment Details Patient Name: [name] Date of Service: 10/09/2021 9:00 AM Medical Record Number: [id number] Patient Account Number: [id number] Date of Birth/Sex: 06/12/1940 (81 y.o. F) Treating RN: Cornell Barman Primary Care Arloa Prak: Claris Gower Other Clinician: Massie Kluver Referring Maylani Embree: Claris Gower Treating Takeshia Wenk/Extender: Skipper Cliche in Treatment: 25 Active Problems Location of Pain Severity and Description of Pain Patient Has Paino No Site Locations Pain Management and Medication Current Pain Management: Electronic Signature(s) Signed: 10/09/2021 10:15:18 AM By: Gretta Cool, BSN, RN, CWS, [name] RN, BSN Signed: 10/09/2021 12:34:53 PM By: Worthy Keeler PA-C Entered By: Worthy Keeler on 10/09/2021 09:30:24 Rengel, [name] ([id number]) -------------------------------------------------------------------------------- Wound Assessment Details Patient Name: [name] Date of Service: 10/09/2021 9:00 AM Medical Record Number: [id number] Patient Account Number: [id number] Date of Birth/Sex: 1941-01-08 (81 y.o. F) Treating RN: Cornell Barman Primary Care Dyer Klug: Claris Gower Other Clinician: Massie Kluver Referring Pricella Gaugh: Claris Gower Treating Madissen Wyse/Extender: Skipper Cliche in Treatment: 25 Wound Status Wound Number: 1 Primary Diabetic Wound/Ulcer of the Lower Extremity Etiology: Wound Location: Left Toe Great Wound Open Wounding Event: Gradually Appeared Status: Date Acquired: 02/23/2020 Comorbid Chronic sinus problems/congestion, Anemia, Sleep Apnea, Weeks Of Treatment: 25 History: Congestive Heart Failure, Hypertension, Type II Diabetes, Clustered Wound: No Neuropathy Photos Wound Measurements Length: (cm) 0.2 Width: (cm) 0.4 Depth: (cm) 0.2 Area: (cm) 0.063 Volume: (cm) 0.013 % Reduction in Area: 98.7% % Reduction in Volume:  97.2% Epithelialization: Small (1-33%) Tunneling: No Undermining: No Wound Description Classification: Grade 1 Wound Margin: Thickened Exudate Amount: Medium Exudate Type: Serosanguineous Exudate Color: red, brown Foul Odor After Cleansing: No Slough/Fibrino Yes Wound Bed Granulation Amount: Medium (34-66%) Exposed Structure Granulation Quality: Pink Fat Layer (Subcutaneous Tissue) Exposed: Yes Necrotic Amount: Medium (34-66%) Necrotic Quality: Adherent Slough Treatment Notes Wound #1 (Toe Great) Wound Laterality: Left Cleanser Byram Ancillary Kit - 15 Day Supply Discharge Instruction: Use supplies as instructed; Kit contains: (15) Saline Bullets; (15) 3x3 Gauze; 15 pr Gloves Normal Saline Discharge Instruction: Wash your hands with soap and water. Remove old dressing, discard into plastic bag and place into trash. Cleanse the wound with Normal Saline prior to applying a clean dressing using gauze sponges, not tissues or cotton balls. Do not scrub or use excessive force. Pat dry using gauze sponges, not tissue or cotton balls. Sherk, [name] ([id number]) Peri-Wound Care Topical Primary Dressing Hydrofera Blue Ready Transfer Foam, 2.5x2.5 (in/in) Discharge Instruction: Apply Hydrofera Blue Ready to wound bed as directed Secondary Dressing Coverlet Latex-Free Fabric Adhesive Dressings Discharge Instruction: 1.5 x 2 Secured With Compression Wrap Compression Stockings Add-Ons Gauze Non-Bordered 4x4 (in/in) Discharge Instruction: Cover with dry gauze, rolled up and placed under left  great toe Electronic Signature(s) Signed: 10/09/2021 10:15:18 AM By: Gretta Cool, BSN, RN, CWS, [name] RN, BSN Signed: 10/09/2021 12:34:53 PM By: Worthy Keeler PA-C Entered By: Worthy Keeler on 10/09/2021 09:36:36 Compere, [name] ([id number]) -------------------------------------------------------------------------------- Vitals Details Patient Name: [name] Date of Service: 10/09/2021 9:00  AM Medical Record Number: [id number] Patient Account Number: [id number] Date of Birth/Sex: 1940-06-14 (81 y.o. F) Treating RN: Cornell Barman Primary Care Jairy Angulo: Claris Gower Other Clinician: Massie Kluver Referring Nalini Alcaraz: Claris Gower Treating Kesha Hurrell/Extender: Skipper Cliche in Treatment: 25 Vital Signs Time Taken: 09:28 Temperature (F): 97.9 Height (in): 61 Pulse (bpm): 76 Weight (lbs): 250 Respiratory Rate (breaths/min): 18 Body Mass Index (BMI): 47.2 Blood Pressure (mmHg): 158/73 Reference Range: 80 - 120 mg / dl Electronic Signature(s) Signed: 10/09/2021 12:34:53 PM By: Worthy Keeler PA-C Entered By: Worthy Keeler on 10/09/2021 09:30:18

## 2021-10-16 ENCOUNTER — Encounter: Payer: Medicare Other | Admitting: Physician Assistant

## 2021-10-16 DIAGNOSIS — E11621 Type 2 diabetes mellitus with foot ulcer: Secondary | ICD-10-CM | POA: Diagnosis not present

## 2021-10-16 NOTE — Progress Notes (Signed)
[name], [name] ([id number]) Visit Report for 10/16/2021 Chief Complaint Document Details Patient Name: [name] Date of Service: 10/16/2021 9:00 AM Medical Record Number: [id number] Patient Account Number: [id number] Date of Birth/Sex: [birth year] (81 y.o. F) Treating RN: [name] Primary Care Provider: Claris [name] Other Clinician: Massie [name] Referring Provider: Claris [name] Treating Provider/Extender: [name] in Treatment: 26 Information Obtained from: Patient Chief Complaint Multiple toe ulcersations Electronic Signature(s) Signed: 10/16/2021 9:56:45 AM By: Cheryl Keeler PA-C Entered By: [name] on 10/16/2021 09:56:45 [name] ([id number]) -------------------------------------------------------------------------------- Debridement Details Patient Name: [name] Date of Service: 10/16/2021 9:00 AM Medical Record Number: [id number] Patient Account Number: [id number] Date of Birth/Sex: 06-03-1940 (81 y.o. F) Treating RN: [name] Primary Care Provider: Claris [name] Other Clinician: Massie [name] Referring Provider: Claris [name] Treating Provider/Extender: [name] in Treatment: 26 Debridement Performed for Wound #1 Left Toe Great Assessment: Performed By: Physician Cheryl Sams., PA-C Debridement Type: Debridement Severity of Tissue Pre Debridement: Fat layer exposed Level of Consciousness (Pre- Awake and Alert procedure): Pre-procedure Verification/Time Out Yes - 09:58 Taken: Start Time: 09:58 Total Area Debrided (L x W): 0.5 (cm) x 0.8 (cm) = 0.4 (cm) Tissue and other material Viable, Non-Viable, Callus, Slough, Subcutaneous, Slough debrided: Level: Skin/Subcutaneous Tissue Debridement Description: Excisional Instrument: Curette Bleeding: Minimum Hemostasis Achieved: Pressure Response to Treatment: Procedure was tolerated well Level of Consciousness (Post- Awake and Alert procedure): Post  Debridement Measurements of Total Wound Length: (cm) 0.3 Width: (cm) 0.5 Depth: (cm) 0.3 Volume: (cm) 0.035 Character of Wound/Ulcer Post Debridement: Stable Severity of Tissue Post Debridement: Fat layer exposed Post Procedure Diagnosis Same as Pre-procedure Electronic Signature(s) Unsigned Entered By: [name] on 10/16/2021 10:03:10 Signature(s): Date(s): [name] ([id number]) -------------------------------------------------------------------------------- Physician Orders Details Patient Name: [name] Date of Service: 10/16/2021 9:00 AM Medical Record Number: [id number] Patient Account Number: [id number] Date of Birth/Sex: 09-21-1940 (81 y.o. 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 o Nurse Visit as needed [employer] o Wash wounds with antibacterial soap and water. o May shower; gently cleanse wound with antibacterial soap, rinse and pat dry prior to dressing wounds o No tub bath. Anesthetic (Use 'Patient Medications' Section for Anesthetic Order Entry) o Lidocaine applied to wound bed Edema Control - Lymphedema / Segmental Compressive Device / Other o Elevate, Exercise Daily and Avoid Standing for Long Periods of Time. o Elevate legs to the level of the heart and pump ankles as often as possible o Elevate leg(s) parallel to the floor when sitting. o DO YOUR BEST to sleep in  the bed at night. DO NOT sleep in your recliner. Long hours of sitting in a recliner leads to swelling of the legs and/or potential wounds on your backside. Wound Treatment Wound #1 - Toe Great Wound Laterality: Left Cleanser: [place] Ancillary Kit - 15 Day Supply (Generic) Every Other Day/30 Days Discharge Instructions: Use supplies as instructed; Kit contains: (15) Saline Bullets; (15) 3x3 Gauze; 15 pr Gloves Cleanser: Normal Saline Every Other Day/30 Days Discharge  Instructions: Wash your hands with soap and water. Remove old dressing, discard into plastic bag and place into trash. Cleanse the wound with Normal Saline prior to applying a clean dressing using gauze sponges, not tissues or cotton balls. Do not scrub or use excessive force. Pat dry using gauze sponges, not tissue or cotton balls. Primary Dressing: Hydrofera Blue Ready Transfer Foam, 2.5x2.5 (in/in) (Generic) Every Other Day/30 Days Discharge Instructions: Apply Hydrofera Blue Ready to wound bed as directed Secondary Dressing: Coverlet Latex-Free Fabric Adhesive Dressings Every Other Day/30 Days Discharge Instructions: 1.5 x 2 Add-Ons: Gauze Non-Bordered 4x4 (in/in) Every Other Day/30 Days Discharge Instructions: Cover with dry gauze, rolled up and placed under left great toe Wound #3 - Lower Leg Wound Laterality: Left, Anterior Cleanser: Normal Saline Discharge Instructions: Wash your hands with soap and water. Remove old dressing, discard into plastic bag and place into trash. Cleanse the wound with Normal Saline prior to applying a clean dressing using gauze sponges, not tissues or cotton balls. Do not scrub or use excessive force. Pat dry using gauze sponges, not tissue or cotton balls. Cleanser: Soap and Water Discharge Instructions: Gently cleanse wound with antibacterial soap, rinse and pat dry prior to dressing wounds [name] Kitchen ([id number]) Primary Dressing: Xeroform-HBD 2x2 (in/in) Discharge Instructions: Apply  Xeroform-HBD 2x2 (in/in) as directed Secondary Dressing: Coverlet Latex-Free Fabric Adhesive Dressings Discharge Instructions: [place] Electronic Signature(s) Unsigned Entered By: [name] on 10/16/2021 10:03:23 Signature(s): Date(s): [name], [name] ([id number]) -------------------------------------------------------------------------------- Problem List Details Patient Name: [name] Date of Service: 10/16/2021 9:00 AM Medical Record Number: [id number] Patient Account Number: [id number] Date of Birth/Sex: 03-21-1940 (81 y.o. 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

## 2021-10-19 NOTE — Progress Notes (Signed)
Petraitis, [name] ([id number]) Visit Report for 10/16/2021 Arrival Information Details Patient Name: PEREL, HAUSCHILD Date of Service: 10/16/2021 9:00 AM Medical Record Number: [id number] Patient Account Number: [id number] Date of Birth/Sex: [birth year] (81 y.o. F) Treating RN: Carlene Coria Primary Care Caliyah Sieh: Claris Gower Other Clinician: Massie Kluver Referring Nakeda Lebron: Claris Gower Treating Mayda Shippee/Extender: Skipper Cliche in Treatment: 26 Visit Information History Since Last Visit All ordered tests and consults were completed: No Patient Arrived: [name] Added or deleted any medications: No Arrival Time: 09:16 Any new allergies or adverse reactions: No Transfer Assistance: None Had a fall or experienced change in No Patient Requires Transmission-Based Precautions: No activities of daily living that may affect Patient Has Alerts: No risk of falls: Hospitalized since last visit: No Pain Present Now: No Electronic Signature(s) Signed: 10/19/2021 5:10:30 PM By: Massie Kluver Entered By: Massie Kluver on 10/16/2021 09:17:16 Henneman, [name] ([id number]) -------------------------------------------------------------------------------- Clinic Level of Care Assessment Details Patient Name: [name] Date of Service: 10/16/2021 9:00 AM Medical Record Number: [id number] Patient Account Number: [id number] Date of Birth/Sex: Jul 02, 1940 (81 y.o. F) Treating RN: Carlene Coria Primary Care Clemmie Marxen: Claris Gower Other Clinician: Massie Kluver Referring Revel Stellmach: Claris Gower Treating Brixton Franko/Extender: Skipper Cliche in Treatment: 26 Clinic Level of Care Assessment Items TOOL 1 Quantity Score _0  - Use when EandM and Procedure is performed on INITIAL visit 0 ASSESSMENTS - Nursing Assessment / Reassessment _1  - General Physical Exam (combine w/ comprehensive assessment (listed just below) when performed on new 0 pt. evals) _2  - 0 Comprehensive Assessment (HX, ROS,  Risk Assessments, Wounds Hx, etc.) ASSESSMENTS - Wound and Skin Assessment / Reassessment _3  - Dermatologic / Skin Assessment (not related to wound area) 0 ASSESSMENTS - Ostomy and/or Continence Assessment and Care _4  - Incontinence Assessment and Management 0 _5  - 0 Ostomy Care Assessment and Management (repouching, etc.) PROCESS - Coordination of Care _6  - Simple Patient / Family Education for ongoing care 0 _7  - 0 Complex (extensive) Patient / Family Education for ongoing care _8  - 0 Staff obtains Programmer, systems, Records, Test Results / Process Orders _9  - 0 Staff telephones HHA, Nursing Homes / Clarify orders / etc _10  - 0 Routine Transfer to another Facility (non-emergent condition) _11  - 0 Routine Hospital Admission (non-emergent condition) _12  - 0 New Admissions / Biomedical engineer / Ordering NPWT, Apligraf, etc. _13  - 0 Emergency Hospital Admission (emergent condition) PROCESS - Special Needs _14  - Pediatric / Minor Patient Management 0 _15  - 0 Isolation Patient Management _16  - 0 Hearing / Language / Visual special needs _17  - 0 Assessment of Community assistance (transportation, D/C planning, etc.) _18  - 0 Additional assistance / Altered mentation _19  - 0 Support Surface(s) Assessment (bed, cushion, seat, etc.) INTERVENTIONS - Miscellaneous _20  - External ear exam 0 _21  - 0 Patient Transfer (multiple staff / Civil Service fast streamer / Similar devices) _22  - 0 Simple Staple / Suture removal (25 or less) _23  - 0 Complex Staple / Suture removal (26 or more) _24  - 0 Hypo/Hyperglycemic Management (do not check if billed separately) _25  - 0 Ankle / Brachial Index (ABI) - do not check if billed separately Has the patient been seen at the hospital within the last three years: Yes Total Score: 0 Level Of Care: ____ [name] ([id number]) Electronic Signature(s) Signed: 10/19/2021 5:10:30 PM By: Massie Kluver Entered By: Massie Kluver on 10/16/2021 10:03:29 Carline, [name]  ([id number]) -------------------------------------------------------------------------------- Encounter Discharge Information Details Patient Name: [name] Date of Service: 10/16/2021 9:00 AM Medical  Record Number: [id number] Patient Account Number: [id number] Date of Birth/Sex: 10-Jun-1940 (81 y.o. F) Treating RN: Carlene Coria Primary Care Tekeyah Santiago: Claris Gower Other Clinician: Massie Kluver Referring Llewelyn Sheaffer: Claris Gower Treating Kentrail Shew/Extender: Skipper Cliche in Treatment: 26 Encounter Discharge Information Items Post Procedure Vitals Discharge Condition: Stable Temperature (F): 98.0 Ambulatory Status: Cane Pulse (bpm): 106 Discharge Destination: Home Respiratory Rate (breaths/min): 18 Transportation: Private Auto Blood Pressure (mmHg): 154/79 Accompanied By: self Schedule Follow-up Appointment: No Clinical Summary of Care: Electronic Signature(s) Signed: 10/19/2021 5:10:30 PM By: Massie Kluver Entered By: Massie Kluver on 10/16/2021 13:12:49 Sahota, [name] ([id number]) -------------------------------------------------------------------------------- Lower Extremity Assessment Details Patient Name: [name] Date of Service: 10/16/2021 9:00 AM Medical Record Number: [id number] Patient Account Number: [id number] Date of Birth/Sex: October 26, 1940 (81 y.o. F) Treating RN: Carlene Coria Primary Care Alisan Dokes: Claris Gower Other Clinician: Massie Kluver Referring Bejamin Hackbart: Claris Gower Treating Harald Quevedo/Extender: Skipper Cliche in Treatment: 26 Edema Assessment Assessed: [Left: Yes] [Right: No] Edema: [Left: Ye] [Right: s] Calf Left: Right: Point of Measurement: 39 cm From Medial Instep 40 cm Ankle Left: Right: Point of Measurement: 10 cm From Medial Instep 25.2 cm Vascular Assessment Pulses: Dorsalis Pedis Palpable: [Left:Yes] Posterior Tibial Palpable: [Left:Yes] Electronic Signature(s) Signed: 10/19/2021 4:28:34 PM By: Carlene Coria  RN Signed: 10/19/2021 5:10:30 PM By: Massie Kluver Entered By: Massie Kluver on 10/16/2021 09:30:30 Mathes, [name] ([id number]) -------------------------------------------------------------------------------- Multi Wound Chart Details Patient Name: [name] Date of Service: 10/16/2021 9:00 AM Medical Record Number: [id number] Patient Account Number: [id number] Date of Birth/Sex: 15-Oct-1940 (81 y.o. 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 Comorbid History: Chronic sinus problems/congestion, Chronic sinus problems/congestion, N/A Anemia, Sleep Apnea, Congestive Anemia, Sleep Apnea, Congestive Heart Failure, Hypertension, Type II Heart Failure, Hypertension, Type II Diabetes, Neuropathy Diabetes, Neuropathy Date Acquired: 02/23/2020 10/15/2021 N/A Weeks of Treatment: 26 0 N/A Wound Status: Open Open N/A Wound Recurrence: No No N/A Measurements L x W x D (cm) 0.3x0.5x0.2 1.7x1x0.1 N/A Area (cm) : 0.118 1.335 N/A Volume (cm) : 0.024 0.134 N/A % Reduction in Area: 97.50% 0.00% N/A % Reduction in Volume: 94.90% 0.00% N/A Classification: Grade 1 Full Thickness Without Exposed N/A Support Structures Exudate Amount: Medium Medium N/A Exudate Type: Serosanguineous Serosanguineous N/A Exudate Color: red, brown red, brown N/A Wound Margin: Thickened N/A N/A Granulation Amount: Medium (34-66%) None Present (0%) N/A Granulation Quality: Pink N/A  N/A Necrotic Amount: Medium (34-66%) None Present (0%) N/A Exposed Structures: Fat Layer (Subcutaneous Tissue): Fat Layer (Subcutaneous Tissue): N/A Yes Yes Fascia: No Tendon: No Muscle: No Joint: No Bone: No Epithelialization: Small (1-33%) None N/A Treatment Notes Electronic Signature(s) Signed: 10/19/2021 5:10:30 PM By: Massie Kluver Entered By: Massie Kluver on 10/16/2021 09:30:51 Carvey, [name] ([id number]) Mcfadyen, [name] ([id number]) -------------------------------------------------------------------------------- Multi-Disciplinary Care Plan Details Patient Name: [name] Date of Service: 10/16/2021 9:00 AM Medical Record Number: [id number] Patient Account Number: [id number] Date of Birth/Sex: 1940-05-24 (81 y.o. F) Treating RN: Carlene Coria Primary Care Alaney Witter: Claris Gower Other Clinician: Massie Kluver Referring Cristalle Rohm: Claris Gower Treating Sheranda Seabrooks/Extender: Skipper Cliche in Treatment: 26 Active Inactive Wound/Skin Impairment Nursing Diagnoses:  Impaired tissue integrity Knowledge deficit related to ulceration/compromised skin integrity Goals: Ulcer/skin breakdown will have a volume reduction of 30% by week 4 Date Initiated: 04/17/2021 Date Inactivated: 07/03/2021 Target Resolution Date: 05/15/2021 Goal Status: Unmet Unmet Reason: comorbities Ulcer/skin breakdown will have a volume reduction of 50% by week 8 Date Initiated: 04/17/2021 Date Inactivated: 07/03/2021 Target Resolution Date: 06/12/2021 Goal Status: Met Ulcer/skin breakdown will have a volume reduction of 80% by week 12 Date Initiated: 04/17/2021 Target Resolution Date: 07/10/2021 Goal Status: Active Ulcer/skin breakdown will heal within 14 weeks Date Initiated: 04/17/2021 Target Resolution Date: 07/24/2021 Goal Status: Active Interventions: Assess patient/caregiver ability to obtain necessary supplies Assess patient/caregiver ability to perform ulcer/skin care regimen upon  admission and as needed Assess ulceration(s) every visit Provide education on ulcer and skin care Notes: Electronic Signature(s) Signed: 10/19/2021 4:28:34 PM By: Carlene Coria RN Signed: 10/19/2021 5:10:30 PM By: Massie Kluver Entered By: Massie Kluver on 10/16/2021 09:30:34 Menon, [name] ([id number]) -------------------------------------------------------------------------------- Pain Assessment Details Patient Name: [name] Date of Service: 10/16/2021 9:00 AM Medical Record Number: [id number] Patient Account Number: [id number] Date of Birth/Sex: July 30, 1940 (81 y.o. F) Treating RN: Carlene Coria Primary Care Zenya Hickam: Claris Gower Other Clinician: Massie Kluver Referring Anu Stagner: Claris Gower Treating Arwen Haseley/Extender: Skipper Cliche in Treatment: 26 Active Problems Location of Pain Severity and Description of Pain Patient Has Paino No Site Locations Pain Management and Medication Current Pain Management: Electronic Signature(s) Signed: 10/19/2021 4:28:34 PM By: Carlene Coria RN Signed: 10/19/2021 5:10:30 PM By: Massie Kluver Entered By: Massie Kluver on 10/16/2021 09:19:53 Surowiec, [name] ([id number]) -------------------------------------------------------------------------------- Patient/Caregiver Education Details Patient Name: [name] Date of Service: 10/16/2021 9:00 AM Medical Record Number: [id number] Patient Account Number: [id number] Date of Birth/Gender: Apr 26, 1940 (81 y.o. F) Treating RN: Carlene Coria Primary Care Physician: Claris Gower Other Clinician: Massie Kluver Referring Physician: Claris Gower Treating Physician/Extender: Skipper Cliche in Treatment: 26 Education Assessment Education Provided To: Patient Education Topics Provided Wound/Skin Impairment: Handouts: Other: continue wound care as directed Methods: Explain/Verbal Responses: State content correctly Electronic Signature(s) Signed: 10/19/2021 5:10:30 PM By:  Massie Kluver Entered By: Massie Kluver on 10/16/2021 10:03:57 Mattix, [name] ([id number]) -------------------------------------------------------------------------------- Wound Assessment Details Patient Name: [name] Date of Service: 10/16/2021 9:00 AM Medical Record Number: [id number] Patient Account Number: [id number] Date of Birth/Sex: 14-Jan-1941 (81 y.o. F) Treating RN: Carlene Coria Primary Care Trev Boley: Claris Gower Other Clinician: Massie Kluver Referring Peighton Edgin: Claris Gower Treating Jaime Grizzell/Extender: Skipper Cliche in Treatment: 26 Wound Status Wound Number: 1 Primary Diabetic Wound/Ulcer of the Lower Extremity Etiology: Wound Location: Left Toe Great Wound Open Wounding Event: Gradually Appeared Status: Date Acquired: 02/23/2020 Comorbid Chronic sinus problems/congestion, Anemia, Sleep Apnea, Weeks Of Treatment: 26 History: Congestive Heart Failure, Hypertension, Type II Diabetes, Clustered Wound: No Neuropathy Photos Wound Measurements Length: (cm) 0.3 Width: (cm) 0.5 Depth: (cm) 0.2 Area: (cm) 0.118 Volume: (cm) 0.024 % Reduction in Area: 97.5% % Reduction in Volume: 94.9% Epithelialization: Small (1-33%) Wound Description Classification: Grade 1 Wound Margin: Thickened Exudate Amount: Medium Exudate Type: Serosanguineous Exudate Color: red, brown Foul Odor After Cleansing: No Slough/Fibrino Yes Wound Bed Granulation Amount: Medium (34-66%) Exposed Structure Granulation Quality: Pink Fat Layer (Subcutaneous Tissue) Exposed: Yes Necrotic Amount: Medium (34-66%) Necrotic Quality: Adherent Slough Treatment Notes Wound #1 (Toe Great) Wound Laterality: Left Cleanser [place] Ancillary Kit - 15 Day Supply Discharge Instruction: Use supplies as instructed; Kit contains: (15) Saline Bullets; (15) 3x3 Gauze; 15 pr Gloves Normal Saline Discharge Instruction: Wash your hands with  soap and water. Remove old dressing, discard into plastic  bag and place into trash. Cleanse the wound with Normal Saline prior to applying a clean dressing using gauze sponges, not tissues or cotton balls. Do not scrub or use excessive force. Pat dry using gauze sponges, not tissue or cotton balls. Gaillard, [name] ([id number]) Peri-Wound Care Topical Primary Dressing Hydrofera Blue Ready Transfer Foam, 2.5x2.5 (in/in) Discharge Instruction: Apply Hydrofera Blue Ready to wound bed as directed Secondary Dressing Coverlet Latex-Free Fabric Adhesive Dressings Discharge Instruction: 1.5 x 2 Secured With Compression Wrap Compression Stockings Add-Ons Gauze Non-Bordered 4x4 (in/in) Discharge Instruction: Cover with dry gauze, rolled up and placed under left great toe Electronic Signature(s) Signed: 10/19/2021 4:28:34 PM By: Carlene Coria RN Signed: 10/19/2021 5:10:30 PM By: Massie Kluver Entered By: Massie Kluver on 10/16/2021 09:29:01 Polsky, [name] ([id number]) -------------------------------------------------------------------------------- Wound Assessment Details Patient Name: [name] Date of Service: 10/16/2021 9:00 AM Medical Record Number: [id number] Patient Account Number: [id number] Date of Birth/Sex: 1940/06/04 (81 y.o. F) Treating RN: Carlene Coria Primary Care Ceonna Frazzini: Claris Gower Other Clinician: Massie Kluver Referring Carlicia Leavens: Claris Gower Treating Rhealynn Myhre/Extender: Skipper Cliche in Treatment: 26 Wound Status Wound Number: 3 Primary Skin Tear Etiology: Wound Location: Left, Anterior Lower Leg Wound Open Wounding Event: Blister Status: Date Acquired: 10/15/2021 Comorbid Chronic sinus problems/congestion, Anemia, Sleep Apnea, Weeks Of Treatment: 0 History: Congestive Heart Failure, Hypertension, Type II Diabetes, Clustered Wound: No Neuropathy Photos Wound Measurements Length: (cm) 1.7 Width: (cm) 1 Depth: (cm) 0.1 Area: (cm) 1.335 Volume: (cm) 0.134 % Reduction in Area: 0% % Reduction in  Volume: 0% Epithelialization: None Tunneling: No Undermining: No Wound Description Classification: Full Thickness Without Exposed Support Structures Exudate Amount: Medium Exudate Type: Serosanguineous Exudate Color: red, brown Foul Odor After Cleansing: No Slough/Fibrino Yes Wound Bed Granulation Amount: None Present (0%) Exposed Structure Necrotic Amount: None Present (0%) Fascia Exposed: No Fat Layer (Subcutaneous Tissue) Exposed: Yes Tendon Exposed: No Muscle Exposed: No Joint Exposed: No Bone Exposed: No Treatment Notes Wound #3 (Lower Leg) Wound Laterality: Left, Anterior Cleanser Normal Saline Discharge Instruction: Wash your hands with soap and water. Remove old dressing, discard into plastic bag and place into trash. Cleanse the wound with Normal Saline prior to applying a clean dressing using gauze sponges, not tissues or cotton balls. Do not scrub or use excessive force. Pat dry using gauze sponges, not tissue or cotton balls. Macmillan, [name] ([id number]) Soap and Water Discharge Instruction: Gently cleanse wound with antibacterial soap, rinse and pat dry prior to dressing wounds Peri-Wound Care Topical Primary Dressing Xeroform-HBD 2x2 (in/in) Discharge Instruction: Apply Xeroform-HBD 2x2 (in/in) as directed Secondary Dressing Coverlet Latex-Free Fabric Adhesive Dressings Discharge Instruction: Knuckle Secured With Compression Wrap Compression Stockings Add-Ons Electronic Signature(s) Signed: 10/19/2021 4:28:34 PM By: Carlene Coria RN Signed: 10/19/2021 5:10:30 PM By: Massie Kluver Entered By: Massie Kluver on 10/16/2021 09:29:24 Cervantez, [name] ([id number]) -------------------------------------------------------------------------------- Vitals Details Patient Name: [name] Date of Service: 10/16/2021 9:00 AM Medical Record Number: [id number] Patient Account Number: [id number] Date of Birth/Sex: 20-Jan-1941 (81 y.o. F) Treating RN: Carlene Coria Primary Care Dua Mehler: Claris Gower Other Clinician: Massie Kluver Referring Nell Gales: Claris Gower Treating Briyanna Billingham/Extender: Skipper Cliche in Treatment: 26 Vital Signs Time Taken: 09:17 Temperature (F): 98.0 Height (in): 61 Pulse (bpm): 106 Weight (lbs): 250 Respiratory Rate (breaths/min): 18 Body Mass Index (BMI): 47.2 Blood Pressure (mmHg): 154/79 Reference Range: 80 - 120 mg / dl Electronic Signature(s) Signed: 10/19/2021 5:10:30 PM By: Massie Kluver Entered By:  Massie Kluver on 10/16/2021 09:19:48

## 2021-10-23 ENCOUNTER — Encounter: Payer: Medicare Other | Attending: Physician Assistant | Admitting: Physician Assistant

## 2021-10-23 DIAGNOSIS — I10 Essential (primary) hypertension: Secondary | ICD-10-CM | POA: Insufficient documentation

## 2021-10-23 DIAGNOSIS — Z89411 Acquired absence of right great toe: Secondary | ICD-10-CM | POA: Diagnosis not present

## 2021-10-23 DIAGNOSIS — I509 Heart failure, unspecified: Secondary | ICD-10-CM | POA: Insufficient documentation

## 2021-10-23 DIAGNOSIS — L97822 Non-pressure chronic ulcer of other part of left lower leg with fat layer exposed: Secondary | ICD-10-CM | POA: Diagnosis not present

## 2021-10-23 DIAGNOSIS — Z89421 Acquired absence of other right toe(s): Secondary | ICD-10-CM | POA: Diagnosis not present

## 2021-10-23 DIAGNOSIS — I110 Hypertensive heart disease with heart failure: Secondary | ICD-10-CM | POA: Diagnosis not present

## 2021-10-23 DIAGNOSIS — E1140 Type 2 diabetes mellitus with diabetic neuropathy, unspecified: Secondary | ICD-10-CM | POA: Insufficient documentation

## 2021-10-23 DIAGNOSIS — G4730 Sleep apnea, unspecified: Secondary | ICD-10-CM | POA: Insufficient documentation

## 2021-10-23 DIAGNOSIS — L97522 Non-pressure chronic ulcer of other part of left foot with fat layer exposed: Secondary | ICD-10-CM | POA: Diagnosis not present

## 2021-10-23 DIAGNOSIS — E11621 Type 2 diabetes mellitus with foot ulcer: Secondary | ICD-10-CM | POA: Insufficient documentation

## 2021-10-23 NOTE — Progress Notes (Addendum)
[name], [name] ([id number]) Visit Report for 10/23/2021 Arrival Information Details Patient Name: [name], [name] Date of Service: 10/23/2021 9:00 AM Medical Record Number: [id number] Patient Account Number: [id number] Date of Birth/Sex: 07/07/1940 (81 y.o. F) Treating RN: Cornell Barman Primary Care Jestine Bicknell: Claris Gower Other Clinician: Massie Kluver Referring Elverna Caffee: Claris Gower Treating Caitlin Ainley/Extender: Skipper Cliche in Treatment: 27 Visit Information History Since Last Visit All ordered tests and consults were completed: No Patient Arrived: [name] Added or deleted any medications: No Arrival Time: 09:13 Any new allergies or adverse reactions: No Transfer Assistance: None Had a fall or experienced change in No Patient Requires Transmission-Based Precautions: No activities of daily living that may affect Patient Has Alerts: No risk of falls: Hospitalized since last visit: No Pain Present Now: No Electronic Signature(s) Signed: 10/23/2021 12:54:01 PM By: Gretta Cool, BSN, RN, CWS, [name] RN, BSN Entered By: Gretta Cool, BSN, RN, CWS, [name] on 10/23/2021 09:13:40 Spackman, [name] ([id number]) -------------------------------------------------------------------------------- Clinic Level of Care Assessment Details Patient Name: [name] Date of Service: 10/23/2021 9:00 AM Medical Record Number: [id number] Patient Account Number: [id number] Date of Birth/Sex: March 24, 1940 (81 y.o. F) Treating RN: Cornell Barman Primary Care Donita Newland: Claris Gower Other Clinician: Massie Kluver Referring Christopherjohn Schiele: Claris Gower Treating Sukari Grist/Extender: Skipper Cliche in Treatment: 27 Clinic Level of Care Assessment Items TOOL 1 Quantity Score '[]'  - Use when EandM and Procedure is performed on INITIAL visit 0 ASSESSMENTS - Nursing Assessment / Reassessment '[]'  - General Physical Exam (combine w/ comprehensive assessment (listed just below) when performed on new 0 pt. evals) '[]'  - 0 Comprehensive  Assessment (HX, ROS, Risk Assessments, Wounds Hx, etc.) ASSESSMENTS - Wound and Skin Assessment / Reassessment '[]'  - Dermatologic / Skin Assessment (not related to wound area) 0 ASSESSMENTS - Ostomy and/or Continence Assessment and Care '[]'  - Incontinence Assessment and Management 0 '[]'  - 0 Ostomy Care Assessment and Management (repouching, etc.) PROCESS - Coordination of Care '[]'  - Simple Patient / Family Education for ongoing care 0 '[]'  - 0 Complex (extensive) Patient / Family Education for ongoing care '[]'  - 0 Staff obtains Programmer, systems, Records, Test Results / Process Orders '[]'  - 0 Staff telephones HHA, Nursing Homes / Clarify orders / etc '[]'  - 0 Routine Transfer to another Facility (non-emergent condition) '[]'  - 0 Routine Hospital Admission (non-emergent condition) '[]'  - 0 New Admissions / Biomedical engineer / Ordering NPWT, Apligraf, etc. '[]'  - 0 Emergency Hospital Admission (emergent condition) PROCESS - Special Needs '[]'  - Pediatric / Minor Patient Management 0 '[]'  - 0 Isolation Patient Management '[]'  - 0 Hearing / Language / Visual special needs '[]'  - 0 Assessment of Community assistance (transportation, D/C planning, etc.) '[]'  - 0 Additional assistance / Altered mentation '[]'  - 0 Support Surface(s) Assessment (bed, cushion, seat, etc.) INTERVENTIONS - Miscellaneous '[]'  - External ear exam 0 '[]'  - 0 Patient Transfer (multiple staff / Civil Service fast streamer / Similar devices) '[]'  - 0 Simple Staple / Suture removal (25 or less) '[]'  - 0 Complex Staple / Suture removal (26 or more) '[]'  - 0 Hypo/Hyperglycemic Management (do not check if billed separately) '[]'  - 0 Ankle / Brachial Index (ABI) - do not check if billed separately Has the patient been seen at the hospital within the last three years: Yes Total Score: 0 Level Of Care: ____ [name] ([id number]) Electronic Signature(s) Signed: 10/23/2021 12:54:01 PM By: Gretta Cool, BSN, RN, CWS, [name] RN, BSN Entered By: Gretta Cool, BSN, RN, CWS, [name] on  10/23/2021 09:39:04 Ost, [name] ([id number]) --------------------------------------------------------------------------------  Encounter Discharge Information Details Patient Name: [name], [name] Date of Service: 10/23/2021 9:00 AM Medical Record Number: [id number] Patient Account Number: [id number] Date of Birth/Sex: [birth year] (81 y.o. F) Treating RN: Cornell Barman Primary Care Kellsie Grindle: Claris Gower Other Clinician: Massie Kluver Referring Cassadee Vanzandt: Claris Gower Treating Dalonte Hardage/Extender: Skipper Cliche in Treatment: 27 Encounter Discharge Information Items Post Procedure Vitals Discharge Condition: Stable Temperature (F): 97.8 Ambulatory Status: Cane Pulse (bpm): 80 Discharge Destination: Home Respiratory Rate (breaths/min): 18 Transportation: Other Blood Pressure (mmHg): 174/66 Accompanied By: self Schedule Follow-up Appointment: Yes Clinical Summary of Care: Electronic Signature(s) Signed: 10/23/2021 12:54:01 PM By: Gretta Cool, BSN, RN, CWS, [name] RN, BSN Entered By: Gretta Cool, BSN, RN, CWS, [name] on 10/23/2021 09:56:18 Fawbush, [name] ([id number]) -------------------------------------------------------------------------------- Lower Extremity Assessment Details Patient Name: [name] Date of Service: 10/23/2021 9:00 AM Medical Record Number: [id number] Patient Account Number: [id number] Date of Birth/Sex: 05/08/1940 (81 y.o. F) Treating RN: Cornell Barman Primary Care Jolan Mealor: Claris Gower Other Clinician: Massie Kluver Referring Roxan Yamamoto: Claris Gower Treating Lavone Weisel/Extender: Skipper Cliche in Treatment: 27 Edema Assessment Assessed: [Left: Yes] [Right: No] Edema: [Left: Ye] [Right: s] Calf Left: Right: Point of Measurement: 39 cm From Medial Instep 39.1 cm Ankle Left: Right: Point of Measurement: 10 cm From Medial Instep 26.7 cm Vascular Assessment Pulses: Dorsalis Pedis Palpable: [Left:Yes] Posterior Tibial Palpable: [Left:Yes] Electronic  Signature(s) Signed: 10/23/2021 12:54:01 PM By: Gretta Cool, BSN, RN, CWS, [name] RN, BSN Entered By: Gretta Cool, BSN, RN, CWS, [name] on 10/23/2021 09:24:35 Marandola, [name] ([id number]) -------------------------------------------------------------------------------- Multi Wound Chart Details Patient Name: [name] Date of Service: 10/23/2021 9:00 AM Medical Record Number: [id number] Patient Account Number: [id number] Date of Birth/Sex: 09/16/1940 (81 y.o. 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 Comorbid History: Chronic sinus problems/congestion, Chronic sinus problems/congestion, N/A Anemia, Sleep Apnea, Congestive Anemia, Sleep Apnea, Congestive Heart Failure, Hypertension, Type II Heart Failure, Hypertension, Type II Diabetes, Neuropathy Diabetes, Neuropathy Date Acquired: 02/23/2020 10/15/2021 N/A Weeks of Treatment: 27 1 N/A Wound Status: Open Open N/A Wound Recurrence: No No N/A Measurements L x W x D (cm) 0.3x0.6x0.2 0.1x0.1x0.1 N/A Area (cm) : 0.141 0.008 N/A Volume (cm) : 0.028 0.001 N/A % Reduction in Area: 97.00% 99.40% N/A % Reduction in Volume: 94.10% 99.30% N/A Classification: Grade 1 Full Thickness Without Exposed N/A Support Structures Exudate Amount: Medium Small N/A Exudate Type: Serosanguineous Serosanguineous N/A Exudate Color: red, brown red, brown N/A Wound Margin: Thickened N/A N/A Granulation Amount: Medium (34-66%) None Present (0%)  N/A Granulation Quality: Pink N/A N/A Necrotic Amount: Medium (34-66%) None Present (0%) N/A Exposed Structures: Fat Layer (Subcutaneous Tissue): Fat Layer (Subcutaneous Tissue): N/A Yes Yes Fascia: No Tendon: No Muscle: No Joint: No Bone: No Epithelialization: Small (1-33%) Large (67-100%) N/A Treatment Notes Electronic Signature(s) Signed: 10/23/2021 12:54:01 PM By: Gretta Cool, BSN, RN, CWS, [name] RN, BSN Entered By: Gretta Cool, BSN, RN, CWS, [name] on 10/23/2021 09:24:51 Klebba, [name] ([id number]) Fleites, [name] ([id number]) -------------------------------------------------------------------------------- Multi-Disciplinary Care Plan Details Patient Name: [name] Date of Service: 10/23/2021 9:00 AM Medical Record Number: [id number] Patient Account Number: [id number] Date of Birth/Sex: Jun 29, 1940 (81 y.o.  F) Treating RN: Cornell Barman Primary Care [place] Sloop: Claris Gower Other Clinician: Massie Kluver Referring Eveleigh Crumpler: Claris Gower Treating Stina Gane/Extender: Skipper Cliche in Treatment: 27 Active Inactive Wound/Skin Impairment Nursing Diagnoses: Impaired tissue integrity Knowledge deficit related to ulceration/compromised skin integrity Goals: Ulcer/skin breakdown will have a volume reduction of 30% by week 4 Date Initiated: 04/17/2021 Date Inactivated: 07/03/2021 Target Resolution Date: 05/15/2021 Goal Status: Unmet Unmet Reason: comorbities Ulcer/skin breakdown will have a volume reduction of 50% by week 8 Date Initiated: 04/17/2021 Date Inactivated: 07/03/2021 Target Resolution Date: 06/12/2021 Goal Status: Met Ulcer/skin breakdown will have a volume reduction of 80% by week 12 Date Initiated: 04/17/2021 Target Resolution Date: 07/10/2021 Goal Status: Active Ulcer/skin breakdown will heal within 14 weeks Date Initiated: 04/17/2021 Target Resolution Date: 07/24/2021 Goal Status: Active Interventions: Assess patient/caregiver ability to obtain necessary supplies Assess  patient/caregiver ability to perform ulcer/skin care regimen upon admission and as needed Assess ulceration(s) every visit Provide education on ulcer and skin care Notes: Electronic Signature(s) Signed: 10/23/2021 12:54:01 PM By: Gretta Cool, BSN, RN, CWS, [name] RN, BSN Entered By: Gretta Cool, BSN, RN, CWS, [name] on 10/23/2021 09:24:45 Tilmon, [name] ([id number]) -------------------------------------------------------------------------------- Pain Assessment Details Patient Name: [name] Date of Service: 10/23/2021 9:00 AM Medical Record Number: [id number] Patient Account Number: [id number] Date of Birth/Sex: Apr 09, 1940 (81 y.o. F) Treating RN: Cornell Barman Primary Care Heron Pitcock: Claris Gower Other Clinician: Massie Kluver Referring France Lusty: Claris Gower Treating Laurel Harnden/Extender: Skipper Cliche in Treatment: 27 Active Problems Location of Pain Severity and Description of Pain Patient Has Paino No Site Locations Pain Management and Medication Current Pain Management: Electronic Signature(s) Signed: 10/23/2021 12:54:01 PM By: Gretta Cool, BSN, RN, CWS, [name] RN, BSN Entered By: Gretta Cool, BSN, RN, CWS, [name] on 10/23/2021 09:16:56 Lanz, [name] ([id number]) -------------------------------------------------------------------------------- Patient/Caregiver Education Details Patient Name: [name] Date of Service: 10/23/2021 9:00 AM Medical Record Number: [id number] Patient Account Number: [id number] Date of Birth/Gender: February 08, 1941 (81 y.o. F) Treating RN: Cornell Barman Primary Care Physician: Claris Gower Other Clinician: Massie Kluver Referring Physician: Claris Gower Treating Physician/Extender: Skipper Cliche in Treatment: 27 Education Assessment Education Provided To: Patient Education Topics Provided Wound/Skin Impairment: Handouts: Other: continue wound care as directed Methods: Explain/Verbal Responses: State content correctly Electronic Signature(s) Signed: 10/23/2021  12:54:01 PM By: Gretta Cool, BSN, RN, CWS, [name] RN, BSN Entered By: Gretta Cool, BSN, RN, CWS, [name] on 10/23/2021 09:53:55 Almanzar, [name] ([id number]) -------------------------------------------------------------------------------- Wound Assessment Details Patient Name: [name] Date of Service: 10/23/2021 9:00 AM Medical Record Number: [id number] Patient Account Number: [id number] Date of Birth/Sex: [birth year] (81 y.o. F) Treating RN: Cornell Barman Primary Care Micha Erck: Claris Gower Other Clinician: Massie Kluver Referring Dante Roudebush: Claris Gower Treating Yerachmiel Spinney/Extender: Skipper Cliche in Treatment: 27 Wound Status Wound Number: 1 Primary Diabetic Wound/Ulcer of the Lower Extremity Etiology: Wound Location: Left Toe Great Wound Open Wounding Event: Gradually Appeared Status: Date Acquired: 02/23/2020 Comorbid Chronic sinus problems/congestion, Anemia, Sleep Apnea, Weeks Of Treatment: 27 History: Congestive Heart Failure, Hypertension, Type II Diabetes, Clustered Wound: No Neuropathy Photos Wound Measurements Length: (cm) 0.3 Width: (cm) 0.6 Depth: (cm) 0.2 Area: (cm) 0.141 Volume: (cm) 0.028 % Reduction in Area: 97% % Reduction in Volume: 94.1% Epithelialization: Small (1-33%) Tunneling: No Undermining: No Wound Description Classification: Grade 1 Wound Margin: Thickened Exudate Amount: Medium Exudate Type: Serosanguineous Exudate Color: red, brown Foul Odor After Cleansing: No Slough/Fibrino Yes Wound Bed Granulation Amount: Medium (34-66%) Exposed Structure Granulation Quality: Pink Fat Layer (Subcutaneous Tissue) Exposed: Yes Necrotic Amount: Medium (34-66%) Necrotic Quality:  Adherent Slough Treatment Notes Wound #1 (Toe Great) Wound Laterality: Left Cleanser [place] Ancillary Kit - 15 Day Supply Discharge Instruction: Use supplies as instructed; Kit contains: (15) Saline Bullets; (15) 3x3 Gauze; 15 pr Gloves Normal Saline Discharge Instruction: Wash your  hands with soap and water. Remove old dressing, discard into plastic bag and place into trash. Cleanse the wound with Normal Saline prior to applying a clean dressing using gauze sponges, not tissues or cotton balls. Do not scrub or use excessive force. Pat dry using gauze sponges, not tissue or cotton balls. Testerman, [name] ([id number]) Peri-Wound Care Topical Primary Dressing Hydrofera Blue Ready Transfer Foam, 2.5x2.5 (in/in) Discharge Instruction: Apply Hydrofera Blue Ready to wound bed as directed Secondary Dressing Coverlet Latex-Free Fabric Adhesive Dressings Discharge Instruction: 1.5 x 2 Secured With Compression Wrap Compression Stockings Add-Ons Gauze Non-Bordered 4x4 (in/in) Discharge Instruction: Cover with dry gauze, rolled up and placed under left great toe Electronic Signature(s) Signed: 10/23/2021 12:54:01 PM By: Gretta Cool, BSN, RN, CWS, [name] RN, BSN Entered By: Gretta Cool, BSN, RN, CWS, [name] on 10/23/2021 09:23:15 Porte, [name] ([id number]) -------------------------------------------------------------------------------- Wound Assessment Details Patient Name: [name] Date of Service: 10/23/2021 9:00 AM Medical Record Number: [id number] Patient Account Number: [id number] Date of Birth/Sex: Apr 15, 1940 (81 y.o. F) Treating RN: Cornell Barman Primary Care Genessis Flanary: Claris Gower Other Clinician: Massie Kluver Referring Azhar Yogi: Claris Gower Treating Alexy Bringle/Extender: Jeri Cos Weeks in Treatment: 27 Wound Status Wound Number: 3 Primary Skin Tear Etiology: Wound Location: Left, Anterior Lower Leg Wound Open Wounding Event: Blister Status: Date Acquired: 10/15/2021 Comorbid Chronic sinus problems/congestion, Anemia, Sleep Apnea, Weeks Of Treatment: 1 History: Congestive Heart Failure, Hypertension, Type II Diabetes, Clustered Wound: No Neuropathy Photos Wound Measurements Length: (cm) 0.1 Width: (cm) 0.1 Depth: (cm) 0.1 Area: (cm) 0.008 Volume: (cm)  0.001 % Reduction in Area: 99.4% % Reduction in Volume: 99.3% Epithelialization: Large (67-100%) Tunneling: No Undermining: No Wound Description Classification: Full Thickness Without Exposed Support Structures Exudate Amount: Small Exudate Type: Serosanguineous Exudate Color: red, brown Foul Odor After Cleansing: No Slough/Fibrino No Wound Bed Granulation Amount: None Present (0%) Exposed Structure Necrotic Amount: None Present (0%) Fascia Exposed: No Fat Layer (Subcutaneous Tissue) Exposed: Yes Tendon Exposed: No Muscle Exposed: No Joint Exposed: No Bone Exposed: No Treatment Notes Wound #3 (Lower Leg) Wound Laterality: Left, Anterior Cleanser Normal Saline Discharge Instruction: Wash your hands with soap and water. Remove old dressing, discard into plastic bag and place into trash. Cleanse the wound with Normal Saline prior to applying a clean dressing using gauze sponges, not tissues or cotton balls. Do not scrub or use excessive force. Pat dry using gauze sponges, not tissue or cotton balls. Bremer, [name] ([id number]) Soap and Water Discharge Instruction: Gently cleanse wound with antibacterial soap, rinse and pat dry prior to dressing wounds Peri-Wound Care Topical Primary Dressing Xeroform-HBD 2x2 (in/in) Discharge Instruction: Apply Xeroform-HBD 2x2 (in/in) as directed Secondary Dressing Coverlet Latex-Free Fabric Adhesive Dressings Discharge Instruction: Knuckle Secured With Compression Wrap Compression Stockings Add-Ons Electronic Signature(s) Signed: 10/23/2021 12:54:01 PM By: Gretta Cool, BSN, RN, CWS, [name] RN, BSN Entered By: Gretta Cool, BSN, RN, CWS, [name] on 10/23/2021 09:23:48 Lesch, [name] ([id number]) -------------------------------------------------------------------------------- Vitals Details Patient Name: [name] Date of Service: 10/23/2021 9:00 AM Medical Record Number: [id number] Patient Account Number: [id number] Date of Birth/Sex: [birth year] (81  y.o. F) Treating RN: Cornell Barman Primary Care Laykin Rainone: Claris Gower Other Clinician: Massie Kluver Referring Madysyn Hanken: Claris Gower Treating Sunshine Mackowski/Extender: Skipper Cliche in Treatment: 27 Vital Signs Time Taken:  09:16 Temperature (F): 97.6 Height (in): 61 Pulse (bpm): 80 Weight (lbs): 250 Respiratory Rate (breaths/min): 18 Body Mass Index (BMI): 47.2 Blood Pressure (mmHg): 174/66 Reference Range: 80 - 120 mg / dl Electronic Signature(s) Signed: 10/23/2021 12:54:01 PM By: Gretta Cool, BSN, RN, CWS, [name] RN, BSN Entered By: Gretta Cool, BSN, RN, CWS, [name] on 10/23/2021 09:16:53

## 2021-10-23 NOTE — Progress Notes (Addendum)
[name], [name] ([id number]) Visit Report for 10/23/2021 Chief Complaint Document Details Patient Name: [name], [name] Date of Service: 10/23/2021 9:00 AM Medical Record Number: [id number] Patient Account Number: [id number] Date of Birth/Sex: September 13, 1940 (81 y.o. F) Treating RN: Cornell Barman Primary Care Provider: Claris Gower Other Clinician: Massie Kluver Referring Provider: Claris Gower Treating Provider/Extender: Skipper Cliche in Treatment: 27 Information Obtained from: Patient Chief Complaint Multiple toe ulcersations Electronic Signature(s) Signed: 10/23/2021 9:12:45 AM By: Worthy Keeler PA-C Entered By: Worthy Keeler on 10/23/2021 09:12:45 [name], [name] ([id number]) -------------------------------------------------------------------------------- Debridement Details Patient Name: [name] Date of Service: 10/23/2021 9:00 AM Medical Record Number: [id number] Patient Account Number: [id number] Date of Birth/Sex: 29-Mar-1940 (81 y.o. F) Treating RN: Cornell Barman Primary Care Provider: Claris Gower Other Clinician: Massie Kluver Referring Provider: Claris Gower Treating Provider/Extender: Skipper Cliche in Treatment: 27 Debridement Performed for Wound #1 Left Toe Great Assessment: Performed By: Physician Tommie Sams., PA-C Debridement Type: Debridement Severity of Tissue Pre Debridement: Fat layer exposed Level of Consciousness (Pre- Awake and Alert procedure): Pre-procedure Verification/Time Out Yes - 09:34 Taken: Start Time: 09:34 Total Area Debrided (L x W): 1 (cm) x 1 (cm) = 1 (cm) Tissue and other material Viable, Non-Viable, Callus, Slough, Subcutaneous, Slough debrided: Level: Skin/Subcutaneous Tissue Debridement Description: Excisional Instrument: Curette Bleeding: Minimum Hemostasis Achieved: Pressure End Time: 09:38 Response to Treatment: Procedure was tolerated well Level of Consciousness (Post- Awake and Alert procedure): Post  Debridement Measurements of Total Wound Length: (cm) 0.3 Width: (cm) 0.6 Depth: (cm) 0.3 Volume: (cm) 0.042 Character of Wound/Ulcer Post Debridement: Stable Severity of Tissue Post Debridement: Fat layer exposed Post Procedure Diagnosis Same as Pre-procedure Electronic Signature(s) Signed: 10/23/2021 12:54:01 PM By: Gretta Cool, BSN, RN, CWS, [name] RN, BSN Signed: 10/23/2021 1:57:25 PM By: Worthy Keeler PA-C Entered By: Gretta Cool, BSN, RN, CWS, [name] on 10/23/2021 09:38:28 [name], [name] ([id number]) -------------------------------------------------------------------------------- HPI Details Patient Name: [name] Date of Service: 10/23/2021 9:00 AM Medical Record Number: [id number] Patient Account Number: [id number] Date of Birth/Sex: 06-Aug-1940 (81 y.o. F) Treating RN: Cornell Barman Primary Care Provider: Claris Gower Other Clinician: Massie Kluver Referring Provider: Claris Gower Treating Provider/Extender: Skipper Cliche in Treatment: 27 History of Present Illness HPI Description: 04/17/2021 upon evaluation patient appears to be doing somewhat poorly currently in regard to her first and second toe of the left foot. She previously has been seen by Dr. Doran Durand she saw him on 31 January he did not feel there was any evidence of osteomyelitis. He did give her a thorough evaluation including x-rays and showed no abnormal findings according to notes. With that being said he felt like that wound care would be beneficial therefore she contacted [place]. She has currently been using antibiotic ointment and has noted that this wound on the great toe has been there for about a year. She is not certain whether there is anything on the second toe or not she has not really noted any drainage but at the same time she cannot be sure there is nothing hiding up underneath a significant callus here. Her feet really do not have any major complications with regard to pain and that is good news. She does have  neuropathy. Patient does have a history of diabetes mellitus type 2, hypertension, and her most recent hemoglobin A1c was 7.6 on June 2022. 05/08/2021 upon evaluation today patient appears to be doing poorly in regard to her toes unfortunately she has a lot of callus buildup. Its  been almost a month since I last saw her. Obviously in this amount of time she has built up quite a bit of callus she was sick that is the reason she did not make it in. Nonetheless I do think that weekly visits is probably can be the best thing is asking to help to allow these areas to heal much more effectively and quickly. Fortunately I do not see any evidence of active infection locally or systemically which is great news. 05/15/2021 upon evaluation today patient appears to be doing well currently in regard to her wound. Overall I think that we are definitely headed in the right direction with regard to the toes. Fortunately I do not see any signs of active infection locally or systemically at this time which is great news. No fevers, chills, nausea, vomiting, or diarrhea. 05/22/2021 upon evaluation today patient appears to be doing well with regard to her wound. She has been tolerating the dressing changes without complication. Fortunately the second toe is healed the first toe on the left foot is still open but does not appear to be doing nearly as poorly at this time. I do think we can perform a little bit of debridement in order to clear away some of the necrotic debris patient is in agreement with that plan. 05-29-2021 upon evaluation today patient appears to be doing worse currently in regard to the pain experience. Her leg is still very red in fact I think is a little bit worse than last week unfortunately. Last week I was even concerned about a little bit of cellulitis she really felt like it was more related to her swelling but again I am not certain that that is necessarily the case. Fortunately I do not see any  signs of active infection at this time systemically though locally I am definitely concerned in this regard. 06-05-2021 upon evaluation today patient appears to be doing well with regard to her toe ulcer this is worse is also not better though. Unfortunately it is hurting more in her leg in general is also bothering her. This is despite being. She is also tolerating Levaquin with making her dizzy. Nonetheless I am not certain its helping after she has been on this week I am not seeing much of the improvement with regard to the cellulitis in her leg. She is also previously been on doxycycline although that was towards the end of February. 06-12-2021 upon evaluation today patient appears to be doing well with regard to her wound. We are definitely seeing signs of improvement with regard to size. With that being said I do not see any evidence of active infection at this time which is great news and very pleased in that regard. She is going require some debridement both for callus as well as clearing away some of the slough and biofilm. 07-03-2021 upon evaluation today patient appears to be doing well with regard to his wound on the foot. With that being said he unfortunately did have some of the padding along the heel that got pushed down and subsequently he tells me that it was feeling like it was bunched up under his foot and the heel location and bothering him over the past couple of days. With that being said that when we remove the cast today unbeknownst to [facility] the padding had been pushed down and the cast saw actually nicked his heel this looks like a very light abrasion. Nonetheless he does not have any pain and there is no  active bleeding at this time. I actually did take a picture to show him as well and we documented it we will see how this looks obviously next week but I think this will be healed by Wednesday. The wound itself looks to be doing much better there is some callus to remove some  slough and biofilm as well. 07-10-2021 upon evaluation today patient appears to be doing well with regard to her wounds. She has been tolerating the dressing changes without complication. Actually I feel like the foot is looking a little better in regard to the great toe. Everything else is doing better as well. I feel like she has been much more careful with that and from the callus standpoint things are greatly improved it also helps that she shows up on a regular basis. With that being said we are going to be seeing her following her MRI that scheduled for next week. 07-24-2021 upon evaluation today patient appears to be doing well with regard to her wound. She actually is shown signs of improvement there are still some callus not nearly as bad as what it is then I think we need to try to see what we can do to get some of this off today but hopefully will continue to show signs of improvement with the current regimen. She has thought about the hyperbarics she tells me there is just really not can be a way for her to get here regularly for that. 07-31-2021 upon evaluation today patient appears to be doing well with regard to her toe ulcer although its not significantly smaller its not worse either. She still has a lot of callus buildup. With that being said I think we need to do something to try to keep pressure off of the toe. I think a bolster underneath the big toe could be beneficial in this regard. 08-14-2021 upon evaluation today patient's wound actually showed signs of some improvement she still had callus but for 2 weeks this was not bad at all compared to what we normally find. I do think we are on the right track here and the good news is she seems to be making some excellent progress in my opinion. 08-21-2021 upon evaluation today patient appears to be doing well with regard to her toe ulcer. Unfortunately her silicone toe spacer was too tight Hasley, Shea A. ([id number]) for her toe by the  time she got home and was already causing her pain. For that reason she has not been wearing that she is putting the rolled up gauze up underneath. Obviously I think this is still a good way to go and better than nothing the biggest thing is that she still is walking without shoes at home I discussed this with her previously but again I have that discussion again today I want her to have shoes on at all times she has been going around just in her "sock feet". 10-02-2021 upon evaluation today patient appears to be doing poorly in regard to her toe. She is actually been in sinus June 30 when I last saw her. She has had a lot of issues with her kidneys and then she also had her little puppy passed away she tells me. Unfortunately I think this has weighed heavily on her. Fortunately there does not appear to be any evidence of active infection at this time which is great news. No fevers, chills, nausea, vomiting, or diarrhea. 10-09-2021 upon evaluation today patient appears to be doing well currently in regard  to her wound. She has been tolerating the dressing changes without complication. Fortunately there does not appear to be any signs of active infection locally or systemically at this time which is great news. No fevers, chills, nausea, vomiting, or diarrhea. 10-16-2021 upon evaluation today patient appears to be doing well with regard to her toe ulcer I really feel like this is showing signs of improvement which is great news. Fortunately there does not appear to be any evidence of infection locally or systemically which is great news. No fevers, chills, nausea, vomiting, or diarrhea. 10-23-2021 upon evaluation today patient's wound is showing signs of still having quite a bit of callus around the edge we are making a bolster to go underneath the toe but she still has quite a bit of drainage noted at this point. Fortunately there does not appear to be any signs of infection. Electronic  Signature(s) Signed: 10/23/2021 10:50:49 AM By: Worthy Keeler PA-C Entered By: Worthy Keeler on 10/23/2021 10:50:48 [name], [name] ([id number]) -------------------------------------------------------------------------------- Physical Exam Details Patient Name: [name] Date of Service: 10/23/2021 9:00 AM Medical Record Number: [id number] Patient Account Number: [id number] Date of Birth/Sex: 15-Feb-1941 (81 y.o. F) Treating RN: Cornell Barman Primary Care Provider: Claris Gower Other Clinician: Massie Kluver Referring Provider: Claris Gower Treating Provider/Extender: Skipper Cliche in Treatment: 46 Constitutional Well-nourished and well-hydrated in no acute distress. Respiratory normal breathing without difficulty. Psychiatric this patient is able to make decisions and demonstrates good insight into disease process. Alert and Oriented x 3. pleasant and cooperative. Notes Patient's wound did require sharp debridement of clearway the callus and slough down to good subcutaneous tissue she tolerated that today without complication and postdebridement wound bed appears to be doing much better which is great news. Overall I am extremely pleased in this regard. Electronic Signature(s) Signed: 10/23/2021 10:51:03 AM By: Worthy Keeler PA-C Entered By: Worthy Keeler on 10/23/2021 10:51:03 [name], [name] ([id number]) -------------------------------------------------------------------------------- Physician Orders Details Patient Name: [name] Date of Service: 10/23/2021 9:00 AM Medical Record Number: [id number] Patient Account Number: [id number] Date of Birth/Sex: 1941/02/01 (81 y.o. 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 o Nurse Visit as needed [employer] o Wash wounds with antibacterial soap and water. o May shower; gently cleanse wound with antibacterial soap, rinse and pat dry prior to dressing wounds o No tub bath. Anesthetic (Use 'Patient Medications' Section for Anesthetic Order Entry) o Lidocaine applied to wound bed Edema Control - Lymphedema / Segmental Compressive Device / Other o Elevate, Exercise Daily and Avoid Standing for Long Periods of Time. o Elevate legs to the level of the heart and pump ankles as often as possible o Elevate leg(s) parallel to the floor when sitting. o DO YOUR BEST to sleep in the bed at night. DO NOT sleep in your recliner. Long hours of sitting in a recliner leads to swelling of the legs and/or potential wounds on your backside. Wound  Treatment Wound #1 - Toe Great Wound Laterality: Left Cleanser: [place] Ancillary Kit - 15 Day Supply (Generic) Every Other Day/30 Days Discharge Instructions: Use supplies as instructed; Kit contains: (15) Saline Bullets; (15) 3x3 Gauze; 15 pr Gloves Cleanser: Normal Saline Every Other Day/30 Days Discharge Instructions: Wash your hands with soap and water. Remove old dressing, discard into plastic bag and place into trash. Cleanse the wound with Normal Saline prior to applying a clean dressing using gauze sponges, not tissues or cotton balls. Do not scrub or use excessive force. Pat dry using gauze sponges, not tissue or cotton balls. Primary Dressing: Hydrofera Blue Ready Transfer Foam, 2.5x2.5 (in/in) (Generic)  Every Other Day/30 Days Discharge Instructions: Apply Hydrofera Blue Ready to wound bed as directed Secondary Dressing: Coverlet Latex-Free Fabric Adhesive Dressings Every Other Day/30 Days Discharge Instructions: 1.5 x 2 Add-Ons: Gauze Non-Bordered 4x4 (in/in) Every Other Day/30 Days Discharge Instructions: Cover with dry gauze, rolled up and placed under left great toe Wound #3 - Lower Leg Wound Laterality: Left, Anterior Cleanser: Normal Saline Discharge Instructions: Wash your hands with soap and water. Remove old dressing, discard into plastic bag and place into trash. Cleanse the wound with Normal Saline prior to applying a clean dressing using gauze sponges, not tissues or cotton balls. Do not scrub or use excessive force. Pat dry using gauze sponges, not tissue or cotton balls. Cleanser: Soap and Water [name], Cheryl A. ([id number]) Discharge Instructions: Gently cleanse wound with antibacterial soap, rinse and pat dry prior to dressing wounds Primary Dressing: Xeroform-HBD 2x2 (in/in) Discharge Instructions: Apply Xeroform-HBD 2x2 (in/in) as directed Secondary Dressing: Coverlet Latex-Free Fabric Adhesive Dressings Discharge Instructions: [place] Electronic Signature(s) Signed: 10/23/2021 12:54:01 PM By: Gretta Cool, BSN, RN, CWS, [name] RN, BSN Signed: 10/23/2021 1:57:25 PM By: Worthy Keeler PA-C Entered By: Gretta Cool, BSN, RN, CWS, [name] on 10/23/2021 09:38:58 [name], [name] ([id number]) -------------------------------------------------------------------------------- Problem List Details Patient Name: [name] Date of Service: 10/23/2021 9:00 AM Medical Record Number: [id number] Patient Account Number: [id number] Date of Birth/Sex: November 07, 1940 (81 y.o. 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 04/17/2021 No Yes Z89.421 Acquired absence of other right toe(s) 04/17/2021 No Yes I10 Essential (primary) hypertension 04/17/2021 No Yes Inactive Problems Resolved Problems Electronic Signature(s) Signed: 10/23/2021 9:12:43 AM By: Worthy Keeler PA-C Entered By: Worthy Keeler on 10/23/2021 09:12:42 [name], [name] ([id number]) -------------------------------------------------------------------------------- Progress Note Details Patient Name: [name] Date of Service: 10/23/2021 9:00 AM Medical Record Number: [id number] Patient Account Number: [id number] Date of Birth/Sex: January 13, 1941 (81 y.o. F) Treating RN: Cornell Barman Primary Care Provider: Claris Gower Other Clinician: Massie Kluver Referring Provider: Claris Gower Treating Provider/Extender: Skipper Cliche in Treatment: 27 Subjective Chief Complaint Information obtained from Patient Multiple toe ulcersations History of Present Illness (HPI) 04/17/2021 upon evaluation patient appears to be doing somewhat poorly currently in regard to her first and second toe of the left foot. She previously has been seen by Dr. Doran Durand she saw him on 31 January he did not feel there was  any evidence of osteomyelitis. He did give her a thorough evaluation including x-rays and showed no abnormal findings according to notes. With that being said he felt like that wound care would be beneficial therefore she contacted [place]. She has currently been using antibiotic ointment and has noted that this wound on the great toe has been there for about a year. She is not certain whether there is  anything on the second toe or not she has not really noted any drainage but at the same time she cannot be sure there is nothing hiding up underneath a significant callus here. Her feet really do not have any major complications with regard to pain and that is good news. She does have neuropathy. Patient does have a history of diabetes mellitus type 2, hypertension, and her most recent hemoglobin A1c was 7.6 on June 2022. 05/08/2021 upon evaluation today patient appears to be doing poorly in regard to her toes unfortunately she has a lot of callus buildup. Its been almost a month since I last saw her. Obviously in this amount of time she has built up quite a bit of callus she was sick that is the reason she did not make it in. Nonetheless I do think that weekly visits is probably can be the best thing is asking to help to allow these areas to heal much more effectively and quickly. Fortunately I do not see any evidence of active infection locally or systemically which is great news. 05/15/2021 upon evaluation today patient appears to be doing well currently in regard to her wound. Overall I think that we are definitely headed in the right direction with regard to the toes. Fortunately I do not see any signs of active infection locally or systemically at this time which is great news. No fevers, chills, nausea, vomiting, or diarrhea. 05/22/2021 upon evaluation today patient appears to be doing well with regard to her wound. She has been tolerating the dressing changes without complication. Fortunately the second toe is healed the first toe on the left foot is still open but does not appear to be doing nearly as poorly at this time. I do think we can perform a little bit of debridement in order to clear away some of the necrotic debris patient is in agreement with that plan. 05-29-2021 upon evaluation today patient appears to be doing worse currently in regard to the pain experience. Her leg is still very  red in fact I think is a little bit worse than last week unfortunately. Last week I was even concerned about a little bit of cellulitis she really felt like it was more related to her swelling but again I am not certain that that is necessarily the case. Fortunately I do not see any signs of active infection at this time systemically though locally I am definitely concerned in this regard. 06-05-2021 upon evaluation today patient appears to be doing well with regard to her toe ulcer this is worse is also not better though. Unfortunately it is hurting more in her leg in general is also bothering her. This is despite being. She is also tolerating Levaquin with making her dizzy. Nonetheless I am not certain its helping after she has been on this week I am not seeing much of the improvement with regard to the cellulitis in her leg. She is also previously been on doxycycline although that was towards the end of February. 06-12-2021 upon evaluation today patient appears to be doing well with regard to her  wound. We are definitely seeing signs of improvement with regard to size. With that being said I do not see any evidence of active infection at this time which is great news and very pleased in that regard. She is going require some debridement both for callus as well as clearing away some of the slough and biofilm. 07-03-2021 upon evaluation today patient appears to be doing well with regard to his wound on the foot. With that being said he unfortunately did have some of the padding along the heel that got pushed down and subsequently he tells me that it was feeling like it was bunched up under his foot and the heel location and bothering him over the past couple of days. With that being said that when we remove the cast today unbeknownst to [facility] the padding had been pushed down and the cast saw actually nicked his heel this looks like a very light abrasion. Nonetheless he does not have any pain and there  is no active bleeding at this time. I actually did take a picture to show him as well and we documented it we will see how this looks obviously next week but I think this will be healed by Wednesday. The wound itself looks to be doing much better there is some callus to remove some slough and biofilm as well. 07-10-2021 upon evaluation today patient appears to be doing well with regard to her wounds. She has been tolerating the dressing changes without complication. Actually I feel like the foot is looking a little better in regard to the great toe. Everything else is doing better as well. I feel like she has been much more careful with that and from the callus standpoint things are greatly improved it also helps that she shows up on a regular basis. With that being said we are going to be seeing her following her MRI that scheduled for next week. 07-24-2021 upon evaluation today patient appears to be doing well with regard to her wound. She actually is shown signs of improvement there are still some callus not nearly as bad as what it is then I think we need to try to see what we can do to get some of this off today but hopefully will continue to show signs of improvement with the current regimen. She has thought about the hyperbarics she tells me there is just really not can be a way for her to get here regularly for that. 07-31-2021 upon evaluation today patient appears to be doing well with regard to her toe ulcer although its not significantly smaller its not worse either. She still has a lot of callus buildup. With that being said I think we need to do something to try to keep pressure off of the toe. I think a bolster underneath the big toe could be beneficial in this regard. [name], [name] ([id number]) 08-14-2021 upon evaluation today patient's wound actually showed signs of some improvement she still had callus but for 2 weeks this was not bad at all compared to what we normally find. I do think  we are on the right track here and the good news is she seems to be making some excellent progress in my opinion. 08-21-2021 upon evaluation today patient appears to be doing well with regard to her toe ulcer. Unfortunately her silicone toe spacer was too tight for her toe by the time she got home and was already causing her pain. For that reason she has not been wearing  that she is putting the rolled up gauze up underneath. Obviously I think this is still a good way to go and better than nothing the biggest thing is that she still is walking without shoes at home I discussed this with her previously but again I have that discussion again today I want her to have shoes on at all times she has been going around just in her "sock feet". 10-02-2021 upon evaluation today patient appears to be doing poorly in regard to her toe. She is actually been in sinus June 30 when I last saw her. She has had a lot of issues with her kidneys and then she also had her little puppy passed away she tells me. Unfortunately I think this has weighed heavily on her. Fortunately there does not appear to be any evidence of active infection at this time which is great news. No fevers, chills, nausea, vomiting, or diarrhea. 10-09-2021 upon evaluation today patient appears to be doing well currently in regard to her wound. She has been tolerating the dressing changes without complication. Fortunately there does not appear to be any signs of active infection locally or systemically at this time which is great news. No fevers, chills, nausea, vomiting, or diarrhea. 10-16-2021 upon evaluation today patient appears to be doing well with regard to her toe ulcer I really feel like this is showing signs of improvement which is great news. Fortunately there does not appear to be any evidence of infection locally or systemically which is great news. No fevers, chills, nausea, vomiting, or diarrhea. 10-23-2021 upon evaluation today patient's  wound is showing signs of still having quite a bit of callus around the edge we are making a bolster to go underneath the toe but she still has quite a bit of drainage noted at this point. Fortunately there does not appear to be any signs of infection. Objective Constitutional Well-nourished and well-hydrated in no acute distress. Vitals Time Taken: 9:16 AM, Height: 61 in, Weight: 250 lbs, BMI: 47.2, Temperature: 97.6 F, Pulse: 80 bpm, Respiratory Rate: 18 breaths/min, Blood Pressure: 174/66 mmHg. Respiratory normal breathing without difficulty. Psychiatric this patient is able to make decisions and demonstrates good insight into disease process. Alert and Oriented x 3. pleasant and cooperative. General Notes: Patient's wound did require sharp debridement of clearway the callus and slough down to good subcutaneous tissue she tolerated that today without complication and postdebridement wound bed appears to be doing much better which is great news. Overall I am extremely pleased in this regard. Integumentary (Hair, Skin) Wound #1 status is Open. Original cause of wound was Gradually Appeared. The date acquired was: 02/23/2020. The wound has been in treatment 27 weeks. The wound is located on the Left Toe Great. The wound measures 0.3cm length x 0.6cm width x 0.2cm depth; 0.141cm^2 area and 0.028cm^3 volume. There is Fat Layer (Subcutaneous Tissue) exposed. There is no tunneling or undermining noted. There is a medium amount of serosanguineous drainage noted. The wound margin is thickened. There is medium (34-66%) pink granulation within the wound bed. There is a medium (34-66%) amount of necrotic tissue within the wound bed including Adherent Slough. Wound #3 status is Open. Original cause of wound was Blister. The date acquired was: 10/15/2021. The wound has been in treatment 1 weeks. The wound is located on the Left,Anterior Lower Leg. The wound measures 0.1cm length x 0.1cm width x 0.1cm depth;  0.008cm^2 area and 0.001cm^3 volume. There is Fat Layer (Subcutaneous Tissue) exposed. There is no  tunneling or undermining noted. There is a small amount of serosanguineous drainage noted. There is no granulation within the wound bed. There is no necrotic tissue within the wound bed. 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 There was a Excisional Skin/Subcutaneous Tissue Debridement with a total area of 1 sq cm performed by Tommie Sams., PA-C. With the following instrument(s): Curette to remove Viable and Non-Viable tissue/material. Material removed includes Callus, Subcutaneous Tissue, and Slough. A time out was conducted at 09:34, prior to the start of the procedure. A Minimum amount of bleeding was controlled with Pressure. The procedure was tolerated well. Post Debridement Measurements: 0.3cm length x 0.6cm width x 0.3cm depth; 0.042cm^3 volume. Character of Wound/Ulcer Post Debridement is stable. Severity of Tissue Post Debridement is: Fat layer exposed. Post procedure Diagnosis Wound #1: Same as Pre-Procedure Plan Follow-up Appointments: Return Appointment in 1 week. Nurse Visit as needed Bathing/ Shower/ Hygiene: Wash wounds with antibacterial soap and water. May shower; gently cleanse wound with antibacterial soap, rinse and pat dry prior to dressing wounds No tub bath. Anesthetic (Use 'Patient Medications' Section for Anesthetic Order Entry): Lidocaine  applied to wound bed Edema Control - Lymphedema / Segmental Compressive Device / Other: Elevate, Exercise Daily and Avoid Standing for Long Periods of Time. Elevate legs to the level of the heart and pump ankles as often as possible Elevate leg(s) parallel to the floor when sitting. DO YOUR BEST to sleep in the bed at night. DO NOT sleep in your recliner. Long hours of sitting in a recliner leads to swelling of the legs and/or potential wounds on your backside. WOUND #1: - Toe Great Wound Laterality: Left Cleanser: [place] Ancillary Kit - 15 Day Supply (Generic) Every Other Day/30 Days Discharge Instructions: Use supplies as instructed; Kit contains: (15) Saline Bullets; (15) 3x3 Gauze; 15 pr Gloves Cleanser: Normal Saline Every Other Day/30 Days Discharge Instructions: Wash your hands with soap and water. Remove old dressing, discard into plastic bag and place into trash. Cleanse the wound with Normal Saline prior to applying a clean dressing using gauze sponges, not tissues or cotton balls. Do not scrub or use excessive force. Pat dry using gauze sponges, not tissue or cotton balls. Primary Dressing: Hydrofera Blue Ready Transfer Foam, 2.5x2.5 (in/in) (Generic) Every Other Day/30 Days Discharge Instructions: Apply Hydrofera Blue Ready to wound bed as directed Secondary Dressing: Coverlet Latex-Free Fabric Adhesive Dressings Every Other Day/30 Days Discharge Instructions: 1.5 x 2 Add-Ons: Gauze Non-Bordered 4x4 (in/in) Every Other Day/30 Days Discharge Instructions: Cover with dry gauze, rolled up and placed under left great toe WOUND #3: - Lower Leg Wound Laterality: Left, Anterior Cleanser: Normal Saline Discharge Instructions: Wash your hands with soap and water. Remove old dressing, discard into plastic bag and place into trash. Cleanse the wound with Normal Saline prior to applying a clean dressing using gauze sponges, not tissues or cotton balls. Do not scrub or use excessive force.  Pat dry using gauze sponges, not tissue or cotton balls. Cleanser: Soap and Water Discharge Instructions:  Gently cleanse wound with antibacterial soap, rinse and pat dry prior to dressing wounds Primary Dressing: Xeroform-HBD 2x2 (in/in) Discharge Instructions: Apply Xeroform-HBD 2x2 (in/in) as directed Secondary Dressing: Coverlet Latex-Free Fabric Adhesive Dressings Discharge Instructions: Knuckle 1. Would recommend that we going continue with the wound care measures as before and the patient is in agreement with plan this includes the use of the [facility] were also using the post underneath the toe. If this continues to look like it is more stalled out then we will subsequently discontinued the [facility] and lieu of something else. 2. Also can recommend the Xeroform be continued for the leg I think this is primarily almost healed but I do think that we want protect this for at least 1 more week. Roebuck, [name] ([id number]) We will see patient back for reevaluation in 1 week here in the clinic. If anything worsens or changes patient will contact our office for additional recommendations. Electronic Signature(s) Signed: 10/23/2021 10:51:34 AM By: Worthy Keeler PA-C Entered By: Worthy Keeler on 10/23/2021 10:51:34 Bulman, [name] ([id number]) -------------------------------------------------------------------------------- SuperBill Details Patient Name: [name] Date of Service: 10/23/2021 Medical Record Number: [id number] Patient Account Number: [id number] Date of Birth/Sex: 05/31/1940 (81 y.o. F) Treating RN: Cornell Barman Primary Care Provider: Claris Gower Other Clinician: Massie Kluver Referring Provider: Claris Gower Treating Provider/Extender: Skipper Cliche in Treatment: 27 Diagnosis Coding ICD-10 Codes Code Description E11.621 Type 2 diabetes mellitus with foot ulcer L97.522 Non-pressure chronic ulcer of other part of left foot with fat layer  exposed L97.822 Non-pressure chronic ulcer of other part of left lower leg with fat layer exposed Z89.411 Acquired absence of right great toe Z89.421 Acquired absence of other right toe(s) I10 Essential (primary) hypertension Facility Procedures CPT4 Code: 56314970 Description: 26378 - DEB SUBQ TISSUE 20 SQ CM/< Modifier: Quantity: 1 CPT4 Code: Description: ICD-10 Diagnosis Description L97.522 Non-pressure chronic ulcer of other part of left foot with fat layer exp Modifier: osed Quantity: Physician Procedures CPT4 Code: 5885027 Description: 11042 - WC PHYS SUBQ TISS 20 SQ CM Modifier: Quantity: 1 CPT4 Code: Description: ICD-10 Diagnosis Description L97.522 Non-pressure chronic ulcer of other part of left foot with fat layer exp Modifier: osed Quantity: Electronic Signature(s) Signed: 10/23/2021 10:51:53 AM By: Worthy Keeler PA-C Entered By: Worthy Keeler on 10/23/2021 10:51:53

## 2021-10-30 ENCOUNTER — Encounter: Payer: Medicare Other | Admitting: Physician Assistant

## 2021-10-30 DIAGNOSIS — E11621 Type 2 diabetes mellitus with foot ulcer: Secondary | ICD-10-CM | POA: Diagnosis not present

## 2021-10-30 NOTE — Progress Notes (Addendum)
[name], [name] ([id number]) Visit Report for 10/30/2021 Chief Complaint Document Details Patient Name: [name], [name] Date of Service: 10/30/2021 9:00 AM Medical Record Number: [id number] Patient Account Number: [id number] Date of Birth/Sex: April 07, 1940 (81 y.o. F) Treating RN: Cornell Barman Primary Care Provider: Claris Gower Other Clinician: Massie Kluver Referring Provider: Claris Gower Treating Provider/Extender: Skipper Cliche in Treatment: 28 Information Obtained from: Patient Chief Complaint Multiple toe ulcersations Electronic Signature(s) Signed: 10/30/2021 9:19:51 AM By: Worthy Keeler PA-C Entered By: Worthy Keeler on 10/30/2021 09:19:51 [name], [name] ([id number]) -------------------------------------------------------------------------------- Debridement Details Patient Name: [name] Date of Service: 10/30/2021 9:00 AM Medical Record Number: [id number] Patient Account Number: [id number] Date of Birth/Sex: 08-05-1940 (81 y.o. F) Treating RN: Cornell Barman Primary Care Provider: Claris Gower Other Clinician: Massie Kluver Referring Provider: Claris Gower Treating Provider/Extender: Skipper Cliche in Treatment: 28 Debridement Performed for Wound #1 Left Toe Great Assessment: Performed By: Physician Tommie Sams., PA-C Debridement Type: Debridement Severity of Tissue Pre Debridement: Fat layer exposed Level of Consciousness (Pre- Awake and Alert procedure): Pre-procedure Verification/Time Out Yes - 09:25 Taken: Start Time: 09:25 Total Area Debrided (L x W): 1 (cm) x 1 (cm) = 1 (cm) Tissue and other material Viable, Non-Viable, Callus, Slough, Subcutaneous, Slough debrided: Level: Skin/Subcutaneous Tissue Debridement Description: Excisional Instrument: Curette Bleeding: Minimum Hemostasis Achieved: Pressure End Time: 09:28 Response to Treatment: Procedure was tolerated well Level of Consciousness (Post- Awake and Alert procedure): Post  Debridement Measurements of Total Wound Length: (cm) 0.6 Width: (cm) 0.6 Depth: (cm) 0.2 Volume: (cm) 0.057 Character of Wound/Ulcer Post Debridement: Stable Severity of Tissue Post Debridement: Fat layer exposed Post Procedure Diagnosis Same as Pre-procedure Electronic Signature(s) Signed: 10/30/2021 9:53:49 AM By: Worthy Keeler PA-C Signed: 10/30/2021 1:46:23 PM By: Gretta Cool, BSN, RN, CWS, [name] RN, BSN Entered By: Worthy Keeler on 10/30/2021 09:53:49 [name], [name] ([id number]) -------------------------------------------------------------------------------- HPI Details Patient Name: [name] Date of Service: 10/30/2021 9:00 AM Medical Record Number: [id number] Patient Account Number: [id number] Date of Birth/Sex: 10-Feb-1941 (81 y.o. F) Treating RN: Cornell Barman Primary Care Provider: Claris Gower Other Clinician: Massie Kluver Referring Provider: Claris Gower Treating Provider/Extender: Skipper Cliche in Treatment: 28 History of Present Illness HPI Description: 04/17/2021 upon evaluation patient appears to be doing somewhat poorly currently in regard to her first and second toe of the left foot. She previously has been seen by Dr. Doran Durand she saw him on 31 January he did not feel there was any evidence of osteomyelitis. He did give her a thorough evaluation including x-rays and showed no abnormal findings according to notes. With that being said he felt like that wound care would be beneficial therefore she contacted [place]. She has currently been using antibiotic ointment and has noted that this wound on the great toe has been there for about a year. She is not certain whether there is anything on the second toe or not she has not really noted any drainage but at the same time she cannot be sure there is nothing hiding up underneath a significant callus here. Her feet really do not have any major complications with regard to pain and that is good news. She does have  neuropathy. Patient does have a history of diabetes mellitus type 2, hypertension, and her most recent hemoglobin A1c was 7.6 on June 2022. 05/08/2021 upon evaluation today patient appears to be doing poorly in regard to her toes unfortunately she has a lot of callus buildup. Its been almost  a month since I last saw her. Obviously in this amount of time she has built up quite a bit of callus she was sick that is the reason she did not make it in. Nonetheless I do think that weekly visits is probably can be the best thing is asking to help to allow these areas to heal much more effectively and quickly. Fortunately I do not see any evidence of active infection locally or systemically which is great news. 05/15/2021 upon evaluation today patient appears to be doing well currently in regard to her wound. Overall I think that we are definitely headed in the right direction with regard to the toes. Fortunately I do not see any signs of active infection locally or systemically at this time which is great news. No fevers, chills, nausea, vomiting, or diarrhea. 05/22/2021 upon evaluation today patient appears to be doing well with regard to her wound. She has been tolerating the dressing changes without complication. Fortunately the second toe is healed the first toe on the left foot is still open but does not appear to be doing nearly as poorly at this time. I do think we can perform a little bit of debridement in order to clear away some of the necrotic debris patient is in agreement with that plan. 05-29-2021 upon evaluation today patient appears to be doing worse currently in regard to the pain experience. Her leg is still very red in fact I think is a little bit worse than last week unfortunately. Last week I was even concerned about a little bit of cellulitis she really felt like it was more related to her swelling but again I am not certain that that is necessarily the case. Fortunately I do not see any  signs of active infection at this time systemically though locally I am definitely concerned in this regard. 06-05-2021 upon evaluation today patient appears to be doing well with regard to her toe ulcer this is worse is also not better though. Unfortunately it is hurting more in her leg in general is also bothering her. This is despite being. She is also tolerating Levaquin with making her dizzy. Nonetheless I am not certain its helping after she has been on this week I am not seeing much of the improvement with regard to the cellulitis in her leg. She is also previously been on doxycycline although that was towards the end of February. 06-12-2021 upon evaluation today patient appears to be doing well with regard to her wound. We are definitely seeing signs of improvement with regard to size. With that being said I do not see any evidence of active infection at this time which is great news and very pleased in that regard. She is going require some debridement both for callus as well as clearing away some of the slough and biofilm. 07-03-2021 upon evaluation today patient appears to be doing well with regard to his wound on the foot. With that being said he unfortunately did have some of the padding along the heel that got pushed down and subsequently he tells me that it was feeling like it was bunched up under his foot and the heel location and bothering him over the past couple of days. With that being said that when we remove the cast today unbeknownst to [facility] the padding had been pushed down and the cast saw actually nicked his heel this looks like a very light abrasion. Nonetheless he does not have any pain and there is no active bleeding  at this time. I actually did take a picture to show him as well and we documented it we will see how this looks obviously next week but I think this will be healed by Wednesday. The wound itself looks to be doing much better there is some callus to remove some  slough and biofilm as well. 07-10-2021 upon evaluation today patient appears to be doing well with regard to her wounds. She has been tolerating the dressing changes without complication. Actually I feel like the foot is looking a little better in regard to the great toe. Everything else is doing better as well. I feel like she has been much more careful with that and from the callus standpoint things are greatly improved it also helps that she shows up on a regular basis. With that being said we are going to be seeing her following her MRI that scheduled for next week. 07-24-2021 upon evaluation today patient appears to be doing well with regard to her wound. She actually is shown signs of improvement there are still some callus not nearly as bad as what it is then I think we need to try to see what we can do to get some of this off today but hopefully will continue to show signs of improvement with the current regimen. She has thought about the hyperbarics she tells me there is just really not can be a way for her to get here regularly for that. 07-31-2021 upon evaluation today patient appears to be doing well with regard to her toe ulcer although its not significantly smaller its not worse either. She still has a lot of callus buildup. With that being said I think we need to do something to try to keep pressure off of the toe. I think a bolster underneath the big toe could be beneficial in this regard. 08-14-2021 upon evaluation today patient's wound actually showed signs of some improvement she still had callus but for 2 weeks this was not bad at all compared to what we normally find. I do think we are on the right track here and the good news is she seems to be making some excellent progress in my opinion. 08-21-2021 upon evaluation today patient appears to be doing well with regard to her toe ulcer. Unfortunately her silicone toe spacer was too tight Weisbecker, Janyce A. ([id number]) for her toe by the  time she got home and was already causing her pain. For that reason she has not been wearing that she is putting the rolled up gauze up underneath. Obviously I think this is still a good way to go and better than nothing the biggest thing is that she still is walking without shoes at home I discussed this with her previously but again I have that discussion again today I want her to have shoes on at all times she has been going around just in her "sock feet". 10-02-2021 upon evaluation today patient appears to be doing poorly in regard to her toe. She is actually been in sinus June 30 when I last saw her. She has had a lot of issues with her kidneys and then she also had her little puppy passed away she tells me. Unfortunately I think this has weighed heavily on her. Fortunately there does not appear to be any evidence of active infection at this time which is great news. No fevers, chills, nausea, vomiting, or diarrhea. 10-09-2021 upon evaluation today patient appears to be doing well currently in regard to her  wound. She has been tolerating the dressing changes without complication. Fortunately there does not appear to be any signs of active infection locally or systemically at this time which is great news. No fevers, chills, nausea, vomiting, or diarrhea. 10-16-2021 upon evaluation today patient appears to be doing well with regard to her toe ulcer I really feel like this is showing signs of improvement which is great news. Fortunately there does not appear to be any evidence of infection locally or systemically which is great news. No fevers, chills, nausea, vomiting, or diarrhea. 10-23-2021 upon evaluation today patient's wound is showing signs of still having quite a bit of callus around the edge we are making a bolster to go underneath the toe but she still has quite a bit of drainage noted at this point. Fortunately there does not appear to be any signs of infection. 10-30-2021 upon evaluation  today patient appears to be doing well currently in regard to her wound. She has been tolerating the dressing changes without complication. Fortunately there does not appear to be any evidence of active infection locally or systemically at this time. No fevers, chills, nausea, vomiting, or diarrhea. Electronic Signature(s) Signed: 10/30/2021 9:47:51 AM By: Worthy Keeler PA-C Entered By: Worthy Keeler on 10/30/2021 09:47:51 [name], [name] ([id number]) -------------------------------------------------------------------------------- Physical Exam Details Patient Name: [name] Date of Service: 10/30/2021 9:00 AM Medical Record Number: [id number] Patient Account Number: [id number] Date of Birth/Sex: Sep 25, 1940 (81 y.o. F) Treating RN: Cornell Barman Primary Care Provider: Claris Gower Other Clinician: Massie Kluver Referring Provider: Claris Gower Treating Provider/Extender: Skipper Cliche in Treatment: 2 Constitutional Well-nourished and well-hydrated in no acute distress. Respiratory normal breathing without difficulty. Psychiatric this patient is able to make decisions and demonstrates good insight into disease process. Alert and Oriented x 3. pleasant and cooperative. Notes Upon inspection patient's wound bed showed signs of good granulation and epithelization at this point. Fortunately I see no evidence of infection I did perform debridement of clearway some of the necrotic debris. She tolerated that without complication and postdebridement wound bed appears to be doing much better which is great news. Electronic Signature(s) Signed: 10/30/2021 9:48:12 AM By: Worthy Keeler PA-C Entered By: Worthy Keeler on 10/30/2021 09:48:12 [name], [name] ([id number]) -------------------------------------------------------------------------------- Physician Orders Details Patient Name: [name] Date of Service: 10/30/2021 9:00 AM Medical Record Number: [id number] Patient  Account Number: [id number] Date of Birth/Sex: May 21, 1940 (81 y.o. 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 o Nurse Visit as needed [employer] o Wash wounds with antibacterial soap and water. o May shower; gently cleanse wound with antibacterial soap, rinse and pat dry prior to dressing wounds o No tub bath. Anesthetic (Use 'Patient Medications' Section for Anesthetic Order Entry) o Lidocaine applied to wound bed Edema Control - Lymphedema / Segmental Compressive Device / Other o Elevate, Exercise Daily and Avoid Standing for Long Periods of Time. o Elevate legs to the level of  the heart and pump ankles as often as possible o Elevate leg(s) parallel to the floor when sitting. o DO YOUR BEST to sleep in the bed at night. DO NOT sleep in your recliner. Long hours of sitting in a recliner leads to swelling of the legs and/or potential wounds on your backside. Wound Treatment Wound #1 - Toe Great Wound Laterality: Left Cleanser: [place] Ancillary Kit - 15 Day Supply (Generic) Every Other Day/30 Days Discharge Instructions: Use supplies as instructed; Kit contains: (15) Saline Bullets; (15) 3x3 Gauze; 15 pr Gloves Cleanser: Normal Saline Every Other Day/30 Days Discharge Instructions: Wash your hands with soap and water. Remove old  dressing, discard into plastic bag and place into trash. Cleanse the wound with Normal Saline prior to applying a clean dressing using gauze sponges, not tissues or cotton balls. Do not scrub or use excessive force. Pat dry using gauze sponges, not tissue or cotton balls. Primary Dressing: Hydrofera Blue Ready Transfer Foam, 2.5x2.5 (in/in) (Generic) Every Other Day/30 Days Discharge Instructions: Apply Hydrofera Blue Ready to wound bed as directed Secondary Dressing: Coverlet Latex-Free Fabric Adhesive Dressings Every Other Day/30 Days Discharge Instructions: 1.5 x 2 Add-Ons: Gauze Non-Bordered 4x4 (in/in) Every Other Day/30 Days Discharge Instructions: Cover with dry gauze, rolled up and placed under left great toe Electronic Signature(s) Signed: 11/06/2021 1:48:50 PM By: Worthy Keeler PA-C Signed: 11/06/2021 2:35:40 PM By: Gretta Cool, BSN, RN, CWS, [name] RN, BSN Previous Signature: 10/30/2021 12:29:30 PM Version By: Worthy Keeler PA-C Previous Signature: 10/30/2021 12:49:02 PM Version By: Massie Kluver [name], [name] ([id number]) Entered By: Gretta Cool, BSN, RN, CWS, [name] on 11/06/2021 09:02:13 [name], [name] ([id number]) -------------------------------------------------------------------------------- Problem List Details Patient Name: [name] Date of Service: 10/30/2021 9:00 AM Medical Record Number: [id number] Patient Account Number: [id number] Date of Birth/Sex: 1940-10-12 (81 y.o. 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 04/17/2021 No Yes Z89.421 Acquired absence of other right toe(s) 04/17/2021 No Yes I10 Essential (primary) hypertension 04/17/2021 No Yes Inactive Problems Resolved Problems Electronic Signature(s) Signed: 10/30/2021 9:19:48 AM By: Worthy Keeler PA-C Entered By: Worthy Keeler on 10/30/2021 09:19:48 [name], [name] ([id number]) -------------------------------------------------------------------------------- Progress Note Details Patient Name: [name] Date of Service: 10/30/2021 9:00 AM Medical Record Number: [id number] Patient Account Number: [id number] Date of Birth/Sex: 23-Nov-1940 (81 y.o. F) Treating RN: Cornell Barman Primary Care Provider: Claris Gower Other Clinician: Massie Kluver Referring Provider: Claris Gower Treating Provider/Extender: Skipper Cliche in Treatment: 28 Subjective Chief Complaint Information obtained from Patient Multiple toe ulcersations History of Present Illness (HPI) 04/17/2021 upon evaluation patient appears to be doing somewhat poorly currently in regard to her first and second toe of the left foot. She previously has been seen by Dr. Doran Durand she saw him on 31 January he did not feel there was any evidence of osteomyelitis. He did give her a thorough evaluation including x-rays and showed no abnormal findings according to notes. With that being said he felt like that wound care would be beneficial therefore she contacted  [place]. She has currently been using antibiotic ointment and has noted that this wound on the great toe has been there for about a year. She is not certain whether there is anything on the second toe or not she has not really noted any drainage but at the same time she cannot be sure there is nothing hiding up underneath a significant callus here. Her feet really do not have any major complications with regard to pain and that is good news. She does  have neuropathy. Patient does have a history of diabetes mellitus type 2, hypertension, and her most recent hemoglobin A1c was 7.6 on June 2022. 05/08/2021 upon evaluation today patient appears to be doing poorly in regard to her toes unfortunately she has a lot of callus buildup. Its been almost a month since I last saw her. Obviously in this amount of time she has built up quite a bit of callus she was sick that is the reason she did not make it in. Nonetheless I do think that weekly visits is probably can be the best thing is asking to help to allow these areas to heal much more effectively and quickly. Fortunately I do not see any evidence of active infection locally or systemically which is great news. 05/15/2021 upon evaluation today patient appears to be doing well currently in regard to her wound. Overall I think that we are definitely headed in the right direction with regard to the toes. Fortunately I do not see any signs of active infection locally or systemically at this time which is great news. No fevers, chills, nausea, vomiting, or diarrhea. 05/22/2021 upon evaluation today patient appears to be doing well with regard to her wound. She has been tolerating the dressing changes without complication. Fortunately the second toe is healed the first toe on the left foot is still open but does not appear to be doing nearly as poorly at this time. I do think we can perform a little bit of debridement in order to clear away some of the necrotic debris patient is in agreement with that plan. 05-29-2021 upon evaluation today patient appears to be doing worse currently in regard to the pain experience. Her leg is still very red in fact I think is a little bit worse than last week unfortunately. Last week I was even concerned about a little bit of cellulitis she really felt like it was more related to her swelling but again I am not certain that that is necessarily the case. Fortunately I do not see any  signs of active infection at this time systemically though locally I am definitely concerned in this regard. 06-05-2021 upon evaluation today patient appears to be doing well with regard to her toe ulcer this is worse is also not better though. Unfortunately it is hurting more in her leg in general is also bothering her. This is despite being. She is also tolerating Levaquin with making her dizzy. Nonetheless I am not certain its helping after she has been on this week I am not seeing much of the improvement with regard to the cellulitis in her leg. She is also previously been on doxycycline although that was towards the end of February. 06-12-2021 upon evaluation today patient appears to be doing well with regard to her wound. We are definitely seeing signs of improvement with regard to size. With that being said I do not see any evidence of active infection at this time which is great news and very pleased in that  regard. She is going require some debridement both for callus as well as clearing away some of the slough and biofilm. 07-03-2021 upon evaluation today patient appears to be doing well with regard to his wound on the foot. With that being said he unfortunately did have some of the padding along the heel that got pushed down and subsequently he tells me that it was feeling like it was bunched up under his foot and the heel location and bothering him over the past couple of days. With that being said that when we remove the cast today unbeknownst to [facility] the padding had been pushed down and the cast saw actually nicked his heel this looks like a very light abrasion. Nonetheless he does not have any pain and there is no active bleeding at this time. I actually did take a picture to show him as well and we documented it we will see how this looks obviously next week but I think this will be healed by Wednesday. The wound itself looks to be doing much better there is some callus to remove some  slough and biofilm as well. 07-10-2021 upon evaluation today patient appears to be doing well with regard to her wounds. She has been tolerating the dressing changes without complication. Actually I feel like the foot is looking a little better in regard to the great toe. Everything else is doing better as well. I feel like she has been much more careful with that and from the callus standpoint things are greatly improved it also helps that she shows up on a regular basis. With that being said we are going to be seeing her following her MRI that scheduled for next week. 07-24-2021 upon evaluation today patient appears to be doing well with regard to her wound. She actually is shown signs of improvement there are still some callus not nearly as bad as what it is then I think we need to try to see what we can do to get some of this off today but hopefully will continue to show signs of improvement with the current regimen. She has thought about the hyperbarics she tells me there is just really not can be a way for her to get here regularly for that. 07-31-2021 upon evaluation today patient appears to be doing well with regard to her toe ulcer although its not significantly smaller its not worse either. She still has a lot of callus buildup. With that being said I think we need to do something to try to keep pressure off of the toe. I think a bolster underneath the big toe could be beneficial in this regard. [name], [name] ([id number]) 08-14-2021 upon evaluation today patient's wound actually showed signs of some improvement she still had callus but for 2 weeks this was not bad at all compared to what we normally find. I do think we are on the right track here and the good news is she seems to be making some excellent progress in my opinion. 08-21-2021 upon evaluation today patient appears to be doing well with regard to her toe ulcer. Unfortunately her silicone toe spacer was too tight for her toe by the  time she got home and was already causing her pain. For that reason she has not been wearing that she is putting the rolled up gauze up underneath. Obviously I think this is still a good way to go and better than nothing the biggest thing is that she still is walking without shoes at  home I discussed this with her previously but again I have that discussion again today I want her to have shoes on at all times she has been going around just in her "sock feet". 10-02-2021 upon evaluation today patient appears to be doing poorly in regard to her toe. She is actually been in sinus June 30 when I last saw her. She has had a lot of issues with her kidneys and then she also had her little puppy passed away she tells me. Unfortunately I think this has weighed heavily on her. Fortunately there does not appear to be any evidence of active infection at this time which is great news. No fevers, chills, nausea, vomiting, or diarrhea. 10-09-2021 upon evaluation today patient appears to be doing well currently in regard to her wound. She has been tolerating the dressing changes without complication. Fortunately there does not appear to be any signs of active infection locally or systemically at this time which is great news. No fevers, chills, nausea, vomiting, or diarrhea. 10-16-2021 upon evaluation today patient appears to be doing well with regard to her toe ulcer I really feel like this is showing signs of improvement which is great news. Fortunately there does not appear to be any evidence of infection locally or systemically which is great news. No fevers, chills, nausea, vomiting, or diarrhea. 10-23-2021 upon evaluation today patient's wound is showing signs of still having quite a bit of callus around the edge we are making a bolster to go underneath the toe but she still has quite a bit of drainage noted at this point. Fortunately there does not appear to be any signs of infection. 10-30-2021 upon evaluation  today patient appears to be doing well currently in regard to her wound. She has been tolerating the dressing changes without complication. Fortunately there does not appear to be any evidence of active infection locally or systemically at this time. No fevers, chills, nausea, vomiting, or diarrhea. Objective Constitutional Well-nourished and well-hydrated in no acute distress. Vitals Time Taken: 8:56 AM, Height: 61 in, Weight: 250 lbs, BMI: 47.2, Temperature: 98.1 F, Pulse: 81 bpm, Respiratory Rate: 18 breaths/min, Blood Pressure: 157/92 mmHg. Respiratory normal breathing without difficulty. Psychiatric this patient is able to make decisions and demonstrates good insight into disease process. Alert and Oriented x 3. pleasant and cooperative. General Notes: Upon inspection patient's wound bed showed signs of good granulation and epithelization at this point. Fortunately I see no evidence of infection I did perform debridement of clearway some of the necrotic debris. She tolerated that without complication and postdebridement wound bed appears to be doing much better which is great news. Integumentary (Hair, Skin) Wound #1 status is Open. Original cause of wound was Gradually Appeared. The date acquired was: 02/23/2020. The wound has been in treatment 28 weeks. The wound is located on the Left Toe Great. The wound measures 0.6cm length x 0.6cm width x 0.1cm depth; 0.283cm^2 area and 0.028cm^3 volume. There is Fat Layer (Subcutaneous Tissue) exposed. There is a medium amount of serosanguineous drainage noted. The wound margin is thickened. There is medium (34-66%) pink granulation within the wound bed. There is a medium (34-66%) amount of necrotic tissue within the wound bed including Adherent Slough. Wound #3 status is Open. Original cause of wound was Blister. The date acquired was: 10/15/2021. The wound has been in treatment 2 weeks. The wound is located on the Left,Anterior Lower Leg. The  wound measures 0.1cm length x 0.2cm width x 0.1cm depth; 0.016cm^2 area  and 0.002cm^3 volume. There is Fat Layer (Subcutaneous Tissue) exposed. There is a small amount of serosanguineous drainage noted. There is no granulation within the wound bed. There is no necrotic tissue within the wound bed. 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 There was a Excisional Skin/Subcutaneous Tissue Debridement with a total area of 1 sq cm performed by Tommie Sams., PA-C. With the following instrument(s): Curette to remove Viable and Non-Viable tissue/material. Material removed includes Callus, Subcutaneous Tissue, and Slough. A time out was conducted at 09:25, prior to the start of the procedure. A Minimum amount of bleeding was controlled with Pressure. The procedure was tolerated well. Post Debridement Measurements: 0.6cm length x 0.6cm width x 0.2cm depth; 0.057cm^3 volume. Character of Wound/Ulcer Post Debridement is stable. Severity of Tissue Post Debridement is: Fat layer exposed. Post procedure Diagnosis Wound #1: Same as Pre-Procedure Plan Follow-up Appointments: Return Appointment in 1 week. Nurse Visit as needed Bathing/ Shower/ Hygiene: Wash wounds with antibacterial soap and water. May shower; gently cleanse wound with antibacterial soap, rinse and pat dry prior to dressing wounds No tub bath. Anesthetic (Use 'Patient Medications' Section for Anesthetic Order  Entry): Lidocaine applied to wound bed Edema Control - Lymphedema / Segmental Compressive Device / Other: Elevate, Exercise Daily and Avoid Standing for Long Periods of Time. Elevate legs to the level of the heart and pump ankles as often as possible Elevate leg(s) parallel to the floor when sitting. DO YOUR BEST to sleep in the bed at night. DO NOT sleep in your recliner. Long hours of sitting in a recliner leads to swelling of the legs and/or potential wounds on your backside. WOUND #1: - Toe Great Wound Laterality: Left Cleanser: [place]ram Ancillary Kit - 15 Day Supply (Generic) Every Other Day/30 Days Discharge Instructions: Use supplies as instructed; Kit contains: (15) Saline Bullets; (15) 3x3 Gauze; 15 pr Gloves Cleanser: Normal Saline Every Other Day/30 Days Discharge Instructions: Wash your hands with soap and water. Remove old dressing, discard into plastic bag and place into trash. Cleanse the wound with Normal Saline prior to applying a clean dressing using gauze sponges, not tissues or cotton balls. Do not scrub or use excessive force. Pat dry using gauze sponges, not tissue or cotton balls. Primary Dressing: Hydrofera Blue Ready Transfer Foam, 2.5x2.5 (in/in) (Generic) Every Other Day/30 Days Discharge Instructions: Apply Hydrofera Blue Ready to wound bed as directed Secondary Dressing: Coverlet Latex-Free Fabric Adhesive Dressings Every Other Day/30 Days Discharge Instructions: 1.5 x 2 Add-Ons: Gauze Non-Bordered 4x4 (in/in) Every Other Day/30 Days Discharge Instructions: Cover with dry gauze, rolled up and placed under left great toe WOUND #3: - Lower Leg Wound Laterality: Left, Anterior Cleanser: Normal Saline Discharge Instructions: Wash your hands with soap and water. Remove old dressing, discard into plastic bag and place into trash. Cleanse the wound with Normal Saline prior to applying a clean dressing using gauze sponges, not tissues or cotton balls. Do not scrub or  use excessive force. Pat dry using gauze sponges, not tissue or cotton balls.  Cleanser: Soap and Water Discharge Instructions: Gently cleanse wound with antibacterial soap, rinse and pat dry prior to dressing wounds Primary Dressing: Xeroform-HBD 2x2 (in/in) Discharge Instructions: Apply Xeroform-HBD 2x2 (in/in) as directed Secondary Dressing: Coverlet Latex-Free Fabric Adhesive Dressings Discharge Instructions: Knuckle 1. Based on what I am seeing I do think that we should check on a couple things here. First and foremost I want to make sure that we have good arterial flow and I discussed that with the patient today. I Georgina Peer go ahead and see about making a referral to vascular in order to evaluate and check the patient's blood flow and she is in agreement with that plan. [name], [name] ([id number]) 2. I am also going to recommend that we go ahead and continue with the [facility] at this point which I think is still a good option here. 3. I am also can recommend that we have the patient continue with the Xeroform gauze dressing for her leg which I think should do quite well as well. We will see patient back for reevaluation in 1 week here in the clinic. If anything worsens or changes patient will contact our office for additional recommendations. Once we get the results back for a culture if indeed this is showing good signs of blood flow I think that potentially looking toward a total contact cast may be the way to go. Electronic Signature(s) Signed: 10/30/2021 9:54:02 AM By: Worthy Keeler PA-C Previous Signature: 10/30/2021 9:52:58 AM Version By: Worthy Keeler PA-C Entered By: Worthy Keeler on 10/30/2021 09:54:01 [name], [name] ([id number]) -------------------------------------------------------------------------------- SuperBill Details Patient Name: [name] Date of Service: 10/30/2021 Medical Record Number: [id number] Patient Account Number: [id number] Date of Birth/Sex:  1941/01/03 (81 y.o. F) Treating RN: Cornell Barman Primary Care Provider: Claris Gower Other Clinician: Massie Kluver Referring Provider: Claris Gower Treating Provider/Extender: Skipper Cliche in Treatment: 28 Diagnosis Coding ICD-10 Codes Code Description E11.621 Type 2 diabetes mellitus with foot ulcer L97.522 Non-pressure chronic ulcer of other part of left foot with fat layer exposed L97.822 Non-pressure chronic ulcer of other part of left lower leg with fat layer exposed Z89.411 Acquired absence of right great toe Z89.421 Acquired absence of other right toe(s) I10 Essential (primary) hypertension Facility Procedures CPT4 Code: [id number] Description: 59935 - DEB SUBQ TISSUE 20 SQ CM/< Modifier: Quantity: 1 CPT4 Code: Description: ICD-10 Diagnosis Description L97.522 Non-pressure chronic ulcer of other part of left foot with fat layer exp Modifier: osed Quantity: Physician Procedures CPT4 Code: 7017793 Description: 11042 - WC PHYS SUBQ TISS 20 SQ CM Modifier: Quantity: 1 CPT4 Code: Description: ICD-10 Diagnosis Description L97.522 Non-pressure chronic ulcer of other part of left foot with fat layer exp Modifier: osed Quantity: Electronic Signature(s) Signed: 10/30/2021 9:55:55 AM By: Worthy Keeler PA-C Entered By: Worthy Keeler on 10/30/2021 09:55:55

## 2021-10-30 NOTE — Progress Notes (Signed)
Mullenax, [name] ([id number]) Visit Report for 10/30/2021 Arrival Information Details Patient Name: [name], [name] Date of Service: 10/30/2021 9:00 AM Medical Record Number: [id number] Patient Account Number: [id number] Date of Birth/Sex: 01/28/1941 (81 y.o. F) Treating RN: Cornell Barman Primary Care Deroy Noah: Claris Gower Other Clinician: Massie Kluver Referring Agnes Probert: Claris Gower Treating Omarii Scalzo/Extender: Skipper Cliche in Treatment: 28 Visit Information History Since Last Visit All ordered tests and consults were completed: No Patient Arrived: [name] Added or deleted any medications: No Arrival Time: 08:44 Any new allergies or adverse reactions: No Transfer Assistance: None Had a fall or experienced change in No Patient Requires Transmission-Based Precautions: No activities of daily living that may affect Patient Has Alerts: No risk of falls: Hospitalized since last visit: No Pain Present Now: No Electronic Signature(s) Signed: 10/30/2021 12:49:02 PM By: Massie Kluver Entered By: Massie Kluver on 10/30/2021 08:55:46 Linehan, [name] ([id number]) -------------------------------------------------------------------------------- Clinic Level of Care Assessment Details Patient Name: [name] Date of Service: 10/30/2021 9:00 AM Medical Record Number: [id number] Patient Account Number: [id number] Date of Birth/Sex: 1940-09-02 (81 y.o. F) Treating RN: Cornell Barman Primary Care Sarita Hakanson: Claris Gower Other Clinician: Massie Kluver Referring Issabelle Mcraney: Claris Gower Treating Takaya Hyslop/Extender: Skipper Cliche in Treatment: 28 Clinic Level of Care Assessment Items TOOL 1 Quantity Score _0  - Use when EandM and Procedure is performed on INITIAL visit 0 ASSESSMENTS - Nursing Assessment / Reassessment _1  - General Physical Exam (combine w/ comprehensive assessment (listed just below) when performed on new 0 pt. evals) _2  - 0 Comprehensive Assessment (HX, ROS, Risk  Assessments, Wounds Hx, etc.) ASSESSMENTS - Wound and Skin Assessment / Reassessment _3  - Dermatologic / Skin Assessment (not related to wound area) 0 ASSESSMENTS - Ostomy and/or Continence Assessment and Care _4  - Incontinence Assessment and Management 0 _5  - 0 Ostomy Care Assessment and Management (repouching, etc.) PROCESS - Coordination of Care _6  - Simple Patient / Family Education for ongoing care 0 _7  - 0 Complex (extensive) Patient / Family Education for ongoing care _8  - 0 Staff obtains Programmer, systems, Records, Test Results / Process Orders _9  - 0 Staff telephones HHA, Nursing Homes / Clarify orders / etc _10  - 0 Routine Transfer to another Facility (non-emergent condition) _11  - 0 Routine Hospital Admission (non-emergent condition) _12  - 0 New Admissions / Biomedical engineer / Ordering NPWT, Apligraf, etc. _13  - 0 Emergency Hospital Admission (emergent condition) PROCESS - Special Needs _14  - Pediatric / Minor Patient Management 0 _15  - 0 Isolation Patient Management _16  - 0 Hearing / Language / Visual special needs _17  - 0 Assessment of Community assistance (transportation, D/C planning, etc.) _18  - 0 Additional assistance / Altered mentation _19  - 0 Support Surface(s) Assessment (bed, cushion, seat, etc.) INTERVENTIONS - Miscellaneous _20  - External ear exam 0 _21  - 0 Patient Transfer (multiple staff / Civil Service fast streamer / Similar devices) _22  - 0 Simple Staple / Suture removal (25 or less) _23  - 0 Complex Staple / Suture removal (26 or more) _24  - 0 Hypo/Hyperglycemic Management (do not check if billed separately) _25  - 0 Ankle / Brachial Index (ABI) - do not check if billed separately Has the patient been seen at the hospital within the last three years: Yes Total Score: 0 Level Of Care: ____ [name] ([id number]) Electronic Signature(s) Signed: 10/30/2021 12:49:02 PM By: Massie Kluver Entered By: Massie Kluver on 10/30/2021 09:32:53 Justen, [name]  ([id number]) -------------------------------------------------------------------------------- Encounter Discharge Information Details Patient Name: [name] Date of Service: 10/30/2021 9:00 AM Medical  Record Number: [id number] Patient Account Number: [id number] Date of Birth/Sex: 1941/02/07 (81 y.o. F) Treating RN: Cornell Barman Primary Care Kenyona Rena: Claris Gower Other Clinician: Massie Kluver Referring Grantley Savage: Claris Gower Treating Jocelin Schuelke/Extender: Skipper Cliche in Treatment: 28 Encounter Discharge Information Items Post Procedure Vitals Discharge Condition: Stable Temperature (F): 98.1 Ambulatory Status: Cane Pulse (bpm): 81 Discharge Destination: Home Respiratory Rate (breaths/min): 18 Transportation: Private Auto Blood Pressure (mmHg): 157/92 Accompanied By: self Schedule Follow-up Appointment: Yes Clinical Summary of Care: Electronic Signature(s) Signed: 10/30/2021 12:49:02 PM By: Massie Kluver Entered By: Massie Kluver on 10/30/2021 09:42:27 Nieland, [name] ([id number]) -------------------------------------------------------------------------------- Lower Extremity Assessment Details Patient Name: [name] Date of Service: 10/30/2021 9:00 AM Medical Record Number: [id number] Patient Account Number: [id number] Date of Birth/Sex: 07/04/1940 (81 y.o. F) Treating RN: Cornell Barman Primary Care Kerigan Narvaez: Claris Gower Other Clinician: Massie Kluver Referring Najee Cowens: Claris Gower Treating Taneshia Lorence/Extender: Jeri Cos Weeks in Treatment: 28 Edema Assessment Assessed: Shirlyn Goltz: Yes] [Right: No] Edema: [Left: Ye] [Right: s] Calf Left: Right: Point of Measurement: 39 cm From Medial Instep 40.6 cm Ankle Left: Right: Point of Measurement: 10 cm From Medial Instep 26.5 cm Vascular Assessment Pulses: Dorsalis Pedis Palpable: [Left:Yes] Posterior Tibial Palpable: [Left:Yes] Electronic Signature(s) Signed: 10/30/2021 12:49:02 PM By: Massie Kluver Signed: 10/30/2021 1:46:23 PM By: Gretta Cool, BSN, RN, CWS, [name] RN, BSN Entered By: Massie Kluver on 10/30/2021 09:07:19 Emig, [name] ([id number]) -------------------------------------------------------------------------------- Multi Wound Chart Details Patient Name: [name] Date of Service: 10/30/2021 9:00 AM Medical Record Number: [id number] Patient Account Number: [id number] Date of Birth/Sex: 14-Aug-1940 (81 y.o. 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 Comorbid History: Chronic sinus problems/congestion, Chronic sinus problems/congestion, N/A Anemia, Sleep Apnea, Congestive Anemia, Sleep Apnea, Congestive Heart Failure, Hypertension, Type II Heart Failure, Hypertension, Type II Diabetes, Neuropathy Diabetes, Neuropathy Date Acquired: 02/23/2020 10/15/2021 N/A Weeks of Treatment: 28 2 N/A Wound Status: Open Open N/A Wound Recurrence: No No N/A Measurements L x W x D (cm) 0.6x0.6x0.1 0.1x0.2x0.1 N/A Area (cm) : 0.283 0.016 N/A Volume (cm) : 0.028 0.002 N/A % Reduction in Area: 94.00% 98.80% N/A % Reduction in Volume: 94.10% 98.50% N/A Classification: Grade 1 Full Thickness Without Exposed N/A Support Structures Exudate Amount: Medium Small N/A Exudate Type: Serosanguineous Serosanguineous N/A Exudate Color: red, brown red, brown N/A Wound Margin: Thickened N/A N/A Granulation Amount: Medium (34-66%) None Present (0%) N/A Granulation Quality:  Pink N/A N/A Necrotic Amount: Medium (34-66%) None Present (0%) N/A Exposed Structures: Fat Layer (Subcutaneous Tissue): Fat Layer (Subcutaneous Tissue): N/A Yes Yes Fascia: No Tendon: No Muscle: No Joint: No Bone: No Epithelialization: Small (1-33%) Large (67-100%) N/A Treatment Notes Electronic Signature(s) Signed: 10/30/2021 12:49:02 PM By: Massie Kluver Entered By: Massie Kluver on 10/30/2021 09:07:29 Goshert, [name] ([id number]) Few, [name] ([id number]) -------------------------------------------------------------------------------- Multi-Disciplinary Care Plan Details Patient Name: [name] Date of Service: 10/30/2021 9:00 AM Medical Record Number: [id number] Patient Account Number: [id number] Date of Birth/Sex: Jun 20, 1940 (81 y.o. F) Treating RN: Cornell Barman Primary Care Latajah Thuman: Claris Gower Other Clinician: Massie Kluver Referring Charlita Brian: Claris Gower Treating Taurean Ju/Extender: Skipper Cliche in Treatment: 28 Active  Inactive Wound/Skin Impairment Nursing Diagnoses: Impaired tissue integrity Knowledge deficit related to ulceration/compromised skin integrity Goals: Ulcer/skin breakdown will have a volume reduction of 30% by week 4 Date Initiated: 04/17/2021 Date Inactivated: 07/03/2021 Target Resolution Date: 05/15/2021 Goal Status: Unmet Unmet Reason: comorbities Ulcer/skin breakdown will have a volume reduction of 50% by week 8 Date Initiated: 04/17/2021 Date Inactivated: 07/03/2021 Target Resolution Date: 06/12/2021 Goal Status: Met Ulcer/skin breakdown will have a volume reduction of 80% by week 12 Date Initiated: 04/17/2021 Target Resolution Date: 07/10/2021 Goal Status: Active Ulcer/skin breakdown will heal within 14 weeks Date Initiated: 04/17/2021 Target Resolution Date: 07/24/2021 Goal Status: Active Interventions: Assess patient/caregiver ability to obtain necessary supplies Assess patient/caregiver ability to perform ulcer/skin care  regimen upon admission and as needed Assess ulceration(s) every visit Provide education on ulcer and skin care Notes: Electronic Signature(s) Signed: 10/30/2021 12:49:02 PM By: Massie Kluver Signed: 10/30/2021 1:46:23 PM By: Gretta Cool, BSN, RN, CWS, [name] RN, BSN Entered By: Massie Kluver on 10/30/2021 09:07:23 Bisher, [name] ([id number]) -------------------------------------------------------------------------------- Pain Assessment Details Patient Name: [name] Date of Service: 10/30/2021 9:00 AM Medical Record Number: [id number] Patient Account Number: [id number] Date of Birth/Sex: Jan 15, 1941 (81 y.o. F) Treating RN: Cornell Barman Primary Care Kinslea Frances: Claris Gower Other Clinician: Massie Kluver Referring Aliyyah Riese: Claris Gower Treating Mitsuye Schrodt/Extender: Skipper Cliche in Treatment: 28 Active Problems Location of Pain Severity and Description of Pain Patient Has Paino No Site Locations Pain Management and Medication Current Pain Management: Electronic Signature(s) Signed: 10/30/2021 12:49:02 PM By: Massie Kluver Signed: 10/30/2021 1:46:23 PM By: Gretta Cool, BSN, RN, CWS, [name] RN, BSN Entered By: Massie Kluver on 10/30/2021 08:59:23 Etsitty, [name] ([id number]) -------------------------------------------------------------------------------- Patient/Caregiver Education Details Patient Name: [name] Date of Service: 10/30/2021 9:00 AM Medical Record Number: [id number] Patient Account Number: [id number] Date of Birth/Gender: 1940-06-18 (81 y.o. F) Treating RN: Cornell Barman Primary Care Physician: Claris Gower Other Clinician: Massie Kluver Referring Physician: Claris Gower Treating Physician/Extender: Skipper Cliche in Treatment: 28 Education Assessment Education Provided To: Patient Education Topics Provided Wound/Skin Impairment: Handouts: Other: continue wound care as directed Methods: Explain/Verbal Responses: State content correctly Electronic  Signature(s) Signed: 10/30/2021 12:49:02 PM By: Massie Kluver Entered By: Massie Kluver on 10/30/2021 09:41:38 Barley, [name] ([id number]) -------------------------------------------------------------------------------- Wound Assessment Details Patient Name: [name] Date of Service: 10/30/2021 9:00 AM Medical Record Number: [id number] Patient Account Number: [id number] Date of Birth/Sex: [birth year] (81 y.o. F) Treating RN: Cornell Barman Primary Care Moriah Loughry: Claris Gower Other Clinician: Massie Kluver Referring Lemario Chaikin: Claris Gower Treating Wendal Wilkie/Extender: Skipper Cliche in Treatment: 28 Wound Status Wound Number: 1 Primary Diabetic Wound/Ulcer of the Lower Extremity Etiology: Wound Location: Left Toe Great Wound Open Wounding Event: Gradually Appeared Status: Date Acquired: 02/23/2020 Comorbid Chronic sinus problems/congestion, Anemia, Sleep Apnea, Weeks Of Treatment: 28 History: Congestive Heart Failure, Hypertension, Type II Diabetes, Clustered Wound: No Neuropathy Photos Wound Measurements Length: (cm) 0.6 Width: (cm) 0.6 Depth: (cm) 0.1 Area: (cm) 0.283 Volume: (cm) 0.028 % Reduction in Area: 94% % Reduction in Volume: 94.1% Epithelialization: Small (1-33%) Wound Description Classification: Grade 1 Wound Margin: Thickened Exudate Amount: Medium Exudate Type: Serosanguineous Exudate Color: red, brown Foul Odor After Cleansing: No Slough/Fibrino Yes Wound Bed Granulation Amount: Medium (34-66%) Exposed Structure Granulation Quality: Pink Fat Layer (Subcutaneous Tissue) Exposed: Yes Necrotic Amount: Medium (34-66%) Necrotic Quality: Adherent Slough Treatment Notes Wound #1 (Toe Great) Wound Laterality: Left Cleanser [place] Ancillary Kit - 15 Day Supply Discharge Instruction: Use supplies as instructed; Kit contains: (15) Saline Bullets; (15)  3x3 Gauze; 15 pr Gloves Normal Saline Discharge Instruction: Wash your hands with soap and water.  Remove old dressing, discard into plastic bag and place into trash. Cleanse the wound with Normal Saline prior to applying a clean dressing using gauze sponges, not tissues or cotton balls. Do not scrub or use excessive force. Pat dry using gauze sponges, not tissue or cotton balls. Sabella, [name] ([id number]) Peri-Wound Care Topical Primary Dressing Hydrofera Blue Ready Transfer Foam, 2.5x2.5 (in/in) Discharge Instruction: Apply Hydrofera Blue Ready to wound bed as directed Secondary Dressing Coverlet Latex-Free Fabric Adhesive Dressings Discharge Instruction: 1.5 x 2 Secured With Compression Wrap Compression Stockings Add-Ons Gauze Non-Bordered 4x4 (in/in) Discharge Instruction: Cover with dry gauze, rolled up and placed under left great toe Electronic Signature(s) Signed: 10/30/2021 12:49:02 PM By: Massie Kluver Signed: 10/30/2021 1:46:23 PM By: Gretta Cool, BSN, RN, CWS, [name] RN, BSN Entered By: Massie Kluver on 10/30/2021 09:05:19 Mozer, [name] ([id number]) -------------------------------------------------------------------------------- Wound Assessment Details Patient Name: [name] Date of Service: 10/30/2021 9:00 AM Medical Record Number: [id number] Patient Account Number: [id number] Date of Birth/Sex: 1941/01/02 (81 y.o. F) Treating RN: Cornell Barman Primary Care Tyashia Morrisette: Claris Gower Other Clinician: Massie Kluver Referring Mohid Furuya: Claris Gower Treating Shahzad Thomann/Extender: Jeri Cos Weeks in Treatment: 28 Wound Status Wound Number: 3 Primary Skin Tear Etiology: Wound Location: Left, Anterior Lower Leg Wound Open Wounding Event: Blister Status: Date Acquired: 10/15/2021 Comorbid Chronic sinus problems/congestion, Anemia, Sleep Apnea, Weeks Of Treatment: 2 History: Congestive Heart Failure, Hypertension, Type II Diabetes, Clustered Wound: No Neuropathy Photos Wound Measurements Length: (cm) 0.1 Width: (cm) 0.2 Depth: (cm) 0.1 Area: (cm) 0.016 Volume:  (cm) 0.002 % Reduction in Area: 98.8% % Reduction in Volume: 98.5% Epithelialization: Large (67-100%) Wound Description Classification: Full Thickness Without Exposed Support Structures Exudate Amount: Small Exudate Type: Serosanguineous Exudate Color: red, brown Foul Odor After Cleansing: No Slough/Fibrino No Wound Bed Granulation Amount: None Present (0%) Exposed Structure Necrotic Amount: None Present (0%) Fascia Exposed: No Fat Layer (Subcutaneous Tissue) Exposed: Yes Tendon Exposed: No Muscle Exposed: No Joint Exposed: No Bone Exposed: No Treatment Notes Wound #3 (Lower Leg) Wound Laterality: Left, Anterior Cleanser Normal Saline Discharge Instruction: Wash your hands with soap and water. Remove old dressing, discard into plastic bag and place into trash. Cleanse the wound with Normal Saline prior to applying a clean dressing using gauze sponges, not tissues or cotton balls. Do not scrub or use excessive force. Pat dry using gauze sponges, not tissue or cotton balls. Brouillet, [name] ([id number]) Soap and Water Discharge Instruction: Gently cleanse wound with antibacterial soap, rinse and pat dry prior to dressing wounds Peri-Wound Care Topical Primary Dressing Xeroform-HBD 2x2 (in/in) Discharge Instruction: Apply Xeroform-HBD 2x2 (in/in) as directed Secondary Dressing Coverlet Latex-Free Fabric Adhesive Dressings Discharge Instruction: Knuckle Secured With Compression Wrap Compression Stockings Add-Ons Electronic Signature(s) Signed: 10/30/2021 12:49:02 PM By: Massie Kluver Signed: 10/30/2021 1:46:23 PM By: Gretta Cool, BSN, RN, CWS, [name] RN, BSN Entered By: Massie Kluver on 10/30/2021 09:05:51 Silbaugh, [name] ([id number]) -------------------------------------------------------------------------------- [place] Details Patient Name: [name] Date of Service: 10/30/2021 9:00 AM Medical Record Number: [id number] Patient Account Number: [id number] Date of Birth/Sex:  01/16/1941 (81 y.o. F) Treating RN: Cornell Barman Primary Care Sina Lucchesi: Claris Gower Other Clinician: Massie Kluver Referring Amari Zagal: Claris Gower Treating Odus Clasby/Extender: Skipper Cliche in Treatment: 28 Vital Signs Time Taken: 08:56 Temperature (F): 98.1 Height (in): 61 Pulse (bpm): 81 Weight (lbs): 250 Respiratory Rate (breaths/min): 18 Body Mass Index (BMI): 47.2 Blood Pressure (mmHg): 157/92 Reference  Range: 80 - 120 mg / dl Electronic Signature(s) Signed: 10/30/2021 12:49:02 PM By: Massie Kluver Entered By: Massie Kluver on 10/30/2021 08:59:06

## 2021-11-06 ENCOUNTER — Encounter: Payer: Medicare Other | Admitting: Physician Assistant

## 2021-11-06 DIAGNOSIS — E11621 Type 2 diabetes mellitus with foot ulcer: Secondary | ICD-10-CM | POA: Diagnosis not present

## 2021-11-06 NOTE — Progress Notes (Signed)
[name], [name] ([id number]) Visit Report for 11/06/2021 Arrival Information Details Patient Name: [name], [name] Date of Service: 11/06/2021 8:30 AM Medical Record Number: [id number] Patient Account Number: [id number] Date of Birth/Sex: 11/23/1940 (81 y.o. F) Treating RN: Cornell Barman Primary Care Icarus Partch: Claris Gower Other Clinician: Referring Martavius Lusty: Claris Gower Treating Gale Klar/Extender: Skipper Cliche in Treatment: 29 Visit Information History Since Last Visit Added or deleted any medications: No Patient Arrived: Cane Has Dressing in Place as Prescribed: Yes Arrival Time: 08:36 Pain Present Now: No Accompanied By: self Transfer Assistance: None Patient Identification Verified: Yes Secondary Verification Process Completed: Yes Patient Requires Transmission-Based Precautions: No Patient Has Alerts: No Electronic Signature(s) Signed: 11/06/2021 11:29:22 AM By: Gretta Cool, BSN, RN, CWS, [name] RN, BSN Entered By: Gretta Cool, BSN, RN, CWS, [name] on 11/06/2021 08:37:00 Heavner, [name] ([id number]) -------------------------------------------------------------------------------- Encounter Discharge Information Details Patient Name: [name] Date of Service: 11/06/2021 8:30 AM Medical Record Number: [id number] Patient Account Number: [id number] Date of Birth/Sex: 01-Jun-1940 (81 y.o. F) Treating RN: Cornell Barman Primary Care Devean Skoczylas: Claris Gower Other Clinician: Referring Alvan Culpepper: Claris Gower Treating Leea Rambeau/Extender: Skipper Cliche in Treatment: 29 Encounter Discharge Information Items Post Procedure Vitals Discharge Condition: Stable Temperature (F): 98.1 Ambulatory Status: Cane Pulse (bpm): 88 Discharge Destination: Home Respiratory Rate (breaths/min): 16 Transportation: Private Auto Blood Pressure (mmHg): 148/55 Accompanied By: self Schedule Follow-up Appointment: Yes Clinical Summary of Care: Electronic Signature(s) Signed: 11/06/2021 11:29:22 AM By:  Gretta Cool, BSN, RN, CWS, [name] RN, BSN Entered By: Gretta Cool, BSN, RN, CWS, [name] on 11/06/2021 09:12:15 Buford, [name] ([id number]) -------------------------------------------------------------------------------- Lower Extremity Assessment Details Patient Name: [name] Date of Service: 11/06/2021 8:30 AM Medical Record Number: [id number] Patient Account Number: [id number] Date of Birth/Sex: Apr 19, 1940 (81 y.o. F) Treating RN: Cornell Barman Primary Care Brockton Mckesson: Claris Gower Other Clinician: Referring Meir Elwood: Claris Gower Treating Nikolaos Maddocks/Extender: Skipper Cliche in Treatment: 29 Edema Assessment Assessed: [Left: No] Patrice Paradise: No] [Left: Edema] [Right: :] Calf Left: Right: Point of Measurement: 39 cm From Medial Instep 42.3 cm Ankle Left: Right: Point of Measurement: 10 cm From Medial Instep 20.7 cm Vascular Assessment Pulses: Dorsalis Pedis Palpable: [Left:Yes] Electronic Signature(s) Signed: 11/06/2021 11:29:22 AM By: Gretta Cool, BSN, RN, CWS, [name] RN, BSN Entered By: Gretta Cool, BSN, RN, CWS, [name] on 11/06/2021 08:47:48 Dizdarevic, [name] ([id number]) -------------------------------------------------------------------------------- Multi Wound Chart Details Patient Name: [name] Date of Service: 11/06/2021 8:30 AM Medical Record Number: [id number] Patient Account Number: [id number] Date of Birth/Sex: [birth year] (81 y.o. 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 Extremity Comorbid History: Chronic sinus problems/congestion, Chronic sinus problems/congestion, N/A Anemia, Sleep Apnea, Congestive Anemia, Sleep Apnea, Congestive Heart Failure, Hypertension, Type II Heart Failure, Hypertension, Type II Diabetes, Neuropathy Diabetes, Neuropathy Date Acquired: 02/23/2020 10/15/2021 N/A Weeks of Treatment: 29 3 N/A Wound Status: Open Healed - Epithelialized N/A Wound Recurrence: No No N/A Measurements L x W x D (cm) 0.2x0.3x0.1 0x0x0 N/A Area (cm) : 0.047 0 N/A Volume (cm) : 0.005 0 N/A % Reduction in Area: 99.00% 100.00% N/A % Reduction in Volume: 98.90% 100.00% N/A Starting Position 1 (o'clock): 10 Ending Position 1 (o'clock): 2 Maximum Distance  1 (cm): 0.2 Undermining: Yes N/A N/A Classification: Grade 1 Full Thickness Without Exposed N/A Support Structures Exudate Amount: Medium Small N/A Exudate Type: Serosanguineous Serosanguineous N/A Exudate Color: red, brown red, brown N/A Wound Margin: Thickened N/A N/A Granulation Amount: Large (67-100%) None Present (0%) N/A Granulation Quality: Pink N/A N/A Necrotic Amount: Small (1-33%) None Present (0%) N/A Exposed Structures: Fat Layer (Subcutaneous Tissue): Fat Layer (Subcutaneous Tissue): N/A Yes Yes Fascia: No Tendon: No Muscle: No Joint: No Bone: No Epithelialization: Small (1-33%) Large (67-100%) N/A Treatment Notes [name], [name] ([id number]) Electronic Signature(s) Signed: 11/06/2021 11:29:22 AM By: Gretta Cool, BSN, RN, CWS, [name] RN, BSN Entered By: Gretta Cool, BSN, RN, CWS, [name] on 11/06/2021 08:51:23 [name], [name] ([id number]) -------------------------------------------------------------------------------- Multi-Disciplinary Care Plan Details Patient Name: [name] Date of Service: 11/06/2021 8:30 AM Medical Record Number: [id number] Patient Account Number: [id number] Date of Birth/Sex: 1940-08-25 (81 y.o. F) Treating RN: Cornell Barman Primary Care Mechell Girgis: Claris Gower Other  Clinician: Referring Shadai Mcclane: Claris Gower Treating Kajal Scalici/Extender: Skipper Cliche in Treatment: 29 Active Inactive Necrotic Tissue Nursing Diagnoses: Impaired tissue integrity related to necrotic/devitalized tissue Knowledge deficit related to management of necrotic/devitalized tissue Goals: Necrotic/devitalized tissue will be minimized in the wound bed Date Initiated: 11/06/2021 Target Resolution Date: 11/06/2021 Goal Status: Active Patient/caregiver will verbalize understanding of reason and process for debridement of necrotic tissue Date Initiated: 11/06/2021 Target Resolution Date: 11/06/2021 Goal Status: Active Interventions: Assess patient pain level pre-, during and post procedure and prior to discharge Provide education on necrotic tissue and debridement process Treatment Activities: Apply topical anesthetic as ordered : 11/06/2021 Excisional debridement : 11/06/2021 Notes: Wound/Skin Impairment Nursing Diagnoses: Impaired tissue integrity Knowledge deficit related to ulceration/compromised skin integrity Goals: Ulcer/skin breakdown will have a volume reduction of 30% by week 4 Date Initiated: 04/17/2021 Date Inactivated: 07/03/2021 Target Resolution Date: 05/15/2021 Goal Status: Unmet Unmet Reason: comorbities Ulcer/skin breakdown will have a volume reduction of 50% by week 8 Date Initiated: 04/17/2021 Date Inactivated: 07/03/2021 Target Resolution Date: 06/12/2021 Goal Status: Met Ulcer/skin breakdown will have a volume reduction of 80% by week 12 Date Initiated: 04/17/2021 Target Resolution Date: 07/10/2021 Goal Status: Active Ulcer/skin breakdown will heal within 14 weeks Date Initiated: 04/17/2021 Target Resolution Date: 07/24/2021 Goal Status: Active Interventions: Assess patient/caregiver ability to obtain necessary supplies Assess patient/caregiver ability to perform ulcer/skin care regimen upon admission and as needed Assess ulceration(s) every  visit Provide education on ulcer and skin care Notes: ALEXXUS, [facility] ([id number]) Electronic Signature(s) Signed: 11/06/2021 11:29:22 AM By: Gretta Cool, BSN, RN, CWS, [name] RN, BSN Entered By: Gretta Cool, BSN, RN, CWS, [name] on 11/06/2021 08:51:10 [name], [name] ([id number]) -------------------------------------------------------------------------------- Pain Assessment Details Patient Name: [name] Date of Service: 11/06/2021 8:30 AM Medical Record Number: [id number] Patient Account Number: [id number] Date of Birth/Sex: 07-12-1940 (81 y.o. F) Treating RN: Cornell Barman Primary Care Saumya Hukill: Claris Gower Other Clinician: Referring Namira Rosekrans: Claris Gower Treating Shuna Tabor/Extender: Skipper Cliche in Treatment: 29 Active Problems Location of Pain Severity and Description of Pain Patient Has Paino No Site Locations Pain Management and Medication Current Pain Management: Notes PAtient denies pain at this time. Electronic Signature(s) Signed: 11/06/2021 11:29:22 AM By: Gretta Cool, BSN, RN, CWS, [name] RN, BSN Entered By: Gretta Cool, BSN, RN, CWS, [name] on 11/06/2021 08:46:50 [name], [name] ([id number]) -------------------------------------------------------------------------------- Patient/Caregiver Education Details Patient Name: [name] Date of Service: 11/06/2021 8:30 AM Medical Record Number: [id number] Patient Account Number: [id number] Date of Birth/Gender: Oct 28, 1940 (81 y.o. F) Treating RN: Cornell Barman Primary Care Physician: Arelia Sneddon,  Redmond Pulling Other Clinician: Referring Physician: Claris Gower Treating Physician/Extender: Skipper Cliche in Treatment: 29 Education Assessment Education Provided To: Patient Education Topics Provided Wound Debridement: Handouts: Wound Debridement Methods: Demonstration, Explain/Verbal Responses: State content correctly Wound/Skin Impairment: Handouts: Caring for Your Ulcer, Other: Continue wound care as prescribed Methods:  Demonstration Responses: State content correctly Electronic Signature(s) Signed: 11/06/2021 11:29:22 AM By: Gretta Cool, BSN, RN, CWS, [name] RN, BSN Entered By: Gretta Cool, BSN, RN, CWS, [name] on 11/06/2021 08:54:11 [name], [name] ([id number]) -------------------------------------------------------------------------------- Wound Assessment Details Patient Name: [name] Date of Service: 11/06/2021 8:30 AM Medical Record Number: [id number] Patient Account Number: [id number] Date of Birth/Sex: 01/07/1941 (81 y.o. F) Treating RN: Cornell Barman Primary Care Makaela Cando: Claris Gower Other Clinician: Referring Amberly Livas: Claris Gower Treating Mehar Sagen/Extender: Skipper Cliche in Treatment: 29 Wound Status Wound Number: 1 Primary Diabetic Wound/Ulcer of the Lower Extremity Etiology: Wound Location: Left Toe Great Wound Open Wounding Event: Gradually Appeared Status: Date Acquired: 02/23/2020 Comorbid Chronic sinus problems/congestion, Anemia, Sleep Apnea, Weeks Of Treatment: 29 History: Congestive Heart Failure, Hypertension, Type II Diabetes, Clustered Wound: No Neuropathy Photos Wound Measurements Length: (cm) 0.2 Width: (cm) 0.3 Depth: (cm) 0.1 Area: (cm) 0.047 Volume: (cm) 0.005 % Reduction in Area: 99% % Reduction in Volume: 98.9% Epithelialization: Small (1-33%) Tunneling: No Undermining: Yes Starting Position (o'clock): 10 Ending Position (o'clock): 2 Maximum Distance: (cm) 0.2 Wound Description Classification: Grade 1 Wound Margin: Thickened Exudate Amount: Medium Exudate Type: Serosanguineous Exudate Color: red, brown Foul Odor After Cleansing: No Slough/Fibrino Yes Wound Bed Granulation Amount: Large (67-100%) Exposed Structure Granulation Quality: Pink Fat Layer (Subcutaneous Tissue) Exposed: Yes Necrotic Amount: Small (1-33%) Necrotic Quality: Adherent Slough Treatment Notes Wound #1 (Toe Great) Wound Laterality: Left Cleanser [place] Ancillary Kit - 15 Day  Supply Discharge Instruction: Use supplies as instructed; Kit contains: (15) Saline Bullets; (15) 3x3 Gauze; 15 pr Gloves [name], Cheryl A. ([id number]) Normal Saline Discharge Instruction: Wash your hands with soap and water. Remove old dressing, discard into plastic bag and place into trash. Cleanse the wound with Normal Saline prior to applying a clean dressing using gauze sponges, not tissues or cotton balls. Do not scrub or use excessive force. Pat dry using gauze sponges, not tissue or cotton balls. Peri-Wound Care Topical Primary Dressing Hydrofera Blue Ready Transfer Foam, 2.5x2.5 (in/in) Discharge Instruction: Apply Hydrofera Blue Ready to wound bed as directed Secondary Dressing Coverlet Latex-Free Fabric Adhesive Dressings Discharge Instruction: 1.5 x 2 Secured With Compression Wrap Compression Stockings Add-Ons Gauze Non-Bordered 4x4 (in/in) Discharge Instruction: Cover with dry gauze, rolled up and placed under left great toe Electronic Signature(s) Signed: 11/06/2021 11:29:22 AM By: Gretta Cool, BSN, RN, CWS, [name] RN, BSN Entered By: Gretta Cool, BSN, RN, CWS, [name] on 11/06/2021 08:44:36 [name], [name] ([id number]) -------------------------------------------------------------------------------- Wound Assessment Details Patient Name: [name] Date of Service: 11/06/2021 8:30 AM Medical Record Number: [id number] Patient Account Number: [id number] Date of Birth/Sex: 05/13/1940 (81 y.o. F) Treating RN: Cornell Barman Primary Care Maxon Kresse: Claris Gower Other Clinician: Referring Zalma Channing: Claris Gower Treating [name]/Extender: Skipper Cliche in Treatment: 29 Wound Status Wound Number: 3 Primary Skin Tear Etiology: Wound Location: Left, Anterior Lower Leg Wound Healed - Epithelialized Wounding Event: Blister Status: Date Acquired: 10/15/2021 Comorbid Chronic sinus problems/congestion, Anemia, Sleep Apnea, Weeks Of Treatment: 3 History: Congestive Heart Failure,  Hypertension, Type II Diabetes, Clustered Wound: No Neuropathy Photos Wound Measurements Length: (cm) 0 Width: (cm) 0 Depth: (cm) 0 Area: (cm) Volume: (cm) % Reduction in Area: 100% % Reduction in Volume: 100%  Epithelialization: Large (67-100%) 0 0 Wound Description Classification: Full Thickness Without Exposed Support Structu Exudate Amount: Small Exudate Type: Serosanguineous Exudate Color: red, brown res Foul Odor After Cleansing: No Slough/Fibrino No Wound Bed Granulation Amount: None Present (0%) Exposed Structure Necrotic Amount: None Present (0%) Fascia Exposed: No Fat Layer (Subcutaneous Tissue) Exposed: Yes Tendon Exposed: No Muscle Exposed: No Joint Exposed: No Bone Exposed: No Electronic Signature(s) Signed: 11/06/2021 11:29:22 AM By: Gretta Cool, BSN, RN, CWS, [name] RN, BSN Entered By: Gretta Cool, BSN, RN, CWS, [name] on 11/06/2021 08:45:09 Raybon, [name] ([id number]) -------------------------------------------------------------------------------- Vitals Details Patient Name: [name] Date of Service: 11/06/2021 8:30 AM Medical Record Number: [id number] Patient Account Number: [id number] Date of Birth/Sex: 1940/12/06 (81 y.o. F) Treating RN: Cornell Barman Primary Care Vadis Slabach: Claris Gower Other Clinician: Referring Jenissa Tyrell: Claris Gower Treating Creola Krotz/Extender: Skipper Cliche in Treatment: 29 Vital Signs Time Taken: 08:35 Temperature (F): 98.1 Height (in): 61 Pulse (bpm): 88 Weight (lbs): 250 Respiratory Rate (breaths/min): 16 Body Mass Index (BMI): 47.2 Blood Pressure (mmHg): 148/55 Reference Range: 80 - 120 mg / dl Electronic Signature(s) Signed: 11/06/2021 11:29:22 AM By: Gretta Cool, BSN, RN, CWS, [name] RN, BSN Entered By: Gretta Cool, BSN, RN, CWS, [name] on 11/06/2021 08:46:33

## 2021-11-06 NOTE — Progress Notes (Addendum)
[name] ([id number]) Visit Report for 11/06/2021 Chief Complaint Document Details Patient Name: [name], [name] Date of Service: 11/06/2021 8:30 AM Medical Record Number: [id number] Patient Account Number: [id number] Date of Birth/Sex: September 04, 1940 (81 y.o. F) Treating RN: Cornell Barman Primary Care Provider: Claris Gower Other Clinician: Referring Provider: Claris Gower Treating Provider/Extender: Skipper Cliche in Treatment: 29 Information Obtained from: Patient Chief Complaint Multiple toe ulcersations Electronic Signature(s) Signed: 11/06/2021 8:38:51 AM By: Worthy Keeler PA-C Entered By: Worthy Keeler on 11/06/2021 08:38:51 [name] ([id number]) -------------------------------------------------------------------------------- Debridement Details Patient Name: [name] Date of Service: 11/06/2021 8:30 AM Medical Record Number: [id number] Patient Account Number: [id number] Date of Birth/Sex: Jan 11, 1941 (81 y.o. F) Treating RN: Cornell Barman Primary Care Provider: Claris Gower Other Clinician: Referring Provider: Claris Gower Treating Provider/Extender: Skipper Cliche in Treatment: 29 Debridement Performed for Wound #1 Left Toe Great Assessment: Performed By: Physician Tommie Sams., PA-C Debridement Type: Debridement Severity of Tissue Pre Debridement: Fat layer exposed Level of Consciousness (Pre- Awake and Alert procedure): Pre-procedure Verification/Time Out Yes - 08:51 Taken: Total Area Debrided (L x W): 0.2 (cm) x 0.3 (cm) = 0.06 (cm) Tissue and other material Viable, Non-Viable, Callus, Slough, Subcutaneous, Slough debrided: Level: Skin/Subcutaneous Tissue Debridement Description: Excisional Instrument: Curette Bleeding: Minimum Hemostasis Achieved: Pressure Response to Treatment: Procedure was tolerated well Level of Consciousness (Post- Awake and Alert procedure): Post Debridement Measurements of Total Wound Length: (cm)  0.2 Width: (cm) 0.3 Depth: (cm) 0.2 Volume: (cm) 0.009 Character of Wound/Ulcer Post Debridement: Stable Severity of Tissue Post Debridement: Fat layer exposed Post Procedure Diagnosis Same as Pre-procedure Electronic Signature(s) Signed: 11/06/2021 11:29:22 AM By: Gretta Cool, BSN, RN, CWS, [name] RN, BSN Signed: 11/06/2021 1:48:50 PM By: Worthy Keeler PA-C Entered By: Gretta Cool, BSN, RN, CWS, [name] on 11/06/2021 08:52:19 [name] ([id number]) -------------------------------------------------------------------------------- HPI Details Patient Name: [name] Date of Service: 11/06/2021 8:30 AM Medical Record Number: [id number] Patient Account Number: [id number] Date of Birth/Sex: 1940-04-19 (81 y.o. F) Treating RN: Cornell Barman Primary Care Provider: Claris Gower Other Clinician: Referring Provider: Claris Gower Treating Provider/Extender: Skipper Cliche in Treatment: 29 History of Present Illness HPI Description: 04/17/2021 upon evaluation patient appears to be doing somewhat poorly currently in regard to her first and second toe of the left foot. She previously has been seen by Dr. Doran Durand she saw him on 31 January he did not feel there was any evidence of osteomyelitis. He did give her a thorough evaluation including x-rays and showed no abnormal findings according to notes. With that being said he felt like that wound care would be beneficial therefore she contacted [place]. She has currently been using antibiotic ointment and has noted that this wound on the great toe has been there for about a year. She is not certain whether there is anything on the second toe or not she has not really noted any drainage but at the same time she cannot be sure there is nothing hiding up underneath a significant callus here. Her feet really do not have any major complications with regard to pain and that is good news. She does have neuropathy. Patient does have a history of diabetes mellitus type 2,  hypertension, and her most recent hemoglobin A1c was 7.6 on June 2022. 05/08/2021 upon evaluation today patient appears to be doing poorly in regard to her toes unfortunately she has a lot of callus buildup. Its been almost a month since I last saw her. Obviously in this  amount of time she has built up quite a bit of callus she was sick that is the reason she did not make it in. Nonetheless I do think that weekly visits is probably can be the best thing is asking to help to allow these areas to heal much more effectively and quickly. Fortunately I do not see any evidence of active infection locally or systemically which is great news. 05/15/2021 upon evaluation today patient appears to be doing well currently in regard to her wound. Overall I think that we are definitely headed in the right direction with regard to the toes. Fortunately I do not see any signs of active infection locally or systemically at this time which is great news. No fevers, chills, nausea, vomiting, or diarrhea. 05/22/2021 upon evaluation today patient appears to be doing well with regard to her wound. She has been tolerating the dressing changes without complication. Fortunately the second toe is healed the first toe on the left foot is still open but does not appear to be doing nearly as poorly at this time. I do think we can perform a little bit of debridement in order to clear away some of the necrotic debris patient is in agreement with that plan. 05-29-2021 upon evaluation today patient appears to be doing worse currently in regard to the pain experience. Her leg is still very red in fact I think is a little bit worse than last week unfortunately. Last week I was even concerned about a little bit of cellulitis she really felt like it was more related to her swelling but again I am not certain that that is necessarily the case. Fortunately I do not see any signs of active infection at this time systemically though locally I am  definitely concerned in this regard. 06-05-2021 upon evaluation today patient appears to be doing well with regard to her toe ulcer this is worse is also not better though. Unfortunately it is hurting more in her leg in general is also bothering her. This is despite being. She is also tolerating Levaquin with making her dizzy. Nonetheless I am not certain its helping after she has been on this week I am not seeing much of the improvement with regard to the cellulitis in her leg. She is also previously been on doxycycline although that was towards the end of February. 06-12-2021 upon evaluation today patient appears to be doing well with regard to her wound. We are definitely seeing signs of improvement with regard to size. With that being said I do not see any evidence of active infection at this time which is great news and very pleased in that regard. She is going require some debridement both for callus as well as clearing away some of the slough and biofilm. 07-03-2021 upon evaluation today patient appears to be doing well with regard to his wound on the foot. With that being said he unfortunately did have some of the padding along the heel that got pushed down and subsequently he tells me that it was feeling like it was bunched up under his foot and the heel location and bothering him over the past couple of days. With that being said that when we remove the cast today unbeknownst to [facility] the padding had been pushed down and the cast saw actually nicked his heel this looks like a very light abrasion. Nonetheless he does not have any pain and there is no active bleeding at this time. I actually did take a picture to  show him as well and we documented it we will see how this looks obviously next week but I think this will be healed by Wednesday. The wound itself looks to be doing much better there is some callus to remove some slough and biofilm as well. 07-10-2021 upon evaluation today patient  appears to be doing well with regard to her wounds. She has been tolerating the dressing changes without complication. Actually I feel like the foot is looking a little better in regard to the great toe. Everything else is doing better as well. I feel like she has been much more careful with that and from the callus standpoint things are greatly improved it also helps that she shows up on a regular basis. With that being said we are going to be seeing her following her MRI that scheduled for next week. 07-24-2021 upon evaluation today patient appears to be doing well with regard to her wound. She actually is shown signs of improvement there are still some callus not nearly as bad as what it is then I think we need to try to see what we can do to get some of this off today but hopefully will continue to show signs of improvement with the current regimen. She has thought about the hyperbarics she tells me there is just really not can be a way for her to get here regularly for that. 07-31-2021 upon evaluation today patient appears to be doing well with regard to her toe ulcer although its not significantly smaller its not worse either. She still has a lot of callus buildup. With that being said I think we need to do something to try to keep pressure off of the toe. I think a bolster underneath the big toe could be beneficial in this regard. 08-14-2021 upon evaluation today patient's wound actually showed signs of some improvement she still had callus but for 2 weeks this was not bad at all compared to what we normally find. I do think we are on the right track here and the good news is she seems to be making some excellent progress in my opinion. 08-21-2021 upon evaluation today patient appears to be doing well with regard to her toe ulcer. Unfortunately her silicone toe spacer was too tight Lahti, Dustyn A. ([id number]) for her toe by the time she got home and was already causing her pain. For that reason she  has not been wearing that she is putting the rolled up gauze up underneath. Obviously I think this is still a good way to go and better than nothing the biggest thing is that she still is walking without shoes at home I discussed this with her previously but again I have that discussion again today I want her to have shoes on at all times she has been going around just in her "sock feet". 10-02-2021 upon evaluation today patient appears to be doing poorly in regard to her toe. She is actually been in sinus June 30 when I last saw her. She has had a lot of issues with her kidneys and then she also had her little puppy passed away she tells me. Unfortunately I think this has weighed heavily on her. Fortunately there does not appear to be any evidence of active infection at this time which is great news. No fevers, chills, nausea, vomiting, or diarrhea. 10-09-2021 upon evaluation today patient appears to be doing well currently in regard to her wound. She has been tolerating the dressing changes without complication.  Fortunately there does not appear to be any signs of active infection locally or systemically at this time which is great news. No fevers, chills, nausea, vomiting, or diarrhea. 10-16-2021 upon evaluation today patient appears to be doing well with regard to her toe ulcer I really feel like this is showing signs of improvement which is great news. Fortunately there does not appear to be any evidence of infection locally or systemically which is great news. No fevers, chills, nausea, vomiting, or diarrhea. 10-23-2021 upon evaluation today patient's wound is showing signs of still having quite a bit of callus around the edge we are making a bolster to go underneath the toe but she still has quite a bit of drainage noted at this point. Fortunately there does not appear to be any signs of infection. 10-30-2021 upon evaluation today patient appears to be doing well currently in regard to her wound. She  has been tolerating the dressing changes without complication. Fortunately there does not appear to be any evidence of active infection locally or systemically at this time. No fevers, chills, nausea, vomiting, or diarrhea. 11-06-2021 upon evaluation today patient appears to be doing well currently in regard to her toe although it is about the same as may be slightly smaller. Fortunately I do not see any evidence of infection currently which is good news. Electronic Signature(s) Signed: 11/06/2021 9:07:33 AM By: Worthy Keeler PA-C Entered By: Worthy Keeler on 11/06/2021 09:07:33 Dunker, Kayren [name] ([id number]) -------------------------------------------------------------------------------- Physical Exam Details Patient Name: [name] Date of Service: 11/06/2021 8:30 AM Medical Record Number: [id number] Patient Account Number: [id number] Date of Birth/Sex: August 22, 1940 (81 y.o. F) Treating RN: Cornell Barman Primary Care Provider: Claris Gower Other Clinician: Referring Provider: Claris Gower Treating Provider/Extender: Skipper Cliche in Treatment: 74 Constitutional Well-nourished and well-hydrated in no acute distress. Respiratory normal breathing without difficulty. Psychiatric this patient is able to make decisions and demonstrates good insight into disease process. Alert and Oriented x 3. pleasant and cooperative. Notes Upon inspection patient's wound bed actually showed signs of good granulation and epithelization at this point. Fortunately I see no evidence of infection which is great news and overall I am extremely pleased with where things stand currently. Electronic Signature(s) Signed: 11/06/2021 9:07:46 AM By: Worthy Keeler PA-C Entered By: Worthy Keeler on 11/06/2021 09:07:46 Vandenheuvel, Kayren [name] ([id number]) -------------------------------------------------------------------------------- Physician Orders Details Patient Name: [name] Date of Service:  11/06/2021 8:30 AM Medical Record Number: [id number] Patient Account Number: [id number] Date of Birth/Sex: [birth year] (81 y.o. 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 o Nurse Visit as needed [employer] o Wash wounds with antibacterial soap and water. o May shower; gently cleanse wound with antibacterial soap, rinse and pat dry prior to dressing wounds o No tub bath. Anesthetic (Use 'Patient Medications' Section for Anesthetic Order Entry) o Lidocaine applied to wound bed Edema Control - Lymphedema / Segmental Compressive Device / Other o Elevate, Exercise Daily and Avoid  Standing for Long Periods of Time. o Elevate legs to the level of the heart and pump ankles as often as possible o Elevate leg(s) parallel to the floor when sitting. o DO YOUR BEST to sleep in the bed at night. DO NOT sleep in your recliner. Long hours of sitting in a recliner leads to swelling of the legs and/or potential wounds on your backside. Wound Treatment Wound #1 - Toe Great Wound Laterality: Left Cleanser: [place] Ancillary Kit - 15 Day Supply (Generic) Every Other Day/30 Days Discharge Instructions: Use supplies as instructed; Kit contains: (15) Saline Bullets; (15) 3x3 Gauze; 15 pr Gloves Cleanser: Normal Saline Every Other Day/30 Days Discharge Instructions:  Wash your hands with soap and water. Remove old dressing, discard into plastic bag and place into trash. Cleanse the wound with Normal Saline prior to applying a clean dressing using gauze sponges, not tissues or cotton balls. Do not scrub or use excessive force. Pat dry using gauze sponges, not tissue or cotton balls. Primary Dressing: Hydrofera Blue Ready Transfer Foam, 2.5x2.5 (in/in) (Generic) Every Other Day/30 Days Discharge Instructions: Apply Hydrofera Blue Ready to wound bed as directed Secondary Dressing: Coverlet Latex-Free Fabric Adhesive Dressings Every Other Day/30 Days Discharge Instructions: 1.5 x 2 Add-Ons: Gauze Non-Bordered 4x4 (in/in) Every Other Day/30 Days Discharge Instructions: Cover with dry gauze, rolled up and placed under left great toe Services and Therapies o Ankle Brachial Index (ABI)-Bilateral - ABI and TBI (left non-healing wound of great toe) [place], Kayren [name] ([id number]) Electronic Signature(s) Signed: 11/06/2021 11:29:22 AM By: Gretta Cool, BSN, RN, CWS, [name] RN, BSN Signed: 11/06/2021 1:48:50 PM By: Worthy Keeler PA-C Entered By: Gretta Cool, BSN, RN, CWS, [name] on 11/06/2021 09:03:18 Luca, Kayren [name] ([id number]) -------------------------------------------------------------------------------- Prescription 11/06/2021 Patient Name: [name] Provider: Jeri Cos PA-C Date of Birth: [birth year] NPI#: [id number] Sex: F DEA#: [id number] Phone #: [phone] License #: Patient Address: [place], [place] [address] [address], [place], [place] [address] [phone] Allergies Sulfa (Sulfonamide Antibiotics); castor oil; amoxicillin; Demerol; hydrocodone Provider's Orders o Ankle Brachial Index (ABI)-Bilateral - ABI and TBI (left non-healing wound of great toe) Hand Signature: Date(s): Electronic Signature(s) Signed: 11/06/2021 11:29:22 AM By: Gretta Cool, BSN, RN,  CWS, [name] RN, BSN Signed: 11/06/2021 1:48:50 PM By: Worthy Keeler PA-C Entered By: Gretta Cool, BSN, RN, CWS, [name] on 11/06/2021 09:03:19 Stephens, Kayren [name] ([id number]) --------------------------------------------------------------------------------  Problem List Details Patient Name: [name] Date of Service: 11/06/2021 8:30 AM Medical Record Number: [id number] Patient Account Number: [id number] Date of Birth/Sex: 1940-09-18 (81 y.o. 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 04/17/2021 No Yes Z89.421 Acquired absence of other right toe(s) 04/17/2021 No Yes I10 Essential (primary) hypertension 04/17/2021 No Yes Inactive Problems Resolved Problems Electronic Signature(s) Signed: 11/06/2021 8:38:47 AM By: Worthy Keeler PA-C Entered By: Worthy Keeler on 11/06/2021 08:38:47 Deboard, Kayren [name] ([id number]) -------------------------------------------------------------------------------- Progress Note Details Patient Name: [name] Date of Service: 11/06/2021 8:30 AM Medical Record Number:  [id number] Patient Account Number: [id number] Date of Birth/Sex: 10-25-1940 (81 y.o. F) Treating RN: Cornell Barman Primary Care Provider: Claris Gower Other Clinician: Referring Provider: Claris Gower Treating Provider/Extender: Skipper Cliche in Treatment: 29 Subjective Chief Complaint Information obtained from Patient Multiple toe ulcersations History of Present  Illness (HPI) 04/17/2021 upon evaluation patient appears to be doing somewhat poorly currently in regard to her first and second toe of the left foot. She previously has been seen by Dr. Doran Durand she saw him on 31 January he did not feel there was any evidence of osteomyelitis. He did give her a thorough evaluation including x-rays and showed no abnormal findings according to notes. With that being said he felt like that wound care would be beneficial therefore she contacted [place]. She has currently been using antibiotic ointment and has noted that this wound on the great toe has been there for about a year. She is not certain whether there is anything on the second toe or not she has not really noted any drainage but at the same time she cannot be sure there is nothing hiding up underneath a significant callus here. Her feet really do not have any major complications with regard to pain and that is good news. She does have neuropathy. Patient does have a history of diabetes mellitus type 2, hypertension, and her most recent hemoglobin A1c was 7.6 on June 2022. 05/08/2021 upon evaluation today patient appears to be doing poorly in regard to her toes unfortunately she has a lot of callus buildup. Its been almost a month since I last saw her. Obviously in this amount of time she has built up quite a bit of callus she was sick that is the reason she did not make it in. Nonetheless I do think that weekly visits is probably can be the best thing is asking to help to allow these areas to heal much more effectively and quickly. Fortunately I do not see any evidence of active infection locally or systemically which is great news. 05/15/2021 upon evaluation today patient appears to be doing well currently in regard to her wound. Overall I think that we are definitely headed in the right direction with regard to the toes. Fortunately I do not see any signs of active infection locally or systemically at this time which  is great news. No fevers, chills, nausea, vomiting, or diarrhea. 05/22/2021 upon evaluation today patient appears to be doing well with regard to her wound. She has been tolerating the dressing changes without complication. Fortunately the second toe is healed the first toe on the left foot is still open but does not appear to be doing nearly as poorly at this time. I do think we can perform a little bit of debridement in order to clear away some of the necrotic debris patient is in agreement with that plan. 05-29-2021 upon evaluation today patient appears to be doing worse currently in regard to the pain experience. Her leg is still very red in fact I think is a little bit worse than last week unfortunately. Last week I was even concerned about a little bit of cellulitis she really felt like it was more related to her swelling but again I am not certain that that is necessarily the case. Fortunately I do not see any signs of active infection at this time systemically though locally I am definitely concerned in this regard. 06-05-2021 upon evaluation today patient appears to be doing well with regard to her toe ulcer this  is worse is also not better though. Unfortunately it is hurting more in her leg in general is also bothering her. This is despite being. She is also tolerating Levaquin with making her dizzy. Nonetheless I am not certain its helping after she has been on this week I am not seeing much of the improvement with regard to the cellulitis in her leg. She is also previously been on doxycycline although that was towards the end of February. 06-12-2021 upon evaluation today patient appears to be doing well with regard to her wound. We are definitely seeing signs of improvement with regard to size. With that being said I do not see any evidence of active infection at this time which is great news and very pleased in that regard. She is going require some debridement both for callus as well as  clearing away some of the slough and biofilm. 07-03-2021 upon evaluation today patient appears to be doing well with regard to his wound on the foot. With that being said he unfortunately did have some of the padding along the heel that got pushed down and subsequently he tells me that it was feeling like it was bunched up under his foot and the heel location and bothering him over the past couple of days. With that being said that when we remove the cast today unbeknownst to [facility] the padding had been pushed down and the cast saw actually nicked his heel this looks like a very light abrasion. Nonetheless he does not have any pain and there is no active bleeding at this time. I actually did take a picture to show him as well and we documented it we will see how this looks obviously next week but I think this will be healed by Wednesday. The wound itself looks to be doing much better there is some callus to remove some slough and biofilm as well. 07-10-2021 upon evaluation today patient appears to be doing well with regard to her wounds. She has been tolerating the dressing changes without complication. Actually I feel like the foot is looking a little better in regard to the great toe. Everything else is doing better as well. I feel like she has been much more careful with that and from the callus standpoint things are greatly improved it also helps that she shows up on a regular basis. With that being said we are going to be seeing her following her MRI that scheduled for next week. 07-24-2021 upon evaluation today patient appears to be doing well with regard to her wound. She actually is shown signs of improvement there are still some callus not nearly as bad as what it is then I think we need to try to see what we can do to get some of this off today but hopefully will continue to show signs of improvement with the current regimen. She has thought about the hyperbarics she tells me there is just  really not can be a way for her to get here regularly for that. 07-31-2021 upon evaluation today patient appears to be doing well with regard to her toe ulcer although its not significantly smaller its not worse either. She still has a lot of callus buildup. With that being said I think we need to do something to try to keep pressure off of the toe. I think a bolster underneath the big toe could be beneficial in this regard. Ferrone, Kayren [name] ([id number]) 08-14-2021 upon evaluation today patient's wound actually showed signs of some improvement  she still had callus but for 2 weeks this was not bad at all compared to what we normally find. I do think we are on the right track here and the good news is she seems to be making some excellent progress in my opinion. 08-21-2021 upon evaluation today patient appears to be doing well with regard to her toe ulcer. Unfortunately her silicone toe spacer was too tight for her toe by the time she got home and was already causing her pain. For that reason she has not been wearing that she is putting the rolled up gauze up underneath. Obviously I think this is still a good way to go and better than nothing the biggest thing is that she still is walking without shoes at home I discussed this with her previously but again I have that discussion again today I want her to have shoes on at all times she has been going around just in her "sock feet". 10-02-2021 upon evaluation today patient appears to be doing poorly in regard to her toe. She is actually been in sinus June 30 when I last saw her. She has had a lot of issues with her kidneys and then she also had her little puppy passed away she tells me. Unfortunately I think this has weighed heavily on her. Fortunately there does not appear to be any evidence of active infection at this time which is great news. No fevers, chills, nausea, vomiting, or diarrhea. 10-09-2021 upon evaluation today patient appears to be doing well  currently in regard to her wound. She has been tolerating the dressing changes without complication. Fortunately there does not appear to be any signs of active infection locally or systemically at this time which is great news. No fevers, chills, nausea, vomiting, or diarrhea. 10-16-2021 upon evaluation today patient appears to be doing well with regard to her toe ulcer I really feel like this is showing signs of improvement which is great news. Fortunately there does not appear to be any evidence of infection locally or systemically which is great news. No fevers, chills, nausea, vomiting, or diarrhea. 10-23-2021 upon evaluation today patient's wound is showing signs of still having quite a bit of callus around the edge we are making a bolster to go underneath the toe but she still has quite a bit of drainage noted at this point. Fortunately there does not appear to be any signs of infection. 10-30-2021 upon evaluation today patient appears to be doing well currently in regard to her wound. She has been tolerating the dressing changes without complication. Fortunately there does not appear to be any evidence of active infection locally or systemically at this time. No fevers, chills, nausea, vomiting, or diarrhea. 11-06-2021 upon evaluation today patient appears to be doing well currently in regard to her toe although it is about the same as may be slightly smaller. Fortunately I do not see any evidence of infection currently which is good news. Objective Constitutional Well-nourished and well-hydrated in no acute distress. Vitals Time Taken: 8:35 AM, Height: 61 in, Weight: 250 lbs, BMI: 47.2, Temperature: 98.1 F, Pulse: 88 bpm, Respiratory Rate: 16 breaths/min, Blood Pressure: 148/55 mmHg. Respiratory normal breathing without difficulty. Psychiatric this patient is able to make decisions and demonstrates good insight into disease process. Alert and Oriented x 3. pleasant and cooperative. General  Notes: Upon inspection patient's wound bed actually showed signs of good granulation and epithelization at this point. Fortunately I see no evidence of infection which is great news  and overall I am extremely pleased with where things stand currently. Integumentary (Hair, Skin) Wound #1 status is Open. Original cause of wound was Gradually Appeared. The date acquired was: 02/23/2020. The wound has been in treatment 29 weeks. The wound is located on the Left Toe Great. The wound measures 0.2cm length x 0.3cm width x 0.1cm depth; 0.047cm^2 area and 0.005cm^3 volume. There is Fat Layer (Subcutaneous Tissue) exposed. There is no tunneling noted, however, there is undermining starting at 10:00 and ending at 2:00 with a maximum distance of 0.2cm. There is a medium amount of serosanguineous drainage noted. The wound margin is thickened. There is large (67-100%) pink granulation within the wound bed. There is a small (1-33%) amount of necrotic tissue within the wound bed including Adherent Slough. Wound #3 status is Healed - Epithelialized. Original cause of wound was Blister. The date acquired was: 10/15/2021. The wound has been in treatment 3 weeks. The wound is located on the Left,Anterior Lower Leg. The wound measures 0cm length x 0cm width x 0cm depth; 0cm^2 area and 0cm^3 volume. There is Fat Layer (Subcutaneous Tissue) exposed. There is a small amount of serosanguineous drainage noted. There is no granulation within the wound bed. There is no necrotic tissue within the wound bed. 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 There was a Excisional Skin/Subcutaneous Tissue Debridement with a total area of 0.06 sq cm performed by Tommie Sams., PA-C. With the following instrument(s): Curette to remove Viable and Non-Viable tissue/material. Material removed includes Callus, Subcutaneous Tissue, and Slough. No specimens were taken. A time out was conducted at 08:51, prior to the start of the procedure. A Minimum amount of bleeding was controlled with Pressure. The procedure was tolerated well. Post Debridement Measurements: 0.2cm length x 0.3cm width x 0.2cm depth; 0.009cm^3 volume. Character of Wound/Ulcer Post Debridement is stable. Severity of Tissue Post Debridement is: Fat layer exposed. Post procedure Diagnosis Wound #1: Same as Pre-Procedure Plan Follow-up Appointments: Return Appointment in 1 week. Nurse Visit as needed Bathing/ Shower/ Hygiene: Wash wounds with antibacterial soap and water. May shower; gently cleanse wound with antibacterial soap, rinse and pat dry prior to dressing wounds No tub bath. Anesthetic (Use 'Patient Medications' Section for Anesthetic Order Entry): Lidocaine applied to wound bed Edema Control - Lymphedema / Segmental Compressive Device / Other: Elevate, Exercise Daily and Avoid Standing for Long Periods of Time. Elevate legs to the level of the heart and pump ankles as often as possible Elevate leg(s) parallel to the floor when sitting. DO YOUR BEST to sleep in the bed at night. DO NOT sleep in your recliner. Long hours of sitting in a recliner leads to swelling of the legs and/or potential wounds on your backside. Services and Therapies ordered were: Ankle Brachial Index (ABI)-Bilateral - ABI and TBI (left non-healing wound of great toe) WOUND #1: - Toe Great Wound Laterality: Left Cleanser: Byram Ancillary Kit - 15 Day Supply (Generic)  Every Other Day/30 Days Discharge Instructions: Use supplies as instructed; Kit contains: (15) Saline  Bullets; (15) 3x3 Gauze; 15 pr Gloves Cleanser: Normal Saline Every Other Day/30 Days Discharge Instructions: Wash your hands with soap and water. Remove old dressing, discard into plastic bag and place into trash. Cleanse the wound with Normal Saline prior to applying a clean dressing using gauze sponges, not tissues or cotton balls. Do not scrub or use excessive force. Pat dry using gauze sponges, not tissue or cotton balls. Primary Dressing: Hydrofera Blue Ready Transfer Foam, 2.5x2.5 (in/in) (Generic) Every Other Day/30 Days Discharge Instructions: Apply Hydrofera Blue Ready to wound bed as directed Secondary Dressing: Coverlet Latex-Free Fabric Adhesive Dressings Every Other Day/30 Days Discharge Instructions: 1.5 x 2 Add-Ons: Gauze Non-Bordered 4x4 (in/in) Every Other Day/30 Days Discharge Instructions: Cover with dry gauze, rolled up and placed under left great toe 1. I am good recommend that we go ahead and continue with the wound care measures as before and the patient is in agreement with the plan. This includes the use of the [facility]. 2. We will get a continue with the coverlet. 3. I am also can recommend patient should follow-up with vascular as before recommended hopefully they will be able to get in touch with her shortly if she does not hear by Monday we did advise she give them a call. Marando, Kayren [name] ([id number]) We will see patient back for reevaluation in 1 week here in the clinic. If anything worsens or changes patient will contact our office for additional recommendations. Electronic Signature(s) Signed: 11/06/2021 9:08:14 AM By: Worthy Keeler PA-C Entered By: Worthy Keeler on 11/06/2021 09:08:14 Eagleson, Kayren [name] ([id number]) -------------------------------------------------------------------------------- SuperBill Details Patient Name: [name] Date  of Service: 11/06/2021 Medical Record Number: [id number] Patient Account Number: [id number] Date of Birth/Sex: [birth year] (81 y.o. F) Treating RN: Cornell Barman Primary Care Provider: Claris Gower Other Clinician: Referring Provider: Claris Gower Treating Provider/Extender: Skipper Cliche in Treatment: 29 Diagnosis Coding ICD-10 Codes Code Description E11.621 Type 2 diabetes mellitus with foot ulcer L97.522 Non-pressure chronic ulcer of other part of left foot with fat layer exposed L97.822 Non-pressure chronic ulcer of other part of left lower leg with fat layer exposed Z89.411 Acquired absence of right great toe Z89.421 Acquired absence of other right toe(s) I10 Essential (primary) hypertension Facility Procedures CPT4 Code: 16384536 Description: 46803 - DEB SUBQ TISSUE 20 SQ CM/< Modifier: Quantity: 1 CPT4 Code: Description: ICD-10 Diagnosis Description L97.522 Non-pressure chronic ulcer of other part of left foot with fat layer exp Modifier: osed Quantity: Physician Procedures CPT4 Code: 2122482 Description: 11042 - WC PHYS SUBQ TISS 20 SQ CM Modifier: Quantity: 1 CPT4 Code: Description: ICD-10 Diagnosis Description L97.522 Non-pressure chronic ulcer of other part of left foot with fat layer exp Modifier: osed Quantity: Electronic Signature(s) Signed: 11/06/2021 9:08:27 AM By: Worthy Keeler PA-C Entered By: Worthy Keeler on 11/06/2021 09:08:26

## 2021-11-09 ENCOUNTER — Other Ambulatory Visit (INDEPENDENT_AMBULATORY_CARE_PROVIDER_SITE_OTHER): Payer: Self-pay | Admitting: Physician Assistant

## 2021-11-09 DIAGNOSIS — S81802A Unspecified open wound, left lower leg, initial encounter: Secondary | ICD-10-CM

## 2021-11-10 ENCOUNTER — Ambulatory Visit (INDEPENDENT_AMBULATORY_CARE_PROVIDER_SITE_OTHER): Payer: Medicare Other

## 2021-11-10 DIAGNOSIS — S81802A Unspecified open wound, left lower leg, initial encounter: Secondary | ICD-10-CM

## 2021-11-13 ENCOUNTER — Encounter: Payer: Medicare Other | Admitting: Physician Assistant

## 2021-11-13 DIAGNOSIS — E11621 Type 2 diabetes mellitus with foot ulcer: Secondary | ICD-10-CM | POA: Diagnosis not present

## 2021-11-13 NOTE — Progress Notes (Signed)
Zetino, [name] ([id number]) Visit Report for 11/13/2021 Chief Complaint Document Details Patient Name: [name], [name] Date of Service: 11/13/2021 9:00 AM Medical Record Number: [id number] Patient Account Number: [id number] Date of Birth/Sex: Mar 05, 1940 (81 y.o. F) Treating RN: Cornell Barman Primary Care Provider: Claris Gower Other Clinician: Massie Kluver Referring Provider: Claris Gower Treating Provider/Extender: Skipper Cliche in Treatment: 30 Information Obtained from: Patient Chief Complaint Multiple toe ulcersations Electronic Signature(s) Signed: 11/13/2021 9:32:06 AM By: Worthy Keeler PA-C Entered By: Worthy Keeler on 11/13/2021 09:32:06 Szeto, [name] ([id number]) -------------------------------------------------------------------------------- Problem List Details Patient Name: [name] Date of Service: 11/13/2021 9:00 AM Medical Record Number: [id number] Patient Account Number: [id number] Date of Birth/Sex: 01-Mar-1940 (81 y.o. F) Treating RN: Cornell Barman Primary Care Provider: Claris Gower Other Clinician: Massie Kluver Referring Provider: Claris Gower Treating Provider/Extender: Skipper Cliche in Treatment: 30 Active Problems ICD-10 Encounter Code Description Active Date MDM Diagnosis E11.621 Type 2 diabetes mellitus with foot ulcer 04/17/2021 No Yes L97.522 Non-pressure chronic ulcer of other part of left foot with fat layer 04/17/2021 No Yes exposed L97.822 Non-pressure chronic ulcer of other part of left lower leg with fat layer 10/16/2021 No Yes exposed Z89.411 Acquired absence of right great toe 04/17/2021 No Yes Z89.421 Acquired absence of other right toe(s) 04/17/2021 No Yes I10 Essential (primary) hypertension 04/17/2021 No Yes Inactive Problems Resolved Problems Electronic Signature(s) Signed: 11/13/2021 9:32:01 AM By: Worthy Keeler PA-C Entered By: Worthy Keeler on 11/13/2021 09:32:01

## 2021-11-16 NOTE — Progress Notes (Signed)
[name], [name] ([id number]) Visit Report for 11/13/2021 Arrival Information Details Patient Name: [name], [name] Date of Service: 11/13/2021 9:00 AM Medical Record Number: [id number] Patient Account Number: [id number] Date of Birth/Sex: [birth year] (81 y.o. F) Treating RN: Cornell Barman Primary Care Anneliese Leblond: Claris Gower Other Clinician: Massie Kluver Referring Estell Dillinger: Claris Gower Treating Evva Din/Extender: Skipper Cliche in Treatment: 76 Visit Information History Since Last Visit All ordered tests and consults were completed: No Patient Arrived: [name] Added or deleted any medications: No Arrival Time: 09:15 Any new allergies or adverse reactions: No Transfer Assistance: None Had a fall or experienced change in No Patient Requires Transmission-Based Precautions: No activities of daily living that may affect Patient Has Alerts: No risk of falls: Hospitalized since last visit: No Pain Present Now: No Electronic Signature(s) Signed: 11/13/2021 1:10:36 PM By: Massie Kluver Entered By: Massie Kluver on 11/13/2021 09:16:08 [name], [name] ([id number]) -------------------------------------------------------------------------------- Clinic Level of Care Assessment Details Patient Name: [name] Date of Service: 11/13/2021 9:00 AM Medical Record Number: [id number] Patient Account Number: [id number] Date of Birth/Sex: 10-24-1940 (81 y.o. F) Treating RN: Cornell Barman Primary Care Wenonah Milo: Claris Gower Other Clinician: Massie Kluver Referring Kodie Kishi: Claris Gower Treating Morgann Woodburn/Extender: Skipper Cliche in Treatment: 30 Clinic Level of Care Assessment Items TOOL 1 Quantity Score _0  - Use when EandM and Procedure is performed on INITIAL visit 0 ASSESSMENTS - Nursing Assessment / Reassessment _1  - General Physical Exam (combine w/ comprehensive assessment (listed just below) when performed on new 0 pt. evals) _2  - 0 Comprehensive Assessment (HX, ROS,  Risk Assessments, Wounds Hx, etc.) ASSESSMENTS - Wound and Skin Assessment / Reassessment _3  - Dermatologic / Skin Assessment (not related to wound area) 0 ASSESSMENTS - Ostomy and/or Continence Assessment and Care _4  - Incontinence Assessment and Management 0 _5  - 0 Ostomy Care Assessment and Management (repouching, etc.) PROCESS - Coordination of Care _6  - Simple Patient / Family Education for ongoing care 0 _7  - 0 Complex (extensive) Patient / Family Education for ongoing care _8  - 0 Staff obtains Programmer, systems, Records, Test Results / Process Orders _9  - 0 Staff telephones HHA, Nursing Homes / Clarify orders / etc _10  - 0 Routine Transfer to another Facility (non-emergent condition) _11  - 0 Routine Hospital Admission (non-emergent condition) _12  - 0 New Admissions / Biomedical engineer / Ordering NPWT, Apligraf, etc. _13  - 0 Emergency Hospital Admission (emergent condition) PROCESS - Special Needs _14  - Pediatric / Minor Patient Management 0 _15  - 0 Isolation Patient Management _16  - 0 Hearing / Language / Visual special needs _17  - 0 Assessment of Community assistance (transportation, D/C planning, etc.) _18  - 0 Additional assistance / Altered mentation _19  - 0 Support Surface(s) Assessment (bed, cushion, seat, etc.) INTERVENTIONS - Miscellaneous _20  - External ear exam 0 _21  - 0 Patient Transfer (multiple staff / Civil Service fast streamer / Similar devices) _22  - 0 Simple Staple / Suture removal (25 or less) _23  - 0 Complex Staple / Suture removal (26 or more) _24  - 0 Hypo/Hyperglycemic Management (do not check if billed separately) _25  - 0 Ankle / Brachial Index (ABI) - do not check if billed separately Has the patient been seen at the hospital within the last three years: Yes Total Score: 0 Level Of Care: ____ [name] ([id number]) Electronic Signature(s) Signed: 11/13/2021 1:10:36 PM By: Massie Kluver Entered By: Massie Kluver on 11/13/2021 09:46:40 [name], [name]  ([id number]) -------------------------------------------------------------------------------- Encounter Discharge Information Details Patient Name: [name] Date of Service: 11/13/2021 9:00 AM Medical  Record Number: [id number] Patient Account Number: [id number] Date of Birth/Sex: 1940-03-21 (81 y.o. F) Treating RN: Cornell Barman Primary Care Charlotte Brafford: Claris Gower Other Clinician: Massie Kluver Referring Irena Gaydos: Claris Gower Treating Khiyan Crace/Extender: Skipper Cliche in Treatment: 30 Encounter Discharge Information Items Post Procedure Vitals Discharge Condition: Stable Temperature (F): 98.0 Ambulatory Status: Walker Pulse (bpm): 84 Discharge Destination: Home Respiratory Rate (breaths/min): 18 Transportation: Private Auto Blood Pressure (mmHg): 160/78 Accompanied By: self Schedule Follow-up Appointment: Yes Clinical Summary of Care: Electronic Signature(s) Signed: 11/13/2021 1:10:36 PM By: Massie Kluver Entered By: Massie Kluver on 11/13/2021 10:27:21 [name], [name] ([id number]) -------------------------------------------------------------------------------- Lower Extremity Assessment Details Patient Name: [name] Date of Service: 11/13/2021 9:00 AM Medical Record Number: [id number] Patient Account Number: [id number] Date of Birth/Sex: 04-15-1940 (81 y.o. F) Treating RN: Cornell Barman Primary Care Oluwateniola Leitch: Claris Gower Other Clinician: Massie Kluver Referring Earleen Aoun: Claris Gower Treating Latrisa Hellums/Extender: Jeri Cos Weeks in Treatment: 30 Edema Assessment Assessed: Shirlyn Goltz: Yes] Patrice Paradise: No] Edema: [Left: Ye] [Right: s] Calf Left: Right: Point of Measurement: 39 cm From Medial Instep 38.3 cm Ankle Left: Right: Point of Measurement: 10 cm From Medial Instep 26.2 cm Vascular Assessment Pulses: Dorsalis Pedis Palpable: [Left:Yes] Electronic Signature(s) Signed: 11/13/2021 1:10:36 PM By: Massie Kluver Signed: 11/16/2021 8:05:30 AM By:  Gretta Cool, BSN, RN, CWS, [name] RN, BSN Entered By: Massie Kluver on 11/13/2021 09:27:48 [name], [name] ([id number]) -------------------------------------------------------------------------------- Multi Wound Chart Details Patient Name: [name] Date of Service: 11/13/2021 9:00 AM Medical Record Number: [id number] Patient Account Number: [id number] Date of Birth/Sex: February 23, 1940 (81 y.o. F) Treating RN: Cornell Barman Primary Care Zyion Leidner: Claris Gower Other Clinician: Massie Kluver Referring Metzli Pollick: Claris Gower Treating Wasil Wolke/Extender: Skipper Cliche in Treatment: 30 Vital Signs Height(in): 47 Pulse(bpm): 17 Weight(lbs): 250 Blood Pressure(mmHg): 160/78 Body Mass Index(BMI): 47.2 Temperature(F): 98.0 Respiratory Rate(breaths/min): 18 Photos: [N/A:N/A] Wound Location: Left Toe Great N/A N/A Wounding Event: Gradually Appeared N/A N/A Primary Etiology: Diabetic Wound/Ulcer of the Lower N/A N/A Extremity Comorbid History: Chronic sinus problems/congestion, N/A N/A Anemia, Sleep Apnea, Congestive Heart Failure, Hypertension, Type II Diabetes, Neuropathy Date Acquired: 02/23/2020 N/A N/A Weeks of Treatment: 30 N/A N/A Wound Status: Open N/A N/A Wound Recurrence: No N/A N/A Measurements L x W x D (cm) 0.7x0.4x0.3 N/A N/A Area (cm) : 0.22 N/A N/A Volume (cm) : 0.066 N/A N/A % Reduction in Area: 95.30% N/A N/A % Reduction in Volume: 86.00% N/A N/A Classification: Grade 1 N/A N/A Exudate Amount: Medium N/A N/A Exudate Type: Serosanguineous N/A N/A Exudate Color: red, brown N/A N/A Wound Margin: Thickened N/A N/A Granulation Amount: Large (67-100%) N/A N/A Granulation Quality: Pink N/A N/A Necrotic Amount: Small (1-33%) N/A N/A Exposed Structures: Fat Layer (Subcutaneous Tissue): N/A N/A Yes Epithelialization: Small (1-33%) N/A N/A Treatment Notes Electronic Signature(s) Signed: 11/13/2021 1:10:36 PM By: Massie Kluver Entered By: Massie Kluver on 11/13/2021  09:28:02 [name], [name] ([id number]) -------------------------------------------------------------------------------- Multi-Disciplinary Care Plan Details Patient Name: [name] Date of Service: 11/13/2021 9:00 AM Medical Record Number: [id number] Patient Account Number: [id number] Date of Birth/Sex: June 14, 1940 (81 y.o. F) Treating RN: Cornell Barman Primary Care Jaxon Flatt: Claris Gower Other Clinician: Massie Kluver Referring Kourtnie Sachs: Claris Gower Treating Latonya Knight/Extender: Skipper Cliche in Treatment: 30 Active Inactive Necrotic Tissue Nursing Diagnoses: Impaired tissue integrity related to necrotic/devitalized tissue Knowledge deficit related to management of necrotic/devitalized tissue Goals: Necrotic/devitalized tissue will be minimized in the wound bed Date Initiated: 11/06/2021 Target Resolution Date: 11/06/2021 Goal Status: Active Patient/caregiver will verbalize understanding of reason and process for debridement  of necrotic tissue Date Initiated: 11/06/2021 Target Resolution Date: 11/06/2021 Goal Status: Active Interventions: Assess patient pain level pre-, during and post procedure and prior to discharge Provide education on necrotic tissue and debridement process Treatment Activities: Apply topical anesthetic as ordered : 11/06/2021 Excisional debridement : 11/06/2021 Notes: Wound/Skin Impairment Nursing Diagnoses: Impaired tissue integrity Knowledge deficit related to ulceration/compromised skin integrity Goals: Ulcer/skin breakdown will have a volume reduction of 30% by week 4 Date Initiated: 04/17/2021 Date Inactivated: 07/03/2021 Target Resolution Date: 05/15/2021 Goal Status: Unmet Unmet Reason: comorbities Ulcer/skin breakdown will have a volume reduction of 50% by week 8 Date Initiated: 04/17/2021 Date Inactivated: 07/03/2021 Target Resolution Date: 06/12/2021 Goal Status: Met Ulcer/skin breakdown will have a volume reduction of 80% by week  12 Date Initiated: 04/17/2021 Target Resolution Date: 07/10/2021 Goal Status: Active Ulcer/skin breakdown will heal within 14 weeks Date Initiated: 04/17/2021 Target Resolution Date: 07/24/2021 Goal Status: Active Interventions: Assess patient/caregiver ability to obtain necessary supplies Assess patient/caregiver ability to perform ulcer/skin care regimen upon admission and as needed Assess ulceration(s) every visit Provide education on ulcer and skin care Notes: CAMERA, KRIENKE ([id number]) Electronic Signature(s) Signed: 11/13/2021 1:10:36 PM By: Massie Kluver Signed: 11/16/2021 8:05:30 AM By: Gretta Cool, BSN, RN, CWS, [name] RN, BSN Entered By: Massie Kluver on 11/13/2021 09:27:55 [name], [name] ([id number]) -------------------------------------------------------------------------------- Pain Assessment Details Patient Name: [name] Date of Service: 11/13/2021 9:00 AM Medical Record Number: [id number] Patient Account Number: [id number] Date of Birth/Sex: 21-Jan-1941 (81 y.o. F) Treating RN: Cornell Barman Primary Care Annika Selke: Claris Gower Other Clinician: Massie Kluver Referring Steffan Caniglia: Claris Gower Treating Linley Moskal/Extender: Skipper Cliche in Treatment: 30 Active Problems Location of Pain Severity and Description of Pain Patient Has Paino No Site Locations Pain Management and Medication Current Pain Management: Electronic Signature(s) Signed: 11/13/2021 1:10:36 PM By: Massie Kluver Signed: 11/16/2021 8:05:30 AM By: Gretta Cool, BSN, RN, CWS, [name] RN, BSN Entered By: Massie Kluver on 11/13/2021 09:22:04 [name], [name] ([id number]) -------------------------------------------------------------------------------- Patient/Caregiver Education Details Patient Name: [name] Date of Service: 11/13/2021 9:00 AM Medical Record Number: [id number] Patient Account Number: [id number] Date of Birth/Gender: November 25, 1940 (81 y.o. F) Treating RN: Cornell Barman Primary Care Physician:  Claris Gower Other Clinician: Massie Kluver Referring Physician: Claris Gower Treating Physician/Extender: Skipper Cliche in Treatment: 38 Education Assessment Education Provided To: Patient Education Topics Provided Wound/Skin Impairment: Handouts: Other: continue wound care as directed Methods: Explain/Verbal Responses: State content correctly Electronic Signature(s) Signed: 11/13/2021 1:10:36 PM By: Massie Kluver Entered By: Massie Kluver on 11/13/2021 09:53:44 [name], [name] ([id number]) -------------------------------------------------------------------------------- Wound Assessment Details Patient Name: [name] Date of Service: 11/13/2021 9:00 AM Medical Record Number: [id number] Patient Account Number: [id number] Date of Birth/Sex: 05-01-1940 (81 y.o. F) Treating RN: Cornell Barman Primary Care Amilio Zehnder: Claris Gower Other Clinician: Massie Kluver Referring Brendi Mccarroll: Claris Gower Treating Zariyah Stephens/Extender: Skipper Cliche in Treatment: 30 Wound Status Wound Number: 1 Primary Diabetic Wound/Ulcer of the Lower Extremity Etiology: Wound Location: Left Toe Great Wound Open Wounding Event: Gradually Appeared Status: Date Acquired: 02/23/2020 Comorbid Chronic sinus problems/congestion, Anemia, Sleep Apnea, Weeks Of Treatment: 30 History: Congestive Heart Failure, Hypertension, Type II Diabetes, Clustered Wound: No Neuropathy Photos Wound Measurements Length: (cm) 0.7 Width: (cm) 0.4 Depth: (cm) 0.3 Area: (cm) 0.22 Volume: (cm) 0.066 % Reduction in Area: 95.3% % Reduction in Volume: 86% Epithelialization: Small (1-33%) Wound Description Classification: Grade 1 Wound Margin: Thickened Exudate Amount: Medium Exudate Type: Serosanguineous Exudate Color: red, brown Foul Odor After Cleansing: No Slough/Fibrino Yes Wound Bed  Granulation Amount: Large (67-100%) Exposed Structure Granulation Quality: Pink Fat Layer (Subcutaneous Tissue)  Exposed: Yes Necrotic Amount: Small (1-33%) Necrotic Quality: Adherent Slough Treatment Notes Wound #1 (Toe Great) Wound Laterality: Left Cleanser [place] Ancillary Kit - 15 Day Supply Discharge Instruction: Use supplies as instructed; Kit contains: (15) Saline Bullets; (15) 3x3 Gauze; 15 pr Gloves Normal Saline Discharge Instruction: Wash your hands with soap and water. Remove old dressing, discard into plastic bag and place into trash. Cleanse the wound with Normal Saline prior to applying a clean dressing using gauze sponges, not tissues or cotton balls. Do not scrub or use excessive force. Pat dry using gauze sponges, not tissue or cotton balls. Harner, [name] ([id number]) Peri-Wound Care Topical Primary Dressing SILVERCEL Antimicrobial Alginate Dressing, 1x12 (in/in) Secondary Dressing Coverlet Latex-Free Fabric Adhesive Dressings Discharge Instruction: 1.5 x 2 Secured With Compression Wrap Compression Stockings Add-Ons Gauze Non-Bordered 4x4 (in/in) Discharge Instruction: Cover with dry gauze, rolled up and placed under left great toe Electronic Signature(s) Signed: 11/13/2021 1:10:36 PM By: Massie Kluver Signed: 11/16/2021 8:05:30 AM By: Gretta Cool, BSN, RN, CWS, [name] RN, BSN Entered By: Massie Kluver on 11/13/2021 09:26:40 Boffa, [name] ([id number]) -------------------------------------------------------------------------------- Vitals Details Patient Name: [name] Date of Service: 11/13/2021 9:00 AM Medical Record Number: [id number] Patient Account Number: [id number] Date of Birth/Sex: Aug 21, 1940 (81 y.o. F) Treating RN: Cornell Barman Primary Care Emmersen Garraway: Claris Gower Other Clinician: Massie Kluver Referring Lyndell Gillyard: Claris Gower Treating Devaris Quirk/Extender: Skipper Cliche in Treatment: 30 Vital Signs Time Taken: 09:16 Temperature (F): 98.0 Height (in): 61 Pulse (bpm): 84 Weight (lbs): 250 Respiratory Rate (breaths/min): 18 Body Mass Index (BMI):  47.2 Blood Pressure (mmHg): 160/78 Reference Range: 80 - 120 mg / dl Electronic Signature(s) Signed: 11/13/2021 1:10:36 PM By: Massie Kluver Entered By: Massie Kluver on 11/13/2021 09:21:56

## 2021-11-20 ENCOUNTER — Encounter: Payer: Medicare Other | Admitting: Physician Assistant

## 2021-11-20 DIAGNOSIS — E11621 Type 2 diabetes mellitus with foot ulcer: Secondary | ICD-10-CM | POA: Diagnosis not present

## 2021-11-20 NOTE — Progress Notes (Addendum)
[name], [name] ([id number]) Visit Report for 11/20/2021 Arrival Information Details Patient Name: [name], [name] Date of Service: 11/20/2021 9:00 AM Medical Record Number: [id number] Patient Account Number: [id number] Date of Birth/Sex: 1940/07/13 (81 y.o. F) Treating RN: [name] Primary Care [name]: [name] Other Clinician: Massie [name] Referring [name]: [name] Treating [name]/Extender: [name] in Treatment: 74 Visit Information History Since Last Visit All ordered tests and consults were completed: No Patient Arrived: [name] Added or deleted any medications: No Arrival Time: 09:08 Any new allergies or adverse reactions: No Transfer Assistance: None Had a fall or experienced change in No Patient Requires Transmission-Based Precautions: No activities of daily living that may affect Patient Has Alerts: No risk of falls: Hospitalized since last visit: No Pain Present Now: No Electronic Signature(s) Signed: 11/20/2021 12:10:36 PM By: [name] Entered By: [name] on 11/20/2021 09:08:50 [name], [name] ([id number]) -------------------------------------------------------------------------------- Clinic Level of Care Assessment Details Patient Name: [name] Date of Service: 11/20/2021 9:00 AM Medical Record Number: [id number] Patient Account Number: [id number] Date of Birth/Sex: 08/09/1940 (81 y.o. F) Treating RN: [name] Primary Care [name]: [name] Other Clinician: Massie [name] Referring [name]: [name] Treating [name]/Extender: [name] in Treatment: 31 Clinic Level of Care Assessment Items TOOL 1 Quantity Score '[]'  - Use when EandM and Procedure is performed on INITIAL visit 0 ASSESSMENTS - Nursing Assessment / Reassessment '[]'  - General Physical Exam (combine w/ comprehensive assessment (listed just below) when performed on new 0 pt. evals) '[]'  - 0 Comprehensive Assessment (HX, ROS,  Risk Assessments, Wounds Hx, etc.) ASSESSMENTS - Wound and Skin Assessment / Reassessment '[]'  - Dermatologic / Skin Assessment (not related to wound area) 0 ASSESSMENTS - Ostomy and/or Continence Assessment and Care '[]'  - Incontinence Assessment and Management 0 '[]'  - 0 Ostomy Care Assessment and Management (repouching, etc.) PROCESS - Coordination of Care '[]'  - Simple Patient / Family Education for ongoing care 0 '[]'  - 0 Complex (extensive) Patient / Family Education for ongoing care '[]'  - 0 Staff obtains Programmer, systems, Records, Test Results / Process Orders '[]'  - 0 Staff telephones HHA, Nursing Homes / Clarify orders / etc '[]'  - 0 Routine Transfer to another Facility (non-emergent condition) '[]'  - 0 Routine Hospital Admission (non-emergent condition) '[]'  - 0 New Admissions / Biomedical engineer / Ordering NPWT, Apligraf, etc. '[]'  - 0 Emergency Hospital Admission (emergent condition) PROCESS - Special Needs '[]'  - Pediatric / Minor Patient Management 0 '[]'  - 0 Isolation Patient Management '[]'  - 0 Hearing / Language / Visual special needs '[]'  - 0 Assessment of Community assistance (transportation, D/C planning, etc.) '[]'  - 0 Additional assistance / Altered mentation '[]'  - 0 Support Surface(s) Assessment (bed, cushion, seat, etc.) INTERVENTIONS - Miscellaneous '[]'  - External ear exam 0 '[]'  - 0 Patient Transfer (multiple staff / Civil Service fast streamer / Similar devices) '[]'  - 0 Simple Staple / Suture removal (25 or less) '[]'  - 0 Complex Staple / Suture removal (26 or more) '[]'  - 0 Hypo/Hyperglycemic Management (do not check if billed separately) '[]'  - 0 Ankle / Brachial Index (ABI) - do not check if billed separately Has the patient been seen at the hospital within the last three years: Yes Total Score: 0 Level Of Care: ____ [name] ([id number]) Electronic Signature(s) Signed: 11/20/2021 12:10:36 PM By: [name] Entered By: [name] on 11/20/2021 09:41:50 [name], [name]  ([id number]) -------------------------------------------------------------------------------- Encounter Discharge Information Details Patient Name: [name] Date of Service: 11/20/2021 9:00 AM Medical  Record Number: [id number] Patient Account Number: [id number] Date of Birth/Sex: 1941-01-08 (81 y.o. F) Treating RN: [name] Primary Care [name]: [name] Other Clinician: Massie [name] Referring [name]: [name] Treating [name]/Extender: [name] in Treatment: 31 Encounter Discharge Information Items Post Procedure Vitals Discharge Condition: Stable Temperature (F): 98.0 Ambulatory Status: Cane Pulse (bpm): 87 Discharge Destination: Home Respiratory Rate (breaths/min): 18 Transportation: Private Auto Blood Pressure (mmHg): 170/77 Accompanied By: self Schedule Follow-up Appointment: Yes Clinical Summary of Care: Electronic Signature(s) Signed: 11/20/2021 12:10:36 PM By: [name] Entered By: [name] on 11/20/2021 09:52:33 [name], [name] ([id number]) -------------------------------------------------------------------------------- Lower Extremity Assessment Details Patient Name: [name] Date of Service: 11/20/2021 9:00 AM Medical Record Number: [id number] Patient Account Number: [id number] Date of Birth/Sex: 1940-03-12 (81 y.o. F) Treating RN: [name] Primary Care [name]: [name] Other Clinician: Massie [name] Referring [name]: [name] Treating [name]/Extender: [name] in Treatment: 31 Edema Assessment Assessed: [Left: Yes] [Right: No] Edema: [Left: Ye] [Right: s] Calf Left: Right: Point of Measurement: 39 cm From Medial Instep 38.4 cm Ankle Left: Right: Point of Measurement: 10 cm From Medial Instep 25.5 cm Vascular Assessment Pulses: Dorsalis Pedis Palpable: [Left:Yes] Posterior Tibial Palpable: [Left:Yes] Electronic Signature(s) Signed: 11/20/2021 10:43:45 AM By: Cheryl Coria RN Signed: 11/20/2021 12:10:36 PM By: [name] Entered By: [name] on 11/20/2021 09:18:28 [name], [name] ([id number]) -------------------------------------------------------------------------------- Multi Wound Chart Details Patient Name: [name] Date of Service: 11/20/2021 9:00 AM Medical Record Number: [id number] Patient Account Number: [id number] Date of Birth/Sex: [birth year] (81 y.o. F) Treating RN: [name] Primary Care Devonia Farro: [name] Other Clinician: Massie [name] Referring Torianne Laflam: [name] Treating Wilman Tucker/Extender: [name] in Treatment: 31 Vital Signs Height(in): 61 Pulse(bpm): 34 Weight(lbs): 250 Blood Pressure(mmHg): 170/77 Body Mass Index(BMI): 47.2 Temperature(F): 98.0 Respiratory Rate(breaths/min): 18 Photos: [N/A:N/A] Wound Location: Left Toe Great N/A N/A Wounding Event: Gradually Appeared N/A N/A Primary Etiology: Diabetic Wound/Ulcer of the Lower N/A N/A Extremity Comorbid History: Chronic sinus problems/congestion, N/A N/A Anemia, Sleep Apnea, Congestive Heart Failure, Hypertension, Type II Diabetes, Neuropathy Date Acquired: 02/23/2020 N/A N/A Weeks of Treatment: 31 N/A N/A Wound Status: Open N/A N/A Wound Recurrence: No N/A N/A Measurements L x W x D (cm) 0.2x0.4x0.2 N/A N/A Area (cm) : 0.063 N/A N/A Volume (cm) : 0.013 N/A N/A % Reduction in Area: 98.70% N/A N/A % Reduction in Volume: 97.20% N/A N/A Classification: Grade 1 N/A N/A Exudate Amount: Medium N/A N/A Exudate Type: Serosanguineous N/A N/A Exudate Color: red, brown N/A N/A Wound Margin: Thickened N/A N/A Granulation Amount: Large (67-100%) N/A N/A Granulation Quality: Pink N/A N/A Necrotic Amount: Small (1-33%) N/A N/A Exposed Structures: Fat Layer (Subcutaneous Tissue): N/A N/A Yes Epithelialization: Small (1-33%) N/A N/A Treatment Notes Electronic Signature(s) Signed: 11/20/2021 12:10:36 PM By: [name] Entered  By: [name] on 11/20/2021 09:18:49 Theroux, [name] ([id number]) -------------------------------------------------------------------------------- Multi-Disciplinary Care Plan Details Patient Name: [name] Date of Service: 11/20/2021 9:00 AM Medical Record Number: [id number] Patient Account Number: [id number] Date of Birth/Sex: 07/26/1940 (81 y.o. F) Treating RN: [name] Primary Care Reighlyn Elmes: [name] Other Clinician: Massie [name] Referring Liticia Gasior: [name] Treating Happy Ky/Extender: [name] in Treatment: 31 Active Inactive Necrotic Tissue Nursing Diagnoses: Impaired tissue integrity related to necrotic/devitalized tissue Knowledge deficit related to management of necrotic/devitalized tissue Goals: Necrotic/devitalized tissue will be minimized in the wound bed Date Initiated: 11/06/2021 Target Resolution Date: 11/06/2021 Goal Status: Active Patient/caregiver will verbalize understanding of reason and process for debridement  of necrotic tissue Date Initiated: 11/06/2021 Target Resolution Date: 11/06/2021 Goal Status: Active Interventions: Assess patient pain level pre-, during and post procedure and prior to discharge Provide education on necrotic tissue and debridement process Treatment Activities: Apply topical anesthetic as ordered : 11/06/2021 Excisional debridement : 11/06/2021 Notes: Wound/Skin Impairment Nursing Diagnoses: Impaired tissue integrity Knowledge deficit related to ulceration/compromised skin integrity Goals: Ulcer/skin breakdown will have a volume reduction of 30% by week 4 Date Initiated: 04/17/2021 Date Inactivated: 07/03/2021 Target Resolution Date: 05/15/2021 Goal Status: Unmet Unmet Reason: comorbities Ulcer/skin breakdown will have a volume reduction of 50% by week 8 Date Initiated: 04/17/2021 Date Inactivated: 07/03/2021 Target Resolution Date: 06/12/2021 Goal Status: Met Ulcer/skin breakdown will have a  volume reduction of 80% by week 12 Date Initiated: 04/17/2021 Target Resolution Date: 07/10/2021 Goal Status: Active Ulcer/skin breakdown will heal within 14 weeks Date Initiated: 04/17/2021 Target Resolution Date: 07/24/2021 Goal Status: Active Interventions: Assess patient/caregiver ability to obtain necessary supplies Assess patient/caregiver ability to perform ulcer/skin care regimen upon admission and as needed Assess ulceration(s) every visit Provide education on ulcer and skin care Notes: MIRIANA, GAERTNER ([id number]) Electronic Signature(s) Signed: 11/20/2021 10:43:45 AM By: Cheryl Coria RN Signed: 11/20/2021 12:10:36 PM By: [name] Entered By: [name] on 11/20/2021 09:18:33 Brassfield, [name] ([id number]) -------------------------------------------------------------------------------- Pain Assessment Details Patient Name: [name] Date of Service: 11/20/2021 9:00 AM Medical Record Number: [id number] Patient Account Number: [id number] Date of Birth/Sex: [birth year] (81 y.o. F) Treating RN: [name] Primary Care Akiem Urieta: [name] Other Clinician: Massie [name] Referring Kasen Adduci: [name] Treating Emmersen Garraway/Extender: [name] in Treatment: 31 Active Problems Location of Pain Severity and Description of Pain Patient Has Paino No Site Locations Pain Management and Medication Current Pain Management: Electronic Signature(s) Signed: 11/20/2021 10:43:45 AM By: Cheryl Coria RN Signed: 11/20/2021 12:10:36 PM By: [name] Entered By: [name] on 11/20/2021 09:11:53 Kinnear, [name] ([id number]) -------------------------------------------------------------------------------- Patient/Caregiver Education Details Patient Name: [name] Date of Service: 11/20/2021 9:00 AM Medical Record Number: [id number] Patient Account Number: [id number] Date of Birth/Gender: 1940-05-22 (81 y.o. F) Treating RN: [name] Primary Care  Physician: [name] Other Clinician: Massie [name] Referring Physician: Claris [name] Treating Physician/Extender: [name] in Treatment: 53 Education Assessment Education Provided To: Patient Education Topics Provided Wound/Skin Impairment: Handouts: Other: continue wound care as directed Methods: Explain/Verbal Responses: State content correctly Electronic Signature(s) Signed: 11/20/2021 12:10:36 PM By: [name] Entered By: [name] on 11/20/2021 09:42:19 Lebeda, [name] ([id number]) -------------------------------------------------------------------------------- Wound Assessment Details Patient Name: [name] Date of Service: 11/20/2021 9:00 AM Medical Record Number: [id number] Patient Account Number: [id number] Date of Birth/Sex: [birth year] (81 y.o. F) Treating RN: [name] Primary Care Jabri Blancett: [name] Other Clinician: Massie [name] Referring Brayley Mackowiak: [name] Treating Ainslie Mazurek/Extender: [name] in Treatment: 31 Wound Status Wound Number: 1 Primary Diabetic Wound/Ulcer of the Lower Extremity Etiology: Wound Location: Left Toe Great Wound Open Wounding Event: Gradually Appeared Status: Date Acquired: 02/23/2020 Comorbid Chronic sinus problems/congestion, Anemia, Sleep Apnea, Weeks Of Treatment: 31 History: Congestive Heart Failure, Hypertension, Type II Diabetes, Clustered Wound: No Neuropathy Photos Wound Measurements Length: (cm) 0.2 Width: (cm) 0.4 Depth: (cm) 0.2 Area: (cm) 0.063 Volume: (cm) 0.013 % Reduction in Area: 98.7% % Reduction in Volume: 97.2% Epithelialization: Small (1-33%) Wound Description Classification: Grade 1 Wound Margin: Thickened Exudate Amount: Medium Exudate Type: Serosanguineous Exudate Color: red, brown Foul Odor After Cleansing: No Slough/Fibrino Yes Wound Bed Granulation Amount: Large (67-100%) Exposed Structure Granulation Quality:  Pink Fat Layer  (Subcutaneous Tissue) Exposed: Yes Necrotic Amount: Small (1-33%) Necrotic Quality: Adherent Slough Treatment Notes Wound #1 (Toe Great) Wound Laterality: Left Cleanser [place] Ancillary Kit - 15 Day Supply Discharge Instruction: Use supplies as instructed; Kit contains: (15) Saline Bullets; (15) 3x3 Gauze; 15 pr Gloves Normal Saline Discharge Instruction: Wash your hands with soap and water. Remove old dressing, discard into plastic bag and place into trash. Cleanse the wound with Normal Saline prior to applying a clean dressing using gauze sponges, not tissues or cotton balls. Do not scrub or use excessive force. Pat dry using gauze sponges, not tissue or cotton balls. Zamorano, [name] ([id number]) Peri-Wound Care Topical Primary Dressing SILVERCEL Antimicrobial Alginate Dressing, 1x12 (in/in) Secondary Dressing Coverlet Latex-Free Fabric Adhesive Dressings Discharge Instruction: 1.5 x 2 Secured With Compression Wrap Compression Stockings Add-Ons Gauze Non-Bordered 4x4 (in/in) Discharge Instruction: Cover with dry gauze, rolled up and placed under left great toe Electronic Signature(s) Signed: 11/20/2021 10:43:45 AM By: Cheryl Coria RN Signed: 11/20/2021 12:10:36 PM By: [name] Entered By: [name] on 11/20/2021 09:16:56 Siemon, [name] ([id number]) -------------------------------------------------------------------------------- Vitals Details Patient Name: [name] Date of Service: 11/20/2021 9:00 AM Medical Record Number: [id number] Patient Account Number: [id number] Date of Birth/Sex: [birth year] (81 y.o. F) Treating RN: [name] Primary Care Zemirah Krasinski: [name] Other Clinician: Massie [name] Referring Sheilah Rayos: [name] Treating Jamonta Goerner/Extender: [name] in Treatment: 31 Vital Signs Time Taken: 09:11 Temperature (F): 98.0 Height (in): 61 Pulse (bpm): 87 Weight (lbs): 250 Respiratory Rate (breaths/min): 18 Body Mass Index  (BMI): 47.2 Blood Pressure (mmHg): 170/77 Reference Range: 80 - 120 mg / dl Electronic Signature(s) Signed: 11/20/2021 12:10:36 PM By: [name] Entered By: [name] on 11/20/2021 09:11:36

## 2021-11-20 NOTE — Progress Notes (Addendum)
[name] ([id number]) Visit Report for 11/20/2021 Chief Complaint Document Details Patient Name: [name] Date of Service: 11/20/2021 9:00 AM Medical Record Number: [id number] Patient Account Number: [id number] Date of Birth/Sex: 1940/12/09 (81 y.o. F) Treating RN: Carlene Coria Primary Care Provider: Claris Gower Other Clinician: Massie Kluver Referring Provider: Claris Gower Treating Provider/Extender: Skipper Cliche in Treatment: 31 Information Obtained from: Patient Chief Complaint Multiple toe ulcersations Electronic Signature(s) Signed: 11/20/2021 8:58:49 AM By: Worthy Keeler PA-C Entered By: Worthy Keeler on 11/20/2021 08:58:49 [name] ([id number]) -------------------------------------------------------------------------------- Debridement Details Patient Name: [name] Date of Service: 11/20/2021 9:00 AM Medical Record Number: [id number] Patient Account Number: [id number] Date of Birth/Sex: 1940-03-02 (81 y.o. F) Treating RN: Carlene Coria Primary Care Provider: Claris Gower Other Clinician: Massie Kluver Referring Provider: Claris Gower Treating Provider/Extender: Skipper Cliche in Treatment: 31 Debridement Performed for Wound #1 Left Toe Great Assessment: Performed By: Physician Tommie Sams., PA-C Debridement Type: Debridement Severity of Tissue Pre Debridement: Fat layer exposed Level of Consciousness (Pre- Awake and Alert procedure): Pre-procedure Verification/Time Out Yes - 09:38 Taken: Start Time: 09:38 Total Area Debrided (L x W): 1 (cm) x 1 (cm) = 1 (cm) Tissue and other material Viable, Non-Viable, Callus, Slough, Subcutaneous, Slough debrided: Level: Skin/Subcutaneous Tissue Debridement Description: Excisional Instrument: Curette Bleeding: Minimum Hemostasis Achieved: Pressure End Time: 09:42 Response to Treatment: Procedure was tolerated well Level of Consciousness (Post- Awake and  Alert procedure): Post Debridement Measurements of Total Wound Length: (cm) 0.3 Width: (cm) 0.5 Depth: (cm) 0.3 Volume: (cm) 0.035 Character of Wound/Ulcer Post Debridement: Stable Severity of Tissue Post Debridement: Necrosis of muscle Post Procedure Diagnosis Same as Pre-procedure Electronic Signature(s) Signed: 11/20/2021 10:43:45 AM By: Carlene Coria RN Signed: 11/20/2021 11:37:09 AM By: Worthy Keeler PA-C Signed: 11/20/2021 12:10:36 PM By: Massie Kluver Entered By: Massie Kluver on 11/20/2021 09:41:32 [name] ([id number]) -------------------------------------------------------------------------------- HPI Details Patient Name: [name] Date of Service: 11/20/2021 9:00 AM Medical Record Number: [id number] Patient Account Number: [id number] Date of Birth/Sex: [birth year] (81 y.o. F) Treating RN: Carlene Coria Primary Care Provider: Claris Gower Other Clinician: Massie Kluver Referring Provider: Claris Gower Treating Provider/Extender: Skipper Cliche in Treatment: 31 History of Present Illness HPI Description: 04/17/2021 upon evaluation patient appears to be doing somewhat poorly currently in regard to her first and second toe of the left foot. She previously has been seen by Dr. Doran Durand she saw him on 31 January he did not feel there was any evidence of osteomyelitis. He did give her a thorough evaluation including x-rays and showed no abnormal findings according to notes. With that being said he felt like that wound care would be beneficial therefore she contacted [place]. She has currently been using antibiotic ointment and has noted that this wound on the great toe has been there for about a year. She is not certain whether there is anything on the second toe or not she has not really noted any drainage but at the same time she cannot be sure there is nothing hiding up underneath a significant callus here. Her feet really do not have any major complications with  regard to pain and that is good news. She does have neuropathy. Patient does have a history of diabetes mellitus type 2, hypertension, and her most recent hemoglobin A1c was 7.6 on June 2022. 05/08/2021 upon evaluation today patient appears to be doing poorly in regard to her toes unfortunately she has a lot of callus buildup. Its  been almost a month since I last saw her. Obviously in this amount of time she has built up quite a bit of callus she was sick that is the reason she did not make it in. Nonetheless I do think that weekly visits is probably can be the best thing is asking to help to allow these areas to heal much more effectively and quickly. Fortunately I do not see any evidence of active infection locally or systemically which is great news. 05/15/2021 upon evaluation today patient appears to be doing well currently in regard to her wound. Overall I think that we are definitely headed in the right direction with regard to the toes. Fortunately I do not see any signs of active infection locally or systemically at this time which is great news. No fevers, chills, nausea, vomiting, or diarrhea. 05/22/2021 upon evaluation today patient appears to be doing well with regard to her wound. She has been tolerating the dressing changes without complication. Fortunately the second toe is healed the first toe on the left foot is still open but does not appear to be doing nearly as poorly at this time. I do think we can perform a little bit of debridement in order to clear away some of the necrotic debris patient is in agreement with that plan. 05-29-2021 upon evaluation today patient appears to be doing worse currently in regard to the pain experience. Her leg is still very red in fact I think is a little bit worse than last week unfortunately. Last week I was even concerned about a little bit of cellulitis she really felt like it was more related to her swelling but again I am not certain that that is  necessarily the case. Fortunately I do not see any signs of active infection at this time systemically though locally I am definitely concerned in this regard. 06-05-2021 upon evaluation today patient appears to be doing well with regard to her toe ulcer this is worse is also not better though. Unfortunately it is hurting more in her leg in general is also bothering her. This is despite being. She is also tolerating Levaquin with making her dizzy. Nonetheless I am not certain its helping after she has been on this week I am not seeing much of the improvement with regard to the cellulitis in her leg. She is also previously been on doxycycline although that was towards the end of February. 06-12-2021 upon evaluation today patient appears to be doing well with regard to her wound. We are definitely seeing signs of improvement with regard to size. With that being said I do not see any evidence of active infection at this time which is great news and very pleased in that regard. She is going require some debridement both for callus as well as clearing away some of the slough and biofilm. 07-03-2021 upon evaluation today patient appears to be doing well with regard to his wound on the foot. With that being said he unfortunately did have some of the padding along the heel that got pushed down and subsequently he tells me that it was feeling like it was bunched up under his foot and the heel location and bothering him over the past couple of days. With that being said that when we remove the cast today unbeknownst to [facility] the padding had been pushed down and the cast saw actually nicked his heel this looks like a very light abrasion. Nonetheless he does not have any pain and there is no  active bleeding at this time. I actually did take a picture to show him as well and we documented it we will see how this looks obviously next week but I think this will be healed by Wednesday. The wound itself looks to be  doing much better there is some callus to remove some slough and biofilm as well. 07-10-2021 upon evaluation today patient appears to be doing well with regard to her wounds. She has been tolerating the dressing changes without complication. Actually I feel like the foot is looking a little better in regard to the great toe. Everything else is doing better as well. I feel like she has been much more careful with that and from the callus standpoint things are greatly improved it also helps that she shows up on a regular basis. With that being said we are going to be seeing her following her MRI that scheduled for next week. 07-24-2021 upon evaluation today patient appears to be doing well with regard to her wound. She actually is shown signs of improvement there are still some callus not nearly as bad as what it is then I think we need to try to see what we can do to get some of this off today but hopefully will continue to show signs of improvement with the current regimen. She has thought about the hyperbarics she tells me there is just really not can be a way for her to get here regularly for that. 07-31-2021 upon evaluation today patient appears to be doing well with regard to her toe ulcer although its not significantly smaller its not worse either. She still has a lot of callus buildup. With that being said I think we need to do something to try to keep pressure off of the toe. I think a bolster underneath the big toe could be beneficial in this regard. 08-14-2021 upon evaluation today patient's wound actually showed signs of some improvement she still had callus but for 2 weeks this was not bad at all compared to what we normally find. I do think we are on the right track here and the good news is she seems to be making some excellent progress in my opinion. 08-21-2021 upon evaluation today patient appears to be doing well with regard to her toe ulcer. Unfortunately her silicone toe spacer was too  tight Righi, Edia A. ([id number]) for her toe by the time she got home and was already causing her pain. For that reason she has not been wearing that she is putting the rolled up gauze up underneath. Obviously I think this is still a good way to go and better than nothing the biggest thing is that she still is walking without shoes at home I discussed this with her previously but again I have that discussion again today I want her to have shoes on at all times she has been going around just in her "sock feet". 10-02-2021 upon evaluation today patient appears to be doing poorly in regard to her toe. She is actually been in sinus June 30 when I last saw her. She has had a lot of issues with her kidneys and then she also had her little puppy passed away she tells me. Unfortunately I think this has weighed heavily on her. Fortunately there does not appear to be any evidence of active infection at this time which is great news. No fevers, chills, nausea, vomiting, or diarrhea. 10-09-2021 upon evaluation today patient appears to be doing well currently in regard  to her wound. She has been tolerating the dressing changes without complication. Fortunately there does not appear to be any signs of active infection locally or systemically at this time which is great news. No fevers, chills, nausea, vomiting, or diarrhea. 10-16-2021 upon evaluation today patient appears to be doing well with regard to her toe ulcer I really feel like this is showing signs of improvement which is great news. Fortunately there does not appear to be any evidence of infection locally or systemically which is great news. No fevers, chills, nausea, vomiting, or diarrhea. 10-23-2021 upon evaluation today patient's wound is showing signs of still having quite a bit of callus around the edge we are making a bolster to go underneath the toe but she still has quite a bit of drainage noted at this point. Fortunately there does not appear to  be any signs of infection. 10-30-2021 upon evaluation today patient appears to be doing well currently in regard to her wound. She has been tolerating the dressing changes without complication. Fortunately there does not appear to be any evidence of active infection locally or systemically at this time. No fevers, chills, nausea, vomiting, or diarrhea. 11-06-2021 upon evaluation today patient appears to be doing well currently in regard to her toe although it is about the same as may be slightly smaller. Fortunately I do not see any evidence of infection currently which is good news. 11-13-2021 upon evaluation today patient's wound actually appears to be very macerated compared to previous. Fortunately I do not see any signs of active infection locally or systemically at this time which is great news. Nonetheless I do feel like that she has a lot of pressure issue here. We have previously done an x-ray and MRI which showed no signs of osteomyelitis. We have also done arterial studies to ensure blood flow was good pulmonary only this shows triphasic waveforms with an ABI of 1.08 which is excellent. That was just completed and in fact the plan final report is not even back yet. Nonetheless overall I think that none of this is a complicating factor for her I think the biggest issue is friction and pressure. That is what we have to figure out a more appropriately managed unfortunately I do not think she is a good candidate for total contact casting due to her balance. 11-20-2021 upon evaluation today patient appears to be doing well currently in regard to her toe ulcer. She has been tolerating the dressing changes without complication. Fortunately there does not appear to be any evidence of active infection locally or systemically at this time which is great news. I am actually very pleased with how things appear. Electronic Signature(s) Signed: 11/20/2021 10:06:29 AM By: Worthy Keeler PA-C Entered By:  Worthy Keeler on 11/20/2021 10:06:29 Arthurs, [name] ([id number]) -------------------------------------------------------------------------------- Physical Exam Details Patient Name: [name] Date of Service: 11/20/2021 9:00 AM Medical Record Number: [id number] Patient Account Number: [id number] Date of Birth/Sex: 1940/08/07 (81 y.o. F) Treating RN: Carlene Coria Primary Care Provider: Claris Gower Other Clinician: Massie Kluver Referring Provider: Claris Gower Treating Provider/Extender: Skipper Cliche in Treatment: 75 Constitutional Well-nourished and well-hydrated in no acute distress. Respiratory normal breathing without difficulty. Psychiatric this patient is able to make decisions and demonstrates good insight into disease process. Alert and Oriented x 3. pleasant and cooperative. Notes Patient's wound bed actually showed signs of good granulation and epithelization at this point. Fortunately I see no evidence of active infection locally or systemically which is  great news and overall I am extremely pleased with where we stand. Electronic Signature(s) Signed: 11/20/2021 10:06:42 AM By: Worthy Keeler PA-C Entered By: Worthy Keeler on 11/20/2021 10:06:42 [name] ([id number]) -------------------------------------------------------------------------------- Physician Orders Details Patient Name: [name] Date of Service: 11/20/2021 9:00 AM Medical Record Number: [id number] Patient Account Number: [id number] Date of Birth/Sex: 09-19-1940 (81 y.o. 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 o Nurse Visit as needed [employer] o Wash wounds with antibacterial soap and water. o May shower; gently cleanse wound with antibacterial soap, rinse and pat dry prior to dressing wounds o No tub bath. Anesthetic (Use 'Patient Medications' Section for Anesthetic Order Entry) o Lidocaine applied to wound bed Edema Control - Lymphedema / Segmental Compressive Device / Other o Elevate, Exercise Daily and Avoid Standing for Long Periods of Time. o Elevate legs to the level of the heart and pump ankles as often as possible o Elevate leg(s) parallel to the floor when sitting. o DO YOUR BEST to sleep in the bed at night. DO NOT sleep in your recliner. Long hours of sitting in a recliner leads to swelling of the legs and/or potential wounds on your backside. Off-Loading o Open toe surgical shoe - left foot Wound Treatment Wound #1 - Toe Great Wound Laterality: Left Cleanser: [place] Ancillary Kit - 15 Day Supply (Generic) Every Other Day/30 Days Discharge Instructions: Use supplies as instructed; Kit contains: (15) Saline Bullets; (15) 3x3 Gauze; 15 pr Gloves Cleanser: Normal Saline Every Other Day/30 Days Discharge Instructions: Wash your hands with soap and water. Remove old dressing, discard into plastic bag and place into trash. Cleanse the wound with Normal Saline prior to applying a clean dressing using gauze sponges, not tissues or cotton balls. Do not scrub or use excessive force. Pat dry using gauze sponges, not tissue or cotton balls. Primary Dressing: SILVERCEL Antimicrobial Alginate Dressing, 1x12 (in/in) Every Other Day/30 Days Secondary Dressing: Coverlet Latex-Free  Fabric Adhesive Dressings Every Other Day/30 Days Discharge Instructions: 1.5 x 2 Add-Ons: Gauze Non-Bordered 4x4 (in/in) Every Other Day/30 Days Discharge Instructions: Cover with dry gauze, rolled up and placed under left great toe Electronic Signature(s) Signed: 11/20/2021 11:37:09 AM By: Irean Hong [name], Darlisha AMarland Kitchen ([id number]) Signed: 11/20/2021 12:10:36 PM By: Massie Kluver Entered By: Massie Kluver on 11/20/2021 09:41:44 [name] ([id number]) -------------------------------------------------------------------------------- Problem List Details Patient Name: [name] Date of Service: 11/20/2021 9:00 AM Medical Record Number: [id number] Patient Account Number: [id number] Date of Birth/Sex: 1940-10-24 (81 y.o. 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 F) Treating RN: Carlene Coria Primary Care Provider: Claris Gower Other Clinician: Massie Kluver Referring Provider: Claris Gower Treating Provider/Extender: Skipper Cliche in Treatment: 31 Subjective Chief Complaint Information obtained from Patient Multiple toe ulcersations History of Present Illness (HPI) 04/17/2021 upon evaluation patient appears to be doing somewhat poorly currently in regard to her first and second toe of the left foot. She previously has been seen by Dr. Doran Durand she saw him on 31 January he did not feel there was any evidence of osteomyelitis. He did give her a thorough evaluation including x-rays and showed no abnormal findings according to notes. With that being said he felt like that wound care would be beneficial therefore she contacted [place]. She has currently been using antibiotic ointment and has noted that this wound on the great toe has been there for about a year. She is not certain whether there is anything on the second toe or not she has not really noted any drainage but at the same time she cannot be sure there is nothing hiding up underneath a significant callus here. Her feet really do not have any major complications with regard to pain and that is good news. She does have neuropathy. Patient does have a history of diabetes mellitus type 2, hypertension, and her most recent hemoglobin A1c was 7.6 on June 2022. 05/08/2021 upon evaluation today patient appears to be doing poorly in regard to her toes unfortunately she has a lot of callus buildup. Its been almost a month since I last saw her. Obviously in this amount of time she has built up quite a bit of callus she was sick that is the reason she did not make it in. Nonetheless I do think that weekly visits is probably can be the best thing is asking to help to allow these areas to heal much more effectively and quickly. Fortunately I do not see any  evidence of active infection locally or systemically which is great news. 05/15/2021 upon evaluation today patient appears to be doing well currently in regard to her wound. Overall I think that we are definitely headed in the right direction with regard to the toes. Fortunately I do not see any signs of active infection locally or systemically at this time which is great news. No fevers, chills, nausea, vomiting, or diarrhea. 05/22/2021 upon evaluation today patient appears to be doing well with regard to her wound. She has been tolerating the dressing changes without complication. Fortunately the second toe is healed the first toe on the left foot is still open but does not appear to be doing nearly as poorly at this time. I do think we can perform a little bit of debridement in order to clear away some of the necrotic debris patient is in agreement with that plan. 05-29-2021 upon evaluation today patient appears to be doing worse currently in regard to the pain experience. Her leg is still very red in fact I think is  a little bit worse than last week unfortunately. Last week I was even concerned about a little bit of cellulitis she really felt like it was more related to her swelling but again I am not certain that that is necessarily the case. Fortunately I do not see any signs of active infection at this time systemically though locally I am definitely concerned in this regard. 06-05-2021 upon evaluation today patient appears to be doing well with regard to her toe ulcer this is worse is also not better though. Unfortunately it is hurting more in her leg in general is also bothering her. This is despite being. She is also tolerating Levaquin with making her dizzy. Nonetheless I am not certain its helping after she has been on this week I am not seeing much of the improvement with regard to the cellulitis in her leg. She is also previously been on doxycycline although that was towards the end of  February. 06-12-2021 upon evaluation today patient appears to be doing well with regard to her wound. We are definitely seeing signs of improvement with regard to size. With that being said I do not see any evidence of active infection at this time which is great news and very pleased in that regard. She is going require some debridement both for callus as well as clearing away some of the slough and biofilm. 07-03-2021 upon evaluation today patient appears to be doing well with regard to his wound on the foot. With that being said he unfortunately did have some of the padding along the heel that got pushed down and subsequently he tells me that it was feeling like it was bunched up under his foot and the heel location and bothering him over the past couple of days. With that being said that when we remove the cast today unbeknownst to [facility] the padding had been pushed down and the cast saw actually nicked his heel this looks like a very light abrasion. Nonetheless he does not have any pain and there is no active bleeding at this time. I actually did take a picture to show him as well and we documented it we will see how this looks obviously next week but I think this will be healed by Wednesday. The wound itself looks to be doing much better there is some callus to remove some slough and biofilm as well. 07-10-2021 upon evaluation today patient appears to be doing well with regard to her wounds. She has been tolerating the dressing changes without complication. Actually I feel like the foot is looking a little better in regard to the great toe. Everything else is doing better as well. I feel like she has been much more careful with that and from the callus standpoint things are greatly improved it also helps that she shows up on a regular basis. With that being said we are going to be seeing her following her MRI that scheduled for next week. 07-24-2021 upon evaluation today patient appears to be doing  well with regard to her wound. She actually is shown signs of improvement there are still some callus not nearly as bad as what it is then I think we need to try to see what we can do to get some of this off today but hopefully will continue to show signs of improvement with the current regimen. She has thought about the hyperbarics she tells me there is just really not can be a way for her to get here regularly for that.  07-31-2021 upon evaluation today patient appears to be doing well with regard to her toe ulcer although its not significantly smaller its not worse either. She still has a lot of callus buildup. With that being said I think we need to do something to try to keep pressure off of the toe. I think a bolster underneath the big toe could be beneficial in this regard. [name] ([id number]) 08-14-2021 upon evaluation today patient's wound actually showed signs of some improvement she still had callus but for 2 weeks this was not bad at all compared to what we normally find. I do think we are on the right track here and the good news is she seems to be making some excellent progress in my opinion. 08-21-2021 upon evaluation today patient appears to be doing well with regard to her toe ulcer. Unfortunately her silicone toe spacer was too tight for her toe by the time she got home and was already causing her pain. For that reason she has not been wearing that she is putting the rolled up gauze up underneath. Obviously I think this is still a good way to go and better than nothing the biggest thing is that she still is walking without shoes at home I discussed this with her previously but again I have that discussion again today I want her to have shoes on at all times she has been going around just in her "sock feet". 10-02-2021 upon evaluation today patient appears to be doing poorly in regard to her toe. She is actually been in sinus June 30 when I last saw her. She has had a lot of  issues with her kidneys and then she also had her little puppy passed away she tells me. Unfortunately I think this has weighed heavily on her. Fortunately there does not appear to be any evidence of active infection at this time which is great news. No fevers, chills, nausea, vomiting, or diarrhea. 10-09-2021 upon evaluation today patient appears to be doing well currently in regard to her wound. She has been tolerating the dressing changes without complication. Fortunately there does not appear to be any signs of active infection locally or systemically at this time which is great news. No fevers, chills, nausea, vomiting, or diarrhea. 10-16-2021 upon evaluation today patient appears to be doing well with regard to her toe ulcer I really feel like this is showing signs of improvement which is great news. Fortunately there does not appear to be any evidence of infection locally or systemically which is great news. No fevers, chills, nausea, vomiting, or diarrhea. 10-23-2021 upon evaluation today patient's wound is showing signs of still having quite a bit of callus around the edge we are making a bolster to go underneath the toe but she still has quite a bit of drainage noted at this point. Fortunately there does not appear to be any signs of infection. 10-30-2021 upon evaluation today patient appears to be doing well currently in regard to her wound. She has been tolerating the dressing changes without complication. Fortunately there does not appear to be any evidence of active infection locally or systemically at this time. No fevers, chills, nausea, vomiting, or diarrhea. 11-06-2021 upon evaluation today patient appears to be doing well currently in regard to her toe although it is about the same as may be slightly smaller. Fortunately I do not see any evidence of infection currently which is good news. 11-13-2021 upon evaluation today patient's wound actually appears to be very  macerated compared to  previous. Fortunately I do not see any signs of active infection locally or systemically at this time which is great news. Nonetheless I do feel like that she has a lot of pressure issue here. We have previously done an x-ray and MRI which showed no signs of osteomyelitis. We have also done arterial studies to ensure blood flow was good pulmonary only this shows triphasic waveforms with an ABI of 1.08 which is excellent. That was just completed and in fact the plan final report is not even back yet. Nonetheless overall I think that none of this is a complicating factor for her I think the biggest issue is friction and pressure. That is what we have to figure out a more appropriately managed unfortunately I do not think she is a good candidate for total contact casting due to her balance. 11-20-2021 upon evaluation today patient appears to be doing well currently in regard to her toe ulcer. She has been tolerating the dressing changes without complication. Fortunately there does not appear to be any evidence of active infection locally or systemically at this time which is great news. I am actually very pleased with how things appear. Objective Constitutional Well-nourished and well-hydrated in no acute distress. Vitals Time Taken: 9:11 AM, Height: 61 in, Weight: 250 lbs, BMI: 47.2, Temperature: 98.0 F, Pulse: 87 bpm, Respiratory Rate: 18 breaths/min, Blood Pressure: 170/77 mmHg. Respiratory normal breathing without difficulty. Psychiatric this patient is able to make decisions and demonstrates good insight into disease process. Alert and Oriented x 3. pleasant and cooperative. General Notes: Patient's wound bed actually showed signs of good granulation and epithelization at this point. Fortunately I see no evidence of active infection locally or systemically which is great news and overall I am extremely pleased with where we stand. Integumentary (Hair, Skin) Wound #1 status is Open. Original  cause of wound was Gradually Appeared. The date acquired was: 02/23/2020. The wound has been in treatment 31 weeks. The wound is located on the Left Toe Great. The wound measures 0.2cm length x 0.4cm width x 0.2cm depth; 0.063cm^2 area and 0.013cm^3 volume. There is Fat Layer (Subcutaneous Tissue) exposed. There is a medium amount of serosanguineous drainage noted. The wound margin is thickened. There is large (67-100%) pink granulation within the wound bed. There is a small (1-33%) amount of necrotic tissue Kommer, Mickelle A. ([id number]) within the wound bed including Adherent Slough. 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 There was a Excisional Skin/Subcutaneous Tissue Debridement with a total area of 1 sq cm performed by Tommie Sams., PA-C. With the following instrument(s): Curette to remove Viable and Non-Viable tissue/material. Material removed includes Callus, Subcutaneous Tissue, and Slough. A time out was conducted at 09:38, prior to the start of the procedure. A Minimum amount of bleeding was controlled with Pressure. The procedure was tolerated well. Post Debridement Measurements: 0.3cm length x 0.5cm width x 0.3cm depth; 0.035cm^3 volume. Character of Wound/Ulcer Post Debridement is stable. Severity of Tissue Post Debridement is: Necrosis of muscle. Post procedure Diagnosis Wound #  1: Same as Pre-Procedure Plan Follow-up Appointments: Return Appointment in 1 week. Nurse Visit as  needed Bathing/ Shower/ Hygiene: Wash wounds with antibacterial soap and water. May shower; gently cleanse wound with antibacterial soap, rinse and pat dry prior to dressing wounds No tub bath. Anesthetic (Use 'Patient Medications' Section for Anesthetic Order Entry): Lidocaine applied to wound bed Edema Control - Lymphedema / Segmental Compressive Device / Other: Elevate, Exercise Daily and Avoid Standing for Long Periods of Time. Elevate legs to the level of the heart and pump ankles as often as possible Elevate leg(s) parallel to the floor when sitting. DO YOUR BEST to sleep in the bed at night. DO NOT sleep in your recliner. Long hours of sitting in a recliner leads to swelling of the legs and/or potential wounds on your backside. Off-Loading: Open toe surgical shoe - left foot WOUND #1: - Toe Great Wound Laterality: Left Cleanser: [employer]ram Ancillary Kit - 15 Day Supply (Generic) Every Other Day/30 Days Discharge Instructions: Use supplies as instructed; Kit contains: (15) Saline Bullets; (15) 3x3 Gauze; 15 pr Gloves Cleanser: Normal Saline Every Other Day/30 Days Discharge Instructions: Wash your hands with soap and water. Remove old dressing, discard into plastic bag and place into trash. Cleanse the wound with Normal Saline prior to applying a clean dressing using gauze sponges, not tissues or cotton balls. Do not scrub or use excessive force. Pat dry using gauze sponges, not tissue or cotton balls. Primary Dressing: SILVERCEL Antimicrobial Alginate Dressing, 1x12 (in/in) Every Other Day/30 Days Secondary Dressing: Coverlet Latex-Free Fabric Adhesive Dressings Every Other Day/30 Days Discharge Instructions: 1.5 x 2 Add-Ons: Gauze Non-Bordered 4x4 (in/in) Every Other Day/30 Days Discharge Instructions: Cover with dry gauze, rolled up and placed under left great toe Larocque, Ryeleigh A. ([id number]) 1. I am good recommend that we go ahead and have the patient continue to monitor for any  signs of infection. If anything changes she should contact the office and let me know soon as possible. 2. I would recommend that we continue with the silver alginate dressing which I think is doing well at this point. 3. I would also recommend that we continue with the roll gauze underneath the toe for offloading I think this is still going to be important as well. We will see patient back for reevaluation in 1 week here in the clinic. If anything worsens or changes patient will contact our office for additional recommendations. Electronic Signature(s) Signed: 11/20/2021 10:07:44 AM By: Worthy Keeler PA-C Entered By: Worthy Keeler on 11/20/2021 10:07:44 [name] ([id number]) -------------------------------------------------------------------------------- SuperBill Details Patient Name: [name] Date of Service: 11/20/2021 Medical Record Number: [id number] Patient Account Number: [id number] Date of Birth/Sex: 07/09/1940 (81 y.o. F) Treating RN: Carlene Coria Primary Care Provider: Claris Gower Other Clinician: Massie Kluver Referring Provider: Claris Gower Treating Provider/Extender: Skipper Cliche in Treatment: 31 Diagnosis Coding ICD-10 Codes Code Description E11.621 Type 2 diabetes mellitus with foot ulcer L97.522 Non-pressure chronic ulcer of other part of left foot with fat layer exposed L97.822 Non-pressure chronic ulcer of other part of left lower leg with fat layer exposed Z89.411 Acquired absence of right great toe Z89.421 Acquired absence of other right toe(s) I10 Essential (primary) hypertension Facility Procedures CPT4 Code: 22633354 Description: 56256 - DEB SUBQ TISSUE 20 SQ CM/< Modifier: Quantity: 1 CPT4 Code: Description: ICD-10 Diagnosis Description L97.522 Non-pressure chronic ulcer of other part of left foot with fat layer exp Modifier: osed Quantity: Physician Procedures CPT4 Code: 3893734 Description: 28768 -  WC PHYS SUBQ TISS 20 SQ  CM Modifier: Quantity: 1 CPT4 Code: Description: ICD-10 Diagnosis Description L97.522 Non-pressure chronic ulcer of other part of left foot with fat layer exp Modifier: osed Quantity: Electronic Signature(s) Signed: 11/20/2021 10:08:08 AM By: Worthy Keeler PA-C Entered By: Worthy Keeler on 11/20/2021 10:08:07

## 2021-11-27 ENCOUNTER — Encounter: Payer: Medicare Other | Attending: Physician Assistant | Admitting: Physician Assistant

## 2021-11-27 DIAGNOSIS — I509 Heart failure, unspecified: Secondary | ICD-10-CM | POA: Diagnosis not present

## 2021-11-27 DIAGNOSIS — L97822 Non-pressure chronic ulcer of other part of left lower leg with fat layer exposed: Secondary | ICD-10-CM | POA: Insufficient documentation

## 2021-11-27 DIAGNOSIS — L97522 Non-pressure chronic ulcer of other part of left foot with fat layer exposed: Secondary | ICD-10-CM | POA: Diagnosis not present

## 2021-11-27 DIAGNOSIS — I110 Hypertensive heart disease with heart failure: Secondary | ICD-10-CM | POA: Insufficient documentation

## 2021-11-27 DIAGNOSIS — Z89421 Acquired absence of other right toe(s): Secondary | ICD-10-CM | POA: Diagnosis not present

## 2021-11-27 DIAGNOSIS — E11621 Type 2 diabetes mellitus with foot ulcer: Secondary | ICD-10-CM | POA: Insufficient documentation

## 2021-11-27 DIAGNOSIS — E1140 Type 2 diabetes mellitus with diabetic neuropathy, unspecified: Secondary | ICD-10-CM | POA: Diagnosis not present

## 2021-11-27 NOTE — Progress Notes (Signed)
Dexheimer, [name] ([id number]) Visit Report for 11/27/2021 Arrival Information Details Patient Name: [name], [name] Date of Service: 11/27/2021 8:30 AM Medical Record Number: [id number] Patient Account Number: [id number] Date of Birth/Sex: 1940/04/01 (81 y.o. F) Treating RN: Cornell Barman Primary Care Yazleemar Strassner: Claris Gower Other Clinician: Referring Rajveer Handler: Claris Gower Treating Delores Edelstein/Extender: Skipper Cliche in Treatment: 24 Visit Information History Since Last Visit Added or deleted any medications: No Patient Arrived: Cane Has Dressing in Place as Prescribed: Yes Arrival Time: 08:33 Pain Present Now: No Accompanied By: self Transfer Assistance: None Patient Identification Verified: Yes Secondary Verification Process Completed: Yes Patient Requires Transmission-Based Precautions: No Patient Has Alerts: No Electronic Signature(s) Signed: 11/27/2021 3:11:48 PM By: Gretta Cool, BSN, RN, CWS, [name] RN, BSN Entered By: Gretta Cool, BSN, RN, CWS, [name] on 11/27/2021 08:33:14 Both, [name] ([id number]) -------------------------------------------------------------------------------- Encounter Discharge Information Details Patient Name: [name] Date of Service: 11/27/2021 8:30 AM Medical Record Number: [id number] Patient Account Number: [id number] Date of Birth/Sex: [birth year] (81 y.o. F) Treating RN: Cornell Barman Primary Care Scarlette Hogston: Claris Gower Other Clinician: Referring Gianluca Chhim: Claris Gower Treating Yasenia Reedy/Extender: Skipper Cliche in Treatment: 32 Encounter Discharge Information Items Post Procedure Vitals Discharge Condition: Stable Unable to obtain vitals Reason: limited time Ambulatory Status: Cane Discharge Destination: Home Transportation: Private Auto Accompanied By: self Schedule Follow-up Appointment: Yes Clinical Summary of Care: Electronic Signature(s) Signed: 11/27/2021 3:11:48 PM By: Gretta Cool, BSN, RN, CWS, [name] RN, BSN Entered By: Gretta Cool, BSN, RN, CWS,  [name] on 11/27/2021 10:39:22 [place]el, [name] ([id number]) -------------------------------------------------------------------------------- Lower Extremity Assessment Details Patient Name: [name] Date of Service: 11/27/2021 8:30 AM Medical Record Number: [id number] Patient Account Number: [id number] Date of Birth/Sex: 30-Jun-1940 (81 y.o. F) Treating RN: Cornell Barman Primary Care Brailey Buescher: Claris Gower Other Clinician: Referring Plummer Matich: Claris Gower Treating Jameriah Trotti/Extender: Skipper Cliche in Treatment: 32 Edema Assessment Assessed: [Left: No] Patrice Paradise: No] [Left: Edema] [Right: :] Calf Left: Right: Point of Measurement: 30 cm From Medial Instep 40.6 cm Ankle Left: Right: Point of Measurement: 10 cm From Medial Instep 27 cm Vascular Assessment Pulses: Dorsalis Pedis Palpable: [Left:Yes] Notes +1 pitting edema Electronic Signature(s) Signed: 11/27/2021 3:11:48 PM By: Gretta Cool, BSN, RN, CWS, [name] RN, BSN Entered By: Gretta Cool, BSN, RN, CWS, [name] on 11/27/2021 08:42:49 [place]ster, [name] ([id number]) -------------------------------------------------------------------------------- Multi Wound Chart Details Patient Name: [name] Date of Service: 11/27/2021 8:30 AM Medical Record Number: [id number] Patient Account Number: [id number] Date of Birth/Sex: Jan 13, 1941 (81 y.o. F) Treating RN: Cornell Barman Primary Care Robertha Staples: Claris Gower Other Clinician: Referring Kaitlyn Skowron: Claris Gower Treating Atsushi Yom/Extender: Skipper Cliche in Treatment: 32 Vital Signs Height(in): 61 Pulse(bpm): 91 Weight(lbs): 250 Blood Pressure(mmHg): 145/61 Body Mass Index(BMI): 47.2 Temperature(F): 97.8 Respiratory Rate(breaths/min): 18 Photos: [N/A:N/A] Wound Location: Left Toe Great N/A N/A Wounding Event: Gradually Appeared N/A N/A Primary Etiology: Diabetic Wound/Ulcer of the Lower N/A N/A Extremity Comorbid History: Chronic sinus problems/congestion, N/A N/A Anemia, Sleep Apnea,  Congestive Heart Failure, Hypertension, Type II Diabetes, Neuropathy Date Acquired: 02/23/2020 N/A N/A Weeks of Treatment: 32 N/A N/A Wound Status: Open N/A N/A Wound Recurrence: No N/A N/A Measurements L x W x D (cm) 0.2x0.4x0.2 N/A N/A Area (cm) : 0.063 N/A N/A Volume (cm) : 0.013 N/A N/A % Reduction in Area: 98.70% N/A N/A % Reduction in Volume: 97.20% N/A N/A Starting Position 1 (o'clock): 9 Ending Position 1 (o'clock): 7 Maximum Distance 1 (cm): 0.3 Undermining: Yes N/A N/A Classification: Grade 1 N/A N/A Exudate Amount: Medium N/A N/A Exudate Type: Serosanguineous N/A N/A  Exudate Color: red, brown N/A N/A Wound Margin: Thickened N/A N/A Granulation Amount: Large (67-100%) N/A N/A Granulation Quality: Pink N/A N/A Necrotic Amount: Small (1-33%) N/A N/A Exposed Structures: Fat Layer (Subcutaneous Tissue): N/A N/A Yes Epithelialization: Small (1-33%) N/A N/A Treatment Notes Electronic Signature(s) Signed: 11/27/2021 3:11:48 PM By: Gretta Cool, BSN, RN, CWS, [name] RN, BSN Entered By: Gretta Cool, BSN, RN, CWS, [name] on 11/27/2021 08:53:29 [place]uerstenberg, [name] ([id number]) Cotterill, [name] ([id number]) -------------------------------------------------------------------------------- Multi-Disciplinary Care Plan Details Patient Name: [name] Date of Service: 11/27/2021 8:30 AM Medical Record Number: [id number] Patient Account Number: [id number] Date of Birth/Sex: [birth year] (81 y.o. F) Treating RN: Cornell Barman Primary Care Hayes Rehfeldt: Claris Gower Other Clinician: Referring Ahniya Mitchum: Claris Gower Treating Marajade Lei/Extender: Skipper Cliche in Treatment: 32 Active Inactive Necrotic Tissue Nursing Diagnoses: Impaired tissue integrity related to necrotic/devitalized tissue Knowledge deficit related to management of necrotic/devitalized tissue Goals: Necrotic/devitalized tissue will be minimized in the wound bed Date Initiated: 11/06/2021 Target Resolution Date: 11/06/2021 Goal  Status: Active Patient/caregiver will verbalize understanding of reason and process for debridement of necrotic tissue Date Initiated: 11/06/2021 Target Resolution Date: 11/06/2021 Goal Status: Active Interventions: Assess patient pain level pre-, during and post procedure and prior to discharge Provide education on necrotic tissue and debridement process Treatment Activities: Apply topical anesthetic as ordered : 11/06/2021 Excisional debridement : 11/06/2021 Notes: Pressure Nursing Diagnoses: Knowledge deficit related to management of pressures ulcers Potential for impaired tissue integrity related to pressure, friction, moisture, and shear Goals: Patient/caregiver will verbalize risk factors for pressure ulcer development Date Initiated: 11/27/2021 Target Resolution Date: 11/27/2021 Goal Status: Active Patient/caregiver will verbalize understanding of pressure ulcer management Date Initiated: 11/27/2021 Target Resolution Date: 11/28/2021 Goal Status: Active Interventions: Assess potential for pressure ulcer upon admission and as needed Provide education on pressure ulcers Notes: Wound/Skin Impairment Nursing Diagnoses: Impaired tissue integrity Knowledge deficit related to ulceration/compromised skin integrity Goals: Ulcer/skin breakdown will have a volume reduction of 30% by week 4 Date Initiated: 04/17/2021 Date Inactivated: 07/03/2021 Target Resolution Date: 05/15/2021 Goal Status: Unmet Unmet Reason: comorbities Bollard, FRIMY UFFELMAN ([id number]) Ulcer/skin breakdown will have a volume reduction of 50% by week 8 Date Initiated: 04/17/2021 Date Inactivated: 07/03/2021 Target Resolution Date: 06/12/2021 Goal Status: Met Ulcer/skin breakdown will have a volume reduction of 80% by week 12 Date Initiated: 04/17/2021 Target Resolution Date: 07/10/2021 Goal Status: Active Ulcer/skin breakdown will heal within 14 weeks Date Initiated: 04/17/2021 Target Resolution Date: 07/24/2021 Goal  Status: Active Interventions: Assess patient/caregiver ability to obtain necessary supplies Assess patient/caregiver ability to perform ulcer/skin care regimen upon admission and as needed Assess ulceration(s) every visit Provide education on ulcer and skin care Notes: Electronic Signature(s) Signed: 11/27/2021 3:11:48 PM By: Gretta Cool, BSN, RN, CWS, [name] RN, BSN Entered By: Gretta Cool, BSN, RN, CWS, [name] on 11/27/2021 08:53:20 Bardsley, [name] ([id number]) -------------------------------------------------------------------------------- Pain Assessment Details Patient Name: [name] Date of Service: 11/27/2021 8:30 AM Medical Record Number: [id number] Patient Account Number: [id number] Date of Birth/Sex: 1940-04-05 (81 y.o. F) Treating RN: Cornell Barman Primary Care Michaeljoseph Revolorio: Claris Gower Other Clinician: Referring Tiffane Sheldon: Claris Gower Treating Caela Huot/Extender: Skipper Cliche in Treatment: 32 Active Problems Location of Pain Severity and Description of Pain Patient Has Paino No Site Locations Pain Management and Medication Current Pain Management: Notes Patient denies pain at this time. Electronic Signature(s) Signed: 11/27/2021 3:11:48 PM By: Gretta Cool, BSN, RN, CWS, [name] RN, BSN Entered By: Gretta Cool, BSN, RN, CWS, [name] on 11/27/2021 08:34:00 Zentz, [name] ([id number]) -------------------------------------------------------------------------------- Patient/Caregiver Education Details Patient Name: Aguila,  Syble A. Date of Service: 11/27/2021 8:30 AM Medical Record Number: [id number] Patient Account Number: [id number] Date of Birth/Gender: 1940-05-27 (81 y.o. F) Treating RN: Cornell Barman Primary Care Physician: Claris Gower Other Clinician: Referring Physician: Claris Gower Treating Physician/Extender: Skipper Cliche in Treatment: 28 Education Assessment Education Provided To: Patient Education Topics Provided Wound Debridement: Handouts: Wound Debridement Methods:  Demonstration, Explain/Verbal Responses: State content correctly Wound/Skin Impairment: Handouts: Caring for Your Ulcer Methods: Demonstration, Explain/Verbal Responses: State content correctly Electronic Signature(s) Signed: 11/27/2021 3:11:48 PM By: Gretta Cool, BSN, RN, CWS, [name] RN, BSN Entered By: Gretta Cool, BSN, RN, CWS, [name] on 11/27/2021 10:38:33 Counihan, [name] ([id number]) -------------------------------------------------------------------------------- Wound Assessment Details Patient Name: [name] Date of Service: 11/27/2021 8:30 AM Medical Record Number: [id number] Patient Account Number: [id number] Date of Birth/Sex: 07-26-1940 (81 y.o. F) Treating RN: Cornell Barman Primary Care Kamora Vossler: Claris Gower Other Clinician: Referring Tashyra Adduci: Claris Gower Treating Glennon Kopko/Extender: Skipper Cliche in Treatment: 32 Wound Status Wound Number: 1 Primary Diabetic Wound/Ulcer of the Lower Extremity Etiology: Wound Location: Left Toe Great Wound Open Wounding Event: Gradually Appeared Status: Date Acquired: 02/23/2020 Comorbid Chronic sinus problems/congestion, Anemia, Sleep Apnea, Weeks Of Treatment: 32 History: Congestive Heart Failure, Hypertension, Type II Diabetes, Clustered Wound: No Neuropathy Photos Wound Measurements Length: (cm) 0.2 Width: (cm) 0.4 Depth: (cm) 0.2 Area: (cm) 0.063 Volume: (cm) 0.013 % Reduction in Area: 98.7% % Reduction in Volume: 97.2% Epithelialization: Small (1-33%) Tunneling: No Undermining: Yes Starting Position (o'clock): 9 Ending Position (o'clock): 7 Maximum Distance: (cm) 0.3 Wound Description Classification: Grade 1 Wound Margin: Thickened Exudate Amount: Medium Exudate Type: Serosanguineous Exudate Color: red, brown Foul Odor After Cleansing: No Slough/Fibrino Yes Wound Bed Granulation Amount: Large (67-100%) Exposed Structure Granulation Quality: Pink Fat Layer (Subcutaneous Tissue) Exposed: Yes Necrotic Amount: Small  (1-33%) Necrotic Quality: Adherent Therapist, music) Signed: 11/27/2021 3:11:48 PM By: Gretta Cool, BSN, RN, CWS, [name] RN, BSN Entered By: Gretta Cool, BSN, RN, CWS, [name] on 11/27/2021 08:41:10 [place], [name] ([id number]) -------------------------------------------------------------------------------- Vitals Details Patient Name: [name] Date of Service: 11/27/2021 8:30 AM Medical Record Number: [id number] Patient Account Number: [id number] Date of Birth/Sex: Apr 02, 1940 (81 y.o. F) Treating RN: Cornell Barman Primary Care Malayzia Laforte: Claris Gower Other Clinician: Referring Jovani Flury: Claris Gower Treating Makeda Peeks/Extender: Skipper Cliche in Treatment: 32 Vital Signs Time Taken: 08:33 Temperature (F): 97.8 Height (in): 61 Pulse (bpm): 91 Weight (lbs): 250 Respiratory Rate (breaths/min): 18 Body Mass Index (BMI): 47.2 Blood Pressure (mmHg): 145/61 Reference Range: 80 - 120 mg / dl Electronic Signature(s) Signed: 11/27/2021 3:11:48 PM By: Gretta Cool, BSN, RN, CWS, [name] RN, BSN Entered By: Gretta Cool, BSN, RN, CWS, [name] on 11/27/2021 08:33:32

## 2021-11-27 NOTE — Progress Notes (Signed)
[name], [name] ([id number]) Visit Report for 11/27/2021 Chief Complaint Document Details Patient Name: [name], [name] Date of Service: 11/27/2021 8:30 AM Medical Record Number: [id number] Patient Account Number: [id number] Date of Birth/Sex: 04-19-1940 (81 y.o. F) Treating RN: Cornell Barman Primary Care Provider: Claris Gower Other Clinician: Referring Provider: Claris Gower Treating Provider/Extender: Skipper Cliche in Treatment: 39 Information Obtained from: Patient Chief Complaint Multiple toe ulcersations Electronic Signature(s) Signed: 11/27/2021 9:30:08 AM By: Worthy Keeler PA-C Entered By: Worthy Keeler on 11/27/2021 09:30:08 [name], [name] ([id number]) -------------------------------------------------------------------------------- Debridement Details Patient Name: [name] Date of Service: 11/27/2021 8:30 AM Medical Record Number: [id number] Patient Account Number: [id number] Date of Birth/Sex: August 24, 1940 (81 y.o. F) Treating RN: Cornell Barman Primary Care Provider: Claris Gower Other Clinician: Referring Provider: Claris Gower Treating Provider/Extender: Skipper Cliche in Treatment: 32 Debridement Performed for Wound #1 Left Toe Great Assessment: Performed By: Physician Tommie Sams., PA-C Debridement Type: Debridement Severity of Tissue Pre Debridement: Fat layer exposed Level of Consciousness (Pre- Awake and Alert procedure): Pre-procedure Verification/Time Out Yes - 08:53 Taken: Total Area Debrided (L x W): 0.2 (cm) x 0.4 (cm) = 0.08 (cm) Tissue and other material Viable, Non-Viable, Callus, Slough, Subcutaneous, Slough debrided: Level: Skin/Subcutaneous Tissue Debridement Description: Excisional Instrument: Curette Bleeding: Minimum Hemostasis Achieved: Pressure Response to Treatment: Procedure was tolerated well Level of Consciousness (Post- Awake and Alert procedure): Post Debridement Measurements of Total Wound Length: (cm)  0.3 Width: (cm) 0.5 Depth: (cm) 0.4 Volume: (cm) 0.047 Character of Wound/Ulcer Post Debridement: Stable Severity of Tissue Post Debridement: Fat layer exposed Post Procedure Diagnosis Same as Pre-procedure Electronic Signature(s) Signed: 11/27/2021 1:53:30 PM By: Worthy Keeler PA-C Signed: 11/27/2021 3:11:48 PM By: Gretta Cool, BSN, RN, CWS, [name] RN, BSN Entered By: Gretta Cool, BSN, RN, CWS, [name] on 11/27/2021 08:54:39 [name], [name] ([id number]) -------------------------------------------------------------------------------- HPI Details Patient Name: [name] Date of Service: 11/27/2021 8:30 AM Medical Record Number: [id number] Patient Account Number: [id number] Date of Birth/Sex: 1941-01-11 (81 y.o. F) Treating RN: Cornell Barman Primary Care Provider: Claris Gower Other Clinician: Referring Provider: Claris Gower Treating Provider/Extender: Skipper Cliche in Treatment: 32 History of Present Illness HPI Description: 04/17/2021 upon evaluation patient appears to be doing somewhat poorly currently in regard to her first and second toe of the left foot. She previously has been seen by Dr. Doran Durand she saw him on 31 January he did not feel there was any evidence of osteomyelitis. He did give her a thorough evaluation including x-rays and showed no abnormal findings according to notes. With that being said he felt like that wound care would be beneficial therefore she contacted [place]. She has currently been using antibiotic ointment and has noted that this wound on the great toe has been there for about a year. She is not certain whether there is anything on the second toe or not she has not really noted any drainage but at the same time she cannot be sure there is nothing hiding up underneath a significant callus here. Her feet really do not have any major complications with regard to pain and that is good news. She does have neuropathy. Patient does have a history of diabetes mellitus type 2,  hypertension, and her most recent hemoglobin A1c was 7.6 on June 2022. 05/08/2021 upon evaluation today patient appears to be doing poorly in regard to her toes unfortunately she has a lot of callus buildup. Its been almost a month since I last saw her. Obviously in this  amount of time she has built up quite a bit of callus she was sick that is the reason she did not make it in. Nonetheless I do think that weekly visits is probably can be the best thing is asking to help to allow these areas to heal much more effectively and quickly. Fortunately I do not see any evidence of active infection locally or systemically which is great news. 05/15/2021 upon evaluation today patient appears to be doing well currently in regard to her wound. Overall I think that we are definitely headed in the right direction with regard to the toes. Fortunately I do not see any signs of active infection locally or systemically at this time which is great news. No fevers, chills, nausea, vomiting, or diarrhea. 05/22/2021 upon evaluation today patient appears to be doing well with regard to her wound. She has been tolerating the dressing changes without complication. Fortunately the second toe is healed the first toe on the left foot is still open but does not appear to be doing nearly as poorly at this time. I do think we can perform a little bit of debridement in order to clear away some of the necrotic debris patient is in agreement with that plan. 05-29-2021 upon evaluation today patient appears to be doing worse currently in regard to the pain experience. Her leg is still very red in fact I think is a little bit worse than last week unfortunately. Last week I was even concerned about a little bit of cellulitis she really felt like it was more related to her swelling but again I am not certain that that is necessarily the case. Fortunately I do not see any signs of active infection at this time systemically though locally I am  definitely concerned in this regard. 06-05-2021 upon evaluation today patient appears to be doing well with regard to her toe ulcer this is worse is also not better though. Unfortunately it is hurting more in her leg in general is also bothering her. This is despite being. She is also tolerating Levaquin with making her dizzy. Nonetheless I am not certain its helping after she has been on this week I am not seeing much of the improvement with regard to the cellulitis in her leg. She is also previously been on doxycycline although that was towards the end of February. 06-12-2021 upon evaluation today patient appears to be doing well with regard to her wound. We are definitely seeing signs of improvement with regard to size. With that being said I do not see any evidence of active infection at this time which is great news and very pleased in that regard. She is going require some debridement both for callus as well as clearing away some of the slough and biofilm. 07-03-2021 upon evaluation today patient appears to be doing well with regard to his wound on the foot. With that being said he unfortunately did have some of the padding along the heel that got pushed down and subsequently he tells me that it was feeling like it was bunched up under his foot and the heel location and bothering him over the past couple of days. With that being said that when we remove the cast today unbeknownst to [facility] the padding had been pushed down and the cast saw actually nicked his heel this looks like a very light abrasion. Nonetheless he does not have any pain and there is no active bleeding at this time. I actually did take a picture to  show him as well and we documented it we will see how this looks obviously next week but I think this will be healed by Wednesday. The wound itself looks to be doing much better there is some callus to remove some slough and biofilm as well. 07-10-2021 upon evaluation today patient  appears to be doing well with regard to her wounds. She has been tolerating the dressing changes without complication. Actually I feel like the foot is looking a little better in regard to the great toe. Everything else is doing better as well. I feel like she has been much more careful with that and from the callus standpoint things are greatly improved it also helps that she shows up on a regular basis. With that being said we are going to be seeing her following her MRI that scheduled for next week. 07-24-2021 upon evaluation today patient appears to be doing well with regard to her wound. She actually is shown signs of improvement there are still some callus not nearly as bad as what it is then I think we need to try to see what we can do to get some of this off today but hopefully will continue to show signs of improvement with the current regimen. She has thought about the hyperbarics she tells me there is just really not can be a way for her to get here regularly for that. 07-31-2021 upon evaluation today patient appears to be doing well with regard to her toe ulcer although its not significantly smaller its not worse either. She still has a lot of callus buildup. With that being said I think we need to do something to try to keep pressure off of the toe. I think a bolster underneath the big toe could be beneficial in this regard. 08-14-2021 upon evaluation today patient's wound actually showed signs of some improvement she still had callus but for 2 weeks this was not bad at all compared to what we normally find. I do think we are on the right track here and the good news is she seems to be making some excellent progress in my opinion. 08-21-2021 upon evaluation today patient appears to be doing well with regard to her toe ulcer. Unfortunately her silicone toe spacer was too tight Bert, Seaira A. ([id number]) for her toe by the time she got home and was already causing her pain. For that reason she  has not been wearing that she is putting the rolled up gauze up underneath. Obviously I think this is still a good way to go and better than nothing the biggest thing is that she still is walking without shoes at home I discussed this with her previously but again I have that discussion again today I want her to have shoes on at all times she has been going around just in her "sock feet". 10-02-2021 upon evaluation today patient appears to be doing poorly in regard to her toe. She is actually been in sinus June 30 when I last saw her. She has had a lot of issues with her kidneys and then she also had her little puppy passed away she tells me. Unfortunately I think this has weighed heavily on her. Fortunately there does not appear to be any evidence of active infection at this time which is great news. No fevers, chills, nausea, vomiting, or diarrhea. 10-09-2021 upon evaluation today patient appears to be doing well currently in regard to her wound. She has been tolerating the dressing changes without complication.  Fortunately there does not appear to be any signs of active infection locally or systemically at this time which is great news. No fevers, chills, nausea, vomiting, or diarrhea. 10-16-2021 upon evaluation today patient appears to be doing well with regard to her toe ulcer I really feel like this is showing signs of improvement which is great news. Fortunately there does not appear to be any evidence of infection locally or systemically which is great news. No fevers, chills, nausea, vomiting, or diarrhea. 10-23-2021 upon evaluation today patient's wound is showing signs of still having quite a bit of callus around the edge we are making a bolster to go underneath the toe but she still has quite a bit of drainage noted at this point. Fortunately there does not appear to be any signs of infection. 10-30-2021 upon evaluation today patient appears to be doing well currently in regard to her wound. She  has been tolerating the dressing changes without complication. Fortunately there does not appear to be any evidence of active infection locally or systemically at this time. No fevers, chills, nausea, vomiting, or diarrhea. 11-06-2021 upon evaluation today patient appears to be doing well currently in regard to her toe although it is about the same as may be slightly smaller. Fortunately I do not see any evidence of infection currently which is good news. 11-13-2021 upon evaluation today patient's wound actually appears to be very macerated compared to previous. Fortunately I do not see any signs of active infection locally or systemically at this time which is great news. Nonetheless I do feel like that she has a lot of pressure issue here. We have previously done an x-ray and MRI which showed no signs of osteomyelitis. We have also done arterial studies to ensure blood flow was good pulmonary only this shows triphasic waveforms with an ABI of 1.08 which is excellent. That was just completed and in fact the plan final report is not even back yet. Nonetheless overall I think that none of this is a complicating factor for her I think the biggest issue is friction and pressure. That is what we have to figure out a more appropriately managed unfortunately I do not think she is a good candidate for total contact casting due to her balance. 11-20-2021 upon evaluation today patient appears to be doing well currently in regard to her toe ulcer. She has been tolerating the dressing changes without complication. Fortunately there does not appear to be any evidence of active infection locally or systemically at this time which is great news. I am actually very pleased with how things appear. 11-27-2021 upon evaluation today patient's wound actually is showing signs of still having some callus buildup obviously there is still rubbing occurring. With that being said I do believe that the patient would benefit from  the use of some type of more aggressive offloading but unfortunately her balance just precludes this we have even discussed casting. With that being said I do think that she is doing a little better though I think if we can get more the pressure and friction from occurring on the end of this toe she would be even better. Electronic Signature(s) Signed: 11/27/2021 1:48:25 PM By: Worthy Keeler PA-C Entered By: Worthy Keeler on 11/27/2021 13:48:25 [name], [name] ([id number]) -------------------------------------------------------------------------------- Physical Exam Details Patient Name: [name] Date of Service: 11/27/2021 8:30 AM Medical Record Number: [id number] Patient Account Number: [id number] Date of Birth/Sex: Dec 24, 1940 (81 y.o. F) Treating RN: Cornell Barman Primary Care  Provider: Claris Gower Other Clinician: Referring Provider: Claris Gower Treating Provider/Extender: Skipper Cliche in Treatment: 69 Constitutional Well-nourished and well-hydrated in no acute distress. Respiratory normal breathing without difficulty. Psychiatric this patient is able to make decisions and demonstrates good insight into disease process. Alert and Oriented x 3. pleasant and cooperative. Notes Upon inspection patient's wound bed actually showed signs of good granulation and epithelization at this point. Fortunately I do not see any signs of infection which is great news and overall I am extremely pleased with where we stand currently. Electronic Signature(s) Signed: 11/27/2021 1:48:40 PM By: Worthy Keeler PA-C Entered By: Worthy Keeler on 11/27/2021 13:48:39 [name], [name] ([id number]) -------------------------------------------------------------------------------- Physician Orders Details Patient Name: [name] Date of Service: 11/27/2021 8:30 AM Medical Record Number: [id number] Patient Account Number: [id number] Date of Birth/Sex: 05/20/1940 (81 y.o. F) Treating RN:  Cornell Barman Primary Care Provider: Claris Gower Other Clinician: Referring Provider: Claris Gower Treating Provider/Extender: Skipper Cliche in Treatment: 35 Verbal / Phone Orders: No Diagnosis Coding Follow-up Appointments o Return Appointment in 1 week. o Nurse Visit as needed [employer] o Wash wounds with antibacterial soap and water. o May shower; gently cleanse wound with antibacterial soap, rinse and pat dry prior to dressing wounds o No tub bath. Anesthetic (Use 'Patient Medications' Section for Anesthetic Order Entry) o Lidocaine applied to wound bed Edema Control - Lymphedema / Segmental Compressive Device / Other o Elevate, Exercise Daily and Avoid Standing for Long Periods of Time. o Elevate legs to the level of the heart and pump ankles as often as possible o Elevate leg(s) parallel to the floor when sitting. o DO YOUR BEST to sleep in the bed at night. DO NOT sleep in your recliner. Long hours of sitting in a recliner leads to swelling of the legs and/or potential wounds on your backside. Off-Loading o Open toe surgical shoe - left foot o Other: - Rolled 2 gauze up and taped for bolster under great toe. Cut into 1/3s. Gave additional 2 to patient to have for dressing changes. Wound Treatment Wound #1 - Toe Great Wound Laterality: Left Cleanser: [place] Ancillary Kit - 15 Day Supply (Generic) Every Other Day/30 Days Discharge Instructions: Use supplies as instructed; Kit contains: (15) Saline Bullets; (15) 3x3 Gauze; 15 pr Gloves Cleanser: Normal Saline Every Other Day/30 Days Discharge Instructions: Wash your hands with soap and water. Remove old dressing, discard into plastic bag and place into trash. Cleanse the wound with Normal Saline prior to applying a clean dressing using gauze sponges, not tissues or cotton balls. Do not scrub or use excessive force. Pat dry using gauze sponges, not tissue or cotton balls. Primary  Dressing: SILVERCEL Antimicrobial Alginate Dressing, 1x12 (in/in) Every Other Day/30 Days Secondary Dressing: Coverlet Latex-Free Fabric Adhesive Dressings Every Other Day/30 Days Discharge Instructions: 1.5 x 2 Add-Ons: Gauze Non-Bordered 4x4 (in/in) Every Other Day/30 Days Discharge Instructions: Cover with dry gauze, rolled up and placed under left great toe Electronic Signature(s) Signed: 11/27/2021 1:53:30 PM By: Worthy Keeler PA-C Signed: 11/27/2021 3:11:48 PM By: Gretta Cool, BSN, RN, CWS, [name] RN, BSN Entered By: Gretta Cool, BSN, RN, CWS, [name] on 11/27/2021 09:13:43 [name], [name] ([id number]) -------------------------------------------------------------------------------- Problem List Details Patient Name: [name] Date of Service: 11/27/2021 8:30 AM Medical Record Number: [id number] Patient Account Number: [id number] Date of Birth/Sex: 09-05-1940 (81 y.o. 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 04/17/2021 No Yes Z89.421 Acquired absence of other right toe(s) 04/17/2021 No Yes I10 Essential (primary) hypertension 04/17/2021 No Yes Inactive Problems Resolved Problems Electronic Signature(s) Signed: 11/27/2021 9:30:05 AM By: Worthy Keeler PA-C Entered By: Worthy Keeler on 11/27/2021 09:30:05 [name], [name] ([id number]) -------------------------------------------------------------------------------- Progress Note Details Patient Name: [name] Date of Service: 11/27/2021 8:30 AM Medical Record Number: [id number] Patient Account Number: [id number] Date of Birth/Sex: 08-18-1940 (81 y.o. F) Treating RN: Cornell Barman Primary Care Provider: Claris Gower Other Clinician: Referring Provider: Claris Gower Treating Provider/Extender: Skipper Cliche in Treatment: 32 Subjective Chief Complaint Information obtained from Patient Multiple toe ulcersations History of Present Illness (HPI) 04/17/2021 upon evaluation patient appears to be doing somewhat poorly currently in regard to her first and second toe of the left foot. She previously has been seen by Dr. Doran Durand she saw him on 31 January he did not feel there was any evidence of osteomyelitis. He did give her a thorough evaluation including x-rays and showed no abnormal findings according to notes. With that being said he felt like that wound care would be beneficial therefore she contacted [place]. She has currently been using antibiotic ointment and has noted that this wound on the great toe has been there for about a year. She is not certain whether there is anything on the second toe or not she has not really noted any drainage but at the same time she cannot be sure there is nothing hiding up underneath a significant callus here. Her feet really do not have any major complications with regard to pain and that is good news. She does have neuropathy. Patient does have a history of diabetes mellitus type 2, hypertension, and her most recent hemoglobin A1c was 7.6 on June 2022. 05/08/2021 upon evaluation today patient appears to be doing poorly in regard to her toes unfortunately she has a lot of callus buildup. Its been almost a month since I last saw her. Obviously in this amount of time she has built up quite a bit of callus she was sick that is the reason she did not make it in. Nonetheless I do think that weekly visits is probably can be the best thing is asking to help to allow these  areas to heal much more effectively and quickly. Fortunately I do not see any evidence of active infection locally or systemically which is great news. 05/15/2021 upon evaluation today patient appears to be doing well currently in regard to her wound. Overall I think that we are definitely headed in the right direction with regard to the toes. Fortunately I do not see any signs of active infection locally or systemically at this time which is great news. No fevers, chills, nausea, vomiting, or diarrhea. 05/22/2021 upon evaluation today patient appears to be doing well with regard to her wound. She has been tolerating the dressing changes without complication. Fortunately the second toe is healed the first toe on the left foot is still open but does not appear to be doing nearly as poorly at this time. I do think we can perform a little  bit of debridement in order to clear away some of the necrotic debris patient is in agreement with that plan. 05-29-2021 upon evaluation today patient appears to be doing worse currently in regard to the pain experience. Her leg is still very red in fact I think is a little bit worse than last week unfortunately. Last week I was even concerned about a little bit of cellulitis she really felt like it was more related to her swelling but again I am not certain that that is necessarily the case. Fortunately I do not see any signs of active infection at this time systemically though locally I am definitely concerned in this regard. 06-05-2021 upon evaluation today patient appears to be doing well with regard to her toe ulcer this is worse is also not better though. Unfortunately it is hurting more in her leg in general is also bothering her. This is despite being. She is also tolerating Levaquin with making her dizzy. Nonetheless I am not certain its helping after she has been on this week I am not seeing much of the improvement with regard to the cellulitis in her leg. She is  also previously been on doxycycline although that was towards the end of February. 06-12-2021 upon evaluation today patient appears to be doing well with regard to her wound. We are definitely seeing signs of improvement with regard to size. With that being said I do not see any evidence of active infection at this time which is great news and very pleased in that regard. She is going require some debridement both for callus as well as clearing away some of the slough and biofilm. 07-03-2021 upon evaluation today patient appears to be doing well with regard to his wound on the foot. With that being said he unfortunately did have some of the padding along the heel that got pushed down and subsequently he tells me that it was feeling like it was bunched up under his foot and the heel location and bothering him over the past couple of days. With that being said that when we remove the cast today unbeknownst to [facility] the padding had been pushed down and the cast saw actually nicked his heel this looks like a very light abrasion. Nonetheless he does not have any pain and there is no active bleeding at this time. I actually did take a picture to show him as well and we documented it we will see how this looks obviously next week but I think this will be healed by Wednesday. The wound itself looks to be doing much better there is some callus to remove some slough and biofilm as well. 07-10-2021 upon evaluation today patient appears to be doing well with regard to her wounds. She has been tolerating the dressing changes without complication. Actually I feel like the foot is looking a little better in regard to the great toe. Everything else is doing better as well. I feel like she has been much more careful with that and from the callus standpoint things are greatly improved it also helps that she shows up on a regular basis. With that being said we are going to be seeing her following her MRI that scheduled  for next week. 07-24-2021 upon evaluation today patient appears to be doing well with regard to her wound. She actually is shown signs of improvement there are still some callus not nearly as bad as what it is then I think we need to try to see what we  can do to get some of this off today but hopefully will continue to show signs of improvement with the current regimen. She has thought about the hyperbarics she tells me there is just really not can be a way for her to get here regularly for that. 07-31-2021 upon evaluation today patient appears to be doing well with regard to her toe ulcer although its not significantly smaller its not worse either. She still has a lot of callus buildup. With that being said I think we need to do something to try to keep pressure off of the toe. I think a bolster underneath the big toe could be beneficial in this regard. [name], [name] ([id number]) 08-14-2021 upon evaluation today patient's wound actually showed signs of some improvement she still had callus but for 2 weeks this was not bad at all compared to what we normally find. I do think we are on the right track here and the good news is she seems to be making some excellent progress in my opinion. 08-21-2021 upon evaluation today patient appears to be doing well with regard to her toe ulcer. Unfortunately her silicone toe spacer was too tight for her toe by the time she got home and was already causing her pain. For that reason she has not been wearing that she is putting the rolled up gauze up underneath. Obviously I think this is still a good way to go and better than nothing the biggest thing is that she still is walking without shoes at home I discussed this with her previously but again I have that discussion again today I want her to have shoes on at all times she has been going around just in her "sock feet". 10-02-2021 upon evaluation today patient appears to be doing poorly in regard to her toe. She is  actually been in sinus June 30 when I last saw her. She has had a lot of issues with her kidneys and then she also had her little puppy passed away she tells me. Unfortunately I think this has weighed heavily on her. Fortunately there does not appear to be any evidence of active infection at this time which is great news. No fevers, chills, nausea, vomiting, or diarrhea. 10-09-2021 upon evaluation today patient appears to be doing well currently in regard to her wound. She has been tolerating the dressing changes without complication. Fortunately there does not appear to be any signs of active infection locally or systemically at this time which is great news. No fevers, chills, nausea, vomiting, or diarrhea. 10-16-2021 upon evaluation today patient appears to be doing well with regard to her toe ulcer I really feel like this is showing signs of improvement which is great news. Fortunately there does not appear to be any evidence of infection locally or systemically which is great news. No fevers, chills, nausea, vomiting, or diarrhea. 10-23-2021 upon evaluation today patient's wound is showing signs of still having quite a bit of callus around the edge we are making a bolster to go underneath the toe but she still has quite a bit of drainage noted at this point. Fortunately there does not appear to be any signs of infection. 10-30-2021 upon evaluation today patient appears to be doing well currently in regard to her wound. She has been tolerating the dressing changes without complication. Fortunately there does not appear to be any evidence of active infection locally or systemically at this time. No fevers, chills, nausea, vomiting, or diarrhea. 11-06-2021 upon evaluation today  patient appears to be doing well currently in regard to her toe although it is about the same as may be slightly smaller. Fortunately I do not see any evidence of infection currently which is good news. 11-13-2021 upon evaluation  today patient's wound actually appears to be very macerated compared to previous. Fortunately I do not see any signs of active infection locally or systemically at this time which is great news. Nonetheless I do feel like that she has a lot of pressure issue here. We have previously done an x-ray and MRI which showed no signs of osteomyelitis. We have also done arterial studies to ensure blood flow was good pulmonary only this shows triphasic waveforms with an ABI of 1.08 which is excellent. That was just completed and in fact the plan final report is not even back yet. Nonetheless overall I think that none of this is a complicating factor for her I think the biggest issue is friction and pressure. That is what we have to figure out a more appropriately managed unfortunately I do not think she is a good candidate for total contact casting due to her balance. 11-20-2021 upon evaluation today patient appears to be doing well currently in regard to her toe ulcer. She has been tolerating the dressing changes without complication. Fortunately there does not appear to be any evidence of active infection locally or systemically at this time which is great news. I am actually very pleased with how things appear. 11-27-2021 upon evaluation today patient's wound actually is showing signs of still having some callus buildup obviously there is still rubbing occurring. With that being said I do believe that the patient would benefit from the use of some type of more aggressive offloading but unfortunately her balance just precludes this we have even discussed casting. With that being said I do think that she is doing a little better though I think if we can get more the pressure and friction from occurring on the end of this toe she would be even better. Objective Constitutional Well-nourished and well-hydrated in no acute distress. Vitals Time Taken: 8:33 AM, Height: 61 in, Weight: 250 lbs, BMI: 47.2,  Temperature: 97.8 F, Pulse: 91 bpm, Respiratory Rate: 18 breaths/min, Blood Pressure: 145/61 mmHg. Respiratory normal breathing without difficulty. Psychiatric this patient is able to make decisions and demonstrates good insight into disease process. Alert and Oriented x 3. pleasant and cooperative. General Notes: Upon inspection patient's wound bed actually showed signs of good granulation and epithelization at this point. Fortunately I do not see any signs of infection which is great news and overall I am extremely pleased with where we stand currently. Integumentary (Hair, Skin) [name], Cheryl A. ([id number]) Wound #1 status is Open. Original cause of wound was Gradually Appeared. The date acquired was: 02/23/2020. The wound has been in treatment 32 weeks. The wound is located on the Left Toe Great. The wound measures 0.2cm length x 0.4cm width x 0.2cm depth; 0.063cm^2 area and 0.013cm^3 volume. There is Fat Layer (Subcutaneous Tissue) exposed. There is no tunneling noted, however, there is undermining starting at 9:00 and ending at 7:00 with a maximum distance of 0.3cm. There is a medium amount of serosanguineous drainage noted. The wound margin is thickened. There is large (67-100%) pink granulation within the wound bed. There is a small (1-33%) amount of necrotic tissue within the wound bed including Adherent Slough. 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 There was a Excisional Skin/Subcutaneous  Tissue Debridement with a total area of 0.08 sq cm performed by Tommie Sams., PA-C. With the following instrument(s): Curette to remove Viable and Non-Viable tissue/material. Material removed includes Callus, Subcutaneous Tissue, and Slough. No specimens were taken. A time out was conducted at 08:53, prior to the start of the procedure. A Minimum amount of bleeding was controlled with Pressure. The procedure was tolerated well. Post Debridement Measurements: 0.3cm length x 0.5cm width x 0.4cm depth; 0.047cm^3 volume. Character of Wound/Ulcer Post Debridement is stable. Severity of Tissue Post Debridement is: Fat layer exposed. Post procedure Diagnosis Wound #1: Same as Pre-Procedure Plan Follow-up Appointments: Return Appointment in 1 week. Nurse Visit as needed Bathing/ Shower/ Hygiene: Wash wounds with antibacterial soap and water. May shower; gently cleanse wound with antibacterial soap, rinse and pat dry prior to dressing wounds No tub bath. Anesthetic (Use 'Patient Medications' Section for Anesthetic Order Entry): Lidocaine applied to wound bed Edema Control - Lymphedema / Segmental Compressive Device / Other: Elevate, Exercise Daily and Avoid Standing for Long Periods of Time. Elevate legs to the level of the heart and pump ankles as often as possible Elevate leg(s) parallel to the floor when sitting. DO YOUR BEST to sleep in the bed at night. DO NOT sleep in your recliner. Long hours of sitting in a recliner leads to swelling of the legs and/or potential wounds on your backside. Off-Loading: Open toe surgical shoe - left foot Other: - Rolled 2 gauze up and taped for bolster under great toe. Cut into 1/3s. Gave additional 2 to patient to have for dressing changes. WOUND #1: - Toe Great Wound Laterality: Left Cleanser: Byram Ancillary Kit - 15 Day Supply (Generic) Every Other Day/30 Days Discharge Instructions: Use supplies as instructed; Kit contains: (15) Saline Bullets; (15)  3x3 Gauze; 15 pr Gloves Cleanser: Normal Saline Every Other Day/30 Days Discharge Instructions: Wash your hands with soap and water. Remove old dressing, discard into plastic bag and place into trash. Cleanse the wound with Normal Saline prior to applying a clean dressing using gauze sponges, not tissues or cotton balls. Do not scrub or use excessive force. Pat dry using gauze sponges, not tissue or cotton balls. Primary Dressing: SILVERCEL Antimicrobial Alginate Dressing, 1x12 (in/in) Every Other Day/30 Days Secondary Dressing: Coverlet Latex-Free Fabric Adhesive Dressings Every Other Day/30 Days Discharge Instructions: 1.5 x 2 Tamayo, Cheryl A. ([id number]) Add-Ons: Gauze Non-Bordered 4x4 (in/in) Every Other Day/30 Days Discharge Instructions: Cover with dry gauze, rolled up and placed under left great toe 1. I am going to recommend that we have the patient go ahead and continue to monitor for any signs of worsening or infection. Obviously if anything changes she can contact the office and let me know. 2. I am also can recommend that we have the patient continue with the silver alginate dressing which I think is also doing quite well. I think we have seen some improvement since switching to this. 3. She should still continue with the rolled up gauze up underneath the toe which I think is helpful as far as trying to keep pressure  off as well. We will see patient back for reevaluation in 1 week here in the clinic. If anything worsens or changes patient will contact our office for additional recommendations. Electronic Signature(s) Signed: 11/27/2021 1:49:21 PM By: Worthy Keeler PA-C Entered By: Worthy Keeler on 11/27/2021 13:49:21 [name], [name] ([id number]) -------------------------------------------------------------------------------- SuperBill Details Patient Name: [name] Date of Service: 11/27/2021 Medical Record Number: [id number] Patient Account Number: [id number] Date of  Birth/Sex: 22-Sep-1940 (81 y.o. F) Treating RN: Cornell Barman Primary Care Provider: Claris Gower Other Clinician: Referring Provider: Claris Gower Treating Provider/Extender: Skipper Cliche in Treatment: 32 Diagnosis Coding ICD-10 Codes Code Description E11.621 Type 2 diabetes mellitus with foot ulcer L97.522 Non-pressure chronic ulcer of other part of left foot with fat layer exposed L97.822 Non-pressure chronic ulcer of other part of left lower leg with fat layer exposed Z89.411 Acquired absence of right great toe Z89.421 Acquired absence of other right toe(s) I10 Essential (primary) hypertension Facility Procedures CPT4 Code: 41146431 Description: 42767 - DEB SUBQ TISSUE 20 SQ CM/< Modifier: Quantity: 1 CPT4 Code: Description: ICD-10 Diagnosis Description L97.522 Non-pressure chronic ulcer of other part of left foot with fat layer exp Modifier: osed Quantity: Physician Procedures CPT4 Code: 0110034 Description: 11042 - WC PHYS SUBQ TISS 20 SQ CM Modifier: Quantity: 1 CPT4 Code: Description: ICD-10 Diagnosis Description L97.522 Non-pressure chronic ulcer of other part of left foot with fat layer exp Modifier: osed Quantity: Electronic Signature(s) Signed: 11/27/2021 1:51:20 PM By: Worthy Keeler PA-C Entered By: Worthy Keeler on 11/27/2021 13:51:19

## 2021-12-04 ENCOUNTER — Ambulatory Visit: Payer: Medicare Other | Admitting: Physician Assistant

## 2021-12-11 ENCOUNTER — Ambulatory Visit: Payer: Medicare Other | Admitting: Physician Assistant

## 2021-12-18 ENCOUNTER — Encounter: Payer: Medicare Other | Admitting: Physician Assistant

## 2021-12-18 DIAGNOSIS — E11621 Type 2 diabetes mellitus with foot ulcer: Secondary | ICD-10-CM | POA: Diagnosis not present

## 2021-12-18 NOTE — Progress Notes (Signed)
Alonge, [name] ([id number]) [id number] Page 1 of 2 Visit Report for 12/18/2021 Chief Complaint Document Details Patient Name: Date of Service: [name], [name] 12/18/2021 9:00 A M Medical Record Number: [id number] Patient Account Number: [id number] Date of Birth/Sex: Treating RN: 1940/05/18 (81 y.o. [name] Primary Care Provider: Claris Gower Other Clinician: Massie Kluver Referring Provider: Treating Provider/Extender: Linton Flemings in Treatment: 80 Information Obtained from: Patient Chief Complaint Multiple toe ulcersations Electronic Signature(s) Signed: 12/18/2021 9:26:19 AM By: Worthy Keeler PA-C Entered By: Worthy Keeler on 12/18/2021 09:26:19 -------------------------------------------------------------------------------- Problem List Details Patient Name: Date of Service: Cheryl Bruns, [name] 12/18/2021 9:00 A M Medical Record Number: [id number] Patient Account Number: [id number] Date of Birth/Sex: Treating RN: 1940/09/19 (81 y.o. [name] Primary Care Provider: Claris Gower Other Clinician: Massie Kluver Referring Provider: Treating Provider/Extender: Linton Flemings in Treatment: 35 Active Problems ICD-10 Encounter Code Description Active Date MDM Diagnosis E11.621 Type 2 diabetes mellitus with foot ulcer 04/17/2021 No Yes L97.522 Non-pressure chronic ulcer of other part of left foot with fat 04/17/2021 No Yes layer exposed L97.822 Non-pressure chronic ulcer of other part of left lower leg with 10/16/2021 No Yes fat layer exposed Tagliaferro, [name] ([id number]) [id number] Page 2 of 2 Z89.411 Acquired absence of right great toe 04/17/2021 No Yes Z89.421 Acquired absence of other right toe(s) 04/17/2021 No Yes I10 Essential (primary) hypertension 04/17/2021 No Yes Inactive Problems Resolved Problems Electronic Signature(s) Signed: 12/18/2021 9:26:15 AM By:  Worthy Keeler PA-C Entered By: Worthy Keeler on 12/18/2021 09:26:15

## 2021-12-22 NOTE — Progress Notes (Signed)
[name] ([id number]) [id number] Page 1 of 9 Visit Report for 12/18/2021 Arrival Information Details Patient Name: Date of Service: [name], [name]. 12/18/2021 9:00 [name] Medical Record Number: [id number] Patient Account Number: [id number] Date of Birth/Sex: Treating RN: 11/01/1940 (81 y.o. [name] Primary Care Ryer Asato: Claris Gower Other Clinician: Massie Kluver Referring Lamir Racca: Treating Besnik Febus/Extender: Linton Flemings in Treatment: 61 Visit Information History Since Last Visit All ordered tests and consults were completed: No Patient Arrived: [name] Added or deleted any medications: No Arrival Time: 09:20 Any new allergies or adverse reactions: No Transfer Assistance: None Had [name] fall or experienced change in No Patient Requires Transmission-Based Precautions: No activities of daily living that may affect Patient Has Alerts: No risk of falls: Hospitalized since last visit: No Pain Present Now: No Electronic Signature(s) Signed: 12/21/2021 5:32:10 PM By: Massie Kluver Entered By: Massie Kluver on 12/18/2021 09:23:57 -------------------------------------------------------------------------------- Clinic Level of Care Assessment Details Patient Name: Date of Service: [name]. 12/18/2021 9:00 [name] Medical Record Number: [id number] Patient Account Number: [id number] Date of Birth/Sex: Treating RN: August 26, 1940 (81 y.o. [name] Primary Care Khyrie Masi: Claris Gower Other Clinician: Massie Kluver Referring Laurielle Selmon: Treating Flower Franko/Extender: Linton Flemings in Treatment: 71 Clinic Level of Care Assessment Items TOOL 1 Quantity Score _0  - 0 Use when EandM and Procedure is performed on INITIAL visit ASSESSMENTS - Nursing Assessment / Reassessment _1  - 0 General Physical Exam (combine w/ comprehensive assessment (listed just below) when performed on new pt. evals) _2  -  0 Comprehensive Assessment (HX, ROS, Risk Assessments, Wounds Hx, etc.) ASSESSMENTS - Wound and Skin Assessment / Reassessment _3  - 0 Dermatologic / Skin Assessment (not related to wound area) ASSESSMENTS - Ostomy and/or Continence Assessment and Care [name], [name] ([id number]) [id number] Page 2 of 9 _4  - 0 Incontinence Assessment and Management _5  - 0 Ostomy Care Assessment and Management (repouching, etc.) PROCESS - Coordination of Care _6  - 0 Simple Patient / Family Education for ongoing care _7  - 0 Complex (extensive) Patient / Family Education for ongoing care _8  - 0 Staff obtains Programmer, systems, Records, T Results / Process Orders est _9  - 0 Staff telephones HHA, Nursing Homes / Clarify orders / etc _10  - 0 Routine Transfer to another Facility (non-emergent condition) _11  - 0 Routine Hospital Admission (non-emergent condition) _12  - 0 New Admissions / Biomedical engineer / Ordering NPWT Apligraf, etc. , _13  - 0 Emergency Hospital Admission (emergent condition) PROCESS - Special Needs _14  - 0 Pediatric / Minor Patient Management _15  - 0 Isolation Patient Management _16  - 0 Hearing / Language / Visual special needs _17  - 0 Assessment of Community assistance (transportation, D/C planning, etc.) _18  - 0 Additional assistance / Altered mentation _19  - 0 Support Surface(s) Assessment (bed, cushion, seat, etc.) INTERVENTIONS - Miscellaneous _20  - 0 External ear exam _21  - 0 Patient Transfer (multiple staff / Civil Service fast streamer / Similar devices) _22  - 0 Simple Staple / Suture removal (25 or less) _23  - 0 Complex Staple / Suture removal (26 or more) _24  - 0 Hypo/Hyperglycemic Management (do not check if billed separately) _25  - 0 Ankle / Brachial Index (ABI) - do not check if billed separately Has the patient been seen at the hospital within the last three years: Yes Total Score: 0 Level Of Care: ____ Electronic Signature(s) Signed: 12/21/2021 5:32:10 PM  By: Massie Kluver Entered By: Massie Kluver on 12/18/2021 09:55:25 -------------------------------------------------------------------------------- Encounter Discharge Information Details Patient Name:  Date of Service: [name], [name]. 12/18/2021 9:00 [name] Medical Record Number: [id number] Patient Account Number: [id number] Date of Birth/Sex: Treating RN: September 19, 1940 (81 y.o. [name] Primary Care Jayleene Glaeser: Claris Gower Other Clinician: Massie Kluver Referring Deltha Bernales: Treating Emiyah Spraggins/Extender: Linton Flemings in Treatment: [phone] Encounter Discharge Information Items Post Procedure Vitals Discharge Condition: Stable Temperature (F): 98.0 [name] ([id number]) [id number] Page 3 of 9 Ambulatory Status: Cane Pulse (bpm): 86 Discharge Destination: Home Respiratory Rate (breaths/min): 18 Transportation: Private Auto Blood Pressure (mmHg): 154/80 Accompanied By: self Schedule Follow-up Appointment: Yes Clinical Summary of Care: Electronic Signature(s) Signed: 12/21/2021 5:32:10 PM By: Massie Kluver Entered By: Massie Kluver on 12/18/2021 10:04:48 -------------------------------------------------------------------------------- Lower Extremity Assessment Details Patient Name: Date of Service: [name]. 12/18/2021 9:00 [name] Medical Record Number: [id number] Patient Account Number: [id number] Date of Birth/Sex: Treating RN: 1940/09/22 (81 y.o. [name] Primary Care Andray Assefa: Claris Gower Other Clinician: Massie Kluver Referring Ronnisha Felber: Treating Rushie Brazel/Extender: Linton Flemings in Treatment: 35 Edema Assessment Assessed: [name]: Yes] Patrice Paradise: No] Edema: [Left: Ye] [Right: s] Calf Left: Right: Point of Measurement: 30 cm From Medial Instep 39.5 cm Ankle Left: Right: Point of Measurement: 10 cm From Medial Instep 27.2 cm Vascular Assessment Pulses: Dorsalis Pedis Palpable:  [Left:Yes] Electronic Signature(s) Signed: 12/18/2021 11:05:28 AM By: Gretta Cool, BSN, RN, CWS, [name] RN, BSN Signed: 12/21/2021 5:32:10 PM By: Massie Kluver Entered By: Massie Kluver on 12/18/2021 09:32:19 Danowski, [name] ([id number]) [id number] Page 4 of 9 -------------------------------------------------------------------------------- Multi Wound Chart Details Patient Name: Date of Service: [name], [name]. 12/18/2021 9:00 [name] Medical Record Number: [id number] Patient Account Number: [id number] Date of Birth/Sex: Treating RN: [birth year] (81 y.o. 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 Primary Etiology: Extremity Chronic sinus problems/congestion, N/[name] N/[name] Comorbid History: Anemia, Sleep Apnea, Congestive Heart Failure, Hypertension, Type II Diabetes, Neuropathy 02/23/2020 N/[name] N/[name] Date Acquired: 35 N/[name] N/[name] Weeks of Treatment: Open N/[name] N/[name] Wound Status: No N/[name] N/[name] Wound Recurrence: 0.3x0.3x0.4 N/[name] N/[name] Measurements L x W x D (cm) 0.071 N/[name] N/[name] [name] (cm) : rea 0.028 N/[name] N/[name] Volume (cm) : 98.50% N/[name] N/[name] % Reduction in [name] rea: 94.10% N/[name] N/[name] % Reduction in Volume: Grade 1 N/[name] N/[name] Classification: Medium N/[name] N/[name] Exudate [name] mount: Serosanguineous N/[name] N/[name] Exudate Type: red, brown N/[name] N/[name] Exudate Color: Thickened N/[name] N/[name] Wound Margin: Large (67-100%) N/[name] N/[name] Granulation [name] mount: Pink N/[name] N/[name] Granulation Quality: Small (1-33%) N/[name] N/[name] Necrotic [name] mount: Fat Layer (Subcutaneous  Tissue): Yes N/[name] N/[name] Exposed Structures: Small (1-33%) N/[name] N/[name] Epithelialization: Treatment Notes Electronic Signature(s) Signed: 12/21/2021 5:32:10 PM By: Massie Kluver Entered By: Massie Kluver on 12/18/2021 09:32:35 Liotta, [name] ([id number]) [id number] Page 5 of 9 -------------------------------------------------------------------------------- Multi-Disciplinary Care Plan Details Patient Name: Date of Service: [name], [name]. 12/18/2021 9:00 [name] Medical Record Number: [id number] Patient Account Number: [id number] Date of Birth/Sex: Treating RN: 1940-11-23 (81 y.o. [name] Primary Care Karmello Abercrombie: Claris Gower Other Clinician: Massie Kluver Referring Tamisha Nordstrom: Treating Wileen Duncanson/Extender: Linton Flemings in Treatment: [phone] Active Inactive Necrotic Tissue Nursing Diagnoses: Impaired tissue  integrity related to necrotic/devitalized tissue Knowledge deficit related to management of necrotic/devitalized tissue Goals: Necrotic/devitalized tissue will be minimized in the wound bed Date Initiated: 11/06/2021 Target Resolution Date: 11/06/2021 Goal Status: Active Patient/caregiver will verbalize understanding of reason and process for debridement of necrotic tissue Date Initiated: 11/06/2021 Target Resolution Date: 11/06/2021 Goal Status: Active Interventions: Assess patient pain level pre-, during and post procedure and prior to discharge Provide education on necrotic tissue and debridement process Treatment Activities: Apply topical anesthetic as ordered : 11/06/2021 Excisional debridement : 11/06/2021 Notes: Pressure Nursing Diagnoses: Knowledge deficit related to management of pressures ulcers Potential for impaired tissue integrity related to pressure, friction, moisture, and shear Goals: Patient/caregiver will verbalize risk factors for pressure ulcer development Date Initiated: 11/27/2021 Target Resolution Date:  11/27/2021 Goal Status: Active Patient/caregiver will verbalize understanding of pressure ulcer management Date Initiated: 11/27/2021 Target Resolution Date: 11/28/2021 Goal Status: Active Interventions: Assess potential for pressure ulcer upon admission and as needed Provide education on pressure ulcers Notes: Wound/Skin Impairment Nursing Diagnoses: Impaired tissue integrity Knowledge deficit related to ulceration/compromised skin integrity Goals: Ulcer/skin breakdown will have [name] volume reduction of 30% by week 4 Date Initiated: 04/17/2021 Date Inactivated: 07/03/2021 Target Resolution Date: 05/15/2021 Goal Status: Unmet Unmet Reason: comorbities Ulcer/skin breakdown will have [name] volume reduction of 50% by week 8 Date Initiated: 04/17/2021 Date Inactivated: 07/03/2021 Target Resolution Date: 06/12/2021 Goal Status: Met Ulcer/skin breakdown will have [name] volume reduction of 80% by week 12 Date Initiated: 04/17/2021 Target Resolution Date: 07/10/2021 Goal Status: Active Ulcer/skin breakdown will heal within 14 weeks Date Initiated: 04/17/2021 Target Resolution Date: 07/24/2021 Goal Status: Active [name] ([id number]) [id number] Page 6 of 9 Interventions: Assess patient/caregiver ability to obtain necessary supplies Assess patient/caregiver ability to perform ulcer/skin care regimen upon admission and as needed Assess ulceration(s) every visit Provide education on ulcer and skin care Notes: Electronic Signature(s) Signed: 12/18/2021 11:05:28 AM By: Gretta Cool, BSN, RN, CWS, [name] RN, BSN Signed: 12/21/2021 5:32:10 PM By: Massie Kluver Entered By: Massie Kluver on 12/18/2021 09:32:24 -------------------------------------------------------------------------------- Pain Assessment Details Patient Name: Date of Service: Cheryl Bruns, MA RY [name]. 12/18/2021 9:00 [name] Medical Record Number: [id number] Patient Account Number: [id number] Date of Birth/Sex: Treating  RN: [birth year] (81 y.o. [name] Primary Care Lashonna Rieke: Claris Gower Other Clinician: Massie Kluver Referring Kebrina Friend: Treating Jamise Pentland/Extender: Linton Flemings in Treatment: 35 Active Problems Location of Pain Severity and Description of Pain Patient Has Paino No Site Locations Pain Management and Medication Current Pain Management: Electronic Signature(s) Signed: 12/18/2021 11:05:28 AM By: Gretta Cool, BSN, RN, CWS, [name] RN, BSN Signed: 12/21/2021 5:32:10 PM By: Massie Kluver Entered By: Massie Kluver on 12/18/2021 09:26:24 [name] ([id number]) [id number] Page 7 of 9 -------------------------------------------------------------------------------- Patient/Caregiver Education Details Patient Name: Date of Service: [name], [name]. 10/27/2023andnbsp9:00 [name] Medical Record Number: [id number] Patient Account Number: [id number] Date of Birth/Gender: Treating RN: January 02, 1941 (81 y.o. [name] Primary Care Physician: Claris Gower Other Clinician: Massie Kluver Referring Physician: Treating Physician/Extender: Linton Flemings in Treatment: 13 Education Assessment Education Provided To: Patient Education Topics Provided Wound/Skin Impairment: Handouts: Other: continue wound care as directed Methods: Explain/Verbal Responses: State content correctly Electronic Signature(s) Signed: 12/21/2021 5:32:10 PM By: Massie Kluver Entered By: Massie Kluver on 12/18/2021 10:03:07 -------------------------------------------------------------------------------- Wound Assessment Details Patient Name: Date of Service: Cheryl Bruns, MA RY [name]. 12/18/2021 9:00 [name] Medical Record Number: [id number] Patient Account Number: [id number] Date of Birth/Sex: Treating RN: February 16, 1941 (81 y.o. F) Gretta Cool,  [place] Primary Care Kazuto Sevey: Claris Gower Other Clinician: Massie Kluver Referring Aylene Acoff: Treating Akela Pocius/Extender:  Linton Flemings in Treatment: 35 Wound Status Wound Number: 1 Primary Diabetic Wound/Ulcer of the Lower Extremity Etiology: Wound Location: Left T Great oe Wound Open Wounding Event: Gradually Appeared Status: Date Acquired: 02/23/2020 Comorbid Chronic sinus problems/congestion, Anemia, Sleep Apnea, Weeks Of Treatment: 35 History: Congestive Heart Failure, Hypertension, Type II Diabetes, Clustered Wound: No Neuropathy Photos [name] ([id number]) [id number] Page 8 of 9 Wound Measurements Length: (cm) 0.3 Width: (cm) 0.3 Depth: (cm) 0.4 Area: (cm) 0.071 Volume: (cm) 0.028 % Reduction in Area: 98.5% % Reduction in Volume: 94.1% Epithelialization: Small (1-33%) Tunneling: No Undermining: No Wound Description Classification: Grade 1 Wound Margin: Thickened Exudate Amount: Medium Exudate Type: Serosanguineous Exudate Color: red, brown Foul Odor After Cleansing: No Slough/Fibrino Yes Wound Bed Granulation Amount: Large (67-100%) Exposed Structure Granulation Quality: Pink Fat Layer (Subcutaneous Tissue) Exposed: Yes Necrotic Amount: Small (1-33%) Necrotic Quality: Adherent Slough Treatment Notes Wound #1 (Toe Great) Wound Laterality: Left Cleanser [place] Ancillary Kit - 15 Day Supply Discharge Instruction: Use supplies as instructed; Kit contains: (15) Saline Bullets; (15) 3x3 Gauze; 15 pr Gloves Normal Saline Discharge Instruction: Wash your hands with soap and water. Remove old dressing, discard into plastic bag and place into trash. Cleanse the wound with Normal Saline prior to applying [name] clean dressing using gauze sponges, not tissues or cotton balls. Do not scrub or use excessive force. Pat dry using gauze sponges, not tissue or cotton balls. Peri-Wound Care Topical Primary Dressing SILVERCEL Antimicrobial Alginate Dressing, 1x12 (in/in) Secondary Dressing Coverlet Latex-Free Fabric Adhesive Dressings Discharge  Instruction: 1.5 x 2 Secured With Compression Wrap Compression Stockings Add-Ons Gauze Non-Bordered 4x4 (in/in) Discharge Instruction: Cover with dry gauze, rolled up and placed under left great toe Electronic Signature(s) Signed: 12/18/2021 11:05:28 AM By: Gretta Cool, BSN, RN, CWS, [name] RN, BSN Signed: 12/21/2021 5:32:10 PM By: Massie Kluver Entered By: Massie Kluver on 12/18/2021 09:31:34 [name] ([id number]) [id number] Page 9 of 9 -------------------------------------------------------------------------------- Vitals Details Patient Name: Date of Service: [name], [name]. 12/18/2021 9:00 [name] Medical Record Number: [id number] Patient Account Number: [id number] Date of Birth/Sex: Treating RN: 1940/03/02 (81 y.o. [name] Primary Care Rafeef Lau: Claris Gower Other Clinician: Massie Kluver Referring Terryl Molinelli: Treating Corayma Cashatt/Extender: Linton Flemings in Treatment: 35 Vital Signs Time Taken: 09:24 Temperature (F): 98.0 Height (in): 61 Pulse (bpm): 86 Weight (lbs): 250 Respiratory Rate (breaths/min): 18 Body Mass Index (BMI): 47.2 Blood Pressure (mmHg): 154/80 Reference Range: 80 - 120 mg / dl Electronic Signature(s) Signed: 12/21/2021 5:32:10 PM By: Massie Kluver Entered By: Massie Kluver on 12/18/2021 09:26:19

## 2021-12-25 ENCOUNTER — Encounter: Payer: Medicare Other | Attending: Physician Assistant | Admitting: Physician Assistant

## 2021-12-25 DIAGNOSIS — E1140 Type 2 diabetes mellitus with diabetic neuropathy, unspecified: Secondary | ICD-10-CM | POA: Diagnosis not present

## 2021-12-25 DIAGNOSIS — Z89421 Acquired absence of other right toe(s): Secondary | ICD-10-CM | POA: Insufficient documentation

## 2021-12-25 DIAGNOSIS — E11621 Type 2 diabetes mellitus with foot ulcer: Secondary | ICD-10-CM | POA: Diagnosis present

## 2021-12-25 DIAGNOSIS — L97522 Non-pressure chronic ulcer of other part of left foot with fat layer exposed: Secondary | ICD-10-CM | POA: Insufficient documentation

## 2021-12-25 DIAGNOSIS — L97822 Non-pressure chronic ulcer of other part of left lower leg with fat layer exposed: Secondary | ICD-10-CM | POA: Insufficient documentation

## 2021-12-25 DIAGNOSIS — I110 Hypertensive heart disease with heart failure: Secondary | ICD-10-CM | POA: Insufficient documentation

## 2021-12-25 DIAGNOSIS — I509 Heart failure, unspecified: Secondary | ICD-10-CM | POA: Diagnosis not present

## 2021-12-25 NOTE — Progress Notes (Signed)
[place], [name] ([id number]) [id number] Page 1 of 2 Visit Report for 12/25/2021 Chief Complaint Document Details Patient Name: Date of Service: [name], [place] RY [name]. 12/25/2021 9:00 [name] M Medical Record Number: [id number] Patient Account Number: [id number] Date of Birth/Sex: Treating RN: 04/07/1940 (81 y.o. [name] Primary Care Provider: Claris Gower Other Clinician: Massie Kluver Referring Provider: Treating Provider/Extender: Linton Flemings in Treatment: 36 Information Obtained from: Patient Chief Complaint Multiple toe ulcersations Electronic Signature(s) Signed: 12/25/2021 9:48:29 AM By: Worthy Keeler PA-C Entered By: Worthy Keeler on 12/25/2021 09:48:28 -------------------------------------------------------------------------------- Problem List Details Patient Name: Date of Service: Cheryl Bruns, MA RY [name]. 12/25/2021 9:00 [name] M Medical Record Number: [id number] Patient Account Number: [id number] Date of Birth/Sex: Treating RN: [birth year] (81 y.o. 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

## 2022-01-01 ENCOUNTER — Encounter: Payer: Medicare Other | Admitting: Internal Medicine

## 2022-01-06 ENCOUNTER — Other Ambulatory Visit: Payer: Self-pay | Admitting: Physician Assistant

## 2022-01-06 MED ORDER — AMOXICILLIN 500 MG PO TABS: ORAL_TABLET | ORAL | 0 refills | Status: DC

## 2022-01-08 ENCOUNTER — Encounter: Payer: Medicare Other | Admitting: Physician Assistant

## 2022-01-08 DIAGNOSIS — E11621 Type 2 diabetes mellitus with foot ulcer: Secondary | ICD-10-CM | POA: Diagnosis not present

## 2022-01-08 NOTE — Progress Notes (Signed)
[name], [name] ([id number]) [id number] Page 1 of 10 Visit Report for 01/08/2022 Chief Complaint Document Details Patient Name: Date of Service: [name], [place] RY [name]. 01/08/2022 9:00 [name] M Medical Record Number: [id number] Patient Account Number: [id number] Date of Birth/Sex: Treating RN: 12/27/1940 (81 y.o. [name]: [name] Other Clinician: Massie [name] Referring [name]: Treating [name]/Extender: [name] in Treatment: 38 Information Obtained from: Patient Chief Complaint Multiple toe ulcersations Electronic Signature(s) Signed: 01/08/2022 9:30:19 AM By: Worthy Keeler PA-C Entered By: Worthy Keeler on 01/08/2022 09:30:19 -------------------------------------------------------------------------------- Debridement Details Patient Name: Date of Service: Cheryl Bruns, MA RY [name]. 01/08/2022 9:00 [name] M Medical Record Number: [id number] Patient Account Number: [id number] Date of Birth/Sex: Treating RN: 06-10-1940 (81 y.o. [name] Primary Care [name]: [name] Other Clinician: Massie [name] Referring [name]: Treating [name]/Extender: [name] in Treatment: 38 Debridement Performed for Assessment: Wound #1 Left T Great oe Performed By: Physician Cheryl Sams., PA-C Debridement Type: Debridement Severity of Tissue Pre Debridement: Fat layer exposed Level of Consciousness (Pre-procedure): Awake and Alert Pre-procedure Verification/Time Out Yes - 09:31 Taken: Start Time: 09:31 T Area Debrided (L x W): otal 1 (cm) x 1 (cm) = 1 (cm) Tissue and other material debrided: Viable, Non-Viable, Callus, Slough, Subcutaneous, Slough Level: Skin/Subcutaneous Tissue Debridement Description: Excisional Instrument: Curette Bleeding: Minimum Hemostasis Achieved: Pressure End Time: 09:35 Response to Treatment: Procedure was tolerated well Level of Consciousness (Post-  Awake and Alert procedure): [name] ([id number]) [id number] Page 2 of 10 Post Debridement Measurements of Total Wound Length: (cm) 0.3 Width: (cm) 0.5 Depth: (cm) 0.5 Volume: (cm) 0.059 Character of Wound/Ulcer Post Debridement: Stable Severity of Tissue Post Debridement: Fat layer exposed Post Procedure Diagnosis Same as Pre-procedure Electronic Signature(s) Unsigned Entered ByMassie [name] on 01/08/2022 09:35:46 -------------------------------------------------------------------------------- HPI Details Patient Name: Date of Service: [name], [place] [name]. 01/08/2022 9:00 [name] M Medical Record Number: [id number] Patient Account Number: [id number] Date of Birth/Sex: Treating RN: [birth year] (81 y.o. [name]: [name] Other Clinician: Massie [name] Referring [name]: Treating [name]/Extender: [name] in Treatment: 38 History of Present Illness HPI Description: 04/17/2021 upon evaluation patient appears to be doing somewhat poorly currently in regard to her first and second toe of the left foot. She previously has been seen by Dr. Doran Durand she saw him on 31 January he did not feel there was any evidence of osteomyelitis. He did give her [name] thorough evaluation including x-rays and showed no abnormal findings according to notes. With that being said he felt like that wound care would be beneficial therefore she contacted [place]. She has currently been using antibiotic ointment and has noted that this wound on the great toe has been there for about [name] year. She is not certain whether there is anything on the second toe or not she has not really noted any drainage but at the same time she cannot be sure there is nothing hiding up underneath [name] significant callus here. Her feet really do not have any major complications with regard to pain and that is good news. She does have neuropathy. Patient does have  [name] history of diabetes mellitus type 2, hypertension, and her most recent hemoglobin A1c was 7.6 on June 2022. 05/08/2021 upon evaluation today patient appears to be doing poorly in regard to her toes unfortunately she has [name] lot of callus buildup. Its been almost [name] month since I last  saw her. Obviously in this amount of time she has built up quite [name] bit of callus she was sick that is the reason she did not make it in. Nonetheless I do think that weekly visits is probably can be the best thing is asking to help to allow these areas to heal much more effectively and quickly. Fortunately I do not see any evidence of active infection locally or systemically which is great news. 05/15/2021 upon evaluation today patient appears to be doing well currently in regard to her wound. Overall I think that we are definitely headed in the right direction with regard to the toes. Fortunately I do not see any signs of active infection locally or systemically at this time which is great news. No fevers, chills, nausea, vomiting, or diarrhea. 05/22/2021 upon evaluation today patient appears to be doing well with regard to her wound. She has been tolerating the dressing changes without complication. Fortunately the second toe is healed the first toe on the left foot is still open but does not appear to be doing nearly as poorly at this time. I do think we can perform [name] little bit of debridement in order to clear away some of the necrotic debris patient is in agreement with that plan. 05-29-2021 upon evaluation today patient appears to be doing worse currently in regard to the pain experience. Her leg is still very red in fact I think is [name] little bit worse than last week unfortunately. Last week I was even concerned about [name] little bit of cellulitis she really felt like it was more related to her swelling but again I am not certain that that is necessarily the case. Fortunately I do not see any signs of active infection at this  time systemically though locally I am definitely concerned in this regard. 06-05-2021 upon evaluation today patient appears to be doing well with regard to her toe ulcer this is worse is also not better though. Unfortunately it is hurting more in her leg in general is also bothering her. This is despite being. She is also tolerating Levaquin with making her dizzy. Nonetheless I am not certain its helping after she has been on this week I am not seeing much of the improvement with regard to the cellulitis in her leg. She is also previously been on doxycycline although that was towards the end of February. 06-12-2021 upon evaluation today patient appears to be doing well with regard to her wound. We are definitely seeing signs of improvement with regard to size. With that being said I do not see any evidence of active infection at this time which is great news and very pleased in that regard. She is going require some debridement both for callus as well as clearing away some of the slough and biofilm. Stroh, [name] ([id number]) [id number] Page 3 of 10 07-03-2021 upon evaluation today patient appears to be doing well with regard to his wound on the foot. With that being said he unfortunately did have some of the padding along the heel that got pushed down and subsequently he tells me that it was feeling like it was bunched up under his foot and the heel location and bothering him over the past couple of days. With that being said that when we remove the cast today unbeknownst to [facility] the padding had been pushed down and the cast saw actually nicked his heel this looks like [name] very light abrasion. Nonetheless he does not have any pain and there  is no active bleeding at this time. I actually did take [name] picture to show him as well and we documented it we will see how this looks obviously next week but I think this will be healed by Wednesday. The wound itself looks to be doing  much better there is some callus to remove some slough and biofilm as well. 07-10-2021 upon evaluation today patient appears to be doing well with regard to her wounds. She has been tolerating the dressing changes without complication. Actually I feel like the foot is looking [name] little better in regard to the great toe. Everything else is doing better as well. I feel like she has been much more careful with that and from the callus standpoint things are greatly improved it also helps that she shows up on [name] regular basis. With that being said we are going to be seeing her following her MRI that scheduled for next week. 07-24-2021 upon evaluation today patient appears to be doing well with regard to her wound. She actually is shown signs of improvement there are still some callus not nearly as bad as what it is then I think we need to try to see what we can do to get some of this off today but hopefully will continue to show signs of improvement with the current regimen. She has thought about the hyperbarics she tells me there is just really not can be [name] way for her to get here regularly for that. 07-31-2021 upon evaluation today patient appears to be doing well with regard to her toe ulcer although its not significantly smaller its not worse either. She still has [name] lot of callus buildup. With that being said I think we need to do something to try to keep pressure off of the toe. I think [name] bolster underneath the big toe could be beneficial in this regard. 08-14-2021 upon evaluation today patient's wound actually showed signs of some improvement she still had callus but for 2 weeks this was not bad at all compared to what we normally find. I do think we are on the right track here and the good news is she seems to be making some excellent progress in my opinion. 08-21-2021 upon evaluation today patient appears to be doing well with regard to her toe ulcer. Unfortunately her silicone toe spacer was too tight for  her toe by the time she got home and was already causing her pain. For that reason she has not been wearing that she is putting the rolled up gauze up underneath. Obviously I think this is still [name] good way to go and better than nothing the biggest thing is that she still is walking without shoes at home I discussed this with her previously but again I have that discussion again today I want her to have shoes on at all times she has been going around just in her "sock feet". 10-02-2021 upon evaluation today patient appears to be doing poorly in regard to her toe. She is actually been in sinus June 30 when I last saw her. She has had [name] lot of issues with her kidneys and then she also had her little puppy passed away she tells me. Unfortunately I think this has weighed heavily on her. Fortunately there does not appear to be any evidence of active infection at this time which is great news. No fevers, chills, nausea, vomiting, or diarrhea. 10-09-2021 upon evaluation today patient appears to be doing well currently in regard to her  wound. She has been tolerating the dressing changes without complication. Fortunately there does not appear to be any signs of active infection locally or systemically at this time which is great news. No fevers, chills, nausea, vomiting, or diarrhea. 10-16-2021 upon evaluation today patient appears to be doing well with regard to her toe ulcer I really feel like this is showing signs of improvement which is great news. Fortunately there does not appear to be any evidence of infection locally or systemically which is great news. No fevers, chills, nausea, vomiting, or diarrhea. 10-23-2021 upon evaluation today patient's wound is showing signs of still having quite [name] bit of callus around the edge we are making [name] bolster to go underneath the toe but she still has quite [name] bit of drainage noted at this point. Fortunately there does not appear to be any signs of infection. 10-30-2021 upon  evaluation today patient appears to be doing well currently in regard to her wound. She has been tolerating the dressing changes without complication. Fortunately there does not appear to be any evidence of active infection locally or systemically at this time. No fevers, chills, nausea, vomiting, or diarrhea. 11-06-2021 upon evaluation today patient appears to be doing well currently in regard to her toe although it is about the same as may be slightly smaller. Fortunately I do not see any evidence of infection currently which is good news. 11-13-2021 upon evaluation today patient's wound actually appears to be very macerated compared to previous. Fortunately I do not see any signs of active infection locally or systemically at this time which is great news. Nonetheless I do feel like that she has [name] lot of pressure issue here. We have previously done an x-ray and MRI which showed no signs of osteomyelitis. We have also done arterial studies to ensure blood flow was good pulmonary only this shows triphasic waveforms with an ABI of 1.08 which is excellent. That was just completed and in fact the plan final report is not even back yet. Nonetheless overall I think that none of this is [name] complicating factor for her I think the biggest issue is friction and pressure. That is what we have to figure out [name] more appropriately managed unfortunately I do not think she is [name] good candidate for total contact casting due to her balance. 11-20-2021 upon evaluation today patient appears to be doing well currently in regard to her toe ulcer. She has been tolerating the dressing changes without complication. Fortunately there does not appear to be any evidence of active infection locally or systemically at this time which is great news. I am actually very pleased with how things appear. 11-27-2021 upon evaluation today patient's wound actually is showing signs of still having some callus buildup obviously there is still  rubbing occurring. With that being said I do believe that the patient would benefit from the use of some type of more aggressive offloading but unfortunately her balance just precludes this we have even discussed casting. With that being said I do think that she is doing [name] little better though I think if we can get more the pressure and friction from occurring on the end of this toe she would be even better. 12-18-2021 upon evaluation today patient appears to be doing well currently in regard to her toe although she does have [name] lot more callus that is known and expected considering it has been 2 weeks since I last saw her she builds up quite [name] bit of callus. Nonetheless  I think overall the wound might actually be [name] little bit smaller. 12-25-2021 upon evaluation patient's wound on the toe actually is showing signs of significant improvement. I am very pleased with where things stand I do believe that she is headed in the right direction at this point. I do not see any signs of infection locally or systemically at this time which is great news as well. 01-08-2022 upon evaluation today patient appears to be doing well currently in regard to her wound. She has been tolerating the dressing changes without complication. She does have some callus buildup it has been 2 weeks though since we have seen her due to her transportation situation. Electronic Signature(s) Signed: 01/08/2022 9:36:45 AM By: Worthy Keeler PA-C Entered By: Worthy Keeler on 01/08/2022 09:36:44 Umbaugh, [name] ([id number]) [id number] Page 4 of 10 -------------------------------------------------------------------------------- Physical Exam Details Patient Name: Date of Service: [name], [place] RY [name]. 01/08/2022 9:00 [name] M Medical Record Number: [id number] Patient Account Number: [id number] Date of Birth/Sex: Treating RN: May 05, 1940 (81 y.o. [name]: [name] Other Clinician:  Massie [name] Referring [name]: Treating [name]/Extender: [name] in Treatment: 22 Constitutional Well-nourished and well-hydrated in no acute distress. Respiratory normal breathing without difficulty. Psychiatric this patient is able to make decisions and demonstrates good insight into disease process. Alert and Oriented x 3. pleasant and cooperative. Notes Upon inspection patient's wound bed actually showed signs of good granulation and epithelization at this point. Fortunately I do not see any evidence of infection locally or systemically which is great news and overall I am extremely pleased with where we stand. Electronic Signature(s) Signed: 01/08/2022 9:37:02 AM By: Worthy Keeler PA-C Entered By: Worthy Keeler on 01/08/2022 09:37:02 -------------------------------------------------------------------------------- Physician Orders Details Patient Name: Date of Service: Cheryl Bruns, [place] RY [name]. 01/08/2022 9:00 [name] M Medical Record Number: [id number] Patient Account Number: [id number] Date of Birth/Sex: Treating RN: 19-May-1940 (81 y.o. 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 Nurse Visit as needed [employer] wounds with antibacterial soap  and water. May shower; gently cleanse wound with antibacterial soap, rinse and pat dry prior to dressing wounds No tub bath. Anesthetic (Use 'Patient Medications' Section for Anesthetic Order Entry) Lidocaine applied to wound bed Edema Control - Lymphedema / Segmental Compressive Device / Other Elevate, Exercise Daily and [name] void Standing for Long Periods of Time. Elevate legs to the level of the heart and pump ankles as often as possible Elevate leg(s) parallel to the floor when sitting. DO YOUR BEST to sleep in the bed at night. DO NOT sleep in your recliner. Long hours of sitting in [name] recliner leads to swelling of the legs and/or potential wounds on your backside. Off-Loading Open toe surgical shoe - left foot Other: - Rolled 2 gauze up and taped for bolster under great toe. Cut into 1/3s. Gave additional 2 to patient to have  for dressing changes. Wound Treatment Wound #1 - T Great oe Wound Laterality: Left Cleanser: [employer] Ancillary Kit - 15 Day Supply (Generic) Every Other Day/30 Days Discharge Instructions: Use supplies as instructed; Kit contains: (15) Saline Bullets; (15) 3x3 Gauze; 15 pr Gloves Cleanser: Normal Saline Every Other Day/30 Days Discharge Instructions: Wash your hands with soap and water. Remove old dressing, discard into plastic bag and place into trash. Cleanse the wound with Normal Saline prior to applying [name] clean dressing using gauze sponges, not tissues or cotton balls. Do not scrub or use excessive force. Pat dry using gauze sponges, not tissue or cotton balls. Prim Dressing: SILVERCEL Antimicrobial Alginate Dressing, 1x12 (in/in) ary Every Other Day/30 Days Secondary Dressing: Coverlet Latex-Free Fabric Adhesive Dressings Every Other Day/30 Days Discharge Instructions: 1.5 x 2 Add-Ons: Gauze Non-Bordered 4x4 (in/in) Every Other Day/30 Days Discharge Instructions: Cover with dry gauze, rolled up and placed under left great toe Electronic  Signature(s) Unsigned Entered By: [name] on 01/08/2022 09:36:14 -------------------------------------------------------------------------------- Problem List Details Patient Name: Date of Service: [name], [place] [name]. 01/08/2022 9:00 [name] M Medical Record Number: [id number] Patient Account Number: [id number] Date of Birth/Sex: Treating RN: [birth year] (81 y.o. 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 01/08/2022 9:00 [name] M Medical Record Number: [id number] Patient Account Number: [id number] Date of Birth/Sex: Treating RN: 1940-09-29 (81 y.o. [name]: [name] Other Clinician: Massie [name] Referring [name]: Treating [name]/Extender: [name] in Treatment: 38 Subjective Chief Complaint Information obtained from Patient Multiple toe ulcersations History of  Present Illness (HPI) 04/17/2021 upon evaluation patient appears to be doing somewhat poorly currently in regard to her first and second toe of the left foot. She previously has been seen by Dr. Doran Durand she saw him on 31 January he did not feel there was any evidence of osteomyelitis. He did give her [name] thorough evaluation including x-rays and showed no abnormal findings according to notes. With that being said he felt like that wound care would be beneficial therefore she contacted [place]. She has currently been using antibiotic ointment and has noted that this wound on the great toe has been there for about [name] year. She is not certain whether there is anything on the second toe or not she has not really noted any drainage but at the same time she cannot be sure there is nothing hiding up underneath [name] significant callus here. Her feet really do not have any major complications with regard to pain and that is good news. She does have neuropathy. Patient does have [name] history of diabetes mellitus type 2, hypertension, and her most recent hemoglobin  A1c was 7.6 on June 2022. 05/08/2021 upon evaluation today patient appears to be doing poorly in regard to her toes unfortunately she has [name] lot of callus buildup. Its been almost [name] month since I last saw her. Obviously in this amount of time she has built up quite [name] bit of callus she was sick that is the reason she did not make it in. Nonetheless I do think that weekly visits is probably can be the best thing is asking to help to allow these areas to heal much more effectively and quickly. Fortunately I do not see any evidence of active infection locally or systemically which is great news. Loken, [name] ([id number]) [id number] Page 7 of 10 05/15/2021 upon evaluation today patient appears to be doing well currently in regard to her wound. Overall I think that we are definitely headed in the right direction with regard to the toes.  Fortunately I do not see any signs of active infection locally or systemically at this time which is great news. No fevers, chills, nausea, vomiting, or diarrhea. 05/22/2021 upon evaluation today patient appears to be doing well with regard to her wound. She has been tolerating the dressing changes without complication. Fortunately the second toe is healed the first toe on the left foot is still open but does not appear to be doing nearly as poorly at this time. I do think we can perform [name] little bit of debridement in order to clear away some of the necrotic debris patient is in agreement with that plan. 05-29-2021 upon evaluation today patient appears to be doing worse currently in regard to the pain experience. Her leg is still very red in fact I think is [name] little bit worse than last week unfortunately. Last week I was even concerned about [name] little bit of cellulitis she really felt like it was more related to her swelling but again I am not certain that that is necessarily the case. Fortunately I do not see any signs of active infection at this time systemically though locally I am definitely concerned in this regard. 06-05-2021 upon evaluation today patient appears to be doing well with regard to her toe ulcer this is worse is also not better though. Unfortunately it is hurting more in her leg in general is also bothering her. This is despite being. She is also tolerating Levaquin with making her dizzy. Nonetheless I am not certain its helping after she has been on this week I am not seeing much of the improvement with regard to the cellulitis in her leg. She is also previously been on doxycycline although that was towards the end of February. 06-12-2021 upon evaluation today patient appears to be doing well with regard to her wound. We are definitely seeing signs of improvement with regard to size. With that being said I do not see any evidence of active infection at this time which is great news and  very pleased in that regard. She is going require some debridement both for callus as well as clearing away some of the slough and biofilm. 07-03-2021 upon evaluation today patient appears to be doing well with regard to his wound on the foot. With that being said he unfortunately did have some of the padding along the heel that got pushed down and subsequently he tells me that it was feeling like it was bunched up under his foot and the heel location and bothering him over the past couple of days. With that being said that when  we remove the cast today unbeknownst to [facility] the padding had been pushed down and the cast saw actually nicked his heel this looks like [name] very light abrasion. Nonetheless he does not have any pain and there is no active bleeding at this time. I actually did take [name] picture to show him as well and we documented it we will see how this looks obviously next week but I think this will be healed by Wednesday. The wound itself looks to be doing much better there is some callus to remove some slough and biofilm as well. 07-10-2021 upon evaluation today patient appears to be doing well with regard to her wounds. She has been tolerating the dressing changes without complication. Actually I feel like the foot is looking [name] little better in regard to the great toe. Everything else is doing better as well. I feel like she has been much more careful with that and from the callus standpoint things are greatly improved it also helps that she shows up on [name] regular basis. With that being said we are going to be seeing her following her MRI that scheduled for next week. 07-24-2021 upon evaluation today patient appears to be doing well with regard to her wound. She actually is shown signs of improvement there are still some callus not nearly as bad as what it is then I think we need to try to see what we can do to get some of this off today but hopefully will continue to show signs of improvement  with the current regimen. She has thought about the hyperbarics she tells me there is just really not can be [name] way for her to get here regularly for that. 07-31-2021 upon evaluation today patient appears to be doing well with regard to her toe ulcer although its not significantly smaller its not worse either. She still has [name] lot of callus buildup. With that being said I think we need to do something to try to keep pressure off of the toe. I think [name] bolster underneath the big toe could be beneficial in this regard. 08-14-2021 upon evaluation today patient's wound actually showed signs of some improvement she still had callus but for 2 weeks this was not bad at all compared to what we normally find. I do think we are on the right track here and the good news is she seems to be making some excellent progress in my opinion. 08-21-2021 upon evaluation today patient appears to be doing well with regard to her toe ulcer. Unfortunately her silicone toe spacer was too tight for her toe by the time she got home and was already causing her pain. For that reason she has not been wearing that she is putting the rolled up gauze up underneath. Obviously I think this is still [name] good way to go and better than nothing the biggest thing is that she still is walking without shoes at home I discussed this with her previously but again I have that discussion again today I want her to have shoes on at all times she has been going around just in her "sock feet". 10-02-2021 upon evaluation today patient appears to be doing poorly in regard to her toe. She is actually been in sinus June 30 when I last saw her. She has had [name] lot of issues with her kidneys and then she also had her little puppy passed away she tells me. Unfortunately I think this has weighed heavily on her. Fortunately there does not  appear to be any evidence of active infection at this time which is great news. No fevers, chills, nausea, vomiting, or  diarrhea. 10-09-2021 upon evaluation today patient appears to be doing well currently in regard to her wound. She has been tolerating the dressing changes without complication. Fortunately there does not appear to be any signs of active infection locally or systemically at this time which is great news. No fevers, chills, nausea, vomiting, or diarrhea. 10-16-2021 upon evaluation today patient appears to be doing well with regard to her toe ulcer I really feel like this is showing signs of improvement which is great news. Fortunately there does not appear to be any evidence of infection locally or systemically which is great news. No fevers, chills, nausea, vomiting, or diarrhea. 10-23-2021 upon evaluation today patient's wound is showing signs of still having quite [name] bit of callus around the edge we are making [name] bolster to go underneath the toe but she still has quite [name] bit of drainage noted at this point. Fortunately there does not appear to be any signs of infection. 10-30-2021 upon evaluation today patient appears to be doing well currently in regard to her wound. She has been tolerating the dressing changes without complication. Fortunately there does not appear to be any evidence of active infection locally or systemically at this time. No fevers, chills, nausea, vomiting, or diarrhea. 11-06-2021 upon evaluation today patient appears to be doing well currently in regard to her toe although it is about the same as may be slightly smaller. Fortunately I do not see any evidence of infection currently which is good news. 11-13-2021 upon evaluation today patient's wound actually appears to be very macerated compared to previous. Fortunately I do not see any signs of active infection locally or systemically at this time which is great news. Nonetheless I do feel like that she has [name] lot of pressure issue here. We have previously done an x-ray and MRI which showed no signs of osteomyelitis. We have also done  arterial studies to ensure blood flow was good pulmonary only this shows triphasic waveforms with an ABI of 1.08 which is excellent. That was just completed and in fact the plan final report is not even back yet. Nonetheless overall I think that none of this is [name] complicating factor for her I think the biggest issue is friction and pressure. That is what we have to figure out [name] more appropriately managed unfortunately I do not think she is [name] good candidate for total contact casting due to her balance. 11-20-2021 upon evaluation today patient appears to be doing well currently in regard to her toe ulcer. She has been tolerating the dressing changes without complication. Fortunately there does not appear to be any evidence of active infection locally or systemically at this time which is great news. I am actually very pleased with how things appear. 11-27-2021 upon evaluation today patient's wound actually is showing signs of still having some callus buildup obviously there is still rubbing occurring. With that being said I do believe that the patient would benefit from the use of some type of more aggressive offloading but unfortunately her balance just precludes this we have even discussed casting. With that being said I do think that she is doing [name] little better though I think if we can get more the pressure and friction from occurring on the end of this toe she would be even better. 12-18-2021 upon evaluation today patient appears to be doing well currently in  regard to her toe although she does have [name] lot more callus that is known and expected considering it has been 2 weeks since I last saw her she builds up quite [name] bit of callus. Nonetheless I think overall the wound might actually be [name] little Marengo, Carnell [name] ([id number]) [id number] Page 8 of 10 bit smaller. 12-25-2021 upon evaluation patient's wound on the toe actually is showing signs of significant improvement. I am  very pleased with where things stand I do believe that she is headed in the right direction at this point. I do not see any signs of infection locally or systemically at this time which is great news as well. 01-08-2022 upon evaluation today patient appears to be doing well currently in regard to her wound. She has been tolerating the dressing changes without complication. She does have some callus buildup it has been 2 weeks though since we have seen her due to her transportation situation. Objective Constitutional Well-nourished and well-hydrated in no acute distress. Vitals Time Taken: 9:12 AM, Height: 61 in, Weight: 250 lbs, BMI: 47.2, Temperature: 98.1 F, Pulse: 87 bpm, Respiratory Rate: 18 breaths/min, Blood Pressure: 157/76 mmHg. Respiratory normal breathing without difficulty. Psychiatric this patient is able to make decisions and demonstrates good insight into disease process. Alert and Oriented x 3. pleasant and cooperative. General Notes: Upon inspection patient's wound bed actually showed signs of good granulation and epithelization at this point. Fortunately I do not see any evidence of infection locally or systemically which is great news and overall I am extremely pleased with where we stand. Integumentary (Hair, Skin) Wound #1 status is Open. Original cause of wound was Gradually Appeared. The date acquired was: 02/23/2020. The wound has been in treatment 38 weeks. The wound is located on the Left T Great. The wound measures 0.1cm length x 0.3cm width x 0.5cm depth; 0.024cm^2 area and 0.012cm^3 volume. There is Fat oe Layer (Subcutaneous Tissue) exposed. There is no tunneling or undermining noted. There is [name] medium amount of serosanguineous drainage noted. The wound margin is thickened. There is large (67-100%) pink granulation within the wound bed. There is [name] small (1-33%) amount of necrotic tissue within the wound bed including Adherent Slough. 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 There was [name] Excisional Skin/Subcutaneous Tissue Debridement with [name] total area of 1 sq cm performed by Cheryl Sams., PA-C. With the following instrument(s): Curette to remove Viable and Non-Viable tissue/material. Material removed includes Callus, Subcutaneous Tissue, and Slough. [name] time out was conducted at 09:31, prior to the start of the procedure. [name] Minimum amount of bleeding was controlled with Pressure. The procedure was tolerated well. Post Debridement Measurements: 0.3cm length x 0.5cm width x 0.5cm depth; 0.059cm^3 volume. Character of Wound/Ulcer Post Debridement is stable. Severity of Tissue Post Debridement is: Fat layer exposed. Post procedure Diagnosis Wound #1: Same as Pre-Procedure Plan Follow-up Appointments: Return Appointment in 1 week. Nurse Visit as needed Bathing/ Shower/ Hygiene: Wash wounds with antibacterial soap and water. May shower; gently cleanse wound with antibacterial soap,  rinse and pat dry prior to dressing wounds No tub bath. Bley, [name] ([id number]) [id number] Page 9 of 10 [name] nesthetic (Use 'Patient Medications' Section for Anesthetic Order Entry): Lidocaine applied to wound bed Edema Control - Lymphedema / Segmental Compressive Device / Other: Elevate, Exercise Daily and Avoid Standing for Long Periods of Time. Elevate legs to the level of the heart and pump ankles as often as possible Elevate leg(s) parallel  to the floor when sitting. DO YOUR BEST to sleep in the bed at night. DO NOT sleep in your recliner. Long hours of sitting in [name] recliner leads to swelling of the legs and/or potential wounds on your backside. Off-Loading: Open toe surgical shoe - left foot Other: - Rolled 2 gauze up and taped for bolster under great toe. Cut into 1/3s. Gave additional 2 to patient to have for dressing changes. WOUND #1: - T Great Wound Laterality: Left oe Cleanser: [employer]ram Ancillary Kit - 15 Day Supply (Generic) Every Other Day/30 Days Discharge Instructions: Use supplies as instructed; Kit contains: (15) Saline Bullets; (15) 3x3 Gauze; 15 pr Gloves Cleanser: Normal Saline Every Other Day/30 Days Discharge Instructions: Wash your hands with soap and water. Remove old dressing, discard into plastic bag and place into trash. Cleanse the wound with Normal Saline prior to applying [name] clean dressing using gauze sponges, not tissues or cotton balls. Do not scrub or use excessive force. Pat dry using gauze sponges, not tissue or cotton balls. Prim Dressing: SILVERCEL Antimicrobial Alginate Dressing, 1x12 (in/in) Every Other Day/30 Days ary Secondary Dressing: Coverlet Latex-Free Fabric Adhesive Dressings Every Other Day/30 Days Discharge Instructions: 1.5 x 2 Add-Ons: Gauze Non-Bordered 4x4 (in/in) Every Other Day/30 Days Discharge Instructions: Cover with dry gauze, rolled up and placed under left great toe 1. I am going to recommend that we go ahead and continue with the silver alginate dressing I think this is done about as good as anything for her up to this point. 2. I am also can recommend that we have the patient continue to monitor for any signs of worsening or infection. Also if anything changes she knows contact the office let me know. 3. I would also suggest the patient should continue to use the offloading gauze under the toe to try to keep pressure and friction off of the tip of the toe I think that is  made [name] big difference for her as well when she does this. We will see patient back for reevaluation in 1 week here in the clinic. If anything worsens or changes patient will contact our office for additional recommendations. Electronic Signature(s) Signed: 01/08/2022 9:37:42 AM By: Worthy Keeler PA-C Entered By: Worthy Keeler on 01/08/2022 09:37:41 -------------------------------------------------------------------------------- SuperBill Details Patient Name: Date of Service: Cheryl Bruns, MA RY [name]. 01/08/2022 Medical Record Number: [id number] Patient Account Number: [id number] Date of Birth/Sex: Treating RN: Aug 17, 1940 (81 y.o. [name] Primary Care [name]: [name] Other Clinician: Massie [name] Referring [name]: Treating [name]/Extender: [name] in Treatment: 38 Diagnosis Coding ICD-10 Codes Code Description E11.621 Type 2 diabetes mellitus with foot ulcer L97.522 Non-pressure chronic ulcer of other part of left foot with fat layer exposed L97.822 Non-pressure chronic ulcer of other part of left lower leg with fat layer exposed Z89.411 Acquired absence of right great toe Z89.421 Acquired absence of other right toe(s) I10 Essential (primary) hypertension Facility Procedures : DAWKINSDelena Bali Code: [id number] ARY [name] ([id number]) ICD-10 L9 Description: 11042 - DEB SUBQ TISSUE 20 SQ  CM/< [id number] Diagnosis Description 7.522 Non-pressure chronic ulcer of other part of left foot with fat layer exposed Modifier: [id number] Page 10 Quantity: 1 of 10 Physician Procedures : CPT4 Code Description Modifier [id number] - WC PHYS SUBQ TISS 20 SQ CM ICD-10 Diagnosis Description L97.522 Non-pressure chronic ulcer of other part of left foot with fat layer exposed Quantity: 1 Electronic Signature(s) Signed: 01/08/2022 9:37:51 AM By: Worthy Keeler PA-C Entered By: Worthy Keeler on 01/08/2022 09:37:50

## 2022-01-11 NOTE — Progress Notes (Signed)
Cheryl, [name] ([id number]) [id number] Page 1 of 9 Visit Report for 01/08/2022 Arrival Information Details Patient Name: Date of Service: Cheryl Cheryl, [place] Cheryl. 01/08/2022 9:00 Cheryl Cheryl Medical Record Number: [id number] Patient Account Number: [id number] Date of Birth/Sex: Treating RN: 1940-10-15 (81 y.o. Cheryl Cheryl, [name] Primary Care Tommi Crepeau: Claris Gower Other Clinician: Massie Kluver Referring Mertis Mosher: Treating Cheryl Cheryl/Extender: Linton Flemings in Treatment: 38 Visit Information History Since Last Visit Added or deleted any medications: No Patient Arrived: Cheryl Cheryl Any new allergies or adverse reactions: No Arrival Time: 09:11 Had Cheryl fall or experienced change in No Transfer Assistance: None activities of daily living that may affect Patient Identification Verified: Yes risk of falls: Secondary Verification Process Completed: Yes Signs or symptoms of abuse/neglect since last visito No Patient Requires Transmission-Based Precautions: No Hospitalized since last visit: No Patient Has Alerts: No Implantable device outside of the clinic excluding No cellular tissue based products placed in the center since last visit: Has Dressing in Place as Prescribed: Yes Has Compression in Place as Prescribed: No Pain Present Now: No Electronic Signature(s) Signed: 01/08/2022 12:54:23 PM By: Massie Kluver Entered By: Massie Kluver on 01/08/2022 09:15:41 -------------------------------------------------------------------------------- Clinic Level of Care Assessment Details Patient Name: Date of Service: Cheryl Cheryl, [place] Cheryl. 01/08/2022 9:00 Cheryl Cheryl Medical Record Number: [id number] Patient Account Number: [id number] Date of Birth/Sex: Treating RN: 18-May-1940 (81 y.o. Cheryl Cheryl Primary Care Macenzie Burford: Claris Gower Other Clinician: Massie Kluver Referring Ark Agrusa: Treating Cheryl Cheryl/Extender: Linton Flemings in Treatment: 38 Clinic Level  of Care Assessment Items TOOL 1 Quantity Score _0  - 0 Use when EandM and Procedure is performed on INITIAL visit ASSESSMENTS - Nursing Assessment / Reassessment _1  - 0 General Physical Exam (combine w/ comprehensive assessment (listed just below) when performed on new pt. evals) _2  - 0 Comprehensive Assessment (HX, ROS, Risk Assessments, Wounds Hx, etc.) [name], Cheryl Cheryl ([id number]) [id number] Page 2 of 9 ASSESSMENTS - Wound and Skin Assessment / Reassessment _3  - 0 Dermatologic / Skin Assessment (not related to wound area) ASSESSMENTS - Ostomy and/or Continence Assessment and Care _4  - 0 Incontinence Assessment and Management _5  - 0 Ostomy Care Assessment and Management (repouching, etc.) PROCESS - Coordination of Care _6  - 0 Simple Patient / Family Education for ongoing care _7  - 0 Complex (extensive) Patient / Family Education for ongoing care _8  - 0 Staff obtains Programmer, systems, Records, T Results / Process Orders est _9  - 0 Staff telephones HHA, Nursing Homes / Clarify orders / etc _10  - 0 Routine Transfer to another Facility (non-emergent condition) _11  - 0 Routine Hospital Admission (non-emergent condition) _12  - 0 New Admissions / Biomedical engineer / Ordering NPWT Apligraf, etc. , _13  - 0 Emergency Hospital Admission (emergent condition) PROCESS - Special Needs _14  - 0 Pediatric / Minor Patient Management _15  - 0 Isolation Patient Management _16  - 0 Hearing / Language / Visual special needs _17  - 0 Assessment of Community assistance (transportation, D/C planning, etc.) _18  - 0 Additional assistance / Altered mentation _19  - 0 Support Surface(s) Assessment (bed, cushion, seat, etc.) INTERVENTIONS - Miscellaneous _20  - 0 External ear exam _21  - 0 Patient Transfer (multiple staff / Civil Service fast streamer / Similar devices) _22  - 0 Simple Staple / Suture removal (25 or less) _23  - 0 Complex Staple / Suture removal (26 or more) _24  - 0 Hypo/Hyperglycemic  Management (do not check if billed separately) _25  - 0 Ankle / Brachial Index (ABI) - do not check if billed separately Has  the patient been seen at the hospital within the last three years: Yes Total Score: 0 Level Of Care: ____ Electronic Signature(s) Signed: 01/08/2022 12:54:23 PM By: Massie Kluver Entered By: Massie Kluver on 01/08/2022 09:36:19 -------------------------------------------------------------------------------- Encounter Discharge Information Details Patient Name: Date of Service: Cheryl Bruns, [place] R[place] Cheryl. 01/08/2022 9:00 Cheryl Cheryl Medical Record Number: [id number] Patient Account Number: [id number] Date of Birth/Sex: Treating RN: Feb 19, 1941 (81 y.o. Cheryl Cheryl Primary Care Luke Rigsbee: Claris Gower Other Clinician: Massie Kluver Referring Estreya Clay: Treating Cheryl Cheryl/Extender: Cheryl Cheryl, [place] Cheryl ([id number]) [id number].pdf Page 3 of 9 Weeks in Treatment: 38 Encounter Discharge Information Items Post Procedure Vitals Discharge Condition: Stable Temperature (F): 98.1 Ambulatory Status: Cane Pulse (bpm): 81 Discharge Destination: Home Respiratory Rate (breaths/min): 18 Transportation: Private Auto Blood Pressure (mmHg): 157/76 Accompanied By: self Schedule Follow-up Appointment: Yes Clinical Summary of Care: Electronic Signature(s) Signed: 01/08/2022 12:54:23 PM By: Massie Kluver Entered By: Massie Kluver on 01/08/2022 09:44:32 -------------------------------------------------------------------------------- Lower Extremity Assessment Details Patient Name: Date of Service: Cheryl Cheryl, [place] Cheryl. 01/08/2022 9:00 Cheryl Cheryl Medical Record Number: [id number] Patient Account Number: [id number] Date of Birth/Sex: Treating RN: 04-Sep-1940 (81 y.o. Cheryl Cheryl, [name] Primary Care Katelee Schupp: Claris Gower Other Clinician: Massie Kluver Referring Yania Bogie: Treating Cheryl Cheryl/Extender: Linton Flemings in Treatment: 38 Edema  Assessment Assessed: [Left: Yes] [Right: No] Edema: [Left: Ye] [Right: s] Calf Left: Right: Point of Measurement: 30 cm From Medial Instep 37.6 cm Ankle Left: Right: Point of Measurement: 10 cm From Medial Instep 26.3 cm Vascular Assessment Pulses: Dorsalis Pedis Palpable: [Left:Yes] Electronic Signature(s) Signed: 01/08/2022 12:54:23 PM By: Massie Kluver Signed: 01/11/2022 1:46:28 PM By: Gretta Cool, BSN, RN, CWS, [name] RN, BSN Entered By: Massie Kluver on 01/08/2022 09:22:04 Vastine, [name] ([id number]) [id number] Page 4 of 9 -------------------------------------------------------------------------------- Multi Wound Chart Details Patient Name: Date of Service: Cheryl Cheryl, [place] RY Cheryl. 01/08/2022 9:00 Cheryl Cheryl Medical Record Number: [id number] Patient Account Number: [id number] Date of Birth/Sex: Treating RN: 05-10-1940 (81 y.o. Cheryl Cheryl, [name] Primary Care Jivan Symanski: Claris Gower Other Clinician: Massie Kluver Referring Zeta Bucy: Treating Lakina Mcintire/Extender: Linton Flemings in Treatment: 38 Vital Signs Height(in): 61 Pulse(bpm): 62 Weight(lbs): 250 Blood Pressure(mmHg): 157/76 Body Mass Index(BMI): 47.2 Temperature(F): 98.1 Respiratory Rate(breaths/min): 18 [1:Photos:] [N/Cheryl:N/Cheryl] Left T Great oe N/Cheryl N/Cheryl Wound Location: Gradually Appeared N/Cheryl N/Cheryl Wounding Event: Diabetic Wound/Ulcer of the Lower N/Cheryl N/Cheryl Primary Etiology: Extremity Chronic sinus problems/congestion, N/Cheryl N/Cheryl Comorbid History: Anemia, Sleep Apnea, Congestive Heart Failure, Hypertension, Type II Diabetes, Neuropathy 02/23/2020 N/Cheryl N/Cheryl Date Acquired: 71 N/Cheryl N/Cheryl Weeks of Treatment: Open N/Cheryl N/Cheryl Wound Status: No N/Cheryl N/Cheryl Wound Recurrence: 0.1x0.3x0.5 N/Cheryl N/Cheryl Measurements L x W x D (cm) 0.024 N/Cheryl N/Cheryl Cheryl (cm) : rea 0.012 N/Cheryl N/Cheryl Volume (cm) : 99.50% N/Cheryl N/Cheryl % Reduction in Cheryl rea: 97.50% N/Cheryl N/Cheryl % Reduction in Volume: Grade 1 N/Cheryl N/Cheryl Classification: Medium N/Cheryl  N/Cheryl Exudate Cheryl mount: Serosanguineous N/Cheryl N/Cheryl Exudate Type: red, brown N/Cheryl N/Cheryl Exudate Color: Thickened N/Cheryl N/Cheryl Wound Margin: Large (67-100%) N/Cheryl N/Cheryl Granulation Cheryl mount: Pink N/Cheryl N/Cheryl Granulation Quality: Small (1-33%) N/Cheryl N/Cheryl Necrotic Cheryl mount: Fat Layer (Subcutaneous Tissue): Yes N/Cheryl N/Cheryl Exposed Structures: Small (1-33%) N/Cheryl N/Cheryl Epithelialization: Treatment Notes Electronic Signature(s) Signed: 01/08/2022 12:54:23 PM By: Massie Kluver Entered By: Massie Kluver on 01/08/2022 09:22:22 Pietrzak, [name] ([id number]) [id number] Page 5 of 9 -------------------------------------------------------------------------------- Multi-Disciplinary Care Plan Details Patient Name: Date of Service: Cheryl Cheryl, [place] Cheryl. 01/08/2022 9:00 Cheryl Cheryl Medical Record Number: [id number] Patient  Account Number: [id number] Date of Birth/Sex: Treating RN: 13-Jun-1940 (81 y.o. Cheryl Cheryl, [name] Primary Care Khylie Larmore: Claris Gower Other Clinician: Massie Kluver Referring Kourtnie Sachs: Treating Reford Olliff/Extender: Linton Flemings in Treatment: 38 Active Inactive Necrotic Tissue Nursing Diagnoses: Impaired tissue integrity related to necrotic/devitalized tissue Knowledge deficit related to management of necrotic/devitalized tissue Goals: Necrotic/devitalized tissue will be minimized in the wound bed Date Initiated: 11/06/2021 Target Resolution Date: 11/06/2021 Goal Status: Active Patient/caregiver will verbalize understanding of reason and process for debridement of necrotic tissue Date Initiated: 11/06/2021 Target Resolution Date: 11/06/2021 Goal Status: Active Interventions: Assess patient pain level pre-, during and post procedure and prior to discharge Provide education on necrotic tissue and debridement process Treatment Activities: Apply topical anesthetic as ordered : 11/06/2021 Excisional debridement : 11/06/2021 Notes: Pressure Nursing Diagnoses: Knowledge  deficit related to management of pressures ulcers Potential for impaired tissue integrity related to pressure, friction, moisture, and shear Goals: Patient/caregiver will verbalize risk factors for pressure ulcer development Date Initiated: 11/27/2021 Target Resolution Date: 11/27/2021 Goal Status: Active Patient/caregiver will verbalize understanding of pressure ulcer management Date Initiated: 11/27/2021 Target Resolution Date: 11/28/2021 Goal Status: Active Interventions: Assess potential for pressure ulcer upon admission and as needed Provide education on pressure ulcers Notes: Wound/Skin Impairment Nursing Diagnoses: Impaired tissue integrity Knowledge deficit related to ulceration/compromised skin integrity Goals: Ulcer/skin breakdown will have Cheryl volume reduction of 30% by week 4 Cheryl Cheryl, Cheryl Cheryl ([id number]) [id number] Page 6 of 9 Date Initiated: 04/17/2021 Date Inactivated: 07/03/2021 Target Resolution Date: 05/15/2021 Goal Status: Unmet Unmet Reason: comorbities Ulcer/skin breakdown will have Cheryl volume reduction of 50% by week 8 Date Initiated: 04/17/2021 Date Inactivated: 07/03/2021 Target Resolution Date: 06/12/2021 Goal Status: Met Ulcer/skin breakdown will have Cheryl volume reduction of 80% by week 12 Date Initiated: 04/17/2021 Target Resolution Date: 07/10/2021 Goal Status: Active Ulcer/skin breakdown will heal within 14 weeks Date Initiated: 04/17/2021 Target Resolution Date: 07/24/2021 Goal Status: Active Interventions: Assess patient/caregiver ability to obtain necessary supplies Assess patient/caregiver ability to perform ulcer/skin care regimen upon admission and as needed Assess ulceration(s) every visit Provide education on ulcer and skin care Notes: Electronic Signature(s) Signed: 01/08/2022 12:54:23 PM By: Massie Kluver Signed: 01/11/2022 1:46:28 PM By: Gretta Cool, BSN, RN, CWS, [name] RN, BSN Entered By: Massie Kluver on 01/08/2022  09:22:10 -------------------------------------------------------------------------------- Pain Assessment Details Patient Name: Date of Service: Cheryl Bruns, MA RY Cheryl. 01/08/2022 9:00 Cheryl Cheryl Medical Record Number: [id number] Patient Account Number: [id number] Date of Birth/Sex: Treating RN: 10/22/1940 (81 y.o. Cheryl Cheryl Primary Care Daemon Dowty: Claris Gower Other Clinician: Massie Kluver Referring Freddy Kinne: Treating Joscelyne Renville/Extender: Linton Flemings in Treatment: 38 Active Problems Location of Pain Severity and Description of Pain Patient Has Paino No Site Locations Pain Management and Medication Current Pain Management: LEILONI, SMITHERS ([id number]) [id number] Page 7 of 9 Electronic Signature(s) Signed: 01/08/2022 12:54:23 PM By: Massie Kluver Signed: 01/11/2022 1:46:28 PM By: Gretta Cool, BSN, RN, CWS, [name] RN, BSN Entered By: Massie Kluver on 01/08/2022 09:14:58 -------------------------------------------------------------------------------- Patient/Caregiver Education Details Patient Name: Date of Service: Cheryl Bruns, MA RY Cheryl. 11/17/2023andnbsp9:00 Cheryl Cheryl Medical Record Number: [id number] Patient Account Number: [id number] Date of Birth/Gender: Treating RN: 11/20/1940 (81 y.o. Cheryl Cheryl Primary Care Physician: Claris Gower Other Clinician: Massie Kluver Referring Physician: Treating Physician/Extender: Linton Flemings in Treatment: 33 Education Assessment Education Provided To: Patient Education Topics Provided Wound/Skin Impairment: Handouts: Other: continue wound care as directed Methods: Explain/Verbal Responses: State content correctly Electronic Signature(s) Signed: 01/08/2022 12:54:23 PM By: Massie Kluver  Entered By: Massie Kluver on 01/08/2022 09:36:47 -------------------------------------------------------------------------------- Wound Assessment Details Patient Name: Date of Service: Cheryl Cheryl, [place] Cheryl. 01/08/2022 9:00 Cheryl Cheryl Medical Record Number: [id number] Patient Account Number: [id number] Date of Birth/Sex: Treating RN: 1940-10-03 (81 y.o. Cheryl Cheryl Primary Care Taylin Mans: Claris Gower Other Clinician: Massie Kluver Referring Sereen Schaff: Treating Cylah Fannin/Extender: Linton Flemings in Treatment: 38 Wound Status Wound Number: 1 Primary Diabetic Wound/Ulcer of the Lower Extremity Etiology: Wound Location: Left T Great oe Wound Open Wounding Event: Gradually Appeared Status: Date Acquired: 02/23/2020 Comorbid Chronic sinus problems/congestion, Anemia, Sleep Apnea, Weeks Of Treatment: 38 History: Congestive Heart Failure, Hypertension, Type II Diabetes, Clustered Wound: No Cuddeback, Shamila Cheryl ([id number]) [id number] Page 8 of 9 Clustered Wound: No Neuropathy Photos Wound Measurements Length: (cm) 0.1 Width: (cm) 0.3 Depth: (cm) 0.5 Area: (cm) 0.024 Volume: (cm) 0.012 % Reduction in Area: 99.5% % Reduction in Volume: 97.5% Epithelialization: Small (1-33%) Tunneling: No Undermining: No Wound Description Classification: Grade 1 Wound Margin: Thickened Exudate Amount: Medium Exudate Type: Serosanguineous Exudate Color: red, brown Foul Odor After Cleansing: No Slough/Fibrino Yes Wound Bed Granulation Amount: Large (67-100%) Exposed Structure Granulation Quality: Pink Fat Layer (Subcutaneous Tissue) Exposed: Yes Necrotic Amount: Small (1-33%) Necrotic Quality: Adherent Slough Treatment Notes Wound #1 (Toe Great) Wound Laterality: Left Cleanser [place] Ancillary Kit - 15 Day Supply Discharge Instruction: Use supplies as instructed; Kit contains: (15) Saline Bullets; (15) 3x3 Gauze; 15 pr Gloves Normal Saline Discharge Instruction: Wash your hands with soap and water. Remove old dressing, discard into plastic bag and place into trash. Cleanse the wound with Normal Saline prior to applying Cheryl clean dressing using gauze  sponges, not tissues or cotton balls. Do not scrub or use excessive force. Pat dry using gauze sponges, not tissue or cotton balls. Peri-Wound Care Topical Primary Dressing SILVERCEL Antimicrobial Alginate Dressing, 1x12 (in/in) Secondary Dressing Coverlet Latex-Free Fabric Adhesive Dressings Discharge Instruction: 1.5 x 2 Secured With Compression Wrap Compression Stockings Add-Ons Gauze Non-Bordered 4x4 (in/in) Discharge Instruction: Cover with dry gauze, rolled up and placed under left great toe Electronic Signature(s) Signed: 01/08/2022 12:54:23 PM By: Massie Kluver Signed: 01/11/2022 1:46:28 PM By: Gretta Cool, BSN, RN, CWS, [name] RN, BSN Entered By: Massie Kluver on 01/08/2022 09:21:15 Sorn, [name] ([id number]) [id number] Page 9 of 9 -------------------------------------------------------------------------------- Vitals Details Patient Name: Date of Service: Cheryl Cheryl, [place] RY Cheryl. 01/08/2022 9:00 Cheryl Cheryl Medical Record Number: [id number] Patient Account Number: [id number] Date of Birth/Sex: Treating RN: [birth year] (81 y.o. Cheryl Cheryl, [name] Primary Care Zaydenn Balaguer: Claris Gower Other Clinician: Massie Kluver Referring Imanni Burdine: Treating Maya Arcand/Extender: Linton Flemings in Treatment: 38 Vital Signs Time Taken: 09:12 Temperature (F): 98.1 Height (in): 61 Pulse (bpm): 87 Weight (lbs): 250 Respiratory Rate (breaths/min): 18 Body Mass Index (BMI): 47.2 Blood Pressure (mmHg): 157/76 Reference Range: 80 - 120 mg / dl Electronic Signature(s) Signed: 01/08/2022 12:54:23 PM By: Massie Kluver Entered By: Massie Kluver on 01/08/2022 09:14:47

## 2022-01-12 ENCOUNTER — Other Ambulatory Visit: Payer: Self-pay

## 2022-01-12 ENCOUNTER — Other Ambulatory Visit (HOSPITAL_COMMUNITY): Payer: Medicare Other

## 2022-01-12 ENCOUNTER — Encounter (HOSPITAL_COMMUNITY): Payer: Self-pay

## 2022-01-12 DIAGNOSIS — Z952 Presence of prosthetic heart valve: Secondary | ICD-10-CM

## 2022-01-12 NOTE — Progress Notes (Signed)
Verified appointment "no show" status with B. [place] at 11:32.

## 2022-01-22 ENCOUNTER — Other Ambulatory Visit: Payer: Self-pay

## 2022-01-22 ENCOUNTER — Inpatient Hospital Stay
Admission: EM | Admit: 2022-01-22 | Discharge: 2022-01-25 | DRG: 616 | Disposition: A | Discharge status: Home / Self Care | Payer: Medicare Other | Source: Ambulatory Visit | Attending: Internal Medicine | Admitting: Internal Medicine

## 2022-01-22 ENCOUNTER — Other Ambulatory Visit
Admission: RE | Admit: 2022-01-22 | Discharge: 2022-01-22 | Disposition: A | Discharge status: Home / Self Care | Payer: Medicare Other | Source: Ambulatory Visit | Attending: Physician Assistant | Admitting: Physician Assistant

## 2022-01-22 ENCOUNTER — Encounter: Payer: Medicare Other | Attending: Physician Assistant | Admitting: Physician Assistant

## 2022-01-22 ENCOUNTER — Emergency Department: Payer: Medicare Other

## 2022-01-22 DIAGNOSIS — I110 Hypertensive heart disease with heart failure: Secondary | ICD-10-CM | POA: Diagnosis present

## 2022-01-22 DIAGNOSIS — Z7982 Long term (current) use of aspirin: Secondary | ICD-10-CM

## 2022-01-22 DIAGNOSIS — E11621 Type 2 diabetes mellitus with foot ulcer: Secondary | ICD-10-CM | POA: Insufficient documentation

## 2022-01-22 DIAGNOSIS — E1169 Type 2 diabetes mellitus with other specified complication: Secondary | ICD-10-CM | POA: Diagnosis not present

## 2022-01-22 DIAGNOSIS — Z87442 Personal history of urinary calculi: Secondary | ICD-10-CM

## 2022-01-22 DIAGNOSIS — Z79899 Other long term (current) drug therapy: Secondary | ICD-10-CM | POA: Insufficient documentation

## 2022-01-22 DIAGNOSIS — Z87891 Personal history of nicotine dependence: Secondary | ICD-10-CM

## 2022-01-22 DIAGNOSIS — M1990 Unspecified osteoarthritis, unspecified site: Secondary | ICD-10-CM | POA: Diagnosis present

## 2022-01-22 DIAGNOSIS — Z8249 Family history of ischemic heart disease and other diseases of the circulatory system: Secondary | ICD-10-CM

## 2022-01-22 DIAGNOSIS — L97822 Non-pressure chronic ulcer of other part of left lower leg with fat layer exposed: Secondary | ICD-10-CM | POA: Insufficient documentation

## 2022-01-22 DIAGNOSIS — E669 Obesity, unspecified: Secondary | ICD-10-CM | POA: Insufficient documentation

## 2022-01-22 DIAGNOSIS — L03116 Cellulitis of left lower limb: Secondary | ICD-10-CM | POA: Diagnosis present

## 2022-01-22 DIAGNOSIS — I5033 Acute on chronic diastolic (congestive) heart failure: Secondary | ICD-10-CM | POA: Diagnosis not present

## 2022-01-22 DIAGNOSIS — Z89411 Acquired absence of right great toe: Secondary | ICD-10-CM

## 2022-01-22 DIAGNOSIS — L97526 Non-pressure chronic ulcer of other part of left foot with bone involvement without evidence of necrosis: Secondary | ICD-10-CM | POA: Diagnosis present

## 2022-01-22 DIAGNOSIS — I5023 Acute on chronic systolic (congestive) heart failure: Secondary | ICD-10-CM | POA: Diagnosis present

## 2022-01-22 DIAGNOSIS — Z953 Presence of xenogenic heart valve: Secondary | ICD-10-CM

## 2022-01-22 DIAGNOSIS — E1140 Type 2 diabetes mellitus with diabetic neuropathy, unspecified: Secondary | ICD-10-CM | POA: Insufficient documentation

## 2022-01-22 DIAGNOSIS — Z801 Family history of malignant neoplasm of trachea, bronchus and lung: Secondary | ICD-10-CM

## 2022-01-22 DIAGNOSIS — Z885 Allergy status to narcotic agent status: Secondary | ICD-10-CM

## 2022-01-22 DIAGNOSIS — L97522 Non-pressure chronic ulcer of other part of left foot with fat layer exposed: Secondary | ICD-10-CM | POA: Diagnosis not present

## 2022-01-22 DIAGNOSIS — Z882 Allergy status to sulfonamides status: Secondary | ICD-10-CM

## 2022-01-22 DIAGNOSIS — Z833 Family history of diabetes mellitus: Secondary | ICD-10-CM

## 2022-01-22 DIAGNOSIS — Z880 Allergy status to penicillin: Secondary | ICD-10-CM

## 2022-01-22 DIAGNOSIS — G4730 Sleep apnea, unspecified: Secondary | ICD-10-CM | POA: Diagnosis present

## 2022-01-22 DIAGNOSIS — Z89421 Acquired absence of other right toe(s): Secondary | ICD-10-CM | POA: Diagnosis not present

## 2022-01-22 DIAGNOSIS — M797 Fibromyalgia: Secondary | ICD-10-CM | POA: Diagnosis present

## 2022-01-22 DIAGNOSIS — K760 Fatty (change of) liver, not elsewhere classified: Secondary | ICD-10-CM | POA: Diagnosis present

## 2022-01-22 DIAGNOSIS — Z6841 Body Mass Index (BMI) 40.0 and over, adult: Secondary | ICD-10-CM | POA: Diagnosis not present

## 2022-01-22 DIAGNOSIS — Z7984 Long term (current) use of oral hypoglycemic drugs: Secondary | ICD-10-CM

## 2022-01-22 DIAGNOSIS — I509 Heart failure, unspecified: Secondary | ICD-10-CM | POA: Diagnosis not present

## 2022-01-22 DIAGNOSIS — E119 Type 2 diabetes mellitus without complications: Secondary | ICD-10-CM | POA: Diagnosis not present

## 2022-01-22 DIAGNOSIS — M86672 Other chronic osteomyelitis, left ankle and foot: Secondary | ICD-10-CM | POA: Diagnosis not present

## 2022-01-22 DIAGNOSIS — Z8711 Personal history of peptic ulcer disease: Secondary | ICD-10-CM

## 2022-01-22 DIAGNOSIS — L409 Psoriasis, unspecified: Secondary | ICD-10-CM | POA: Diagnosis present

## 2022-01-22 DIAGNOSIS — M869 Osteomyelitis, unspecified: Secondary | ICD-10-CM | POA: Diagnosis present

## 2022-01-22 DIAGNOSIS — E785 Hyperlipidemia, unspecified: Secondary | ICD-10-CM | POA: Diagnosis present

## 2022-01-22 DIAGNOSIS — I10 Essential (primary) hypertension: Secondary | ICD-10-CM | POA: Diagnosis present

## 2022-01-22 DIAGNOSIS — Z952 Presence of prosthetic heart valve: Secondary | ICD-10-CM

## 2022-01-22 LAB — LACTIC ACID, PLASMA: Lactic Acid, Venous: 1.2 mmol/L (ref 0.5–1.9)

## 2022-01-22 LAB — CBC WITH DIFFERENTIAL/PLATELET
Abs Immature Granulocytes: 0.05 10*3/uL (ref 0.00–0.07)
Basophils Absolute: 0 10*3/uL (ref 0.0–0.1)
Basophils Relative: 0 %
Eosinophils Absolute: 0.3 10*3/uL (ref 0.0–0.5)
Eosinophils Relative: 3 %
HCT: 35.8 % — ABNORMAL LOW (ref 36.0–46.0)
Hemoglobin: 11.1 g/dL — ABNORMAL LOW (ref 12.0–15.0)
Immature Granulocytes: 1 %
Lymphocytes Relative: 30 %
Lymphs Abs: 3.1 10*3/uL (ref 0.7–4.0)
MCH: 28.1 pg (ref 26.0–34.0)
MCHC: 31 g/dL (ref 30.0–36.0)
MCV: 90.6 fL (ref 80.0–100.0)
Monocytes Absolute: 0.6 10*3/uL (ref 0.1–1.0)
Monocytes Relative: 5 %
Neutro Abs: 6.4 10*3/uL (ref 1.7–7.7)
Neutrophils Relative %: 61 %
Platelets: 174 10*3/uL (ref 150–400)
RBC: 3.95 MIL/uL (ref 3.87–5.11)
RDW: 14.6 % (ref 11.5–15.5)
WBC: 10.5 10*3/uL (ref 4.0–10.5)
nRBC: 0 % (ref 0.0–0.2)

## 2022-01-22 LAB — BASIC METABOLIC PANEL
Anion gap: 9 (ref 5–15)
BUN: 28 mg/dL — ABNORMAL HIGH (ref 8–23)
CO2: 25 mmol/L (ref 22–32)
Calcium: 9.1 mg/dL (ref 8.9–10.3)
Chloride: 106 mmol/L (ref 98–111)
Creatinine, Ser: 1.25 mg/dL — ABNORMAL HIGH (ref 0.44–1.00)
GFR, Estimated: 44 mL/min — ABNORMAL LOW (ref >60)
Glucose, Bld: 157 mg/dL — ABNORMAL HIGH (ref 70–99)
Potassium: 4.5 mmol/L (ref 3.5–5.1)
Sodium: 140 mmol/L (ref 135–145)

## 2022-01-22 LAB — CBG MONITORING, ED
Glucose-Capillary: 203 mg/dL — ABNORMAL HIGH (ref 70–99)
Glucose-Capillary: 207 mg/dL — ABNORMAL HIGH (ref 70–99)

## 2022-01-22 MED ORDER — VANCOMYCIN HCL IN DEXTROSE 1-5 GM/200ML-% IV SOLN
1000.0000 mg | Freq: Once | INTRAVENOUS | Status: DC
  Filled 2022-01-22: qty 200

## 2022-01-22 MED ORDER — METRONIDAZOLE 500 MG/100ML IV SOLN
500.0000 mg | Freq: Once | INTRAVENOUS | Status: AC
  Administered 2022-01-22: 500 mg via INTRAVENOUS
  Filled 2022-01-22: qty 100

## 2022-01-22 MED ORDER — SODIUM CHLORIDE 0.9 % IV SOLN
2.0000 g | INTRAVENOUS | Status: DC
  Administered 2022-01-22 – 2022-01-23 (×2): 2 g via INTRAVENOUS
  Filled 2022-01-22 (×2): qty 20

## 2022-01-22 MED ORDER — LORATADINE 10 MG PO TABS
10.0000 mg | ORAL_TABLET | Freq: Every day | ORAL | Status: DC
  Administered 2022-01-22 – 2022-01-25 (×4): 10 mg via ORAL
  Filled 2022-01-22 (×4): qty 1

## 2022-01-22 MED ORDER — ACETAMINOPHEN 650 MG RE SUPP: 650.0000 mg | Freq: Four times a day (QID) | RECTAL | Status: DC | PRN

## 2022-01-22 MED ORDER — FUROSEMIDE 10 MG/ML IJ SOLN
40.0000 mg | Freq: Two times a day (BID) | INTRAMUSCULAR | Status: DC
  Administered 2022-01-22 – 2022-01-23 (×2): 40 mg via INTRAVENOUS
  Filled 2022-01-22 (×2): qty 4

## 2022-01-22 MED ORDER — LOPERAMIDE HCL 2 MG PO CAPS
2.0000 mg | ORAL_CAPSULE | ORAL | Status: DC | PRN
  Administered 2022-01-23: 2 mg via ORAL
  Filled 2022-01-22: qty 1

## 2022-01-22 MED ORDER — ENOXAPARIN SODIUM 60 MG/0.6ML IJ SOSY
0.5000 mg/kg | PREFILLED_SYRINGE | INTRAMUSCULAR | Status: DC
  Administered 2022-01-22 – 2022-01-24 (×3): 60 mg via SUBCUTANEOUS
  Filled 2022-01-22 (×3): qty 0.6

## 2022-01-22 MED ORDER — ONDANSETRON HCL 4 MG PO TABS: 4.0000 mg | ORAL_TABLET | Freq: Four times a day (QID) | ORAL | Status: DC | PRN

## 2022-01-22 MED ORDER — SENNOSIDES-DOCUSATE SODIUM 8.6-50 MG PO TABS: 1.0000 | ORAL_TABLET | Freq: Every evening | ORAL | Status: DC | PRN

## 2022-01-22 MED ORDER — VITAMIN D 25 MCG (1000 UNIT) PO TABS
5000.0000 [IU] | ORAL_TABLET | Freq: Every day | ORAL | Status: DC
  Administered 2022-01-23 – 2022-01-25 (×3): 5000 [IU] via ORAL
  Filled 2022-01-22 (×3): qty 5

## 2022-01-22 MED ORDER — BISACODYL 5 MG PO TBEC: 5.0000 mg | DELAYED_RELEASE_TABLET | Freq: Every day | ORAL | Status: DC | PRN

## 2022-01-22 MED ORDER — ONDANSETRON HCL 4 MG/2ML IJ SOLN
4.0000 mg | Freq: Four times a day (QID) | INTRAMUSCULAR | Status: DC | PRN
  Filled 2022-01-22: qty 2

## 2022-01-22 MED ORDER — SODIUM CHLORIDE 0.9 % IV SOLN
2.0000 g | Freq: Once | INTRAVENOUS | Status: DC
  Administered 2022-01-22: 2 g via INTRAVENOUS
  Filled 2022-01-22: qty 12.5

## 2022-01-22 MED ORDER — INSULIN ASPART 100 UNIT/ML IJ SOLN
0.0000 [IU] | Freq: Three times a day (TID) | INTRAMUSCULAR | Status: DC
  Administered 2022-01-22: 3 [IU] via SUBCUTANEOUS
  Administered 2022-01-23: 2 [IU] via SUBCUTANEOUS
  Administered 2022-01-24: 3 [IU] via SUBCUTANEOUS
  Administered 2022-01-24 (×2): 2 [IU] via SUBCUTANEOUS
  Administered 2022-01-25: 1 [IU] via SUBCUTANEOUS
  Filled 2022-01-22 (×7): qty 1

## 2022-01-22 MED ORDER — HYDRALAZINE HCL 25 MG PO TABS: 25.0000 mg | ORAL_TABLET | Freq: Four times a day (QID) | ORAL | Status: DC | PRN

## 2022-01-22 MED ORDER — ACETAMINOPHEN 325 MG PO TABS: 650.0000 mg | ORAL_TABLET | Freq: Four times a day (QID) | ORAL | Status: DC | PRN

## 2022-01-22 NOTE — Assessment & Plan Note (Addendum)
Hold enalapril for now. PRN oral hydralazine. Holding off diuresis with Cr rising.

## 2022-01-22 NOTE — Assessment & Plan Note (Signed)
Per med history, not taking statin. Check fasting lipid panel with AM labs.

## 2022-01-22 NOTE — Consult Note (Signed)
  Subjective:  Patient ID: [name], female    DOB: 07/16/1940,  MRN: [id number]  A 81 y.o. female with medical history significant of chronic HFpEF, severe aortic stenosis status post TAVR, hypertension, NAFLD, type 2 diabetes, morbid obesity, prior right foot osteomyelitis, fibromyalgia who presented to the ED from the wound care center for concerns of cellulitis related to a chronic nonhealing left toe ulcer and osteomyelitis.  Patient states that she is doing okay.  Having some shortness of breath.  Denies any fever or chills.  X-rays show osteomyelitis Objective:   Vitals:   01/22/22 1500 01/22/22 1501  BP: (!) 170/98   Pulse: 72   Resp:  18  Temp:    SpO2: 96% 99%   General AA&O x3. Normal mood and affect.  Vascular Dorsalis pedis and posterior tibial pulses 2/4 bilat. Brisk capillary refill to all digits. Pedal hair present.  Neurologic Epicritic sensation grossly intact.  Dermatologic Left hallux wound with fat layer exposed probing down to deep tissue/bone.  No purulent drainage noted mild redness noted.  Orthopedic: MMT 5/5 in dorsiflexion, plantarflexion, inversion, and eversion. Normal joint ROM without pain or crepitus.    Assessment & Plan:  Patient was evaluated and treated and all questions answered.  Left hallux wound probing down to bone with underlying osteomyelitis -All questions and concerns were discussed with the patient in extensive detail. -N.p.o. after midnight -Patient will benefit from surgical amputation of the great toe with primary closure.  I discussed this with patient extensive detail she states understanding like to proceed with -ABIs are within normal limits -Local wound care with Betadine wet-to-dry -Weightbearing as tolerated in surgical shoe  [name], DPM  Accessible via secure chat for questions or concerns.

## 2022-01-22 NOTE — ED Notes (Signed)
Pt up to bedside toilet. Pt given bathwipes. Pt providing self with peri care.

## 2022-01-22 NOTE — Assessment & Plan Note (Signed)
Poorly controlled with A1c 9.2% Hold metformin - resume at discharge. Covered with sliding scale Novolog. PCP follow up.

## 2022-01-22 NOTE — ED Provider Notes (Signed)
[facility] Provider Note    Event Date/Time   First MD Initiated Contact with Patient 01/22/22 1032     (approximate)   History   Wound Infection   HPI  [name] is a 81 y.o. female with a history of CHF, diabetes hypertension who presents with cellulitis.  Patient was sent over from wound care center for concerns of cellulitis to the left lower leg.  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     Physical Exam   Triage Vital Signs: ED Triage Vitals  Enc Vitals Group     BP 01/22/22 1018 (!) 158/119     Pulse Rate 01/22/22 1018 73     Resp 01/22/22 1018 20     Temp 01/22/22 1018 98.3 F (36.8 C)     Temp Source 01/22/22 1018 Oral     SpO2 01/22/22 1018 99 %     Weight 01/22/22 1016 119.1 kg (262 lb 9.1 oz)     Height 01/22/22 1016 1.575 m ('5\' 2"'$ )     Head Circumference --      Peak Flow --      Pain Score 01/22/22 1016 8     Pain Loc --      Pain Edu? --      Excl. in [place]? --     Most recent vital signs: Vitals:   01/22/22 1018  BP: (!) 158/119  Pulse: 73  Resp: 20  Temp: 98.3 F (36.8 C)  SpO2: 99%     General: Awake, no distress.  CV:  Good peripheral perfusion.  Resp:  Normal effort.  Abd:  No distention.  Other:  Left lower extremity: Erythema/cellulitis extending from the great toe to nearly the knee.  No purulent discharge.   ED Results / Procedures / Treatments   Labs (all labs ordered are listed, but only abnormal results are displayed) Labs Reviewed  BASIC METABOLIC PANEL - Abnormal; Notable for the following components:      Result Value   Glucose, Bld 157 (*)    BUN 28 (*)    Creatinine, Ser 1.25 (*)    GFR, Estimated 44 (*)    All other components within normal limits  CBC WITH DIFFERENTIAL/PLATELET - Abnormal; Notable for the following components:   Hemoglobin 11.1 (*)    HCT 35.8 (*)    All other components within normal limits  CULTURE, BLOOD  (ROUTINE X 2)  LACTIC ACID, PLASMA     EKG     RADIOLOGY Great toe x-ray    PROCEDURES:  Critical Care performed:   Procedures   MEDICATIONS ORDERED IN ED: Medications  ceFEPIme (MAXIPIME) 2 g in sodium chloride 0.9 % 100 mL IVPB (has no administration in time range)  metroNIDAZOLE (FLAGYL) IVPB 500 mg (has no administration in time range)  vancomycin (VANCOCIN) IVPB 1000 mg/200 mL premix (has no administration in time range)     IMPRESSION / MDM / ASSESSMENT AND PLAN / ED COURSE  I reviewed the triage vital signs and the nursing notes. Patient's presentation is most consistent with acute presentation with potential threat to life or bodily function.  Patient presents with rash/erythema as above.  She has diabetes.  Exam is consistent with significant cellulitis extending up the left lower leg.  Lab work reviewed and is overall reassuring, normal lactic acid.  Not consistent with sepsis.  X-ray does not appear consistent with osteomyelitis.  Will require  IV antibiotics, will consult hospitalist for admission        FINAL CLINICAL IMPRESSION(S) / ED DIAGNOSES   Final diagnoses:  Cellulitis of left lower extremity     Rx / DC Orders   ED Discharge Orders     None        Note:  This document was prepared using Dragon voice recognition software and may include unintentional dictation errors.   Lavonia Drafts, MD 01/22/22 [phone]

## 2022-01-22 NOTE — H&P (Signed)
History and Physical    Patient: [name] MVH:[id number] DOB: 08-Jul-1940 DOA: 01/22/2022 DOS: the patient was seen and examined on 01/22/2022 PCP: Leonard Downing, MD  Patient coming from: Home  Chief Complaint:  Chief Complaint  Patient presents with   Wound Infection   HPI: [name] is a 81 y.o. female with medical history significant of chronic HFpEF, severe aortic stenosis status post TAVR, hypertension, NAFLD, type 2 diabetes, morbid obesity, prior right foot osteomyelitis, fibromyalgia who presented to the ED from the wound care center for concerns of cellulitis related to a chronic nonhealing left toe ulcer.  Patient reports overall feeling okay and denies fevers or chills.  She does report increased lower extremity edema and shortness of breath recently worsening.  Her diuretic regimen was recently reduced.  She admits to not following daily weights at home like she should. Denies any other recent illness, nausea vomiting or diarrhea, dysuria urinary frequency, unilateral numbness weakness tingling, headaches or other symptoms or complaints.  ED course: Afebrile, normal heart rate respirations, BP elevated 150/119, O2 sat 99% on room air.  Labs notable for hemoglobin 11.1, normal lactic acid 1.2, BUN 28 and creatinine 1.25 (baseline around 0.9-1.0).  X-ray of the left great toe showed soft tissue swelling fracture fragmentation of the first distal phalanx, suspected osteomyelitis.  Patient admitted to hospitalist service and started on IV antibiotics for cellulitis podiatry consulted.  Review of Systems: As mentioned in the history of present illness. All other systems reviewed and are negative.   Past Medical History:  Diagnosis Date   Abnormal thyroid biopsy 2018   results were negative.   Anemia    "when I was alot younger" (12/06/2017)   Anxiety    self reported   Arthritis    "almost all over; used to cry w/it when I was in my teens" (12/06/2017)   CHF  (congestive heart failure) ([place])    Chronic back pain    "all over" (12/06/2017)   Depression    "lost my son last year to cancer; I tended to him; he lived w/me" (12/06/2017)   Diverticulosis of colon (without mention of hemorrhage)    External hemorrhoids without mention of complication    Fatty liver disease, nonalcoholic    Fibromyalgia    History of kidney stones    History of stomach ulcers 1970   Hyperlipemia    Hypertension    IBS (irritable bowel syndrome)    Morbid obesity ([place])    Psoriasis    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   26 mm Medtronic Evolut Pro transcatheter heart valve placed via percutaneous right transfemoral approach    Severe aortic stenosis    Sinus headache    Sleep apnea    "was told I do; never have had any problems w/it" (12/06/2017)   Type II diabetes mellitus ([place])    Past Surgical History:  Procedure Laterality Date   ABDOMINAL ADHESION SURGERY     "took 2 out"   ABDOMINAL HYSTERECTOMY     AMPUTATION TOE Right 02/12/2020   Procedure: Right hallux and second toe amputation;  Surgeon: Wylene Simmer, MD;  Location: [facility];  Service: Orthopedics;  Laterality: Right;   APPENDECTOMY     BACK SURGERY     BIOPSY  08/05/2020   Procedure: BIOPSY;  Surgeon: Wilford Corner, MD;  Location: [facility];  Service: Endoscopy;;   BIOPSY THYROID  2018   results were negative.   BREAST LUMPECTOMY  Left X 1   "benign"   CATARACT EXTRACTION W/ INTRAOCULAR LENS  IMPLANT, BILATERAL Bilateral    CATARACT EXTRACTION W/ INTRAOCULAR LENS IMPLANT Bilateral 2017   COLONOSCOPY WITH PROPOFOL N/A 08/05/2020   Procedure: COLONOSCOPY WITH PROPOFOL;  Surgeon: Wilford Corner, MD;  Location: [facility];  Service: Endoscopy;  Laterality: N/A;   ESOPHAGOGASTRODUODENOSCOPY (EGD) WITH PROPOFOL N/A 08/05/2020   Procedure: ESOPHAGOGASTRODUODENOSCOPY (EGD) WITH PROPOFOL;  Surgeon: Wilford Corner, MD;  Location: [facility];   Service: Endoscopy;  Laterality: N/A;   EYE SURGERY     HEMORRHOID BANDING     LAPAROSCOPIC CHOLECYSTECTOMY     LEFT AND RIGHT HEART CATHETERIZATION WITH CORONARY ANGIOGRAM N/A 11/15/2013   Procedure: LEFT AND RIGHT HEART CATHETERIZATION WITH CORONARY ANGIOGRAM;  Surgeon: Blane Ohara, MD;  Location: [facility] CATH LAB;  Service: Cardiovascular;  Laterality: N/A;   LUMBAR DISC SURGERY     POLYPECTOMY  08/05/2020   Procedure: POLYPECTOMY;  Surgeon: Wilford Corner, MD;  Location: [facility];  Service: Endoscopy;;   RETINAL DETACHMENT SURGERY Right    RIGHT/LEFT HEART CATH AND CORONARY ANGIOGRAPHY N/A 11/02/2017   Procedure: RIGHT/LEFT HEART CATH AND CORONARY ANGIOGRAPHY;  Surgeon: Burnell Blanks, MD;  Location: [facility];  Service: Cardiovascular;  Laterality: N/A;   TEE WITHOUT CARDIOVERSION N/A 12/06/2017   Procedure: TRANSESOPHAGEAL ECHOCARDIOGRAM (TEE);  Surgeon: [name], MD;  Location: [place];  Service: Open Heart Surgery;  Laterality: N/A;   TONSILLECTOMY     TRANSCATHETER AORTIC VALVE REPLACEMENT, TRANSFEMORAL  12/06/2017   TRANSCATHETER AORTIC VALVE REPLACEMENT, TRANSFEMORAL N/A 12/06/2017   Procedure: TRANSCATHETER AORTIC VALVE REPLACEMENT, TRANSFEMORAL;  Surgeon: [name], MD;  Location: [place];  Service: Open Heart Surgery;  Laterality: N/A;   TRANSESOPHAGEAL ECHOCARDIOGRAM  12/06/2017   Social History:  reports that she quit smoking about 21 years ago. Her smoking use included cigarettes. She has a 56.00 pack-year smoking history. She has never used smokeless tobacco. She reports that she does not drink alcohol and does not use drugs.  Allergies  Allergen Reactions   Castor Oil     "passed out" ? SYNCOPE ?   Sulfonamide Derivatives     Other Reaction(s): Not available   Amoxicillin Diarrhea and Other (See Comments)    Has patient had a PCN reaction causing immediate rash, facial/tongue/throat swelling, SOB or lightheadedness with hypotension:  No Has patient had a PCN reaction causing severe rash involving mucus membranes or skin necrosis: No Has patient had a PCN reaction that required hospitalization: No Has patient had a PCN reaction occurring within the last 10 years: No If all of the above answers are "NO", then may proceed with Cephalosporin use.    Demerol [Meperidine] Nausea And Vomiting   Hydrocodone Nausea And Vomiting    Family History  Problem Relation Age of Onset   Lung cancer Mother    Diabetes Maternal Grandmother    Heart disease Maternal Grandfather    Colon cancer Neg Hx     Prior to Admission medications   Medication Sig Start Date End Date Taking? Authorizing Provider  amoxicillin (AMOXIL) 500 MG tablet Take 4 tablets by mouth one hour prior to dental visits. 01/06/22   [name], MD  aspirin EC 81 MG tablet Take 1 tablet (81 mg total) by mouth daily. 12/08/17   Eileen Stanford, PA-C  atorvastatin (LIPITOR) 40 MG tablet Take 40 mg by mouth daily.    [provider]  Cholecalciferol (VITAMIN D-3) 5000 units TABS Take 5,000 Units  by mouth daily.    [provider]  Cranberry 250 MG TABS Take 250 mg by mouth daily.    [provider]  [employer] GENTLE LAXATIVE 5 MG EC tablet See admin instructions. 05/28/20   [provider]  cyclobenzaprine (FLEXERIL) 10 MG tablet Take 10 mg by mouth 3 (three) times daily as needed for muscle spasms.    [provider]  enalapril (VASOTEC) 10 MG tablet Take 10 mg by mouth daily.    [provider]  furosemide (LASIX) 80 MG tablet Take 1 tablet (80 mg total) by mouth daily. Patient taking differently: Take 80 mg by mouth 2 (two) times daily. 12/14/17 01/28/21  Eileen Stanford, PA-C  GAVILYTE-G 236 g solution Take by mouth as directed. 05/28/20   [provider]  glipiZIDE (GLUCOTROL XL) 10 MG 24 hr tablet Take 10 mg by mouth daily. 02/07/20   [provider]  loratadine (CLARITIN) 10 MG tablet Take  10 mg by mouth daily as needed. 08/14/20   [provider]  metFORMIN (GLUCOPHAGE) 1000 MG tablet Take 1,000 mg by mouth 2 (two) times daily. 05/05/20   [provider]  omeprazole (PRILOSEC) 40 MG capsule Take 1 capsule by mouth daily. 02/09/20   [provider]  phentermine (ADIPEX-P) 37.5 MG tablet Take 37.5 mg by mouth daily. 04/21/20   [provider]  potassium chloride SA (K-DUR,KLOR-CON) 20 MEQ tablet Take 2 tablets (40 mEq total) by mouth daily. Patient taking differently: Take 40 mEq by mouth 2 (two) times daily. 12/14/17 01/28/21  Eileen Stanford, PA-C  traMADol (ULTRAM) 50 MG tablet Take 50 mg by mouth every 6 (six) hours as needed for moderate pain.  Patient not taking: Reported on 01/28/2021    [provider]    Physical Exam: Vitals:   01/22/22 1300 01/22/22 1330 01/22/22 1400 01/22/22 1415  BP:      Pulse: 74 90  77  Resp:  '20 17 19  '$ Temp:      TempSrc:      SpO2: 100% 100% 98% 96%  Weight:      Height:       General exam: awake, alert, no acute distress, obese HEENT: atraumatic, clear conjunctiva, anicteric sclera, moist mucus membranes, hearing grossly normal  Respiratory system: CTAB diminished due to body habitus, no wheezes, rales or rhonchi, normal respiratory effort. Cardiovascular system: normal S1/S2, RRR, unable to visualize JVD, tense edema of bilteralal lower extremities from feet to below knees   Gastrointestinal system: soft, obese abdomen, non-tender Central nervous system: A&O x4. no gross focal neurologic deficits, normal speech Extremities: tense bi-pedal and distal LE edema, left lower extremity with dark erythema warm to palpation  Psychiatry: normal mood, congruent affect, judgement and insight appear normal   Data Reviewed:  Notable labs -- Cr 1.25 (baseline around 0.9-1.0), Hbg 11.1, BUN 28, lactic acid normal 1.2  Assessment and Plan: * Cellulitis of left lower extremity Due to chronic  non-healing left great toe ulcer.  Sent to ED from wound care clinic. --Started on Rocephin --Wrap legs to clear edema --Diuresis as outlined --Podiatry consulted given xray suspicious for osteomyelitis and history of right foot osteo  Acute on chronic heart failure with preserved ejection fraction (HFpEF) ([place]) Follows with Dr. Burt Knack, [facility] cardiology in [place]. Last Echo Dec 2022 showed EF 55-60%, grade II diastolic dysfunction.  Pt presents with evidence of volume overload with tense b/l LE edema, more dyspneic than baseline. --IV Lasix 40 mg BID --  Daily weights & strict I/O's --Monitor renal function, electrolytes  Non-insulin dependent type 2 diabetes mellitus ([place]) Hold metformin. Sliding scale Novolog. Check Hbg A1c.  Fibromyalgia No acute issues.  Hyperlipemia Per med history, not taking statin. Check fasting lipid panel with AM labs.  Hypertension Hold enalapril for now. PRN oral hydralazine. Also on IV Lasix for diuresis.  Morbid obesity ([place]) Body mass index is 48.02 kg/m. Complicates overall care and prognosis.  Recommend lifestyle modifications including physical activity and diet for weight loss and overall long-term health.   NAFLD (nonalcoholic fatty liver disease) No acute issues.      Advance Care Planning: Full Code  Consults: None  Family Communication: None  Severity of Illness: The appropriate patient status for this patient is OBSERVATION. Observation status is judged to be reasonable and necessary in order to provide the required intensity of service to ensure the patient's safety. The patient's presenting symptoms, physical exam findings, and initial radiographic and laboratory data in the context of their medical condition is felt to place them at decreased risk for further clinical deterioration. Furthermore, it is anticipated that the patient will be medically stable for discharge from the hospital within 2 midnights of admission.    Author: Ezekiel Slocumb, DO 01/22/2022 2:46 PM  For on call review [email or web address].

## 2022-01-22 NOTE — Progress Notes (Signed)
Rosenboom, [name] ([id number]) [id number] Page 1 of 9 Visit Report for 12/25/2021 Arrival Information Details Patient Name: Date of Service: [name], Cheryl [place] [name]. 12/25/2021 9:00 [name] M Medical Record Number: [id number] Patient Account Number: [id number] Date of Birth/Sex: Treating [name]: 03/26/1940 (81 y.o. [name], [name] Primary Care [name]: Claris Gower Other Clinician: Massie Kluver Referring Karstyn Birkey: Treating [name]/Extender: [name] in Treatment: 36 Visit Information History Since Last Visit All ordered tests and consults were completed: No Patient Arrived: [name] Added or deleted any medications: No Arrival Time: 09:10 Any new allergies or adverse reactions: No Transfer Assistance: None Had [name] fall or experienced change in No Patient Requires Transmission-Based Precautions: No activities of daily living that may affect Patient Has Alerts: No risk of falls: Signs or symptoms of abuse/neglect since last visito No Hospitalized since last visit: No Implantable device outside of the clinic excluding No cellular tissue based products placed in the center since last visit: Pain Present Now: No Electronic Signature(s) Signed: 12/29/2021 5:15:48 PM By: Massie Kluver Entered By: Massie Kluver on 12/25/2021 09:13:34 -------------------------------------------------------------------------------- Clinic Level of Care Assessment Details Patient Name: Date of Service: [name], [name]. 12/25/2021 9:00 [name] M Medical Record Number: [id number] Patient Account Number: [id number] Date of Birth/Sex: Treating [name]: 03-04-1940 (81 y.o. [name] Primary Care [name]: Claris Gower Other Clinician: Massie Kluver Referring Kenza Munar: Treating [name]/Extender: [name] in Treatment: 36 Clinic Level of Care Assessment Items TOOL 1 Quantity Score _0  - 0 Use when EandM and Procedure is performed on INITIAL  visit ASSESSMENTS - Nursing Assessment / Reassessment _1  - 0 General Physical Exam (combine w/ comprehensive assessment (listed just below) when performed on new pt. evals) _2  - 0 Comprehensive Assessment (HX, ROS, Risk Assessments, Wounds Hx, etc.) [name] ([id number]) [id number] Page 2 of 9 ASSESSMENTS - Wound and Skin Assessment / Reassessment _3  - 0 Dermatologic / Skin Assessment (not related to wound area) ASSESSMENTS - Ostomy and/or Continence Assessment and Care _4  - 0 Incontinence Assessment and Management _5  - 0 Ostomy Care Assessment and Management (repouching, etc.) PROCESS - Coordination of Care _6  - 0 Simple Patient / Family Education for ongoing care _7  - 0 Complex (extensive) Patient / Family Education for ongoing care _8  - 0 Staff obtains Programmer, systems, Records, T Results / Process Orders est _9  - 0 Staff telephones HHA, Nursing Homes / Clarify orders / etc _10  - 0 Routine Transfer to another Facility (non-emergent condition) _11  - 0 Routine Hospital Admission (non-emergent condition) _12  - 0 New Admissions / Biomedical engineer / Ordering NPWT Apligraf, etc. , _13  - 0 Emergency Hospital Admission (emergent condition) PROCESS - Special Needs _14  - 0 Pediatric / Minor Patient Management _15  - 0 Isolation Patient Management _16  - 0 Hearing / Language / Visual special needs _17  - 0 Assessment of Community assistance (transportation, D/C planning, etc.) _18  - 0 Additional assistance / Altered mentation _19  - 0 Support Surface(s) Assessment (bed, cushion, seat, etc.) INTERVENTIONS - Miscellaneous _20  - 0 External ear exam _21  - 0 Patient Transfer (multiple staff / Civil Service fast streamer / Similar devices) _22  - 0 Simple Staple / Suture removal (25 or less) _23  - 0 Complex Staple / Suture removal (26 or more) _24  - 0 Hypo/Hyperglycemic Management (do not check if billed separately) _25  - 0 Ankle / Brachial Index (ABI) - do not check if billed  separately Has the patient been seen at the hospital within the last three years: Yes Total Score:  0 Level Of Care: ____ Electronic Signature(s) Signed: 12/29/2021 5:15:48 PM By: Massie Kluver Entered By: Massie Kluver on 12/25/2021 10:28:43 -------------------------------------------------------------------------------- Complex / Palliative Patient Assessment Details Patient Name: Date of Service: Cheryl Bruns, MA RY [name]. 12/25/2021 9:00 [name] M Medical Record Number: [id number] Patient Account Number: [id number] Date of Birth/Sex: Treating [name]: 1940-11-17 (81 y.o. [name] Primary Care [name]: Claris Gower Other Clinician: Massie Kluver Referring [name]: Treating [name]/Extender: [name] in Treatment: [address] [name] ([id number]) [id number].pdf Page 3 of 9 Complex Wound Management Criteria Patient has remarkable or complex co-morbidities requiring medications or treatments that extend wound healing times. Examples: Diabetes mellitus with chronic renal failure or end stage renal disease requiring dialysis Advanced or poorly controlled rheumatoid arthritis Diabetes mellitus and end stage chronic obstructive pulmonary disease Active cancer with current chemo- or radiation therapy DM, previous amputations Palliative Wound Management Criteria Care Approach Wound Care Plan: Complex Wound Management Electronic Signature(s) Signed: 12/28/2021 12:38:06 PM By: Gretta Cool, BSN, [name], [name], [name], [name] Signed: 01/21/2022 5:46:53 PM By: [name] Entered By: Gretta Cool, BSN, [name], [name], [name] on 12/28/2021 12:38:06 -------------------------------------------------------------------------------- Encounter Discharge Information Details Patient Name: Date of Service: Cheryl Bruns, MA RY [name]. 12/25/2021 9:00 [name] M Medical Record Number: [id number] Patient Account Number: [id number] Date of Birth/Sex: Treating [name]: Dec 22, 1940 (81 y.o. [name] Primary  Care Rakin Lemelle: Claris Gower Other Clinician: Massie Kluver Referring Aleyza Salmi: Treating Viktoria Gruetzmacher/Extender: [name] in Treatment: 36 Encounter Discharge Information Items Post Procedure Vitals Discharge Condition: Stable Temperature (F): 97.7 Ambulatory Status: Cane Pulse (bpm): 72 Discharge Destination: Home Respiratory Rate (breaths/min): 18 Transportation: Other Blood Pressure (mmHg): 163/76 Accompanied By: self Schedule Follow-up Appointment: Yes Clinical Summary of Care: Electronic Signature(s) Signed: 12/29/2021 5:15:48 PM By: Massie Kluver Entered By: Massie Kluver on 12/25/2021 12:32:57 -------------------------------------------------------------------------------- Lower Extremity Assessment Details Patient Name: Date of Service: [name], [name]. 12/25/2021 9:00 [name] M Medical Record Number: [id number] Patient Account Number: [id number] Date of Birth/Sex: Treating [name]: [birth year] (81 y.o. [name] Primary Care Ritha Sampedro: Claris Gower Other Clinician: Massie Kluver [place], [name] ([id number]) [phone].pdf Page 4 of 9 Referring Jaileen Janelle: Treating Zaccheus Edmister/Extender: [name] in Treatment: 36 Edema Assessment Assessed: [Left: No] [Right: No] Edema: [Left: Ye] [Right: s] Calf Left: Right: Point of Measurement: 30 cm From Medial Instep 37 cm Ankle Left: Right: Point of Measurement: 10 cm From Medial Instep 24.8 cm Vascular Assessment Pulses: Dorsalis Pedis Palpable: [Left:Yes] Electronic Signature(s) Signed: 12/27/2021 5:57:01 PM By: Gretta Cool, BSN, [name], [name], [name], [name] Signed: 12/29/2021 5:15:48 PM By: Massie Kluver Entered By: Massie Kluver on 12/25/2021 09:25:40 -------------------------------------------------------------------------------- Multi Wound Chart Details Patient Name: Date of Service: Cheryl Bruns, MA RY [name]. 12/25/2021 9:00 [name] M Medical Record Number: [id number] Patient  Account Number: [id number] Date of Birth/Sex: Treating [name]: Feb 07, 1941 (81 y.o. 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 Etiology: Extremity Chronic sinus problems/congestion, N/[name] N/[name] Comorbid History: BALJIT, LIEBERT ([id number]) [id number] Page 5 of 9 Anemia, Sleep Apnea, Congestive Heart Failure, Hypertension, Type II Diabetes, Neuropathy 02/23/2020 N/[name] N/[name] Date Acquired: 34 N/[name] N/[name] Weeks of Treatment: Open  N/[name] N/[name] Wound Status: No N/[name] N/[name] Wound Recurrence: 0.2x0.1x0.3 N/[name] N/[name] Measurements L x W x D (cm) 0.016 N/[name] N/[name] [name] (cm) : rea 0.005 N/[name] N/[name] Volume (cm) : 99.70% N/[name] N/[name] % Reduction in [name] rea: 98.90% N/[name] N/[name] % Reduction in Volume: Grade 1 N/[name] N/[name] Classification: Medium N/[name] N/[name] Exudate [name] mount: Serosanguineous N/[name] N/[name] Exudate Type: red, brown N/[name] N/[name] Exudate Color: Thickened N/[name] N/[name] Wound Margin: Large (67-100%) N/[name] N/[name] Granulation [name] mount: Pink N/[name] N/[name] Granulation Quality: Small (1-33%) N/[name] N/[name] Necrotic [name] mount: Fat Layer (Subcutaneous Tissue): Yes N/[name] N/[name] Exposed Structures: Small (1-33%) N/[name] N/[name] Epithelialization: Treatment Notes Electronic Signature(s) Signed: 12/29/2021 5:15:48 PM By: Massie Kluver Entered By: Massie Kluver on 12/25/2021 09:26:03 -------------------------------------------------------------------------------- Multi-Disciplinary Care Plan Details Patient Name: Date of Service: Cheryl Bruns, MA RY [name]. 12/25/2021 9:00 [name] M Medical Record  Number: [id number] Patient Account Number: [id number] Date of Birth/Sex: Treating [name]: 20-Dec-1940 (81 y.o. [name], [name] Primary Care Rafaela Dinius: Claris Gower Other Clinician: Massie Kluver Referring Archer Vise: Treating Lyndal Reggio/Extender: [name] in Treatment: 36 Active Inactive Necrotic Tissue Nursing Diagnoses: Impaired tissue integrity related to necrotic/devitalized tissue Knowledge deficit related to management of necrotic/devitalized tissue Goals: Necrotic/devitalized tissue will be minimized in the wound bed Date Initiated: 11/06/2021 Target Resolution Date: 11/06/2021 Goal Status: Active Patient/caregiver will verbalize understanding of reason and process for debridement of necrotic tissue Date Initiated: 11/06/2021 Target Resolution Date: 11/06/2021 Goal Status: Active Interventions: Assess patient pain level pre-, during and post procedure and prior to discharge Provide education on necrotic tissue and debridement process Treatment Activities: Apply topical anesthetic as ordered : 11/06/2021 Excisional debridement : 11/06/2021 Leger, [name] ([id number]) [id number] Page 6 of 9 Notes: Pressure Nursing Diagnoses: Knowledge deficit related to management of pressures ulcers Potential for impaired tissue integrity related to pressure, friction, moisture, and shear Goals: Patient/caregiver will verbalize risk factors for pressure ulcer development Date Initiated: 11/27/2021 Target Resolution Date: 11/27/2021 Goal Status: Active Patient/caregiver will verbalize understanding of pressure ulcer management Date Initiated: 11/27/2021 Target Resolution Date: 11/28/2021 Goal Status: Active Interventions: Assess potential for pressure ulcer upon admission and as needed Provide education on pressure ulcers Notes: Wound/Skin Impairment Nursing Diagnoses: Impaired tissue integrity Knowledge deficit related to ulceration/compromised  skin integrity Goals: Ulcer/skin breakdown will have [name] volume reduction of 30% by week 4 Date Initiated: 04/17/2021 Date Inactivated: 07/03/2021 Target Resolution Date: 05/15/2021 Goal Status: Unmet Unmet Reason: comorbities Ulcer/skin breakdown will have [name] volume reduction of 50% by week 8 Date Initiated: 04/17/2021 Date Inactivated: 07/03/2021 Target Resolution Date: 06/12/2021 Goal Status: Met Ulcer/skin breakdown will have [name] volume reduction of 80% by week 12 Date Initiated: 04/17/2021 Target Resolution Date: 07/10/2021 Goal Status: Active Ulcer/skin breakdown will heal within 14 weeks Date Initiated: 04/17/2021 Target Resolution Date: 07/24/2021 Goal Status: Active Interventions: Assess patient/caregiver ability to obtain necessary supplies Assess patient/caregiver ability to perform ulcer/skin care regimen upon admission and as needed Assess ulceration(s) every visit Provide education on ulcer and skin care Notes: Electronic Signature(s) Signed: 12/27/2021 5:57:01 PM By: Gretta Cool, BSN, [name], [name], [name], [name] Signed: 12/29/2021 5:15:48 PM By: Massie Kluver Entered By: Massie Kluver on 12/25/2021 09:25:50 -------------------------------------------------------------------------------- Pain Assessment Details Patient Name: Date of Service: [name], Zaleski RY [name]. 12/25/2021 9:00 [name] M Medical Record Number: [id number] Patient Account Number: [id number] Date of Birth/Sex: Treating [name]: 1941-01-21 (81 y.o. [name] Primary Care Hope Holst: Claris Gower Other Clinician: Massie Kluver [place], [name] ([id number]) [phone].pdf Page 7 of 9 Referring Elvenia Godden: Treating Kaven Cumbie/Extender:  Stone, Marlow Baars, Redmond Pulling Weeks in Treatment: 36 Active Problems Location of Pain Severity and Description of Pain Patient Has Paino No Site Locations Pain Management and Medication Current Pain Management: Electronic Signature(s) Signed: 12/27/2021 5:57:01 PM By: Gretta Cool, BSN, [name],  [name], [name], [name] Signed: 12/29/2021 5:15:48 PM By: Massie Kluver Entered By: Massie Kluver on 12/25/2021 09:16:35 -------------------------------------------------------------------------------- Patient/Caregiver Education Details Patient Name: Date of Service: Cheryl Bruns, MA RY [name]. 11/3/2023andnbsp9:00 [name] M Medical Record Number: [id number] Patient Account Number: [id number] Date of Birth/Gender: Treating [name]: 07-23-1940 (81 y.o. [name] Primary Care Physician: Claris Gower Other Clinician: Massie Kluver Referring Physician: Treating Physician/Extender: [name] in Treatment: 36 Education Assessment Education Provided To: Patient Education Topics Provided Wound/Skin Impairment: Handouts: Other: continue wound care as directed Methods: Explain/Verbal Responses: State content correctly Electronic Signature(s) Signed: 12/29/2021 5:15:48 PM By: Massie Kluver Cleary, [name] ([id number]) [id number] Page 8 of 9 Entered By: Massie Kluver on 12/25/2021 10:38:29 -------------------------------------------------------------------------------- Wound Assessment Details Patient Name: Date of Service: [name], Cheryl RY [name]. 12/25/2021 9:00 [name] M Medical Record Number: [id number] Patient Account Number: [id number] Date of Birth/Sex: Treating [name]: [birth year] (81 y.o. [name], [name] Primary Care Iain Sawchuk: Claris Gower Other Clinician: Massie Kluver Referring Jasmon Mattice: Treating Viridiana Spaid/Extender: [name] in Treatment: 36 Wound Status Wound Number: 1 Primary Diabetic Wound/Ulcer of the Lower Extremity Etiology: Wound Location: Left T Great oe Wound Open Wounding Event: Gradually Appeared Status: Date Acquired: 02/23/2020 Comorbid Chronic sinus problems/congestion, Anemia, Sleep Apnea, Weeks Of Treatment: 36 History: Congestive Heart Failure, Hypertension, Type II Diabetes, Clustered Wound: No  Neuropathy Photos Wound Measurements Length: (cm) 0.2 Width: (cm) 0.1 Depth: (cm) 0.3 Area: (cm) 0.016 Volume: (cm) 0.005 % Reduction in Area: 99.7% % Reduction in Volume: 98.9% Epithelialization: Small (1-33%) Tunneling: No Undermining: No Wound Description Classification: Grade 1 Wound Margin: Thickened Exudate Amount: Medium Exudate Type: Serosanguineous Exudate Color: red, brown Foul Odor After Cleansing: No Slough/Fibrino Yes Wound Bed Granulation Amount: Large (67-100%) Exposed Structure Granulation Quality: Pink Fat Layer (Subcutaneous Tissue) Exposed: Yes Necrotic Amount: Small (1-33%) Necrotic Quality: Adherent Therapist, music) Signed: 12/27/2021 5:57:01 PM By: Gretta Cool, BSN, [name], [name], [name], [name] Signed: 12/29/2021 5:15:48 PM By: Massie Kluver Entered By: Massie Kluver on 12/25/2021 09:23:57 Couts, [name] ([id number]) [id number] Page 9 of 9 -------------------------------------------------------------------------------- Vitals Details Patient Name: Date of Service: [name], Cheryl RY [name]. 12/25/2021 9:00 [name] M Medical Record Number: [id number] Patient Account Number: [id number] Date of Birth/Sex: Treating [name]: [birth year] (81 y.o. [name], [name] Primary Care Ysidra Sopher: Claris Gower Other Clinician: Massie Kluver Referring Alzina Golda: Treating Nga Rabon/Extender: [name] in Treatment: 36 Vital Signs Time Taken: 09:13 Temperature (F): 97.7 Height (in): 61 Pulse (bpm): 72 Weight (lbs): 250 Respiratory Rate (breaths/min): 18 Body Mass Index (BMI): 47.2 Blood Pressure (mmHg): 163/76 Reference Range: 80 - 120 mg / dl Electronic Signature(s) Signed: 12/29/2021 5:15:48 PM By: Massie Kluver Entered By: Massie Kluver on 12/25/2021 09:16:29

## 2022-01-22 NOTE — ED Notes (Signed)
Antibiotic started at new IV.

## 2022-01-22 NOTE — Assessment & Plan Note (Signed)
No acute issues.

## 2022-01-22 NOTE — Progress Notes (Addendum)
[name], [name] ([id number]) [id number] Page 1 of 8 Visit Report for 01/22/2022 Arrival Information Details Patient Name: Date of Service: [name], [name] 01/22/2022 9:00 A M Medical Record Number: [id number] Patient Account Number: [id number] Date of Birth/Sex: Treating RN: 1940/06/13 (81 y.o. [name], [name] Primary Care [name]: [name] Other Clinician: Massie [name] Referring [name]: Treating [name]/Extender: [name] in Treatment: 59 Visit Information History Since Last Visit All ordered tests and consults were completed: No Patient Arrived: [name] Added or deleted any medications: No Arrival Time: 09:08 Any new allergies or adverse reactions: No Transfer Assistance: None Had a fall or experienced change in No Patient Identification Verified: Yes activities of daily living that may affect Secondary Verification Process Completed: Yes risk of falls: Patient Requires Transmission-Based Precautions: No Signs or symptoms of abuse/neglect since last visito No Patient Has Alerts: No Hospitalized since last visit: No Implantable device outside of the clinic excluding No cellular tissue based products placed in the center since last visit: Has Dressing in Place as Prescribed: Yes Has Compression in Place as Prescribed: No Pain Present Now: No Notes Patient has hard time putting compression stockings on Electronic Signature(s) Signed: 01/25/2022 4:50:46 PM By: [name] Entered By: [name] on 01/22/2022 09:13:16 -------------------------------------------------------------------------------- Clinic Level of Care Assessment Details Patient Name: Date of Service: [name], Cheryl A. 01/22/2022 9:00 A M Medical Record Number: [id number] Patient Account Number: [id number] Date of Birth/Sex: Treating RN: 1940/04/12 (81 y.o. [name] Primary Care [name]: [name] Other Clinician: Massie [name] Referring [name]: Treating [name]/Extender: [name] in Treatment: 40 Clinic Level of Care Assessment Items TOOL 1 Quantity Score _0  - 0 Use when EandM and Procedure is performed on INITIAL visit Sheets, KEIRSTYN A ([id number]) [phone] Page 2 of 8 ASSESSMENTS - Nursing Assessment / Reassessment _1  - 0 General Physical Exam (combine w/ comprehensive assessment (listed just below) when performed on new pt. evals) _2  - 0 Comprehensive Assessment (HX, ROS, Risk Assessments, Wounds Hx, etc.) ASSESSMENTS - Wound and Skin Assessment / Reassessment _3  - 0 Dermatologic / Skin Assessment (not related to wound area) ASSESSMENTS - Ostomy and/or Continence Assessment and Care _4  - 0 Incontinence Assessment and Management _5  - 0 Ostomy Care Assessment and Management (repouching, etc.) PROCESS - Coordination of Care _6  - 0 Simple Patient / Family Education for ongoing care _7  - 0 Complex (extensive) Patient / Family Education for ongoing care _8  - 0 Staff obtains Programmer, systems, Records, T Results / Process Orders est _9  - 0 Staff telephones HHA, Nursing Homes / Clarify orders / etc _10  - 0 Routine Transfer to another Facility (non-emergent condition) _11  - 0 Routine Hospital Admission (non-emergent condition) _12  - 0 New Admissions / Biomedical engineer / Ordering NPWT Apligraf, etc. , _13  - 0 Emergency Hospital Admission (emergent condition) PROCESS - Special Needs _14  - 0 Pediatric / Minor Patient Management _15  - 0 Isolation Patient Management _16  - 0 Hearing / Language / Visual special needs _17  - 0 Assessment of Community assistance (transportation, D/C planning, etc.) _18  - 0 Additional assistance / Altered mentation _19  - 0 Support Surface(s) Assessment (bed, cushion, seat, etc.) INTERVENTIONS - Miscellaneous _20  - 0 External ear exam _21  - 0 Patient Transfer (multiple staff / Civil Service fast streamer / Similar devices) _22  -  0 Simple Staple / Suture removal (25 or less) _23  - 0 Complex Staple / Suture removal (26 or more) _24  - 0 Hypo/Hyperglycemic Management (do not check if billed  separately) _0  - 0 Ankle / Brachial Index (ABI) - do not check if billed separately Has the patient been seen at the hospital within the last three years: Yes Total Score: 0 Level Of Care: ____ Electronic Signature(s) Signed: 01/25/2022 4:50:46 PM By: [name] Entered By: [name] on 01/22/2022 09:48:33 -------------------------------------------------------------------------------- Encounter Discharge Information Details Patient Name: Date of Service: [name], [place] A. 01/22/2022 9:00 [place] Record Number: [id number] Patient Account Number: [id number] [name] ([id number]) [phone].pdf Page 3 of 8 Date of Birth/Sex: Treating RN: 06-08-1940 (82 y.o. [name] Primary Care Dae Highley: Other Clinician: Arnette Felts Referring Brittan Butterbaugh: Treating Kyleena Scheirer/Extender: [name] in Treatment: [phone] Encounter Discharge Information Items Post Procedure Vitals Discharge Condition: Stable Temperature (F): 98.2 Ambulatory Status: Cane Pulse (bpm): 66 Discharge Destination: Hospital Respiratory Rate (breaths/min): 18 Telephoned: Yes Blood Pressure (mmHg): 167/82 Orders Sent: Yes Transportation: Other Accompanied By: self Schedule Follow-up Appointment: Yes Clinical Summary of Care: Electronic Signature(s) Signed: 01/25/2022 4:50:46 PM By: [name] Entered By: [name] on 01/22/2022 09:56:41 -------------------------------------------------------------------------------- Lower Extremity Assessment Details Patient Name: Date of Service: [name], Cheryl A. 01/22/2022 9:00 A M Medical Record Number: [id number] Patient Account Number: [id number] Date of Birth/Sex: Treating RN: [birth year] (82 y.o. [name] Primary Care Shanon Seawright:  [name] Other Clinician: Massie [name] Referring Sanjna Haskew: Treating Sharanda Shinault/Extender: [name] in Treatment: 40 Edema Assessment Assessed: [Left: Yes] [Right: No] Edema: [Left: Ye] [Right: s] Calf Left: Right: Point of Measurement: 30 cm From Medial Instep 42.1 cm Ankle Left: Right: Point of Measurement: 10 cm From Medial Instep 28.1 cm Vascular Assessment Pulses: Dorsalis Pedis Palpable: [Left:Yes] Notes faint pedal pulse in left foot Electronic Signature(s) Signed: 01/22/2022 2:41:51 PM By: Gretta Cool, BSN, RN, CWS, [name] RN, BSN Signed: 01/25/2022 4:50:46 PM By: [name] Entered By: [name] on 01/22/2022 09:26:33 [name], [name] ([id number]) [id number] Page 4 of 8 -------------------------------------------------------------------------------- Multi Wound Chart Details Patient Name: Date of Service: [name], [name] 01/22/2022 9:00 A M Medical Record Number: [id number] Patient Account Number: [id number] Date of Birth/Sex: Treating RN: [birth year] (81 y.o. [name], [name] Primary Care Datha Kissinger: [name] Other Clinician: Massie [name] Referring Kamerin Axford: Treating Yohanna Tow/Extender: [name] in Treatment: 2 Vital Signs Height(in): 61 Pulse(bpm): 27 Weight(lbs): 250 Blood Pressure(mmHg): 167/82 Body Mass Index(BMI): 47.2 Temperature(F): 98.2 Respiratory Rate(breaths/min): 18 [1:Photos:] [N/A:N/A] Left T Great oe N/A N/A Wound Location: Gradually Appeared N/A N/A Wounding Event: Diabetic Wound/Ulcer of the Lower N/A N/A Primary Etiology: Extremity Chronic sinus problems/congestion, N/A N/A Comorbid History: Anemia, Sleep Apnea, Congestive Heart Failure, Hypertension, Type II Diabetes, Neuropathy 02/23/2020 N/A N/A Date Acquired: 40 N/A N/A Weeks of Treatment: Open N/A N/A Wound Status: No N/A N/A Wound Recurrence: 0.3x0.2x0.3 N/A N/A Measurements L x W x D  (cm) 0.047 N/A N/A A (cm) : rea 0.014 N/A N/A Volume (cm) : 99.00% N/A N/A % Reduction in A rea: 97.00% N/A N/A % Reduction in Volume: Grade 1 N/A N/A Classification: Medium N/A N/A Exudate A mount: Serosanguineous N/A N/A Exudate Type: red, brown N/A N/A Exudate Color: Thickened N/A N/A Wound Margin: Large (67-100%) N/A N/A Granulation A mount: Pink N/A N/A Granulation Quality: Small (1-33%) N/A N/A Necrotic A mount: Fat Layer (Subcutaneous Tissue): Yes N/A N/A Exposed Structures: Small (1-33%) N/A N/A Epithelialization: Treatment Notes Electronic Signature(s) Signed: 01/25/2022 4:50:46 PM By: [name] Entered By: [name] on 01/22/2022 09:26:37 Mano, [name] ([id number]) [id number]  Page 5 of 8 -------------------------------------------------------------------------------- Multi-Disciplinary Care Plan Details Patient Name: Date of Service: [name], [name] 01/22/2022 9:00 A M Medical Record Number: [id number] Patient Account Number: [id number] Date of Birth/Sex: Treating RN: 01-14-1941 (81 y.o. [name] Primary Care Miel Wisener: [name] Other Clinician: Massie [name] Referring Cannon Arreola: Treating Joel Mericle/Extender: [name] in Treatment: 40 Active Inactive Electronic Signature(s) Signed: 03/01/2022 12:19:44 PM By: Gretta Cool, BSN, RN, CWS, [name] RN, BSN Previous Signature: 01/22/2022 2:41:51 PM Version By: Gretta Cool BSN, RN, CWS, [name] RN, BSN Previous Signature: 01/25/2022 4:50:46 PM Version By: [name] Entered By: Gretta Cool BSN, RN, CWS, [name] on 03/01/2022 12:19:43 -------------------------------------------------------------------------------- Pain Assessment Details Patient Name: Date of Service: Cheryl Bruns, Cheryl RY A. 01/22/2022 9:00 A M Medical Record Number: [id number] Patient Account Number: [id number] Date of Birth/Sex: Treating RN: 09/29/1940 (81 y.o. [name] Primary Care Morgon Pamer:  [name] Other Clinician: Massie [name] Referring Robel Wuertz: Treating Jannely Henthorn/Extender: [name] in Treatment: 40 Active Problems Location of Pain Severity and Description of Pain Patient Has Paino No Site Locations MIRABELLE, CYPHERS A ([id number]) [id number] Page 6 of 8 Pain Management and Medication Current Pain Management: Electronic Signature(s) Signed: 01/22/2022 2:41:51 PM By: Gretta Cool, BSN, RN, CWS, [name] RN, BSN Signed: 01/25/2022 4:50:46 PM By: [name] Entered By: [name] on 01/22/2022 09:17:01 -------------------------------------------------------------------------------- Patient/Caregiver Education Details Patient Name: Date of Service: Cheryl Bruns, Cheryl RY A. 12/1/2023andnbsp9:00 A M Medical Record Number: [id number] Patient Account Number: [id number] Date of Birth/Gender: Treating RN: Nov 06, 1940 (81 y.o. [name] Primary Care Physician: [name] Other Clinician: Massie [name] Referring Physician: Treating Physician/Extender: [name] in Treatment: 81 Education Assessment Education Provided To: Patient Education Topics Provided Infection: Handouts: Other: go to ER for treatment of infection Methods: Explain/Verbal Electronic Signature(s) Signed: 01/25/2022 4:50:46 PM By: [name] Entered By: [name] on 01/22/2022 09:49:14 -------------------------------------------------------------------------------- Wound Assessment Details Patient Name: Date of Service: Cheryl Bruns, Cheryl RY A. 01/22/2022 9:00 A M Medical Record Number: [id number] Patient Account Number: [id number] Date of Birth/Sex: Treating RN: 08-Sep-1940 (81 y.o. [name] Primary Care Linwood Gullikson: [name] Other Clinician: Massie [name] Referring Kacey Dysert: Treating Salima Rumer/Extender: [name] in Treatment: 40 Wound Status Wound Number: 1 Primary Diabetic Wound/Ulcer of  the Lower Extremity Etiology: Wound Location: Left T [address] [place]inoza, [place] A ([id number]) [id number] Page 7 of 8 Wound Open Wounding Event: Gradually Appeared Status: Date Acquired: 02/23/2020 Comorbid Chronic sinus problems/congestion, Anemia, Sleep Apnea, Weeks Of Treatment: 40 History: Congestive Heart Failure, Hypertension, Type II Diabetes, Clustered Wound: No Neuropathy Photos Wound Measurements Length: (cm) 0.3 Width: (cm) 0.2 Depth: (cm) 0.3 Area: (cm) 0.047 Volume: (cm) 0.014 % Reduction in Area: 99% % Reduction in Volume: 97% Epithelialization: Small (1-33%) Tunneling: No Undermining: No Wound Description Classification: Grade 1 Wound Margin: Thickened Exudate Amount: Medium Exudate Type: Serosanguineous Exudate Color: red, brown Foul Odor After Cleansing: No Slough/Fibrino Yes Wound Bed Granulation Amount: Large (67-100%) Exposed Structure Granulation Quality: Pink Fat Layer (Subcutaneous Tissue) Exposed: Yes Necrotic Amount: Small (1-33%) Necrotic Quality: Adherent Therapist, music) Signed: 01/22/2022 2:41:51 PM By: Gretta Cool, BSN, RN, CWS, [name] RN, BSN Signed: 01/25/2022 4:50:46 PM By: [name] Entered By: [name] on 01/22/2022 09:25:25 -------------------------------------------------------------------------------- Vitals Details Patient Name: Date of Service: Cheryl Bruns, Cheryl RY A. 01/22/2022 9:00 A M Medical Record Number: [id number] Patient Account Number: [id number] Date of Birth/Sex: Treating RN: 18-Jul-1940 (81 y.o. [name] Primary Care Jette Lewan: [name] Other  Clinician: Massie [name] Referring Cailan General: Treating Pate Aylward/Extender: [name] in Treatment: 40 Vital Signs Time Taken: 09:13 Temperature (F): 98.2 Height (in): 61 Pulse (bpm): 66 Weight (lbs): 250 Respiratory Rate (breaths/min): 18 Body Mass Index (BMI): 47.2 Blood Pressure (mmHg):  167/82 [place], [name] ([id number]) [phone].pdf Page 8 of 8 Reference Range: 80 - 120 mg / dl Electronic Signature(s) Signed: 01/25/2022 4:50:46 PM By: [name] Entered By: [name] on 01/22/2022 09:16:57

## 2022-01-22 NOTE — Assessment & Plan Note (Signed)
Body mass index is 48.02 kg/m. Complicates overall care and prognosis.  Recommend lifestyle modifications including physical activity and diet for weight loss and overall long-term health.

## 2022-01-22 NOTE — ED Notes (Signed)
Attending to bedside. Pt's L lower leg marked with boarder around red, swollen, hot area. Pt reports has been fighting wound chronically with her PCP.

## 2022-01-22 NOTE — ED Notes (Signed)
All previous blood-work collected by another staff member.

## 2022-01-22 NOTE — Assessment & Plan Note (Signed)
Due to chronic non-healing left great toe ulcer.  Sent to ED from wound care clinic. --Started on Rocephin --Wrap legs to clear edema --Diuresis as outlined --Podiatry consulted given xray suspicious for osteomyelitis and history of right foot osteo

## 2022-01-22 NOTE — ED Triage Notes (Signed)
Pt here from the [facility] after a follow up today. Pt has cellulitis extending from her great toe on her left foot all the way up to below here knee and was sent here for IV abx per the report. Pt c/o having mild pain in that foot.

## 2022-01-22 NOTE — Code Documentation (Signed)
CODE SEPSIS - PHARMACY COMMUNICATION  **Broad Spectrum Antibiotics should be administered within 1 hour of Sepsis diagnosis**  Time Code Sepsis Called/Page Received: 1044  Antibiotics Ordered: cefepime, vancomycin  Time of 1st antibiotic administration: 1118  Darrick Penna ,PharmD Clinical Pharmacist  01/22/2022  11:21 AM

## 2022-01-22 NOTE — ED Notes (Signed)
Pt received dinner tray. Sean checking BG now. Pt requesting something for diarrhea; this RN has not seen pt have episodes of diarrhea yet today but notified attending at pt's request.

## 2022-01-22 NOTE — Progress Notes (Addendum)
PHARMACIST - PHYSICIAN COMMUNICATION  CONCERNING:  Enoxaparin (Lovenox) for DVT Prophylaxis    RECOMMENDATION: Patient was prescribed enoxaprin '40mg'$  q24 hours for VTE prophylaxis.   Filed Weights   01/22/22 1016  Weight: 119.1 kg (262 lb 9.1 oz)    Body mass index is 48.02 kg/m.  Estimated Creatinine Clearance: 44.8 mL/min (A) (by C-G formula based on SCr of 1.25 mg/dL (H)).   Based on [place] patient is candidate for enoxaparin 0.'5mg'$ /kg TBW SQ every 24 hours based on BMI being >30.  DESCRIPTION: Pharmacy has adjusted enoxaparin dose per [facility] policy.  Patient is now receiving enoxaparin 60 mg every 24 hours    Lorin Picket, PharmD Clinical Pharmacist  01/22/2022 11:21 AM

## 2022-01-22 NOTE — Consult Note (Addendum)
PHARMACY -  BRIEF ANTIBIOTIC NOTE   Pharmacy has received consult(s) for Cefepime and Vancomycin from an ED provider.  The patient's profile has been reviewed for ht/wt/allergies/indication/available labs.    Discussed empiric coverage with hospitalist and empiric antibiotics were discontinued thereafter. Hospitalist ordered ceftriaxone x7d for cellultis.  Further antibiotics/pharmacy consults should be ordered by admitting physician if indicated.                       Thank you, Darrick Penna 01/22/2022  11:16 AM

## 2022-01-22 NOTE — Progress Notes (Signed)
Hadlock, [name] ([id number]) [id number] Page 1 of 2 Visit Report for 01/22/2022 Chief Complaint Document Details Patient Name: Date of Service: [name], [name] 01/22/2022 9:00 A M Medical Record Number: [id number] Patient Account Number: [id number] Date of Birth/Sex: Treating RN: 1940/06/17 (81 y.o. [name] Primary Care Provider: Claris Gower Other Clinician: Massie Kluver Referring Provider: Treating Provider/Extender: Linton Flemings in Treatment: 75 Information Obtained from: Patient Chief Complaint Multiple toe ulcersations Electronic Signature(s) Signed: 01/22/2022 8:37:22 AM By: Worthy Keeler PA-C Entered By: Worthy Keeler on 01/22/2022 08:37:22 -------------------------------------------------------------------------------- Problem List Details Patient Name: Date of Service: Cheryl Bruns, Cheryl RY A. 01/22/2022 9:00 A M Medical Record Number: [id number] Patient Account Number: [id number] Date of Birth/Sex: Treating RN: Jun 07, 1940 (81 y.o. 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

## 2022-01-22 NOTE — Assessment & Plan Note (Signed)
Follows with Dr. Burt Knack, [facility] cardiology in [place]. Last Echo Dec 2022 showed EF 55-60%, grade II diastolic dysfunction.  Pt presents with evidence of volume overload with tense b/l LE edema, more dyspneic than baseline. --Treated with IV Lasix x 2 doses, stopped with rise in Cr --Hold diuretics for today --Daily weights & strict I/O's --Monitor renal function, electrolytes

## 2022-01-22 NOTE — ED Notes (Signed)
Pt declined offer of pain med. Pt's sheets adjusted as requested as stated she was cold. Pt appreciative.

## 2022-01-23 ENCOUNTER — Inpatient Hospital Stay: Payer: Medicare Other

## 2022-01-23 ENCOUNTER — Encounter: Payer: Self-pay | Admitting: Internal Medicine

## 2022-01-23 ENCOUNTER — Observation Stay: Payer: Medicare Other | Admitting: Certified Registered"

## 2022-01-23 ENCOUNTER — Other Ambulatory Visit: Payer: Self-pay

## 2022-01-23 ENCOUNTER — Encounter
Admission: EM | Disposition: A | Discharge status: Home / Self Care | Payer: Self-pay | Source: Ambulatory Visit | Attending: Internal Medicine

## 2022-01-23 DIAGNOSIS — E785 Hyperlipidemia, unspecified: Secondary | ICD-10-CM | POA: Diagnosis present

## 2022-01-23 DIAGNOSIS — E11621 Type 2 diabetes mellitus with foot ulcer: Secondary | ICD-10-CM | POA: Diagnosis present

## 2022-01-23 DIAGNOSIS — M86672 Other chronic osteomyelitis, left ankle and foot: Secondary | ICD-10-CM | POA: Diagnosis not present

## 2022-01-23 DIAGNOSIS — M1990 Unspecified osteoarthritis, unspecified site: Secondary | ICD-10-CM | POA: Diagnosis present

## 2022-01-23 DIAGNOSIS — Z7984 Long term (current) use of oral hypoglycemic drugs: Secondary | ICD-10-CM | POA: Diagnosis not present

## 2022-01-23 DIAGNOSIS — Z89411 Acquired absence of right great toe: Secondary | ICD-10-CM | POA: Diagnosis not present

## 2022-01-23 DIAGNOSIS — Z8711 Personal history of peptic ulcer disease: Secondary | ICD-10-CM | POA: Diagnosis not present

## 2022-01-23 DIAGNOSIS — E1169 Type 2 diabetes mellitus with other specified complication: Secondary | ICD-10-CM | POA: Diagnosis present

## 2022-01-23 DIAGNOSIS — L03116 Cellulitis of left lower limb: Secondary | ICD-10-CM | POA: Diagnosis present

## 2022-01-23 DIAGNOSIS — G4730 Sleep apnea, unspecified: Secondary | ICD-10-CM | POA: Diagnosis present

## 2022-01-23 DIAGNOSIS — Z801 Family history of malignant neoplasm of trachea, bronchus and lung: Secondary | ICD-10-CM | POA: Diagnosis not present

## 2022-01-23 DIAGNOSIS — Z87891 Personal history of nicotine dependence: Secondary | ICD-10-CM | POA: Diagnosis not present

## 2022-01-23 DIAGNOSIS — Z833 Family history of diabetes mellitus: Secondary | ICD-10-CM | POA: Diagnosis not present

## 2022-01-23 DIAGNOSIS — M797 Fibromyalgia: Secondary | ICD-10-CM | POA: Diagnosis present

## 2022-01-23 DIAGNOSIS — M869 Osteomyelitis, unspecified: Secondary | ICD-10-CM | POA: Diagnosis present

## 2022-01-23 DIAGNOSIS — Z6841 Body Mass Index (BMI) 40.0 and over, adult: Secondary | ICD-10-CM | POA: Diagnosis not present

## 2022-01-23 DIAGNOSIS — I5033 Acute on chronic diastolic (congestive) heart failure: Secondary | ICD-10-CM | POA: Diagnosis present

## 2022-01-23 DIAGNOSIS — L97526 Non-pressure chronic ulcer of other part of left foot with bone involvement without evidence of necrosis: Secondary | ICD-10-CM | POA: Diagnosis present

## 2022-01-23 DIAGNOSIS — Z8249 Family history of ischemic heart disease and other diseases of the circulatory system: Secondary | ICD-10-CM | POA: Diagnosis not present

## 2022-01-23 DIAGNOSIS — I110 Hypertensive heart disease with heart failure: Secondary | ICD-10-CM | POA: Diagnosis present

## 2022-01-23 DIAGNOSIS — L409 Psoriasis, unspecified: Secondary | ICD-10-CM | POA: Diagnosis present

## 2022-01-23 DIAGNOSIS — Z953 Presence of xenogenic heart valve: Secondary | ICD-10-CM | POA: Diagnosis not present

## 2022-01-23 DIAGNOSIS — Z87442 Personal history of urinary calculi: Secondary | ICD-10-CM | POA: Diagnosis not present

## 2022-01-23 DIAGNOSIS — K760 Fatty (change of) liver, not elsewhere classified: Secondary | ICD-10-CM | POA: Diagnosis present

## 2022-01-23 HISTORY — PX: AMPUTATION: SHX166

## 2022-01-23 LAB — CBC
HCT: 32 % — ABNORMAL LOW (ref 36.0–46.0)
Hemoglobin: 9.8 g/dL — ABNORMAL LOW (ref 12.0–15.0)
MCH: 28.2 pg (ref 26.0–34.0)
MCHC: 30.6 g/dL (ref 30.0–36.0)
MCV: 92 fL (ref 80.0–100.0)
Platelets: 143 10*3/uL — ABNORMAL LOW (ref 150–400)
RBC: 3.48 MIL/uL — ABNORMAL LOW (ref 3.87–5.11)
RDW: 14.6 % (ref 11.5–15.5)
WBC: 8.4 10*3/uL (ref 4.0–10.5)
nRBC: 0 % (ref 0.0–0.2)

## 2022-01-23 LAB — BASIC METABOLIC PANEL
Anion gap: 7 (ref 5–15)
BUN: 32 mg/dL — ABNORMAL HIGH (ref 8–23)
CO2: 26 mmol/L (ref 22–32)
Calcium: 8.5 mg/dL — ABNORMAL LOW (ref 8.9–10.3)
Chloride: 106 mmol/L (ref 98–111)
Creatinine, Ser: 1.34 mg/dL — ABNORMAL HIGH (ref 0.44–1.00)
GFR, Estimated: 40 mL/min — ABNORMAL LOW (ref >60)
Glucose, Bld: 187 mg/dL — ABNORMAL HIGH (ref 70–99)
Potassium: 4.3 mmol/L (ref 3.5–5.1)
Sodium: 139 mmol/L (ref 135–145)

## 2022-01-23 LAB — LIPID PANEL
Cholesterol: 146 mg/dL (ref 0–200)
HDL: 36 mg/dL — ABNORMAL LOW (ref >40)
LDL Cholesterol: 91 mg/dL (ref 0–99)
Total CHOL/HDL Ratio: 4.1 RATIO
Triglycerides: 93 mg/dL (ref <150)
VLDL: 19 mg/dL (ref 0–40)

## 2022-01-23 LAB — HEMOGLOBIN A1C
Hgb A1c MFr Bld: 9.2 % — ABNORMAL HIGH (ref 4.8–5.6)
Mean Plasma Glucose: 217 mg/dL

## 2022-01-23 LAB — CBG MONITORING, ED: Glucose-Capillary: 170 mg/dL — ABNORMAL HIGH (ref 70–99)

## 2022-01-23 LAB — GLUCOSE, CAPILLARY
Glucose-Capillary: 141 mg/dL — ABNORMAL HIGH (ref 70–99)
Glucose-Capillary: 126 mg/dL — ABNORMAL HIGH (ref 70–99)

## 2022-01-23 SURGERY — AMPUTATION DIGIT
Anesthesia: General | Laterality: Left

## 2022-01-23 MED ORDER — PROPOFOL 10 MG/ML IV BOLUS
INTRAVENOUS | Status: AC
  Filled 2022-01-23: qty 20

## 2022-01-23 MED ORDER — MORPHINE SULFATE (PF) 2 MG/ML IV SOLN: 2.0000 mg | INTRAVENOUS | Status: DC | PRN

## 2022-01-23 MED ORDER — OXYCODONE-ACETAMINOPHEN 5-325 MG PO TABS
1.0000 | ORAL_TABLET | ORAL | Status: DC | PRN
  Administered 2022-01-23 – 2022-01-24 (×3): 1 via ORAL
  Filled 2022-01-23 (×3): qty 1
  Filled 2022-01-23: qty 2
  Filled 2022-01-23: qty 1

## 2022-01-23 MED ORDER — FENTANYL CITRATE (PF) 100 MCG/2ML IJ SOLN: 25.0000 ug | INTRAMUSCULAR | Status: DC | PRN

## 2022-01-23 MED ORDER — PROPOFOL 10 MG/ML IV BOLUS
INTRAVENOUS | Status: DC | PRN
  Administered 2022-01-23: 40 mg via INTRAVENOUS

## 2022-01-23 MED ORDER — PROPOFOL 500 MG/50ML IV EMUL
INTRAVENOUS | Status: DC | PRN
  Administered 2022-01-23: 150 ug/kg/min via INTRAVENOUS

## 2022-01-23 MED ORDER — OXYCODONE HCL 5 MG PO TABS: 5.0000 mg | ORAL_TABLET | Freq: Once | ORAL | Status: DC | PRN

## 2022-01-23 MED ORDER — LIDOCAINE HCL 1 % IJ SOLN
INTRAMUSCULAR | Status: DC | PRN
  Administered 2022-01-23: 10 mL

## 2022-01-23 MED ORDER — LACTATED RINGERS IV SOLN: INTRAVENOUS | Status: DC | PRN

## 2022-01-23 MED ORDER — 0.9 % SODIUM CHLORIDE (POUR BTL) OPTIME
TOPICAL | Status: DC | PRN
  Administered 2022-01-23: 500 mL

## 2022-01-23 MED ORDER — FENTANYL CITRATE (PF) 100 MCG/2ML IJ SOLN
INTRAMUSCULAR | Status: AC
  Administered 2022-01-23: 25 ug via INTRAVENOUS
  Filled 2022-01-23: qty 2

## 2022-01-23 MED ORDER — OXYCODONE HCL 5 MG/5ML PO SOLN: 5.0000 mg | Freq: Once | ORAL | Status: DC | PRN

## 2022-01-23 SURGICAL SUPPLY — 33 items
BASIN KIT SINGLE STR (MISCELLANEOUS) ×1 IMPLANT
BLADE OSC/SAGITTAL MD 5.5X18 (BLADE) IMPLANT
BLADE SURG 15 STRL LF DISP TIS (BLADE) ×1 IMPLANT
BLADE SURG 15 STRL SS (BLADE) ×1
BLADE SURG MINI STRL (BLADE) ×1 IMPLANT
BNDG COHESIVE 4X5 TAN STRL LF (GAUZE/BANDAGES/DRESSINGS) ×1 IMPLANT
BNDG ELASTIC 4X5.8 VLCR STR LF (GAUZE/BANDAGES/DRESSINGS) ×1 IMPLANT
BNDG ESMARK 4X12 TAN STRL LF (GAUZE/BANDAGES/DRESSINGS) ×1 IMPLANT
BNDG GAUZE DERMACEA FLUFF 4 (GAUZE/BANDAGES/DRESSINGS) ×1 IMPLANT
CUFF TOURN SGL QUICK 18X4 (TOURNIQUET CUFF) ×1 IMPLANT
DURAPREP 26ML APPLICATOR (WOUND CARE) ×1 IMPLANT
ELECT REM PT RETURN 9FT ADLT (ELECTROSURGICAL) ×1
ELECTRODE REM PT RTRN 9FT ADLT (ELECTROSURGICAL) ×1 IMPLANT
GAUZE PAD ABD 8X10 STRL (GAUZE/BANDAGES/DRESSINGS) ×1 IMPLANT
GAUZE SPONGE 4X4 12PLY STRL (GAUZE/BANDAGES/DRESSINGS) IMPLANT
GAUZE XEROFORM 1X8 LF (GAUZE/BANDAGES/DRESSINGS) IMPLANT
GLOVE BIO SURGEON STRL SZ7 (GLOVE) ×1 IMPLANT
GLOVE SURG UNDER POLY LF SZ7.5 (GLOVE) ×1 IMPLANT
GOWN STRL REUS W/ TWL LRG LVL3 (GOWN DISPOSABLE) ×2 IMPLANT
GOWN STRL REUS W/ TWL XL LVL3 (GOWN DISPOSABLE) ×1 IMPLANT
GOWN STRL REUS W/TWL LRG LVL3 (GOWN DISPOSABLE) ×1
GOWN STRL REUS W/TWL XL LVL3 (GOWN DISPOSABLE) ×1
IV NS 1000ML (IV SOLUTION) ×1
IV NS 1000ML BAXH (IV SOLUTION) ×1 IMPLANT
KIT TURNOVER KIT A (KITS) IMPLANT
MANIFOLD NEPTUNE II (INSTRUMENTS) ×1 IMPLANT
NEEDLE HYPO 22GX1.5 SAFETY (NEEDLE) IMPLANT
NS IRRIG 500ML POUR BTL (IV SOLUTION) IMPLANT
PACK EXTREMITY ARMC (MISCELLANEOUS) ×1 IMPLANT
STOCKINETTE IMPERVIOUS 9X36 MD (GAUZE/BANDAGES/DRESSINGS) IMPLANT
SUT PROLENE 2 0 FS (SUTURE) ×1 IMPLANT
SUT PROLENE 3 0 PS 2 (SUTURE) ×1 IMPLANT
TRAP FLUID SMOKE EVACUATOR (MISCELLANEOUS) ×1 IMPLANT

## 2022-01-23 NOTE — Interval H&P Note (Signed)
History and Physical Interval Note:  01/23/2022 11:57 AM  [name]  has presented today for surgery, with the diagnosis of OSTEOMYELITIS LEFT TOE.  The various methods of treatment have been discussed with the patient and family. After consideration of risks, benefits and other options for treatment, the patient has consented to  Procedure(s): AMPUTATION DIGIT (Left) as a surgical intervention.  The patient's history has been reviewed, patient examined, no change in status, stable for surgery.  I have reviewed the patient's chart and labs.  Questions were answered to the patient's satisfaction.     [name]

## 2022-01-23 NOTE — Transfer of Care (Signed)
Immediate Anesthesia Transfer of Care Note  Patient: [name]  Procedure(s) Performed: Procedure(s): AMPUTATION DIGIT (Left)  Patient Location: PACU  Anesthesia Type:General  Level of Consciousness: sedated  Airway & Oxygen Therapy: Patient Spontanous Breathing and Patient connected to face mask oxygen  Post-op Assessment: Report given to RN and Post -op Vital signs reviewed and stable  Post vital signs: Reviewed and stable  Last Vitals:  Vitals:   01/23/22 0857 01/23/22 1242  BP: 126/65 (!) 149/64  Pulse: 74 77  Resp: 18 18  Temp: 36.6 C   SpO2: 12% 78%    Complications: No apparent anesthesia complications

## 2022-01-23 NOTE — Anesthesia Postprocedure Evaluation (Signed)
Anesthesia Post Note  Patient: [name]  Procedure(s) Performed: AMPUTATION DIGIT (Left)  Patient location during evaluation: PACU Anesthesia Type: General Level of consciousness: awake and alert Pain management: pain level controlled Vital Signs Assessment: post-procedure vital signs reviewed and stable Respiratory status: spontaneous breathing, nonlabored ventilation, respiratory function stable and patient connected to nasal cannula oxygen Cardiovascular status: blood pressure returned to baseline and stable Postop Assessment: no apparent nausea or vomiting Anesthetic complications: no   No notable events documented.   Last Vitals:  Vitals:   01/23/22 1249 01/23/22 1300  BP: (!) 149/64 (!) 149/64  Pulse: 77 78  Resp: (!) 21 (!) 23  Temp:    SpO2: 99% 98%    Last Pain:  Vitals:   01/23/22 1249  TempSrc:   PainSc: 0-No pain                 Ilene Qua

## 2022-01-23 NOTE — Progress Notes (Signed)
Progress Note   Patient: [name] DOB: April 02, 1940 DOA: 01/22/2022     0 DOS: the patient was seen and examined on 01/23/2022   Brief hospital course: [name] is a 81 y.o. female with medical history significant of chronic HFpEF, severe aortic stenosis status post TAVR, hypertension, NAFLD, type 2 diabetes, morbid obesity, prior right foot osteomyelitis, fibromyalgia who presented to the ED from the wound care center for concerns of cellulitis related to a chronic nonhealing left toe ulcer.  Patient reports overall feeling okay and denies fevers or chills.  She does report increased lower extremity edema and shortness of breath recently worsening.  Her diuretic regimen was recently reduced.  She admits to not following daily weights at home like she should. Denies any other recent illness, nausea vomiting or diarrhea, dysuria urinary frequency, unilateral numbness weakness tingling, headaches or other symptoms or complaints.   ED course: Afebrile, normal heart rate respirations, BP elevated 150/119, O2 sat 99% on room air.  Labs notable for hemoglobin 11.1, normal lactic acid 1.2, BUN 28 and creatinine 1.25 (baseline around 0.9-1.0).  X-ray of the left great toe showed soft tissue swelling fracture fragmentation of the first distal phalanx, suspected osteomyelitis.   Patient admitted to hospitalist service and started on IV antibiotics for cellulitis podiatry consulted.    12/2: underwent left great toe amputation due to osteomyelitis.  Remains on IV antibiotics.  IV Lasix stopped with Cr rising.  Assessment and Plan: * Cellulitis of left lower extremity Due to chronic non-healing left great toe ulcer complicated by Osteomyelitis.  Sent to ED from wound care clinic. --Continue IV Rocephin --Podiatry consulted given xray suspicious for osteomyelitis and history of right foot osteo --Plan is for left great toe amputation today  Acute on chronic heart failure with  preserved ejection fraction (HFpEF) ([place]) Follows with Dr. Burt Knack, [facility] cardiology in [place]. Last Echo Dec 2022 showed EF 55-60%, grade II diastolic dysfunction.  Pt presents with evidence of volume overload with tense b/l LE edema, more dyspneic than baseline. --Treated with IV Lasix x 2 doses, stopped with rise in Cr --Hold diuretics for today --Daily weights & strict I/O's --Monitor renal function, electrolytes  Non-insulin dependent type 2 diabetes mellitus ([place]) Hold metformin. Sliding scale Novolog. Check Hbg A1c.  Fibromyalgia No acute issues.  Hyperlipemia Per med history, not taking statin. Check fasting lipid panel with AM labs.  Hypertension Hold enalapril for now. PRN oral hydralazine. Holding off diuresis with Cr rising.  Morbid obesity ([place]) Body mass index is 48.02 kg/m. Complicates overall care and prognosis.  Recommend lifestyle modifications including physical activity and diet for weight loss and overall long-term health.   NAFLD (nonalcoholic fatty liver disease) No acute issues.        Subjective: Pt seen in ED this AM, still holding for a bed. Reports feeling anxious about surgery today.  Leg and feet remain pretty swollen, maybe slightly better.  No other acute complaints.    Physical Exam: Vitals:   01/22/22 1733 01/22/22 2209 01/23/22 0340 01/23/22 0857  BP:  (!) 144/55 (!) 121/105 126/65  Pulse: 71 74 70 74  Resp: 10 (!) '24 20 18  '$ Temp:  98.2 F (36.8 C) 98.1 F (36.7 C) 97.8 F (36.6 C)  TempSrc:  Oral Oral Oral  SpO2: 94% 98% 99% 97%  Weight:      Height:       General exam: awake, alert, no acute distress, obese HEENT: moist mucus membranes, hearing  grossly normal  Respiratory system: CTAB, no wheezes, rales or rhonchi, normal respiratory effort. Cardiovascular system: normal S1/S2, RRR, BLE tense edema.   Gastrointestinal system: soft, NT. Central nervous system: A&O x4. no gross focal neurologic deficits, normal  speech Extremities: left leg erythema slightly fading and receding, no longer warm to touch, foot dressing intact Psychiatry: normal mood, congruent affect, judgement and insight appear normal   Data Reviewed:  Notable labs ---  Cr 1.34 from 1.25, glucose 187, Ca 8.5, GFR 40, Hbg 9.8 from 11.1, platelets 143 from 174.  Normal lipid profile except HDL low at 36.  Family Communication: none  Disposition: Status is: Inpatient Remains inpatient appropriate because: remains on IV antibiotics for osteomyelitis pending operative cultures.        Planned Discharge Destination: Home    Time spent: 35 minutes  Author: Ezekiel Slocumb, DO 01/23/2022 12:11 PM  For on call review [email or web address].

## 2022-01-23 NOTE — Hospital Course (Signed)
[name] is a 81 y.o. female with medical history significant of chronic HFpEF, severe aortic stenosis status post TAVR, hypertension, NAFLD, type 2 diabetes, morbid obesity, prior right foot osteomyelitis, fibromyalgia who presented to the ED from the wound care center for concerns of cellulitis related to a chronic nonhealing left toe ulcer.  Patient reports overall feeling okay and denies fevers or chills.  She does report increased lower extremity edema and shortness of breath recently worsening.  Her diuretic regimen was recently reduced.  She admits to not following daily weights at home like she should. Denies any other recent illness, nausea vomiting or diarrhea, dysuria urinary frequency, unilateral numbness weakness tingling, headaches or other symptoms or complaints.   ED course: Afebrile, normal heart rate respirations, BP elevated 150/119, O2 sat 99% on room air.  Labs notable for hemoglobin 11.1, normal lactic acid 1.2, BUN 28 and creatinine 1.25 (baseline around 0.9-1.0).  X-ray of the left great toe showed soft tissue swelling fracture fragmentation of the first distal phalanx, suspected osteomyelitis.   Patient admitted to hospitalist service and started on IV antibiotics for cellulitis podiatry consulted.    12/2: underwent left great toe amputation due to osteomyelitis.  Remains on IV antibiotics.  IV Lasix stopped with Cr rising.

## 2022-01-23 NOTE — Interval H&P Note (Signed)
History and Physical Interval Note:  01/23/2022 9:55 AM  [name]  has presented today for surgery, with the diagnosis of OSTEOMYELITIS LEFT TOE.  The various methods of treatment have been discussed with the patient and family. After consideration of risks, benefits and other options for treatment, the patient has consented to  Procedure(s): AMPUTATION DIGIT (Left) hallux as a surgical intervention.  The patient's history has been reviewed, patient examined, no change in status, stable for surgery.  I have reviewed the patient's chart and labs.  Questions were answered to the patient's satisfaction.     [name]

## 2022-01-23 NOTE — Anesthesia Preprocedure Evaluation (Signed)
Anesthesia Evaluation  Patient identified by MRN, date of birth, ID band Patient awake    Reviewed: Allergy & Precautions, NPO status , Patient's Chart, lab work & pertinent test results  History of Anesthesia Complications Negative for: history of anesthetic complications  Airway Mallampati: III  TM Distance: >3 FB Neck ROM: full    Dental  (+) Chipped   Pulmonary sleep apnea , former smoker   Pulmonary exam normal        Cardiovascular hypertension, On Medications +CHF  Normal cardiovascular exam  Hx of AS s/p TAVR  IMPRESSIONS     1. Left ventricular ejection fraction, by estimation, is 55 to 60%. The  left ventricle has normal function. The apical inferoseptal wall and apex  appear hypokinetic (although views are slightly off axis). There is  moderate hypertrophy of the basal  septum. The rest of the LV segments demonstrate mild-to-moderate  concentric left ventricular hypertrophy. Left ventricular diastolic  parameters are consistent with Grade II diastolic dysfunction  (pseudonormalization). Elevated left atrial pressure.   2. Right ventricular systolic function is normal. The right ventricular  size is normal. There is mildly elevated pulmonary artery systolic  pressure. The estimated right ventricular systolic pressure is 84.1 mmHg.   3. Left atrial size was mildly dilated.   4. The mitral valve is abnormal. Trivial mitral valve regurgitation.  There is severe mitral annular calcification with thickening and  calcification of the mitral valve leaflets. No significant mitral  stenosis. MVA by VTI 2.68cm2, mean gradient 79mHg at  HR 83bpm.   5. The aortic valve has been repaired/replaced. There is a 26 mm  CoreValve-Evolut Pro prosthetic (TAVR) valve present in the aortic  position. Procedure Date: 12/06/17. Echo findings are consistent with  normal structure and function of the aortic valve  prosthesis. The mean  gradient is 711mg, Vmax 1.8 m/s, DI 0.84. There is  mild paravalvular leak best seen on apical 5C view.     Neuro/Psych  PSYCHIATRIC DISORDERS Anxiety Depression    negative neurological ROS     GI/Hepatic PUD,,,NAFLD   Endo/Other  diabetes  Morbid obesity  Renal/GU negative Renal ROS  negative genitourinary   Musculoskeletal  (+) Arthritis ,  Fibromyalgia -, narcotic dependent  Abdominal   Peds  Hematology negative hematology ROS (+)   Anesthesia Other Findings Past Medical History: 2018: Abnormal thyroid biopsy     Comment:  results were negative. No date: Anemia     Comment:  "when I was alot younger" (12/06/2017) No date: Anxiety     Comment:  self reported No date: Arthritis     Comment:  "almost all over; used to cry w/it when I was in my               teens" (12/06/2017) No date: CHF (congestive heart failure) ([place] date: Chronic back pain     Comment:  "all over" (12/06/2017) No date: Depression     Comment:  "lost my son last year to cancer; I tended to him; he               lived w/me" (12/06/2017) No date: Diverticulosis of colon (without mention of hemorrhage) No date: External hemorrhoids without mention of complication No date: Fatty liver disease, nonalcoholic No date: Fibromyalgia No date: History of kidney stones 1970: History of stomach ulcers No date: Hyperlipemia No date: Hypertension No date: IBS (irritable bowel syndrome) No date: Morbid obesity ([place] date: Psoriasis 12/06/2017: S/P TAVR (transcatheter aortic valve  replacement)     Comment:  26 mm Medtronic Evolut Pro transcatheter heart valve               placed via percutaneous right transfemoral approach  No date: Severe aortic stenosis No date: Sinus headache No date: Sleep apnea     Comment:  "was told I do; never have had any problems w/it"               (12/06/2017) No date: Type II diabetes mellitus ([place])  Past Surgical History: No date: ABDOMINAL ADHESION SURGERY      Comment:  "took 2 out" No date: ABDOMINAL HYSTERECTOMY 02/12/2020: AMPUTATION TOE; Right     Comment:  Procedure: Right hallux and second toe amputation;                Surgeon: Wylene Simmer, MD;  Location: [facility];  Service: Orthopedics;  Laterality: Right; No date: APPENDECTOMY No date: BACK SURGERY 08/05/2020: BIOPSY     Comment:  Procedure: BIOPSY;  Surgeon: Wilford Corner, MD;                Location: [facility];  Service: Endoscopy;; 2018: BIOPSY THYROID     Comment:  results were negative. X 1: BREAST LUMPECTOMY; Left     Comment:  "benign" No date: CATARACT EXTRACTION W/ INTRAOCULAR LENS  IMPLANT, BILATERAL;  Bilateral 2017: CATARACT EXTRACTION W/ INTRAOCULAR LENS IMPLANT; Bilateral 08/05/2020: COLONOSCOPY WITH PROPOFOL; N/A     Comment:  Procedure: COLONOSCOPY WITH PROPOFOL;  Surgeon:               Wilford Corner, MD;  Location: [facility];  Service:              Endoscopy;  Laterality: N/A; 08/05/2020: ESOPHAGOGASTRODUODENOSCOPY (EGD) WITH PROPOFOL; N/A     Comment:  Procedure: ESOPHAGOGASTRODUODENOSCOPY (EGD) WITH               PROPOFOL;  Surgeon: Wilford Corner, MD;  Location: [facility]               ENDOSCOPY;  Service: Endoscopy;  Laterality: N/A; No date: EYE SURGERY No date: HEMORRHOID BANDING No date: LAPAROSCOPIC CHOLECYSTECTOMY 11/15/2013: LEFT AND RIGHT HEART CATHETERIZATION WITH CORONARY  ANGIOGRAM; N/A     Comment:  Procedure: LEFT AND RIGHT HEART CATHETERIZATION WITH               CORONARY ANGIOGRAM;  Surgeon: Blane Ohara, MD;                Location: [facility] CATH LAB;  Service: Cardiovascular;                Laterality: N/A; No date: LUMBAR [place] SURGERY 08/05/2020: POLYPECTOMY     Comment:  Procedure: POLYPECTOMY;  Surgeon: Wilford Corner, MD;              Location: [facility];  Service: Endoscopy;; No date: RETINAL DETACHMENT SURGERY; Right 11/02/2017: RIGHT/LEFT HEART CATH AND CORONARY ANGIOGRAPHY; N/A      Comment:  Procedure: RIGHT/LEFT HEART CATH AND CORONARY               ANGIOGRAPHY;  Surgeon: Burnell Blanks, MD;                Location: [facility] CV LAB;  Service: Cardiovascular;                Laterality: N/A;  12/06/2017: TEE WITHOUT CARDIOVERSION; N/A     Comment:  Procedure: TRANSESOPHAGEAL ECHOCARDIOGRAM (TEE);                Surgeon: [name], MD;  Location: [place];  Service:              Open Heart Surgery;  Laterality: N/A; No date: TONSILLECTOMY 12/06/2017: TRANSCATHETER AORTIC VALVE REPLACEMENT, TRANSFEMORAL 12/06/2017: TRANSCATHETER AORTIC VALVE REPLACEMENT, TRANSFEMORAL; N/A     Comment:  Procedure: TRANSCATHETER AORTIC VALVE REPLACEMENT,               TRANSFEMORAL;  Surgeon: [name], MD;  Location:               [place];  Service: Open Heart Surgery;  Laterality: N/A; 12/06/2017: TRANSESOPHAGEAL ECHOCARDIOGRAM  BMI    Body Mass Index: 48.02 kg/m      Reproductive/Obstetrics negative OB ROS                              Anesthesia Physical Anesthesia Plan  ASA: 3  Anesthesia Plan: General   Post-op Pain Management: Ofirmev IV (intra-op)*, Dilaudid IV and Toradol IV (intra-op)*   Induction: Intravenous  PONV Risk Score and Plan: 3 and Propofol infusion and TIVA  Airway Management Planned: Natural Airway and Nasal Cannula  Additional Equipment:   Intra-op Plan:   Post-operative Plan:   Informed Consent: I have reviewed the patients History and Physical, chart, labs and discussed the procedure including the risks, benefits and alternatives for the proposed anesthesia with the patient or authorized representative who has indicated his/her understanding and acceptance.     Dental Advisory Given  Plan Discussed with: Anesthesiologist, CRNA and Surgeon  Anesthesia Plan Comments: (Patient consented for risks of anesthesia including but not limited to:  - adverse reactions to medications - risk of airway  placement if required - damage to eyes, teeth, lips or other oral mucosa - nerve damage due to positioning  - sore throat or hoarseness - Damage to heart, brain, nerves, lungs, other parts of body or loss of life  Patient voiced understanding.)         Anesthesia Quick Evaluation

## 2022-01-23 NOTE — Anesthesia Procedure Notes (Signed)
Date/Time: 01/23/2022 12:20 PM  Performed by: Doreen Salvage, CRNAPre-anesthesia Checklist: Patient identified, Emergency Drugs available, Suction available and Patient being monitored Patient Re-evaluated:Patient Re-evaluated prior to induction Oxygen Delivery Method: Simple face mask Induction Type: IV induction Dental Injury: Teeth and Oropharynx as per pre-operative assessment

## 2022-01-23 NOTE — Op Note (Signed)
Surgeon: Surgeon(s): [name], DPM  Assistants: None Pre-operative diagnosis: OSTEOMYELITIS LEFT TOE  Post-operative diagnosis: same Procedure: Procedure(s) (LRB): AMPUTATION DIGIT (Left) hallux Pathology:  ID Type Source Tests Collected by Time Destination  A :  Tissue PATH Digit amputation  [name], DPM 01/23/2022 1209     Pertinent Intra-op findings: Osteomyelitis noted to the left great toe Anesthesia: Choice  Hemostasis: * Missing tourniquet times found for documented tourniquets in log: [id number] * EBL: Minimal 20 cc Materials: 3-0 Prolene Injectables: 10 cc of half percent Marcaine plain 1% lidocaine plain Complications: None  Indications for surgery: A 81 y.o. female presents with left hallux osteomyelitis. Patient has failed all conservative therapy including but not limited to local wound care and antibiotics. She wishes to have surgical correction of the foot/deformity. It was determined that patient would benefit from left hallux amputation. Informed surgical risk consent was reviewed and read aloud to the patient.  I reviewed the films.  I have discussed my findings with the patient in great detail.  I have discussed all risks including but not limited to infection, stiffness, scarring, limp, disability, deformity, damage to blood vessels and nerves, numbness, poor healing, need for braces, arthritis, chronic pain, amputation, death.  All benefits and realistic expectations discussed in great detail.  I have made no promises as to the outcome.  I have provided realistic expectations.  I have offered the patient a 2nd opinion, which they have declined and assured me they preferred to proceed despite the risks   Procedure in detail: The patient was both verbally and visually identified by myself, the nursing staff, and anesthesia staff in the preoperative holding area. They were then transferred to the operating room and placed on the operative table in supine position.   Tourniquet was inflated 250 mmHg pressure  Attention was directed to the left hallux.  Using skin marker the incision was delineated to incorporate the distal toe.  Using #15 blade incision was carried down from epidermal junction down to the level of the bone.  At this time using sagittal saw partial hallux amputation was performed.  And the toe was disarticulated sent to pathology in standard technique.  The area appeared clear of infection.  The wound was thoroughly irrigated with normal saline solution.  The incision was primarily closed with 3-0 Prolene.  The wound was dressed with Xeroform 4 x 4 gauze Kerlix Ace bandage.  All bony prominences were adequately padded.  The tourniquet was deflated at a total time of 9 minutes.  Disposition Patient is okay to be discharged from podiatric standpoint.  She will need 10 days of doxycycline.  She will be weightbearing as tolerated with surgical shoe.  No dressing change until follow-up follow-up 1 week from discharge.  At the conclusion of the procedure the patient was awoken from anesthesia and found to have tolerated the procedure well any complications. There were transferred to PACU with vital signs stable and vascular status intact.  Boneta Lucks, DPM

## 2022-01-23 NOTE — Plan of Care (Signed)
°  Problem: Education: °Goal: Ability to demonstrate management of disease process will improve °Outcome: Progressing °Goal: Ability to verbalize understanding of medication therapies will improve °Outcome: Progressing °Goal: Individualized Educational Video(s) °Outcome: Progressing °  °

## 2022-01-24 ENCOUNTER — Encounter: Payer: Self-pay | Admitting: Podiatry

## 2022-01-24 DIAGNOSIS — M86672 Other chronic osteomyelitis, left ankle and foot: Secondary | ICD-10-CM

## 2022-01-24 DIAGNOSIS — L03116 Cellulitis of left lower limb: Secondary | ICD-10-CM | POA: Diagnosis not present

## 2022-01-24 LAB — CBC
HCT: 33.7 % — ABNORMAL LOW (ref 36.0–46.0)
Hemoglobin: 10.4 g/dL — ABNORMAL LOW (ref 12.0–15.0)
MCH: 28.1 pg (ref 26.0–34.0)
MCHC: 30.9 g/dL (ref 30.0–36.0)
MCV: 91.1 fL (ref 80.0–100.0)
Platelets: 161 10*3/uL (ref 150–400)
RBC: 3.7 MIL/uL — ABNORMAL LOW (ref 3.87–5.11)
RDW: 14.7 % (ref 11.5–15.5)
WBC: 9 10*3/uL (ref 4.0–10.5)
nRBC: 0 % (ref 0.0–0.2)

## 2022-01-24 LAB — MAGNESIUM: Magnesium: 2.3 mg/dL (ref 1.7–2.4)

## 2022-01-24 LAB — AEROBIC CULTURE W GRAM STAIN (SUPERFICIAL SPECIMEN)

## 2022-01-24 LAB — GLUCOSE, CAPILLARY
Glucose-Capillary: 190 mg/dL — ABNORMAL HIGH (ref 70–99)
Glucose-Capillary: 158 mg/dL — ABNORMAL HIGH (ref 70–99)
Glucose-Capillary: 227 mg/dL — ABNORMAL HIGH (ref 70–99)
Glucose-Capillary: 170 mg/dL — ABNORMAL HIGH (ref 70–99)
Glucose-Capillary: 252 mg/dL — ABNORMAL HIGH (ref 70–99)

## 2022-01-24 LAB — BASIC METABOLIC PANEL
Anion gap: 8 (ref 5–15)
BUN: 29 mg/dL — ABNORMAL HIGH (ref 8–23)
CO2: 24 mmol/L (ref 22–32)
Calcium: 8.7 mg/dL — ABNORMAL LOW (ref 8.9–10.3)
Chloride: 108 mmol/L (ref 98–111)
Creatinine, Ser: 1.19 mg/dL — ABNORMAL HIGH (ref 0.44–1.00)
GFR, Estimated: 47 mL/min — ABNORMAL LOW (ref >60)
Glucose, Bld: 174 mg/dL — ABNORMAL HIGH (ref 70–99)
Potassium: 4 mmol/L (ref 3.5–5.1)
Sodium: 140 mmol/L (ref 135–145)

## 2022-01-24 MED ORDER — DOXYCYCLINE HYCLATE 100 MG PO TABS
100.0000 mg | ORAL_TABLET | Freq: Two times a day (BID) | ORAL | Status: DC
  Administered 2022-01-24 – 2022-01-25 (×3): 100 mg via ORAL
  Filled 2022-01-24 (×3): qty 1

## 2022-01-24 MED ORDER — FUROSEMIDE 40 MG PO TABS
80.0000 mg | ORAL_TABLET | Freq: Every day | ORAL | Status: DC
  Administered 2022-01-24 – 2022-01-25 (×2): 80 mg via ORAL
  Filled 2022-01-24 (×2): qty 2

## 2022-01-24 NOTE — Plan of Care (Signed)
?  Problem: Activity: ?Goal: Risk for activity intolerance will decrease ?Outcome: Progressing ?  ?Problem: Nutrition: ?Goal: Adequate nutrition will be maintained ?Outcome: Progressing ?  ?Problem: Elimination: ?Goal: Will not experience complications related to urinary retention ?Outcome: Progressing ?  ?Problem: Pain Managment: ?Goal: General experience of comfort will improve ?Outcome: Progressing ?  ?

## 2022-01-24 NOTE — Progress Notes (Addendum)
Progress Note   Patient: [name] JXB:[id number] DOB: 04-21-1940 DOA: 01/22/2022     2 DOS: the patient was seen and examined on 01/26/2022   Brief hospital course: [name] is a 81 y.o. female with medical history significant of chronic HFpEF, severe aortic stenosis status post TAVR, hypertension, NAFLD, type 2 diabetes, morbid obesity, prior right foot osteomyelitis, fibromyalgia who presented to the ED from the wound care center for concerns of cellulitis related to a chronic nonhealing left toe ulcer.  Patient reports overall feeling okay and denies fevers or chills.  She does report increased lower extremity edema and shortness of breath recently worsening.  Her diuretic regimen was recently reduced.  She admits to not following daily weights at home like she should. Denies any other recent illness, nausea vomiting or diarrhea, dysuria urinary frequency, unilateral numbness weakness tingling, headaches or other symptoms or complaints.   ED course: Afebrile, normal heart rate respirations, BP elevated 150/119, O2 sat 99% on room air.  Labs notable for hemoglobin 11.1, normal lactic acid 1.2, BUN 28 and creatinine 1.25 (baseline around 0.9-1.0).  X-ray of the left great toe showed soft tissue swelling fracture fragmentation of the first distal phalanx, suspected osteomyelitis.   Patient admitted to hospitalist service and started on IV antibiotics for cellulitis podiatry consulted.    12/2: underwent left great toe amputation due to osteomyelitis.  Remains on IV antibiotics.  IV Lasix stopped with Cr rising.  Assessment and Plan: * Cellulitis of left lower extremity Due to chronic non-healing left great toe ulcer complicated by Osteomyelitis.  Sent to ED from wound care clinic. --Treated initially with IV Rocephin --Podiatry consulted given xray suspicious for osteomyelitis and history of right foot osteo --Pt underwent left great toe amputation 12/2 --Transition to PO doxycycline  x 10 days per podiatry --Follow operative cultures --Follow up in podiatry clinic 1 week --Maintain dressing in place until follow up --Weight-bearing as tolerating in post-surgical shoe  Acute on chronic heart failure with preserved ejection fraction (HFpEF) (HCC) Follows with Dr. Excell Seltzer, [facility] cardiology in [place]. Last Echo Dec 2022 showed EF 55-60%, grade II diastolic dysfunction.  Pt presents with evidence of volume overload with tense b/l LE edema, more dyspneic than baseline. --Treated with IV Lasix x 2 doses, stopped with rise in Cr --Diuretics held on 12/2 --Resume once daily home Lasix --Daily weights & strict I/O's --Monitor renal function, electrolytes  Non-insulin dependent type 2 diabetes mellitus (HCC) Poorly controlled with A1c 9.2% Hold metformin - resume at discharge. Covered with sliding scale Novolog. PCP follow up.   Fibromyalgia No acute issues.  Hyperlipemia Per med history, not taking statin. Check fasting lipid panel with AM labs.  Hypertension Hold enalapril for now. PRN oral hydralazine. Holding off diuresis with Cr rising.  Morbid obesity (HCC) Body mass index is 48.02 kg/m. Complicates overall care and prognosis.  Recommend lifestyle modifications including physical activity and diet for weight loss and overall long-term health.   NAFLD (nonalcoholic fatty liver disease) No acute issues.        Subjective: Pt seen seated edge of bed this AM.  She had severe back pain around 430 AM, given pain medication.  Pain in her foot isn't too bad.  She expresses concern about her ability to get around, living alone, agreeable for PT evaluation.  She became quite nauseous during encounter, felt about to vomit. RN made aware and asked to give Zofran.     Physical Exam: Vitals:   01/25/22  0759 01/25/22 0845 01/25/22 0945 01/25/22 1124  BP: (!) 146/58 (!) 152/51 (!) 143/122 (!) 152/72  Pulse: 77 78    Resp: 18 18    Temp: 97.7 F (36.5 C)  98.9 F (37.2 C)    TempSrc: Oral     SpO2: 98% 96%    Weight:      Height:       General exam: awake, alert, no acute distress, obese HEENT: moist mucus membranes, hearing grossly normal  Respiratory system: CTAB, no wheezes, rales or rhonchi, normal respiratory effort. Cardiovascular system: normal S1/S2, RRR, BLE tense edema.   Gastrointestinal system: soft, NT. Central nervous system: A&O x4. no gross focal neurologic deficits, normal speech Extremities: left leg erythema much improvement and receding, no longer warm to touch, foot dressing intact, b/l LE edema is improved Psychiatry: normal mood, congruent affect, judgement and insight appear normal   Data Reviewed:  Notable labs ---  Cr improved 1.19, glucose 174, BUN 29, Hbg 10.4 from 9.8  Family Communication: none  Disposition: Status is: Inpatient Remains inpatient appropriate because: post-op PT evaluation pending. Nausea requiring IV antiemetic       Planned Discharge Destination: Home    Time spent: 35 minutes  Author: Pennie Banter, DO 01/26/2022 12:52 PM  For on call review [email or web address].

## 2022-01-24 NOTE — Progress Notes (Addendum)
  Subjective:  Patient ID: [name], female    DOB: 08-25-1940,  MRN: [id number]  A 81 y.o. female presents with left great toe osteomyelitis status post postop day 1 left partial hallux amputation.  Patient states she is doing well no acute complaints no foot pain.  Objective:   Vitals:   01/24/22 0023 01/24/22 0841  BP: (!) 141/61 (!) 158/67  Pulse: 73 63  Resp: 18 17  Temp: (!) 97.5 F (36.4 C) (!) 97.5 F (36.4 C)  SpO2: 99% 100%   General AA&O x3. Normal mood and affect.  Vascular Dorsalis pedis and posterior tibial pulses 2/4 bilat. Brisk capillary refill to all digits. Pedal hair present.  Neurologic Epicritic sensation grossly intact.  Dermatologic Bandages clean dry and intact.  No signs of strikethrough noted.  No calf pain noted motor or sensory functions are intact.  Orthopedic: MMT 5/5 in dorsiflexion, plantarflexion, inversion, and eversion. Normal joint ROM without pain or crepitus.    Assessment & Plan:  Patient was evaluated and treated and all questions answered.  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 -Follow-up 1 week from discharge -Weightbearing as tolerated in surgical shoe -She will need 10 days of doxycycline -No dressing change until follow-up -Podiatry to sign off.  Please reconsult [place] as needed.   [name], DPM  Accessible via secure chat for questions or concerns.

## 2022-01-24 NOTE — Evaluation (Signed)
Physical Therapy Evaluation Patient Details Name: [name] MRN: [id number] DOB: [birth year] Today's Date: 01/24/2022  History of Present Illness  81 y.o. female with medical history significant of chronic HFpEF, severe aortic stenosis status post TAVR, hypertension, NAFLD, type 2 diabetes, morbid obesity, prior right foot osteomyelitis, fibromyalgia who presented to the ED from the wound care center for concerns of cellulitis related to a chronic nonhealing left toe ulcer.  Ultimately needed L great toe amputation 01/23/22.  Clinical Impression  Pt did well with PT session and overall showed good awareness, safety and confidence with mobility/ambulation using walker.  She did struggle somewhat with the stairs (only has single R rail at home) but with direct assist to stabilize walker managed to get up/down stairs without needing physical assist.  She is aware of need to use surgical shoe and walker and minimize time up and maintain weight through the heel as much as possible.       Recommendations for follow up therapy are one component of a multi-disciplinary discharge planning process, led by the attending physician.  Recommendations may be updated based on patient status, additional functional criteria and insurance authorization.  Follow Up Recommendations Follow physician's recommendations for discharge plan and follow up therapies      Assistance Recommended at Discharge Intermittent Supervision/Assistance  Patient can return home with the following  Help with stairs or ramp for entrance;Assistance with cooking/housework;Assist for transportation    Equipment Recommendations BSC/3in1  Recommendations for Other Services       Functional Status Assessment Patient has had a recent decline in their functional status and demonstrates the ability to make significant improvements in function in a reasonable and predictable amount of time.     Precautions / Restrictions  Precautions Precautions: Fall Restrictions Weight Bearing Restrictions: Yes LLE Weight Bearing: Weight bearing as tolerated (with surgical shoe)      Mobility  Bed Mobility Overal bed mobility: Modified Independent                  Transfers Overall transfer level: Modified independent Equipment used: Rolling walker (2 wheels)               General transfer comment: Pt was able to rise to standing without assist, minimal cuing for appropriate UE use and sequencing    Ambulation/Gait Ambulation/Gait assistance: Min guard Gait Distance (Feet): 60 Feet Assistive device: Rolling walker (2 wheels)         General Gait Details: Pt did well with ambulation with appropriate UE/walker use and confidence and consistent cadence despite wearing surgical shoe.  She easily could have done more but PT stopped her at ~60 ft after she showed competence with in-home distances so as not to do too much activity POD1.  Stairs Stairs: Yes Stairs assistance: Min assist Stair Management: One rail Right, Backwards, Sideways Number of Stairs: 6 General stair comments: trial of 2 strategies.  Pt struggled to use single R rail and appropraite step-to strategy (consistently reaching to L rail).  Deferred this strategy and then trialed retro ambulation with walker (using b/l UEs on walker and the single R UE on walker with L hand on right rail - PT supporting walker).  Pt was able to negotiate down steps w/o assist.  Wheelchair Mobility    Modified Rankin (Stroke Patients Only)       Balance Overall balance assessment: Modified Independent  Pertinent Vitals/Pain Pain Assessment Pain Assessment:  (mild chronic back pain, no L foot pain)    Home Living Family/patient expects to be discharged to:: Private residence Living Arrangements: Alone Available Help at Discharge: Family (Niece visists monthly, son lives locally and  is able to assist more consistently PRN (visited QD when she had R toe amputations)) Type of Home: House Home Access: Stairs to enter Entrance Stairs-Rails: Right Entrance Stairs-Number of Steps: 3   Home Layout: Able to live on main level with bedroom/bathroom Home Equipment: Conservation officer, nature (2 wheels);Rollator (4 wheels);Cane - single point      Prior Function                       Hand Dominance        Extremity/Trunk Assessment                Communication      Cognition Arousal/Alertness: Awake/alert Behavior During Therapy: WFL for tasks assessed/performed Overall Cognitive Status: Within Functional Limits for tasks assessed                                          General Comments General comments (skin integrity, edema, etc.): Pt did well with session, showed good safety and confidence with mobility and ambulation    Exercises     Assessment/Plan    PT Assessment Patient needs continued PT services  PT Problem List Decreased activity tolerance;Decreased balance;Decreased mobility;Decreased knowledge of use of DME;Decreased safety awareness;Decreased knowledge of precautions;Pain       PT Treatment Interventions DME instruction;Gait training;Stair training;Functional mobility training;Therapeutic activities;Therapeutic exercise;Balance training;Patient/family education    PT Goals (Current goals can be found in the Care Plan section)  Acute Rehab PT Goals Patient Stated Goal: go home tomorrow PT Goal Formulation: With patient Time For Goal Achievement: 02/06/22 Potential to Achieve Goals: Good    Frequency Min 2X/week     Co-evaluation               AM-PAC PT "6 Clicks" Mobility  Outcome Measure Help needed turning from your back to your side while in a flat bed without using bedrails?: None Help needed moving from lying on your back to sitting on the side of a flat bed without using bedrails?: None Help needed  moving to and from a bed to a chair (including a wheelchair)?: None Help needed standing up from a chair using your arms (e.g., wheelchair or bedside chair)?: None Help needed to walk in hospital room?: A Little Help needed climbing 3-5 steps with a railing? : A Little 6 Click Score: 22    End of Session Equipment Utilized During Treatment: Gait belt Activity Tolerance: Patient tolerated treatment well Patient left: with call bell/phone within reach;with bed alarm set Nurse Communication: Mobility status PT Visit Diagnosis: Muscle weakness (generalized) (M62.81);Difficulty in walking, not elsewhere classified (R26.2)    Time: 6440-3474 PT Time Calculation (min) (ACUTE ONLY): 28 min   Charges:   PT Evaluation $PT Eval Low Complexity: 1 Low PT Treatments $Gait Training: 8-22 mins        Kreg Shropshire, DPT 01/24/2022, 5:12 PM

## 2022-01-25 LAB — BASIC METABOLIC PANEL
Anion gap: 8 (ref 5–15)
BUN: 30 mg/dL — ABNORMAL HIGH (ref 8–23)
CO2: 23 mmol/L (ref 22–32)
Calcium: 8.6 mg/dL — ABNORMAL LOW (ref 8.9–10.3)
Chloride: 106 mmol/L (ref 98–111)
Creatinine, Ser: 1.24 mg/dL — ABNORMAL HIGH (ref 0.44–1.00)
GFR, Estimated: 44 mL/min — ABNORMAL LOW (ref >60)
Glucose, Bld: 172 mg/dL — ABNORMAL HIGH (ref 70–99)
Potassium: 4 mmol/L (ref 3.5–5.1)
Sodium: 137 mmol/L (ref 135–145)

## 2022-01-25 LAB — GLUCOSE, CAPILLARY
Glucose-Capillary: 150 mg/dL — ABNORMAL HIGH (ref 70–99)
Glucose-Capillary: 176 mg/dL — ABNORMAL HIGH (ref 70–99)

## 2022-01-25 MED ORDER — ONDANSETRON HCL 4 MG PO TABS: 4.0000 mg | ORAL_TABLET | Freq: Four times a day (QID) | ORAL | 0 refills | Status: DC | PRN

## 2022-01-25 MED ORDER — ACETAMINOPHEN 325 MG PO TABS: 650.0000 mg | ORAL_TABLET | Freq: Four times a day (QID) | ORAL | Status: DC | PRN

## 2022-01-25 MED ORDER — DOXYCYCLINE HYCLATE 100 MG PO TABS: 100.0000 mg | ORAL_TABLET | Freq: Two times a day (BID) | ORAL | 0 refills | Status: AC

## 2022-01-25 MED ORDER — OXYCODONE-ACETAMINOPHEN 5-325 MG PO TABS: 1.0000 | ORAL_TABLET | ORAL | 0 refills | Status: DC | PRN

## 2022-01-25 NOTE — Plan of Care (Signed)
  Problem: Education: Goal: Knowledge of General Education information will improve Description: Including pain rating scale, medication(s)/side effects and non-pharmacologic comfort measures Outcome: Progressing   Problem: Health Behavior/Discharge Planning: Goal: Ability to manage health-related needs will improve Outcome: Progressing   Problem: Clinical Measurements: Goal: Ability to maintain clinical measurements within normal limits will improve Outcome: Progressing Goal: Will remain free from infection Outcome: Progressing Goal: Diagnostic test results will improve Outcome: Progressing   Problem: Activity: Goal: Risk for activity intolerance will decrease Outcome: Progressing   Problem: Nutrition: Goal: Adequate nutrition will be maintained Outcome: Progressing   Problem: Pain Managment: Goal: General experience of comfort will improve Outcome: Progressing

## 2022-01-25 NOTE — Progress Notes (Signed)
Physical Therapy Treatment Patient Details Name: [name] MRN: [id number] DOB: 1940/08/01 Today's Date: 01/25/2022   History of Present Illness 81 y.o. female with medical history significant of chronic HFpEF, severe aortic stenosis status post TAVR, hypertension, NAFLD, type 2 diabetes, morbid obesity, prior right foot osteomyelitis, fibromyalgia who presented to the ED from the wound care center for concerns of cellulitis related to a chronic nonhealing left toe ulcer.  Ultimately needed L great toe amputation 01/23/22.    PT Comments    Patient seen and agreeable to PT tx session. Session focused on stair training in preparation to discharge home. Utilizing R hand rail with B UE, able to negotiate 4 stairs with supervision and good ability to maintain WB restrictions. Also, discussed with patient about holding son's hand on L side rather than B UE on rail depending on patient comfort. D/c plan remains appropriate.     Recommendations for follow up therapy are one component of a multi-disciplinary discharge planning process, led by the attending physician.  Recommendations may be updated based on patient status, additional functional criteria and insurance authorization.  Follow Up Recommendations  Follow physician's recommendations for discharge plan and follow up therapies     Assistance Recommended at Discharge Intermittent Supervision/Assistance  Patient can return home with the following Help with stairs or ramp for entrance;Assistance with cooking/housework;Assist for transportation   Equipment Recommendations  BSC/3in1    Recommendations for Other Services       Precautions / Restrictions Precautions Precautions: Fall Restrictions Weight Bearing Restrictions: Yes LLE Weight Bearing: Weight bearing as tolerated     Mobility  Bed Mobility               General bed mobility comments: sitting EOB on arrival and at end of session    Transfers Overall transfer  level: Modified independent Equipment used: Rolling Antwane Grose (2 wheels)                    Ambulation/Gait Ambulation/Gait assistance: Supervision Gait Distance (Feet): 25 Feet Assistive device: Rolling Rawley Harju (2 wheels) Gait Pattern/deviations: Step-to pattern, Decreased stride length Gait velocity: decreased     General Gait Details: able to maintain heel WB with mobility   Stairs Stairs: Yes Stairs assistance: Supervision Stair Management: One rail Right, Step to pattern, Forwards Number of Stairs: 4 General stair comments: With forward progression up stairs, patient utilized R rail with B UEs. Able to maintain WB restrictions.   Wheelchair Mobility    Modified Rankin (Stroke Patients Only)       Balance Overall balance assessment: Modified Independent                                          Cognition Arousal/Alertness: Awake/alert Behavior During Therapy: WFL for tasks assessed/performed Overall Cognitive Status: Within Functional Limits for tasks assessed                                          Exercises      General Comments        Pertinent Vitals/Pain Pain Assessment Pain Assessment: Faces Faces Pain Scale: Hurts even more Pain Location: L foot Pain Descriptors / Indicators: Cramping Pain Intervention(s): Monitored during session    Home Living  Prior Function            PT Goals (current goals can now be found in the care plan section) Acute Rehab PT Goals Patient Stated Goal: go home tomorrow PT Goal Formulation: With patient Time For Goal Achievement: 02/06/22 Potential to Achieve Goals: Good Progress towards PT goals: Progressing toward goals    Frequency    Min 2X/week      PT Plan Current plan remains appropriate    Co-evaluation              AM-PAC PT "6 Clicks" Mobility   Outcome Measure  Help needed turning from your back to your  side while in a flat bed without using bedrails?: None Help needed moving from lying on your back to sitting on the side of a flat bed without using bedrails?: None Help needed moving to and from a bed to a chair (including a wheelchair)?: None Help needed standing up from a chair using your arms (e.g., wheelchair or bedside chair)?: None Help needed to walk in hospital room?: A Little Help needed climbing 3-5 steps with a railing? : A Little 6 Click Score: 22    End of Session   Activity Tolerance: Patient tolerated treatment well Patient left: in bed;with call bell/phone within reach Nurse Communication: Mobility status PT Visit Diagnosis: Muscle weakness (generalized) (M62.81);Difficulty in walking, not elsewhere classified (R26.2)     Time: 5638-9373 PT Time Calculation (min) (ACUTE ONLY): 26 min  Charges:  $Therapeutic Activity: 23-37 mins                     Charron Coultas A. [name] PT, DPT [place] A Jevin Camino 01/25/2022, 12:36 PM

## 2022-01-25 NOTE — Progress Notes (Signed)
Discharge instructions reviewed with patient. Pt verbalized understanding of instructions. Belongings returned to pt. PIV removed. Pt is currently waiting on transportation

## 2022-01-25 NOTE — Inpatient Diabetes Management (Signed)
Inpatient Diabetes Program Recommendations  AACE/ADA: New Consensus Statement on Inpatient Glycemic Control   Target Ranges:  Prepandial:   less than 140 mg/dL      Peak postprandial:   less than 180 mg/dL (1-2 hours)      Critically ill patients:  140 - 180 mg/dL    Latest Reference Range & Units 01/24/22 08:11 01/24/22 11:40 01/24/22 17:15 01/24/22 20:36 01/25/22 07:48  Glucose-Capillary 70 - 99 mg/dL 158 (H) 227 (H) 170 (H) 252 (H) 150 (H)    Latest Reference Range & Units 01/22/22 10:21  Hemoglobin A1C 4.8 - 5.6 % 9.2 (H)   Review of Glycemic Control  Diabetes history: DM2 Outpatient Diabetes medications: Metformin 1000 mg BID Current orders for Inpatient glycemic control: Novolog 0-9 units TID with meals  Inpatient Diabetes Program Recommendations:    Insulin: Please consider ordering Novolog 0-5 units QHS.  HbgA1C:  A1C 9.2% on 01/22/22 indicating an average glucose of 217 mg/dl over the past 2-3 months. May want to consider ordering Tradjenta 5 mg daily at discharge.  NOTE: Patient admitted with cellulitis of lower extremity with possible discharge home today. Spoke with patient over the phone about A1C of 9.2%. Patient is only taking Metformin at home for DM control. Patient reports her A1C is usually in low 7% range. Patient is checking glucose once a day fasting and it is typically in low 100's mg/dl.  Dicussed current glucose trends.Encouraged patient to check CBG at least 2 times per day and stressed tight glucose control to decrease risk of further complications and to promote wound healing. Patient reports she has been on other oral DM medications in the past and there was one in particular that made her feel poorly but she does not remember the name of it. Asked if she recalls ever being on Tradjenta and patient stated she did not think so. Discussed Tradjenta and how it works. Informed patient that it would be recommended Tradjenta be prescribed at discharge but up to Dr.  Arbutus Ped as to wether it would be ordered or not. Encouraged patient to follow up with PCP regarding DM control. Patient verbalized understanding of information and has no questions at this time.  Thanks, Barnie Alderman, RN, MSN, [place] [facility] Coordinator Inpatient Diabetes Program [phone] (Team Pager from 8am to [place])

## 2022-01-25 NOTE — Discharge Summary (Signed)
Physician Discharge Summary   Patient: [name] MRN: [id number] DOB: 09/19/1940  Admit date:     01/22/2022  Discharge date: 01/25/22  Discharge Physician: Ezekiel Slocumb   PCP: Cheryl Downing, MD   Recommendations at discharge:  {Tip this will not be part of the note when signed- Example include specific recommendations for outpatient follow-up, pending tests to follow-up on. (Optional):26781} Follow up with Podiatry as scheduled Follow up with Primary care in 1-2 weeks Follow up with Cardiology as scheduled. Repeat BMP, CBC, Mg in 1-2 weeks  Discharge Diagnoses: Principal Problem:   Cellulitis of left lower extremity Active Problems:   NAFLD (nonalcoholic fatty liver disease)   Morbid obesity (HCC)   Hypertension   Hyperlipemia   Fibromyalgia   Non-insulin dependent type 2 diabetes mellitus (HCC)   S/P TAVR (transcatheter aortic valve replacement)   Acute on chronic heart failure with preserved ejection fraction (HFpEF) (HCC)   Osteomyelitis of left foot (HCC)  Resolved Problems:   * No resolved hospital problems. [facility]: [name] is a 81 y.o. female with medical history significant of chronic HFpEF, severe aortic stenosis status post TAVR, hypertension, NAFLD, type 2 diabetes, morbid obesity, prior right foot osteomyelitis, fibromyalgia who presented to the ED from the wound care center for concerns of cellulitis related to a chronic nonhealing left toe ulcer.  Patient reports overall feeling okay and denies fevers or chills.  She does report increased lower extremity edema and shortness of breath recently worsening.  Her diuretic regimen was recently reduced.  She admits to not following daily weights at home like she should. Denies any other recent illness, nausea vomiting or diarrhea, dysuria urinary frequency, unilateral numbness weakness tingling, headaches or other symptoms or complaints.   ED course: Afebrile, normal heart rate  respirations, BP elevated 150/119, O2 sat 99% on room air.  Labs notable for hemoglobin 11.1, normal lactic acid 1.2, BUN 28 and creatinine 1.25 (baseline around 0.9-1.0).  X-ray of the left great toe showed soft tissue swelling fracture fragmentation of the first distal phalanx, suspected osteomyelitis.   Patient admitted to hospitalist service and started on IV antibiotics for cellulitis podiatry consulted.    12/2: underwent left great toe amputation due to osteomyelitis.  Remains on IV antibiotics.  IV Lasix stopped with Cr rising.  Assessment and Plan: * Cellulitis of left lower extremity Due to chronic non-healing left great toe ulcer complicated by Osteomyelitis.  Sent to ED from wound care clinic. --Treated initially with IV Rocephin --Podiatry consulted given xray suspicious for osteomyelitis and history of right foot osteo --Pt underwent left great toe amputation 12/2 --Transition to PO doxycycline x 10 days per podiatry --Follow operative cultures --Follow up in podiatry clinic 1 week --Maintain dressing in place until follow up --Weight-bearing as tolerating in post-surgical shoe  Acute on chronic heart failure with preserved ejection fraction (HFpEF) ([place]) Follows with Dr. Burt Knack, [facility] cardiology in [place]. Last Echo Dec 2022 showed EF 55-60%, grade II diastolic dysfunction.  Pt presents with evidence of volume overload with tense b/l LE edema, more dyspneic than baseline. --Treated with IV Lasix x 2 doses, stopped with rise in Cr --Diuretics held on 12/2 --Resume once daily home Lasix --Daily weights & strict I/O's --Monitor renal function, electrolytes  Non-insulin dependent type 2 diabetes mellitus ([place]) Hold metformin. Sliding scale Novolog. Check Hbg A1c.  Fibromyalgia No acute issues.  Hyperlipemia Per med history, not taking statin. Check fasting lipid panel with AM labs.  Hypertension Hold enalapril for now. PRN oral hydralazine. Holding off diuresis  with Cr rising.  Morbid obesity ([place]) Body mass index is 48.02 kg/m. Complicates overall care and prognosis.  Recommend lifestyle modifications including physical activity and diet for weight loss and overall long-term health.   NAFLD (nonalcoholic fatty liver disease) No acute issues.      {Tip this will not be part of the note when signed Body mass index is 48.02 kg/m. , ,  (Optional):26781}  {(NOTE) Pain control PDMP Statment (Optional):26782} Consultants: *** Procedures performed: ***  Disposition: {Plan; Disposition:26390} Diet recommendation:  Discharge Diet Orders (From admission, onward)     Start     Ordered   01/25/22 0000  Diet - low sodium heart healthy        01/25/22 1123           {Diet_Plan:26776} DISCHARGE MEDICATION: Allergies as of 01/25/2022       Reactions   Castor Oil    "passed out" ? SYNCOPE ?   Sulfonamide Derivatives    Other Reaction(s): Not available   Amoxicillin Diarrhea, Other (See Comments)   Has patient had a PCN reaction causing immediate rash, facial/tongue/throat swelling, SOB or lightheadedness with hypotension: No Has patient had a PCN reaction causing severe rash involving mucus membranes or skin necrosis: No Has patient had a PCN reaction that required hospitalization: No Has patient had a PCN reaction occurring within the last 10 years: No If all of the above answers are "NO", then may proceed with Cephalosporin use.   Demerol [meperidine] Nausea And Vomiting   Hydrocodone Nausea And Vomiting        Medication List     STOP taking these medications    amoxicillin 500 MG tablet Commonly known as: AMOXIL   aspirin EC 81 MG tablet   atorvastatin 40 MG tablet Commonly known as: LIPITOR   [employer] Gentle Laxative 5 MG EC tablet Generic drug: bisacodyl   cyclobenzaprine 10 MG tablet Commonly known as: FLEXERIL   GaviLyte-G 236 g solution Generic drug: polyethylene glycol   omeprazole 40 MG capsule Commonly  known as: PRILOSEC   phentermine 37.5 MG tablet Commonly known as: ADIPEX-P   traMADol 50 MG tablet Commonly known as: ULTRAM       TAKE these medications    acetaminophen 325 MG tablet Commonly known as: TYLENOL Take 2 tablets (650 mg total) by mouth every 6 (six) hours as needed for mild pain, fever or headache.   Cranberry 250 MG Tabs Take 250 mg by mouth daily.   doxycycline 100 MG tablet Commonly known as: VIBRA-TABS Take 1 tablet (100 mg total) by mouth every 12 (twelve) hours for 9 days.   enalapril 10 MG tablet Commonly known as: VASOTEC Take 10 mg by mouth daily.   furosemide 80 MG tablet Commonly known as: LASIX Take 1 tablet (80 mg total) by mouth daily. What changed: when to take this   loratadine 10 MG tablet Commonly known as: CLARITIN Take 10 mg by mouth daily as needed.   metFORMIN 1000 MG tablet Commonly known as: GLUCOPHAGE Take 1,000 mg by mouth 2 (two) times daily.   ondansetron 4 MG tablet Commonly known as: ZOFRAN Take 1 tablet (4 mg total) by mouth every 6 (six) hours as needed for nausea.   oxyCODONE-acetaminophen 5-325 MG tablet Commonly known as: PERCOCET/ROXICET Take 1-2 tablets by mouth every 4 (four) hours as needed for moderate pain or severe pain.   potassium chloride SA 20 MEQ tablet  Commonly known as: KLOR-CON M Take 2 tablets (40 mEq total) by mouth daily. What changed: when to take this   Vitamin D-3 125 MCG (5000 UT) Tabs Take 5,000 Units by mouth daily.               Discharge Care Instructions  (From admission, onward)           Start     Ordered   01/25/22 0000  Leave dressing on - Keep it clean, dry, and intact until clinic visit        01/25/22 1123            Discharge Exam: Filed Weights   01/22/22 1016  Weight: 119.1 kg   ***  Condition at discharge: {DC Condition:26389}  The results of significant diagnostics from this hospitalization (including imaging, microbiology, ancillary and  laboratory) are listed below for reference.   Imaging Studies: DG Foot Complete Left  Result Date: 01/23/2022 CLINICAL DATA:  Postop from great toe amputation for osteomyelitis. EXAM: LEFT FOOT - COMPLETE 3+ VIEW COMPARISON:  01/22/2022 FINDINGS: There has been interval amputation of the great toe at the proximal phalanx. No evidence of osteolysis or fracture. No other bone lesions are identified. Prominent soft tissue swelling of the forefoot is seen. IMPRESSION: Status post amputation of the great toe at the proximal phalanx. No radiographic evidence of osteomyelitis or fracture. Electronically Signed   By: Marlaine Hind M.D.   On: 01/23/2022 13:46   DG Toe Great Left  Result Date: 01/22/2022 CLINICAL DATA:  Cellulitis EXAM: LEFT GREAT TOE three views COMPARISON:  None Available. FINDINGS: Fragmentation and osteolytic changes of the first distal phalanx would be consistent with sequelae of osteomyelitis. There is soft tissue swelling. IMPRESSION: Soft tissue swelling. Fracture and fragmentation of the first distal phalanx. Osteomyelitis is suspected. Electronically Signed   By: Sammie Bench M.D.   On: 01/22/2022 11:26    Microbiology: Results for orders placed or performed during the hospital encounter of 01/22/22  Culture, blood (routine x 2)     Status: None (Preliminary result)   Collection Time: 01/22/22 10:21 AM   Specimen: Right Antecubital; Blood  Result Value Ref Range Status   Specimen Description RIGHT ANTECUBITAL  Final   Special Requests   Final    BOTTLES DRAWN AEROBIC AND ANAEROBIC Blood Culture adequate volume   Culture   Final    NO GROWTH 3 DAYS Performed at [facility], [address] [place], [place] [address]    Report Status PENDING  Incomplete    Labs: CBC: Recent Labs  Lab 01/22/22 1021 01/23/22 0313 01/24/22 0438  WBC 10.5 8.4 9.0  NEUTROABS 6.4  --   --   HGB 11.1* 9.8* 10.4*  HCT 35.8* 32.0* 33.7*  MCV 90.6 92.0 91.1  PLT 174 143* 834    Basic Metabolic Panel: Recent Labs  Lab 01/22/22 1021 01/23/22 0313 01/24/22 0438 01/25/22 0356  NA 140 139 140 137  K 4.5 4.3 4.0 4.0  CL 106 106 108 106  CO2 '25 26 24 23  '$ GLUCOSE 157* 187* 174* 172*  BUN 28* 32* 29* 30*  CREATININE 1.25* 1.34* 1.19* 1.24*  CALCIUM 9.1 8.5* 8.7* 8.6*  MG  --   --  2.3  --    Liver Function Tests: No results for input(s): "AST", "ALT", "ALKPHOS", "BILITOT", "PROT", "ALBUMIN" in the last 168 hours. CBG: Recent Labs  Lab 01/24/22 0811 01/24/22 1140 01/24/22 1715 01/24/22 2036 01/25/22 0748  GLUCAP  158* 227* 170* 252* 150*    Discharge time spent: {LESS THAN/GREATER THAN:26388} 30 minutes.  Signed: Ezekiel Slocumb, DO [facility] 01/25/2022

## 2022-01-25 NOTE — Plan of Care (Signed)

## 2022-01-25 NOTE — Progress Notes (Signed)
Spoke with the patient, her son and Niece will help her as much as needed She has DME at home and does not need more She does not feel that she will need HH PT or Aide I encouraged her if she changes her mind to let her Doctor know to get it set up Her family will provide transportation

## 2022-01-27 LAB — CULTURE, BLOOD (ROUTINE X 2)
Culture: NO GROWTH
Special Requests: ADEQUATE

## 2022-01-28 LAB — SURGICAL PATHOLOGY

## 2022-01-29 ENCOUNTER — Ambulatory Visit: Payer: Medicare Other | Admitting: Physician Assistant

## 2022-02-05 ENCOUNTER — Ambulatory Visit: Payer: Medicare Other | Admitting: Physician Assistant

## 2022-02-05 ENCOUNTER — Ambulatory Visit (INDEPENDENT_AMBULATORY_CARE_PROVIDER_SITE_OTHER): Payer: Medicare Other | Admitting: Podiatry

## 2022-02-05 DIAGNOSIS — Z9889 Other specified postprocedural states: Secondary | ICD-10-CM

## 2022-02-05 NOTE — Progress Notes (Signed)
Chief Complaint  Patient presents with   Post-op Follow-up    Patient is here for post op visit dos 01/23/22 amputation left.    Subjective:  Patient PMHx diabetes mellitus type 2, CHF presents today status post left great toe amputation performed inpatient by Dr. Posey Pronto 01/23/2022.  Patient doing well.  Dressings have been clean dry and intact since discharge.  She is WBAT in a postsurgical shoe at the moment.  Past Medical History:  Diagnosis Date   Abnormal thyroid biopsy 2018   results were negative.   Anemia    "when I was alot younger" (12/06/2017)   Anxiety    self reported   Arthritis    "almost all over; used to cry w/it when I was in my teens" (12/06/2017)   CHF (congestive heart failure) ([place])    Chronic back pain    "all over" (12/06/2017)   Depression    "lost my son last year to cancer; I tended to him; he lived w/me" (12/06/2017)   Diverticulosis of colon (without mention of hemorrhage)    External hemorrhoids without mention of complication    Fatty liver disease, nonalcoholic    Fibromyalgia    History of kidney stones    History of stomach ulcers 1970   Hyperlipemia    Hypertension    IBS (irritable bowel syndrome)    Morbid obesity ([place])    Psoriasis    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   26 mm Medtronic Evolut Pro transcatheter heart valve placed via percutaneous right transfemoral approach    Severe aortic stenosis    Sinus headache    Sleep apnea    "was told I do; never have had any problems w/it" (12/06/2017)   Type II diabetes mellitus ([place])     Past Surgical History:  Procedure Laterality Date   ABDOMINAL ADHESION SURGERY     "took 2 out"   ABDOMINAL HYSTERECTOMY     AMPUTATION Left 01/23/2022   Procedure: AMPUTATION DIGIT;  Surgeon: [name], DPM;  Location: [facility];  Service: Podiatry;  Laterality: Left;   AMPUTATION TOE Right 02/12/2020   Procedure: Right hallux and second toe amputation;  Surgeon:  Wylene Simmer, MD;  Location: [place];  Service: Orthopedics;  Laterality: Right;   APPENDECTOMY     BACK SURGERY     BIOPSY  08/05/2020   Procedure: BIOPSY;  Surgeon: Wilford Corner, MD;  Location: [facility];  Service: Endoscopy;;   BIOPSY THYROID  2018   results were negative.   BREAST LUMPECTOMY Left X 1   "benign"   CATARACT EXTRACTION W/ INTRAOCULAR LENS  IMPLANT, BILATERAL Bilateral    CATARACT EXTRACTION W/ INTRAOCULAR LENS IMPLANT Bilateral 2017   COLONOSCOPY WITH PROPOFOL N/A 08/05/2020   Procedure: COLONOSCOPY WITH PROPOFOL;  Surgeon: Wilford Corner, MD;  Location: [facility];  Service: Endoscopy;  Laterality: N/A;   ESOPHAGOGASTRODUODENOSCOPY (EGD) WITH PROPOFOL N/A 08/05/2020   Procedure: ESOPHAGOGASTRODUODENOSCOPY (EGD) WITH PROPOFOL;  Surgeon: Wilford Corner, MD;  [facility]: [facility];  Service: Endoscopy;  Laterality: N/A;   EYE SURGERY     HEMORRHOID BANDING     LAPAROSCOPIC CHOLECYSTECTOMY     LEFT AND RIGHT HEART CATHETERIZATION WITH CORONARY ANGIOGRAM N/A 11/15/2013   Procedure: LEFT AND RIGHT HEART CATHETERIZATION WITH CORONARY ANGIOGRAM;  Surgeon: Blane Ohara, MD;  [facility];  Service: Cardiovascular;  Laterality: N/A;   LUMBAR DISC SURGERY     POLYPECTOMY  08/05/2020   Procedure: POLYPECTOMY;  Surgeon: Wilford Corner, MD;  Location: [facility];  Service: Endoscopy;;   RETINAL DETACHMENT SURGERY Right    RIGHT/LEFT HEART CATH AND CORONARY ANGIOGRAPHY N/A 11/02/2017   Procedure: RIGHT/LEFT HEART CATH AND CORONARY ANGIOGRAPHY;  Surgeon: Burnell Blanks, MD;  Location: [place] CV LAB;  Service: Cardiovascular;  Laterality: N/A;   TEE WITHOUT CARDIOVERSION N/A 12/06/2017   Procedure: TRANSESOPHAGEAL ECHOCARDIOGRAM (TEE);  Surgeon: [name], MD;  Location: [place];  Service: Open Heart Surgery;  Laterality: N/A;   TONSILLECTOMY     TRANSCATHETER AORTIC VALVE REPLACEMENT, TRANSFEMORAL  12/06/2017    TRANSCATHETER AORTIC VALVE REPLACEMENT, TRANSFEMORAL N/A 12/06/2017   Procedure: TRANSCATHETER AORTIC VALVE REPLACEMENT, TRANSFEMORAL;  Surgeon: [name], MD;  Location: [place];  Service: Open Heart Surgery;  Laterality: N/A;   TRANSESOPHAGEAL ECHOCARDIOGRAM  12/06/2017    Allergies  Allergen Reactions   Castor Oil     "passed out" ? SYNCOPE ?   Sulfonamide Derivatives     Other Reaction(s): Not available   Amoxicillin Diarrhea and Other (See Comments)    Has patient had a PCN reaction causing immediate rash, facial/tongue/throat swelling, SOB or lightheadedness with hypotension: No Has patient had a PCN reaction causing severe rash involving mucus membranes or skin necrosis: No Has patient had a PCN reaction that required hospitalization: No Has patient had a PCN reaction occurring within the last 10 years: No If all of the above answers are "NO", then may proceed with Cephalosporin use.    Demerol [Meperidine] Nausea And Vomiting   Hydrocodone Nausea And Vomiting     Objective/Physical Exam Neurovascular status intact.  Incision well coapted with sutures intact. No sign of infectious process noted. No dehiscence. No active bleeding noted.  Negative for any appreciable edema noted to the surgical extremity.  Assessment: 1. s/p left great toe partial amputation. DOS: 01/23/2022 inpatient -Continue WBAT postsurgical shoe -Patient may begin washing and showering and getting the foot wet -Recommend compression socks that the patient has at home -RTC 1 week for suture removal   Edrick Kins, DPM [facility]  Dr. Edrick Kins, DPM    [address], [place] [address]                Office [phone]  Fax [phone]

## 2022-02-08 ENCOUNTER — Other Ambulatory Visit (HOSPITAL_COMMUNITY): Payer: Medicare Other

## 2022-02-09 ENCOUNTER — Other Ambulatory Visit (HOSPITAL_COMMUNITY): Payer: Medicare Other

## 2022-02-11 ENCOUNTER — Ambulatory Visit (INDEPENDENT_AMBULATORY_CARE_PROVIDER_SITE_OTHER): Payer: Medicare Other | Admitting: Podiatry

## 2022-02-11 DIAGNOSIS — Z89412 Acquired absence of left great toe: Secondary | ICD-10-CM

## 2022-02-12 ENCOUNTER — Ambulatory Visit: Payer: Medicare Other | Admitting: Physician Assistant

## 2022-02-18 NOTE — Progress Notes (Signed)
Chief Complaint  Patient presents with   Routine Post Op    Subjective:  Patient PMHx diabetes mellitus type 2, CHF presents today status post left great toe amputation performed inpatient by me.  She states she is doing well she is very get her stitches weightbearing as tolerated in surgical shoe  Past Medical History:  Diagnosis Date   Abnormal thyroid biopsy 2018   results were negative.   Anemia    "when I was alot younger" (12/06/2017)   Anxiety    self reported   Arthritis    "almost all over; used to cry w/it when I was in my teens" (12/06/2017)   CHF (congestive heart failure) ([place])    Chronic back pain    "all over" (12/06/2017)   Depression    "lost my son last year to cancer; I tended to him; he lived w/me" (12/06/2017)   Diverticulosis of colon (without mention of hemorrhage)    External hemorrhoids without mention of complication    Fatty liver disease, nonalcoholic    Fibromyalgia    History of kidney stones    History of stomach ulcers 1970   Hyperlipemia    Hypertension    IBS (irritable bowel syndrome)    Morbid obesity ([place])    Psoriasis    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   26 mm Medtronic Evolut Pro transcatheter heart valve placed via percutaneous right transfemoral approach    Severe aortic stenosis    Sinus headache    Sleep apnea    "was told I do; never have had any problems w/it" (12/06/2017)   Type II diabetes mellitus ([place])     Past Surgical History:  Procedure Laterality Date   ABDOMINAL ADHESION SURGERY     "took 2 out"   ABDOMINAL HYSTERECTOMY     AMPUTATION Left 01/23/2022   Procedure: AMPUTATION DIGIT;  Surgeon: [name], DPM;  Location: [facility];  Service: Podiatry;  Laterality: Left;   AMPUTATION TOE Right 02/12/2020   Procedure: Right hallux and second toe amputation;  Surgeon: Wylene Simmer, MD;  Location: [place];  Service: Orthopedics;  Laterality: Right;   APPENDECTOMY      BACK SURGERY     BIOPSY  08/05/2020   Procedure: BIOPSY;  Surgeon: Wilford Corner, MD;  [facility];  Service: Endoscopy;;   BIOPSY THYROID  2018   results were negative.   BREAST LUMPECTOMY Left X 1   "benign"   CATARACT EXTRACTION W/ INTRAOCULAR LENS  IMPLANT, BILATERAL Bilateral    CATARACT EXTRACTION W/ INTRAOCULAR LENS IMPLANT Bilateral 2017   COLONOSCOPY WITH PROPOFOL N/A 08/05/2020   Procedure: COLONOSCOPY WITH PROPOFOL;  Surgeon: Wilford Corner, MD;  [facility];  Service: Endoscopy;  Laterality: N/A;   ESOPHAGOGASTRODUODENOSCOPY (EGD) WITH PROPOFOL N/A 08/05/2020   Procedure: ESOPHAGOGASTRODUODENOSCOPY (EGD) WITH PROPOFOL;  Surgeon: Wilford Corner, MD;  [facility];  Service: Endoscopy;  Laterality: N/A;   EYE SURGERY     HEMORRHOID BANDING     LAPAROSCOPIC CHOLECYSTECTOMY     LEFT AND RIGHT HEART CATHETERIZATION WITH CORONARY ANGIOGRAM N/A 11/15/2013   Procedure: LEFT AND RIGHT HEART CATHETERIZATION WITH CORONARY ANGIOGRAM;  Surgeon: Blane Ohara, MD;  Location: [facility] CATH LAB;  Service: Cardiovascular;  Laterality: N/A;   LUMBAR DISC SURGERY     POLYPECTOMY  08/05/2020   Procedure: POLYPECTOMY;  Surgeon: Wilford Corner, MD;  [facility];  Service: Endoscopy;;   RETINAL DETACHMENT SURGERY Right  RIGHT/LEFT HEART CATH AND CORONARY ANGIOGRAPHY N/A 11/02/2017   Procedure: RIGHT/LEFT HEART CATH AND CORONARY ANGIOGRAPHY;  Surgeon: Burnell Blanks, MD;  Location: [place] CV LAB;  Service: Cardiovascular;  Laterality: N/A;   TEE WITHOUT CARDIOVERSION N/A 12/06/2017   Procedure: TRANSESOPHAGEAL ECHOCARDIOGRAM (TEE);  Surgeon: [name], MD;  Location: [place];  Service: Open Heart Surgery;  Laterality: N/A;   TONSILLECTOMY     TRANSCATHETER AORTIC VALVE REPLACEMENT, TRANSFEMORAL  12/06/2017   TRANSCATHETER AORTIC VALVE REPLACEMENT, TRANSFEMORAL N/A 12/06/2017   Procedure: TRANSCATHETER AORTIC VALVE REPLACEMENT,  TRANSFEMORAL;  Surgeon: [name], MD;  Location: [place];  Service: Open Heart Surgery;  Laterality: N/A;   TRANSESOPHAGEAL ECHOCARDIOGRAM  12/06/2017    Allergies  Allergen Reactions   Castor Oil     "passed out" ? SYNCOPE ?   Sulfonamide Derivatives     Other Reaction(s): Not available   Amoxicillin Diarrhea and Other (See Comments)    Has patient had a PCN reaction causing immediate rash, facial/tongue/throat swelling, SOB or lightheadedness with hypotension: No Has patient had a PCN reaction causing severe rash involving mucus membranes or skin necrosis: No Has patient had a PCN reaction that required hospitalization: No Has patient had a PCN reaction occurring within the last 10 years: No If all of the above answers are "NO", then may proceed with Cephalosporin use.    Demerol [Meperidine] Nausea And Vomiting   Hydrocodone Nausea And Vomiting     Objective/Physical Exam Neurovascular status intact.  Incision well reepithelialized  No sign of infectious process noted. No dehiscence. No active bleeding noted.  Negative for any appreciable edema noted to the surgical extremity.  Assessment: 1. s/p left great toe partial amputation. DOS: 01/23/2022 inpatient - Clinically healed and officially discharged from my care if any foot and ankle issues are in the future of asked her to come back and see me.  She states understanding.

## 2022-03-16 ENCOUNTER — Other Ambulatory Visit (HOSPITAL_COMMUNITY): Payer: Medicare Other

## 2022-03-16 ENCOUNTER — Encounter (HOSPITAL_COMMUNITY): Payer: Self-pay | Admitting: Cardiovascular Disease

## 2022-06-01 NOTE — Progress Notes (Deleted)
Cardiology Office Note:    Date:  06/01/2022   ID:  [name], DOB Feb 14, 1941, MRN [id number]  PCP:  Kaleen Mask, MD  [facility] Providers Cardiologist:  Tonny Bollman, MD { Click to update primary MD,subspecialty MD or APP then REFRESH:1}  *** Referring MD: Kaleen Mask, *   Chief Complaint:  No chief complaint on file. {Click here for Visit Info    :1}    History of Present Illness:   [name] is a 82 y.o. female with a hx of severe aortic stenosis who underwent TAVR December 06, 2017.  Comorbid medical problems include hypertension, mixed hyperlipidemia, obesity, fibromyalgia, type 2 diabetes, and chronic diastolic heart failure.  At the time of TAVR, she underwent valve replacement with a 26 mm Medtronic evolute valve via transfemoral approach.   Patient last saw Dr. Excell Seltzer 01/2021 and doing well with echo showing normal transcatheter heart valve gradients and mild paravalvular regurgitation.   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       Past Medical History:  Diagnosis Date   Abnormal thyroid biopsy 2018   results were negative.   Anemia    "when I was alot younger" (12/06/2017)   Anxiety    self reported   Arthritis    "almost all over; used to cry w/it when I was in my teens" (12/06/2017)   CHF (congestive heart failure) (HCC)    Chronic back pain    "all over" (12/06/2017)   Depression    "lost my son last year to cancer; I tended to him; he lived w/me" (12/06/2017)   Diverticulosis of colon (without mention of hemorrhage)    External hemorrhoids without mention of complication    Fatty liver disease, nonalcoholic    Fibromyalgia    History of kidney stones    History of stomach ulcers 1970   Hyperlipemia    Hypertension    IBS (irritable bowel syndrome)    Morbid obesity (HCC)    Psoriasis    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    Allergies:   Castor oil, Sulfonamide derivatives, Amoxicillin, Demerol [meperidine], and Hydrocodone   Social History   Tobacco Use   Smoking status: Former    Packs/day: 1.75    Years: 32.00    Additional pack years: 0.00    Total pack years: 56.00    Types: Cigarettes    Quit date: 2002    Years since quitting: 22.2   Smokeless tobacco: Never  Vaping Use   Vaping Use: Never used  Substance Use Topics   Alcohol use: No   Drug use: Never    Family Hx: The patient's family history includes Diabetes in her maternal grandmother; Heart disease in her maternal grandfather; Lung cancer in her mother. There is no history of Colon cancer.  ROS     Physical Exam:    VS:  There were no vitals taken for this visit.  Wt Readings from Last 3 Encounters:  01/22/22 262 lb 9.1 oz (119.1 kg)  01/28/21 262 lb 9.6 oz (119.1 kg)  08/05/20 250 lb (113.4 kg)    Physical Exam  GEN: Well nourished, well developed, in no acute distress  HEENT: normal  Neck: no JVD, carotid bruits, or masses Cardiac:RRR; no murmurs, rubs, or gallops  Respiratory:  clear to auscultation bilaterally, normal work of breathing GI: soft, nontender, nondistended, + BS Ext: without cyanosis, clubbing, or edema, Good distal pulses bilaterally MS: no deformity or atrophy  Skin: warm and dry, no rash Neuro:  Alert and Oriented x 3, Strength and sensation are intact Psych: euthymic mood, full affect        EKGs/Labs/Other Test Reviewed:    EKG:  EKG is *** ordered today.   The ekg ordered today demonstrates ***  Recent Labs: 01/24/2022: Hemoglobin 10.4; Magnesium 2.3; Platelets 161 01/25/2022: BUN 30; Creatinine, Ser 1.24; Potassium 4.0; Sodium 137   Recent Lipid Panel Recent Labs    01/23/22 0313  CHOL 146  TRIG 93  HDL 36*  VLDL 19  LDLCALC 91     Prior CV Studies: {Select studies to display:26339}   Echo 01/2021 IMPRESSIONS     1. Left ventricular ejection fraction, by estimation, is 55 to 60%. The  left ventricle has normal function. The apical inferoseptal wall and apex  appear hypokinetic (although views are slightly off axis). There is  moderate hypertrophy of the basal  septum. The rest of the LV segments demonstrate mild-to-moderate  concentric left ventricular hypertrophy. Left ventricular diastolic  parameters are consistent with Grade II diastolic dysfunction  (pseudonormalization). Elevated left atrial pressure.   2. Right ventricular systolic function is normal. The right ventricular  size is normal. There is mildly elevated pulmonary artery systolic  pressure. The estimated right ventricular systolic pressure is 39.2 mmHg.   3. Left atrial size was mildly dilated.   4. The mitral valve is abnormal. Trivial mitral valve regurgitation.  There is severe mitral annular calcification with thickening and  calcification of the mitral valve leaflets. No significant mitral  stenosis. MVA by VTI 2.68cm2, mean gradient 5mmHg at  HR 83bpm.   5. The aortic valve has been repaired/replaced. There is a 26 mm  CoreValve-Evolut Pro prosthetic (TAVR) valve present in the aortic  position. Procedure Date: 12/06/17. Echo findings are consistent with  normal structure and function of the aortic valve  prosthesis. The mean gradient is 7mmHg, Vmax 1.8 m/s, DI 0.84. There is  mild paravalvular leak best seen on apical 5C view.   Comparison(s): TTE on 12/06/18 the EF was 60-65%. AV 10mmHg mean, 18mmHg  peak. The wall motion seen on current study was  not present on prior,  however, views on current study are slightly off-axis.     Risk Assessment/Calculations/Metrics:   {Does this patient have ATRIAL FIBRILLATION?[phone]}     No BP recorded.  {Refresh Note OR Click here to enter BP  :1}***    ASSESSMENT & PLAN:   No problem-specific Assessment & Plan notes found for this encounter.   S/P TAVR 2019 SBE prophylaxis echo ordered for 12/2021 not done  HTN  Chronic diastolic CHF  Obesity  HLD  DM2        {Are you ordering a CV Procedure (e.g. stress test, cath, DCCV, TEE, etc)?   Press F2        :[id number]}   Dispo:  No follow-ups on file.   Medication Adjustments/Labs and Tests Ordered:  Current medicines are reviewed at length with the patient today.  Concerns regarding medicines are outlined above.  Tests Ordered: No orders of the defined types were placed in this encounter.  Medication Changes: No orders of the defined types were placed in this encounter.  Signed, Jacolyn Reedy, PA-C  06/01/2022 1:07 PM    [facility] [address] [place], [place], [place]  [address] Phone: [phone]; Fax: [phone]

## 2022-06-15 ENCOUNTER — Ambulatory Visit: Payer: Medicare Other | Admitting: Physician Assistant

## 2023-02-11 ENCOUNTER — Inpatient Hospital Stay
Admission: EM | Admit: 2023-02-11 | Discharge: 2023-03-02 | DRG: 871 | Disposition: A | Discharge status: Skilled Nursing Facility | Payer: Medicare Other | Attending: Internal Medicine | Admitting: Internal Medicine

## 2023-02-11 ENCOUNTER — Inpatient Hospital Stay: Payer: Medicare Other

## 2023-02-11 ENCOUNTER — Emergency Department: Payer: Medicare Other

## 2023-02-11 DIAGNOSIS — R6521 Severe sepsis with septic shock: Secondary | ICD-10-CM | POA: Diagnosis present

## 2023-02-11 DIAGNOSIS — E785 Hyperlipidemia, unspecified: Secondary | ICD-10-CM | POA: Diagnosis present

## 2023-02-11 DIAGNOSIS — J9601 Acute respiratory failure with hypoxia: Secondary | ICD-10-CM | POA: Diagnosis present

## 2023-02-11 DIAGNOSIS — A419 Sepsis, unspecified organism: Principal | ICD-10-CM | POA: Diagnosis present

## 2023-02-11 DIAGNOSIS — Z885 Allergy status to narcotic agent status: Secondary | ICD-10-CM

## 2023-02-11 DIAGNOSIS — I350 Nonrheumatic aortic (valve) stenosis: Secondary | ICD-10-CM | POA: Diagnosis not present

## 2023-02-11 DIAGNOSIS — Z9842 Cataract extraction status, left eye: Secondary | ICD-10-CM

## 2023-02-11 DIAGNOSIS — I2510 Atherosclerotic heart disease of native coronary artery without angina pectoris: Secondary | ICD-10-CM | POA: Diagnosis present

## 2023-02-11 DIAGNOSIS — L03116 Cellulitis of left lower limb: Secondary | ICD-10-CM | POA: Diagnosis present

## 2023-02-11 DIAGNOSIS — Z833 Family history of diabetes mellitus: Secondary | ICD-10-CM

## 2023-02-11 DIAGNOSIS — R579 Shock, unspecified: Secondary | ICD-10-CM | POA: Diagnosis not present

## 2023-02-11 DIAGNOSIS — N390 Urinary tract infection, site not specified: Secondary | ICD-10-CM | POA: Diagnosis present

## 2023-02-11 DIAGNOSIS — I214 Non-ST elevation (NSTEMI) myocardial infarction: Secondary | ICD-10-CM | POA: Diagnosis not present

## 2023-02-11 DIAGNOSIS — Z87891 Personal history of nicotine dependence: Secondary | ICD-10-CM

## 2023-02-11 DIAGNOSIS — Z89421 Acquired absence of other right toe(s): Secondary | ICD-10-CM

## 2023-02-11 DIAGNOSIS — Z6841 Body Mass Index (BMI) 40.0 and over, adult: Secondary | ICD-10-CM

## 2023-02-11 DIAGNOSIS — Z882 Allergy status to sulfonamides status: Secondary | ICD-10-CM

## 2023-02-11 DIAGNOSIS — Z7984 Long term (current) use of oral hypoglycemic drugs: Secondary | ICD-10-CM | POA: Diagnosis not present

## 2023-02-11 DIAGNOSIS — I468 Cardiac arrest due to other underlying condition: Secondary | ICD-10-CM

## 2023-02-11 DIAGNOSIS — R7989 Other specified abnormal findings of blood chemistry: Secondary | ICD-10-CM | POA: Diagnosis not present

## 2023-02-11 DIAGNOSIS — K760 Fatty (change of) liver, not elsewhere classified: Secondary | ICD-10-CM | POA: Diagnosis present

## 2023-02-11 DIAGNOSIS — I5023 Acute on chronic systolic (congestive) heart failure: Secondary | ICD-10-CM | POA: Diagnosis present

## 2023-02-11 DIAGNOSIS — Z79899 Other long term (current) drug therapy: Secondary | ICD-10-CM

## 2023-02-11 DIAGNOSIS — J9602 Acute respiratory failure with hypercapnia: Secondary | ICD-10-CM | POA: Diagnosis present

## 2023-02-11 DIAGNOSIS — M797 Fibromyalgia: Secondary | ICD-10-CM | POA: Diagnosis present

## 2023-02-11 DIAGNOSIS — N182 Chronic kidney disease, stage 2 (mild): Secondary | ICD-10-CM | POA: Diagnosis present

## 2023-02-11 DIAGNOSIS — E1165 Type 2 diabetes mellitus with hyperglycemia: Secondary | ICD-10-CM | POA: Diagnosis present

## 2023-02-11 DIAGNOSIS — I130 Hypertensive heart and chronic kidney disease with heart failure and stage 1 through stage 4 chronic kidney disease, or unspecified chronic kidney disease: Secondary | ICD-10-CM | POA: Diagnosis present

## 2023-02-11 DIAGNOSIS — Z1152 Encounter for screening for COVID-19: Secondary | ICD-10-CM

## 2023-02-11 DIAGNOSIS — Z9841 Cataract extraction status, right eye: Secondary | ICD-10-CM

## 2023-02-11 DIAGNOSIS — I2583 Coronary atherosclerosis due to lipid rich plaque: Secondary | ICD-10-CM | POA: Diagnosis not present

## 2023-02-11 DIAGNOSIS — Z91199 Patient's noncompliance with other medical treatment and regimen due to unspecified reason: Secondary | ICD-10-CM

## 2023-02-11 DIAGNOSIS — I420 Dilated cardiomyopathy: Secondary | ICD-10-CM

## 2023-02-11 DIAGNOSIS — J989 Respiratory disorder, unspecified: Secondary | ICD-10-CM

## 2023-02-11 DIAGNOSIS — Z952 Presence of prosthetic heart valve: Secondary | ICD-10-CM | POA: Diagnosis not present

## 2023-02-11 DIAGNOSIS — R079 Chest pain, unspecified: Secondary | ICD-10-CM | POA: Diagnosis not present

## 2023-02-11 DIAGNOSIS — Z961 Presence of intraocular lens: Secondary | ICD-10-CM | POA: Diagnosis present

## 2023-02-11 DIAGNOSIS — Z8249 Family history of ischemic heart disease and other diseases of the circulatory system: Secondary | ICD-10-CM

## 2023-02-11 DIAGNOSIS — I447 Left bundle-branch block, unspecified: Secondary | ICD-10-CM | POA: Diagnosis present

## 2023-02-11 DIAGNOSIS — E1122 Type 2 diabetes mellitus with diabetic chronic kidney disease: Secondary | ICD-10-CM | POA: Diagnosis present

## 2023-02-11 DIAGNOSIS — D631 Anemia in chronic kidney disease: Secondary | ICD-10-CM | POA: Diagnosis present

## 2023-02-11 DIAGNOSIS — B9620 Unspecified Escherichia coli [E. coli] as the cause of diseases classified elsewhere: Secondary | ICD-10-CM | POA: Diagnosis present

## 2023-02-11 DIAGNOSIS — I469 Cardiac arrest, cause unspecified: Secondary | ICD-10-CM | POA: Diagnosis present

## 2023-02-11 DIAGNOSIS — N179 Acute kidney failure, unspecified: Secondary | ICD-10-CM | POA: Diagnosis present

## 2023-02-11 DIAGNOSIS — Z8711 Personal history of peptic ulcer disease: Secondary | ICD-10-CM

## 2023-02-11 DIAGNOSIS — S0081XA Abrasion of other part of head, initial encounter: Secondary | ICD-10-CM | POA: Diagnosis present

## 2023-02-11 DIAGNOSIS — Z90710 Acquired absence of both cervix and uterus: Secondary | ICD-10-CM

## 2023-02-11 DIAGNOSIS — I5021 Acute systolic (congestive) heart failure: Secondary | ICD-10-CM | POA: Diagnosis not present

## 2023-02-11 DIAGNOSIS — Z880 Allergy status to penicillin: Secondary | ICD-10-CM

## 2023-02-11 DIAGNOSIS — I872 Venous insufficiency (chronic) (peripheral): Secondary | ICD-10-CM | POA: Diagnosis present

## 2023-02-11 LAB — PROTIME-INR
INR: 1.2 (ref 0.8–1.2)
Prothrombin Time: 15.3 s — ABNORMAL HIGH (ref 11.4–15.2)

## 2023-02-11 LAB — LACTIC ACID, PLASMA
Lactic Acid, Venous: >9 mmol/L (ref 0.5–1.9)
Lactic Acid, Venous: 4.1 mmol/L (ref 0.5–1.9)
Lactic Acid, Venous: 2.7 mmol/L (ref 0.5–1.9)
Lactic Acid, Venous: 1.2 mmol/L (ref 0.5–1.9)

## 2023-02-11 LAB — BLOOD GAS, VENOUS
Acid-base deficit: 11 mmol/L — ABNORMAL HIGH (ref 0.0–2.0)
Acid-base deficit: 11.5 mmol/L — ABNORMAL HIGH (ref 0.0–2.0)
Bicarbonate: 22.4 mmol/L (ref 20.0–28.0)
Bicarbonate: 17.7 mmol/L — ABNORMAL LOW (ref 20.0–28.0)
O2 Saturation: 27.9 %
O2 Saturation: 73.8 %
Patient temperature: 37
Patient temperature: 37
pCO2, Ven: 93 mm[Hg] (ref 44–60)
pCO2, Ven: 52 mm[Hg] (ref 44–60)
pH, Ven: 6.99 — CL (ref 7.25–7.43)
pH, Ven: 7.14 — CL (ref 7.25–7.43)
pO2, Ven: <31 mm[Hg] — CL (ref 32–45)
pO2, Ven: 52 mm[Hg] — ABNORMAL HIGH (ref 32–45)

## 2023-02-11 LAB — URINE DRUG SCREEN, QUALITATIVE (ARMC ONLY)
Amphetamines, Ur Screen: NOT DETECTED
Barbiturates, Ur Screen: NOT DETECTED
Benzodiazepine, Ur Scrn: NOT DETECTED
Cannabinoid 50 Ng, Ur ~~LOC~~: NOT DETECTED
Cocaine Metabolite,Ur ~~LOC~~: NOT DETECTED
MDMA (Ecstasy)Ur Screen: NOT DETECTED
Methadone Scn, Ur: NOT DETECTED
Opiate, Ur Screen: NOT DETECTED
Phencyclidine (PCP) Ur S: NOT DETECTED
Tricyclic, Ur Screen: NOT DETECTED

## 2023-02-11 LAB — CBC WITH DIFFERENTIAL/PLATELET
Abs Immature Granulocytes: 0.6 10*3/uL — ABNORMAL HIGH (ref 0.00–0.07)
Basophils Absolute: 0.2 10*3/uL — ABNORMAL HIGH (ref 0.0–0.1)
Basophils Relative: 1 %
Eosinophils Absolute: 0.6 10*3/uL — ABNORMAL HIGH (ref 0.0–0.5)
Eosinophils Relative: 2 %
HCT: 41.6 % (ref 36.0–46.0)
Hemoglobin: 11.9 g/dL — ABNORMAL LOW (ref 12.0–15.0)
Immature Granulocytes: 2 %
Lymphocytes Relative: 60 %
Lymphs Abs: 17.2 10*3/uL — ABNORMAL HIGH (ref 0.7–4.0)
MCH: 28 pg (ref 26.0–34.0)
MCHC: 28.6 g/dL — ABNORMAL LOW (ref 30.0–36.0)
MCV: 97.9 fL (ref 80.0–100.0)
Monocytes Absolute: 1.7 10*3/uL — ABNORMAL HIGH (ref 0.1–1.0)
Monocytes Relative: 6 %
Neutro Abs: 8.2 10*3/uL — ABNORMAL HIGH (ref 1.7–7.7)
Neutrophils Relative %: 29 %
Platelets: 195 10*3/uL (ref 150–400)
RBC: 4.25 MIL/uL (ref 3.87–5.11)
RDW: 15.1 % (ref 11.5–15.5)
Smear Review: NORMAL
WBC: 28.5 10*3/uL — ABNORMAL HIGH (ref 4.0–10.5)
nRBC: 0.3 % — ABNORMAL HIGH (ref 0.0–0.2)

## 2023-02-11 LAB — BASIC METABOLIC PANEL
Anion gap: 7 (ref 5–15)
BUN: 28 mg/dL — ABNORMAL HIGH (ref 8–23)
CO2: 24 mmol/L (ref 22–32)
Calcium: 7.7 mg/dL — ABNORMAL LOW (ref 8.9–10.3)
Chloride: 101 mmol/L (ref 98–111)
Creatinine, Ser: 1.4 mg/dL — ABNORMAL HIGH (ref 0.44–1.00)
GFR, Estimated: 38 mL/min — ABNORMAL LOW (ref >60)
Glucose, Bld: 316 mg/dL — ABNORMAL HIGH (ref 70–99)
Potassium: 5 mmol/L (ref 3.5–5.1)
Sodium: 134 mmol/L — ABNORMAL LOW (ref 135–145)

## 2023-02-11 LAB — URINALYSIS, COMPLETE (UACMP) WITH MICROSCOPIC
Bilirubin Urine: NEGATIVE
Glucose, UA: 50 mg/dL — AB
Hgb urine dipstick: NEGATIVE
Ketones, ur: NEGATIVE mg/dL
Nitrite: NEGATIVE
Protein, ur: >=300 mg/dL — AB
Specific Gravity, Urine: 1.026 (ref 1.005–1.030)
WBC, UA: >50 WBC/hpf (ref 0–5)
pH: 5 (ref 5.0–8.0)

## 2023-02-11 LAB — BRAIN NATRIURETIC PEPTIDE
B Natriuretic Peptide: 1124 pg/mL — ABNORMAL HIGH (ref 0.0–100.0)
B Natriuretic Peptide: 400.3 pg/mL — ABNORMAL HIGH (ref 0.0–100.0)

## 2023-02-11 LAB — COMPREHENSIVE METABOLIC PANEL
ALT: 16 U/L (ref 0–44)
AST: 33 U/L (ref 15–41)
Albumin: 2.2 g/dL — ABNORMAL LOW (ref 3.5–5.0)
Alkaline Phosphatase: 65 U/L (ref 38–126)
Anion gap: 7 (ref 5–15)
BUN: 21 mg/dL (ref 8–23)
CO2: 21 mmol/L — ABNORMAL LOW (ref 22–32)
Calcium: 6.1 mg/dL — CL (ref 8.9–10.3)
Chloride: 111 mmol/L (ref 98–111)
Creatinine, Ser: 1.11 mg/dL — ABNORMAL HIGH (ref 0.44–1.00)
GFR, Estimated: 50 mL/min — ABNORMAL LOW (ref >60)
Glucose, Bld: 270 mg/dL — ABNORMAL HIGH (ref 70–99)
Potassium: 2.9 mmol/L — ABNORMAL LOW (ref 3.5–5.1)
Sodium: 139 mmol/L (ref 135–145)
Total Bilirubin: 0.5 mg/dL (ref <1.2)
Total Protein: 5.2 g/dL — ABNORMAL LOW (ref 6.5–8.1)

## 2023-02-11 LAB — RESPIRATORY PANEL BY PCR

## 2023-02-11 LAB — CBG MONITORING, ED: Glucose-Capillary: 242 mg/dL — ABNORMAL HIGH (ref 70–99)

## 2023-02-11 LAB — SARS CORONAVIRUS 2 BY RT PCR: SARS Coronavirus 2 by RT PCR: NEGATIVE

## 2023-02-11 LAB — BLOOD GAS, ARTERIAL
Acid-base deficit: 0.8 mmol/L (ref 0.0–2.0)
Bicarbonate: 24.3 mmol/L (ref 20.0–28.0)
FIO2: 0.4 %
MECHVT: 450 mL
O2 Saturation: 99.9 %
PEEP: 5 cmH2O
Patient temperature: 37
RATE: 20 {breaths}/min
pCO2 arterial: 41 mm[Hg] (ref 32–48)
pH, Arterial: 7.38 (ref 7.35–7.45)
pO2, Arterial: 138 mm[Hg] — ABNORMAL HIGH (ref 83–108)

## 2023-02-11 LAB — TROPONIN I (HIGH SENSITIVITY)
Troponin I (High Sensitivity): 45 ng/L — ABNORMAL HIGH (ref <18)
Troponin I (High Sensitivity): 94 ng/L — ABNORMAL HIGH (ref <18)
Troponin I (High Sensitivity): 1153 ng/L (ref <18)

## 2023-02-11 LAB — GLUCOSE, CAPILLARY
Glucose-Capillary: 200 mg/dL — ABNORMAL HIGH (ref 70–99)
Glucose-Capillary: 245 mg/dL — ABNORMAL HIGH (ref 70–99)
Glucose-Capillary: 266 mg/dL — ABNORMAL HIGH (ref 70–99)

## 2023-02-11 LAB — MAGNESIUM
Magnesium: 1.8 mg/dL (ref 1.7–2.4)
Magnesium: 2.5 mg/dL — ABNORMAL HIGH (ref 1.7–2.4)

## 2023-02-11 LAB — CORTISOL: Cortisol, Plasma: 16.4 ug/dL

## 2023-02-11 LAB — APTT: aPTT: 57 s — ABNORMAL HIGH (ref 24–36)

## 2023-02-11 LAB — PATHOLOGIST SMEAR REVIEW

## 2023-02-11 LAB — PROCALCITONIN
Procalcitonin: <0.1 ng/mL
Procalcitonin: 1.79 ng/mL

## 2023-02-11 LAB — MRSA NEXT GEN BY PCR, NASAL: MRSA by PCR Next Gen: NOT DETECTED

## 2023-02-11 LAB — STREP PNEUMONIAE URINARY ANTIGEN: Strep Pneumo Urinary Antigen: NEGATIVE

## 2023-02-11 MED ORDER — IOHEXOL 350 MG/ML SOLN
75.0000 mL | Freq: Once | INTRAVENOUS | Status: AC | PRN
  Administered 2023-02-11: 75 mL via INTRAVENOUS

## 2023-02-11 MED ORDER — PANTOPRAZOLE SODIUM 40 MG IV SOLR
40.0000 mg | INTRAVENOUS | Status: DC
  Administered 2023-02-11 – 2023-02-12 (×2): 40 mg via INTRAVENOUS
  Filled 2023-02-11 (×2): qty 10

## 2023-02-11 MED ORDER — ROCURONIUM BROMIDE 10 MG/ML (PF) SYRINGE: 120.0000 mg | PREFILLED_SYRINGE | Freq: Once | INTRAVENOUS | Status: AC

## 2023-02-11 MED ORDER — FENTANYL CITRATE PF 50 MCG/ML IJ SOSY
25.0000 ug | PREFILLED_SYRINGE | INTRAMUSCULAR | Status: DC | PRN
  Administered 2023-02-11: 50 ug via INTRAVENOUS
  Filled 2023-02-11: qty 2

## 2023-02-11 MED ORDER — SODIUM CHLORIDE 0.9 % IV SOLN
2.0000 g | Freq: Two times a day (BID) | INTRAVENOUS | Status: DC
  Administered 2023-02-12 – 2023-02-13 (×3): 2 g via INTRAVENOUS
  Filled 2023-02-11 (×3): qty 12.5

## 2023-02-11 MED ORDER — CALCIUM GLUCONATE-NACL 1-0.675 GM/50ML-% IV SOLN
1.0000 g | Freq: Once | INTRAVENOUS | Status: AC
  Administered 2023-02-11: 1000 mg via INTRAVENOUS
  Filled 2023-02-11: qty 50

## 2023-02-11 MED ORDER — HEPARIN BOLUS VIA INFUSION
4000.0000 [IU] | Freq: Once | INTRAVENOUS | Status: AC
  Administered 2023-02-11: 4000 [IU] via INTRAVENOUS
  Filled 2023-02-11: qty 4000

## 2023-02-11 MED ORDER — VANCOMYCIN HCL 1.5 G IV SOLR: 1500.0000 mg | INTRAVENOUS | Status: DC

## 2023-02-11 MED ORDER — PROPOFOL 1000 MG/100ML IV EMUL
INTRAVENOUS | Status: AC
  Administered 2023-02-11: 5 ug/kg/min via INTRAVENOUS
  Filled 2023-02-11: qty 100

## 2023-02-11 MED ORDER — SODIUM CHLORIDE 0.9 % IV SOLN
2.0000 g | Freq: Once | INTRAVENOUS | Status: AC
  Administered 2023-02-11: 2 g via INTRAVENOUS
  Filled 2023-02-11: qty 12.5

## 2023-02-11 MED ORDER — DOXYCYCLINE HYCLATE 100 MG IV SOLR
100.0000 mg | Freq: Two times a day (BID) | INTRAVENOUS | Status: AC
  Administered 2023-02-12 – 2023-02-13 (×4): 100 mg via INTRAVENOUS
  Filled 2023-02-11 (×4): qty 100

## 2023-02-11 MED ORDER — FENTANYL CITRATE PF 50 MCG/ML IJ SOSY
25.0000 ug | PREFILLED_SYRINGE | INTRAMUSCULAR | Status: DC | PRN
Start: 2023-02-11 — End: 2023-02-12

## 2023-02-11 MED ORDER — ETOMIDATE 2 MG/ML IV SOLN: 20.0000 mg | Freq: Once | INTRAVENOUS | Status: AC

## 2023-02-11 MED ORDER — HEPARIN (PORCINE) 25000 UT/250ML-% IV SOLN
1300.0000 [IU]/h | INTRAVENOUS | Status: AC
  Administered 2023-02-11: 1000 [IU]/h via INTRAVENOUS
  Administered 2023-02-12: 1150 [IU]/h via INTRAVENOUS
  Administered 2023-02-13: 1300 [IU]/h via INTRAVENOUS
  Filled 2023-02-11 (×3): qty 250

## 2023-02-11 MED ORDER — DEXMEDETOMIDINE HCL IN NACL 400 MCG/100ML IV SOLN
0.0000 ug/kg/h | INTRAVENOUS | Status: DC
  Administered 2023-02-11: 0.8 ug/kg/h via INTRAVENOUS
  Administered 2023-02-11: 0.4 ug/kg/h via INTRAVENOUS
  Filled 2023-02-11 (×2): qty 100

## 2023-02-11 MED ORDER — MIDAZOLAM HCL 2 MG/2ML IJ SOLN
1.0000 mg | INTRAMUSCULAR | Status: DC | PRN
  Administered 2023-02-11: 2 mg via INTRAVENOUS
  Filled 2023-02-11 (×2): qty 2

## 2023-02-11 MED ORDER — VANCOMYCIN HCL IN DEXTROSE 1-5 GM/200ML-% IV SOLN
1000.0000 mg | Freq: Once | INTRAVENOUS | Status: AC
Start: 2023-02-11 — End: 2023-02-11
  Administered 2023-02-11: 1000 mg via INTRAVENOUS
  Filled 2023-02-11: qty 200

## 2023-02-11 MED ORDER — PROPOFOL 1000 MG/100ML IV EMUL
5.0000 ug/kg/min | INTRAVENOUS | Status: DC
  Administered 2023-02-11: 5 ug/kg/min via INTRAVENOUS

## 2023-02-11 MED ORDER — SODIUM CHLORIDE 0.9 % IV SOLN
2.0000 g | INTRAVENOUS | Status: DC
  Administered 2023-02-11: 2 g via INTRAVENOUS
  Filled 2023-02-11: qty 20

## 2023-02-11 MED ORDER — ROCURONIUM BROMIDE 10 MG/ML (PF) SYRINGE
PREFILLED_SYRINGE | INTRAVENOUS | Status: AC
  Administered 2023-02-11: 120 mg via INTRAVENOUS
  Filled 2023-02-11: qty 20

## 2023-02-11 MED ORDER — POTASSIUM CHLORIDE 10 MEQ/100ML IV SOLN
10.0000 meq | INTRAVENOUS | Status: AC
  Administered 2023-02-11 (×3): 10 meq via INTRAVENOUS
  Filled 2023-02-11 (×3): qty 100

## 2023-02-11 MED ORDER — PROPOFOL 1000 MG/100ML IV EMUL
0.0000 ug/kg/min | INTRAVENOUS | Status: DC
  Administered 2023-02-11 – 2023-02-12 (×4): 30 ug/kg/min via INTRAVENOUS
  Filled 2023-02-11 (×4): qty 100

## 2023-02-11 MED ORDER — DOCUSATE SODIUM 50 MG/5ML PO LIQD: 100.0000 mg | Freq: Two times a day (BID) | ORAL | Status: DC | PRN

## 2023-02-11 MED ORDER — MIDAZOLAM HCL 2 MG/2ML IJ SOLN
2.0000 mg | Freq: Once | INTRAMUSCULAR | Status: AC
  Administered 2023-02-11: 2 mg via INTRAVENOUS

## 2023-02-11 MED ORDER — MAGNESIUM SULFATE 2 GM/50ML IV SOLN
2.0000 g | Freq: Once | INTRAVENOUS | Status: AC
  Administered 2023-02-11: 2 g via INTRAVENOUS
  Filled 2023-02-11: qty 50

## 2023-02-11 MED ORDER — SODIUM CHLORIDE 0.9 % IV SOLN
250.0000 mL | INTRAVENOUS | Status: AC
  Administered 2023-02-11 (×2): 250 mL via INTRAVENOUS

## 2023-02-11 MED ORDER — ETOMIDATE 2 MG/ML IV SOLN
INTRAVENOUS | Status: AC
  Administered 2023-02-11: 20 mg via INTRAVENOUS
  Filled 2023-02-11: qty 10

## 2023-02-11 MED ORDER — SODIUM CHLORIDE 0.9 % IV SOLN
500.0000 mg | INTRAVENOUS | Status: DC
  Administered 2023-02-11: 500 mg via INTRAVENOUS
  Filled 2023-02-11: qty 5

## 2023-02-11 MED ORDER — HEPARIN SODIUM (PORCINE) 5000 UNIT/ML IJ SOLN: 5000.0000 [IU] | Freq: Three times a day (TID) | INTRAMUSCULAR | Status: DC

## 2023-02-11 MED ORDER — INSULIN ASPART 100 UNIT/ML IJ SOLN
0.0000 [IU] | INTRAMUSCULAR | Status: DC
  Administered 2023-02-11: 8 [IU] via SUBCUTANEOUS
  Administered 2023-02-11 – 2023-02-12 (×3): 3 [IU] via SUBCUTANEOUS
  Administered 2023-02-12: 5 [IU] via SUBCUTANEOUS
  Administered 2023-02-12 (×2): 3 [IU] via SUBCUTANEOUS
  Administered 2023-02-12 – 2023-02-13 (×3): 2 [IU] via SUBCUTANEOUS
  Administered 2023-02-13 (×2): 3 [IU] via SUBCUTANEOUS
  Administered 2023-02-14 (×2): 2 [IU] via SUBCUTANEOUS
  Administered 2023-02-14: 3 [IU] via SUBCUTANEOUS
  Administered 2023-02-14: 2 [IU] via SUBCUTANEOUS
  Administered 2023-02-14: 3 [IU] via SUBCUTANEOUS
  Administered 2023-02-14: 2 [IU] via SUBCUTANEOUS
  Administered 2023-02-14: 3 [IU] via SUBCUTANEOUS
  Administered 2023-02-15: 2 [IU] via SUBCUTANEOUS
  Administered 2023-02-15: 3 [IU] via SUBCUTANEOUS
  Administered 2023-02-15 (×2): 2 [IU] via SUBCUTANEOUS
  Administered 2023-02-16 – 2023-02-17 (×4): 3 [IU] via SUBCUTANEOUS
  Administered 2023-02-17: 2 [IU] via SUBCUTANEOUS
  Administered 2023-02-17 – 2023-02-18 (×3): 3 [IU] via SUBCUTANEOUS
  Administered 2023-02-18 – 2023-02-19 (×5): 2 [IU] via SUBCUTANEOUS
  Filled 2023-02-11 (×35): qty 1

## 2023-02-11 MED ORDER — POTASSIUM CHLORIDE 10 MEQ/100ML IV SOLN
10.0000 meq | Freq: Once | INTRAVENOUS | Status: AC
  Administered 2023-02-11: 10 meq via INTRAVENOUS
  Filled 2023-02-11: qty 100

## 2023-02-11 MED ORDER — NOREPINEPHRINE 4 MG/250ML-% IV SOLN
0.0000 ug/min | INTRAVENOUS | Status: DC
  Administered 2023-02-11: 6 ug/min via INTRAVENOUS
  Administered 2023-02-11 – 2023-02-12 (×2): 2 ug/min via INTRAVENOUS
  Filled 2023-02-11 (×3): qty 250

## 2023-02-11 MED ORDER — DOCUSATE SODIUM 50 MG/5ML PO LIQD
100.0000 mg | Freq: Two times a day (BID) | ORAL | Status: DC
  Administered 2023-02-11 – 2023-02-12 (×2): 100 mg
  Filled 2023-02-11 (×2): qty 10

## 2023-02-11 MED ORDER — POLYETHYLENE GLYCOL 3350 17 G PO PACK
17.0000 g | PACK | Freq: Every day | ORAL | Status: DC
  Administered 2023-02-12: 17 g
  Filled 2023-02-11: qty 1

## 2023-02-11 MED ORDER — VANCOMYCIN HCL IN DEXTROSE 1-5 GM/200ML-% IV SOLN
1000.0000 mg | Freq: Once | INTRAVENOUS | Status: AC
  Administered 2023-02-11: 1000 mg via INTRAVENOUS
  Filled 2023-02-11: qty 200

## 2023-02-11 MED ORDER — PROPOFOL 1000 MG/100ML IV EMUL: 5.0000 ug/kg/min | INTRAVENOUS | Status: DC

## 2023-02-11 MED ORDER — POLYETHYLENE GLYCOL 3350 17 G PO PACK: 17.0000 g | PACK | Freq: Every day | ORAL | Status: DC | PRN

## 2023-02-11 NOTE — H&P (Signed)
CRITICAL CARE    Name: [name] MRN: [id number] DOB: 11/30/1940     LOS: 0   SUBJECTIVE FINDINGS & SIGNIFICANT EVENTS    History of Presenting Illness:  This is a 82 year old female with a history of Essential hypertension, heart failure with preserved LVEF, Severe AS s/p TAVR, hypertension, diabetes type 2, diabetic ulcers, fatty liver disease, GERD, chronic dysphagia, history of colon polyps, was admitted and discharged earlier in dec 2024 with b/l LE cellulits and left toe infected ulcer with underlying fracture of phalanx and osteomyelitis. She apparently was on the way to hospital for routine appt and found to be hypoxemic in field.  She required intubation for severe hypoxemia in ED. Patient seen in ER with circulatory shock on levophed while intubated.  PCCM consultation for further evaluation and treatment.    Lines/tubes : Airway 7.5 mm (Active)  Secured at (cm) 24 cm 02/11/23 1018  Measured From Lips 02/11/23 1018  Secured Location Center 02/11/23 1018  Secured By [employer] 02/11/23 1018     NG/OG Vented/Dual Lumen 16 Fr. Oral Marking at nare/corner of mouth (Active)  Tube Position (Required) Marking at nare/corner of mouth 02/11/23 1022     Urethral Catheter Vet, RN Temperature probe 14 Fr. (Active)  Indication for Insertion or Continuance of Catheter Unstable critically ill patients first 24-48 hours (See Criteria) 02/11/23 1036  Site Assessment Clean, Dry, Intact 02/11/23 1036  Catheter Maintenance Bag below level of bladder;Catheter secured;Insertion date on drainage bag;Drainage bag/tubing not touching floor;No dependent loops;Seal intact 02/11/23 1036  Collection Container Standard drainage bag 02/11/23 1036     External Urinary Catheter (Active)    Microbiology/Sepsis  markers: Results for orders placed or performed during the hospital encounter of 01/22/22  Culture, blood (routine x 2)     Status: None   Collection Time: 01/22/22 10:21 AM   Specimen: Right Antecubital; Blood  Result Value Ref Range Status   Specimen Description RIGHT ANTECUBITAL  Final   Special Requests   Final    BOTTLES DRAWN AEROBIC AND ANAEROBIC Blood Culture adequate volume   Culture   Final    NO GROWTH 5 DAYS Performed at [facility], [address], [place], [place] [address]    Report Status 01/27/2022 FINAL  Final    Anti-infectives:  Anti-infectives (From admission, onward)    Start     Dose/Rate Route Frequency Ordered Stop   02/11/23 1415  cefTRIAXone (ROCEPHIN) 2 g in sodium chloride 0.9 % 100 mL IVPB        2 g 200 mL/hr over 30 Minutes Intravenous Every 24 hours 02/11/23 1402 02/16/23 1414   02/11/23 1415  azithromycin (ZITHROMAX) 500 mg in sodium chloride 0.9 % 250 mL IVPB        500 mg 250 mL/hr over 60 Minutes Intravenous Every 24 hours 02/11/23 1402 02/16/23 1414       PAST MEDICAL HISTORY   Past Medical History:  Diagnosis Date   Abnormal thyroid biopsy 2018   results were negative.   Anemia    "when I was alot younger" (12/06/2017)   Anxiety    self reported   Arthritis    "almost all over; used to cry w/it when I was in my teens" (12/06/2017)   CHF (congestive heart failure) (HCC)    Chronic back pain    "all over" (12/06/2017)   Depression    "lost my son last year to cancer; I tended to him; he lived w/me" (  12/06/2017)   Diverticulosis of colon (without mention of hemorrhage)    External hemorrhoids without mention of complication    Fatty liver disease, nonalcoholic    Fibromyalgia    History of kidney stones    History of stomach ulcers 1970   Hyperlipemia    Hypertension    IBS (irritable bowel syndrome)    Morbid obesity (HCC)    Psoriasis    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   26 mm  Medtronic Evolut Pro transcatheter heart valve placed via percutaneous right transfemoral approach    Severe aortic stenosis    Sinus headache    Sleep apnea    "was told I do; never have had any problems w/it" (12/06/2017)   Type II diabetes mellitus (HCC)      SURGICAL HISTORY   Past Surgical History:  Procedure Laterality Date   ABDOMINAL ADHESION SURGERY     "took 2 out"   ABDOMINAL HYSTERECTOMY     AMPUTATION Left 01/23/2022   Procedure: AMPUTATION DIGIT;  Surgeon: Candelaria Stagers, DPM;  Location: [facility];  Service: Podiatry;  Laterality: Left;   AMPUTATION TOE Right 02/12/2020   Procedure: Right hallux and second toe amputation;  Surgeon: Toni Arthurs, MD;  Location: [facility];  Service: Orthopedics;  Laterality: Right;   APPENDECTOMY     BACK SURGERY     BIOPSY  08/05/2020   Procedure: BIOPSY;  Surgeon: Charlott Rakes, MD;  Location: [facility];  Service: Endoscopy;;   BIOPSY THYROID  2018   results were negative.   BREAST LUMPECTOMY Left X 1   "benign"   CATARACT EXTRACTION W/ INTRAOCULAR LENS  IMPLANT, BILATERAL Bilateral    CATARACT EXTRACTION W/ INTRAOCULAR LENS IMPLANT Bilateral 2017   COLONOSCOPY WITH PROPOFOL N/A 08/05/2020   Procedure: COLONOSCOPY WITH PROPOFOL;  Surgeon: Charlott Rakes, MD;  Location: [facility];  Service: Endoscopy;  Laterality: N/A;   ESOPHAGOGASTRODUODENOSCOPY (EGD) WITH PROPOFOL N/A 08/05/2020   Procedure: ESOPHAGOGASTRODUODENOSCOPY (EGD) WITH PROPOFOL;  Surgeon: Charlott Rakes, MD;  Location: [facility];  Service: Endoscopy;  Laterality: N/A;   EYE SURGERY     HEMORRHOID BANDING     LAPAROSCOPIC CHOLECYSTECTOMY     LEFT AND RIGHT HEART CATHETERIZATION WITH CORONARY ANGIOGRAM N/A 11/15/2013   Procedure: LEFT AND RIGHT HEART CATHETERIZATION WITH CORONARY ANGIOGRAM;  Surgeon: Micheline Chapman, MD;  Location: [facility];  Service: Cardiovascular;  Laterality: N/A;   LUMBAR DISC SURGERY     POLYPECTOMY  08/05/2020    Procedure: POLYPECTOMY;  Surgeon: Charlott Rakes, MD;  Location: [facility];  Service: Endoscopy;;   RETINAL DETACHMENT SURGERY Right    RIGHT/LEFT HEART CATH AND CORONARY ANGIOGRAPHY N/A 11/02/2017   Procedure: RIGHT/LEFT HEART CATH AND CORONARY ANGIOGRAPHY;  Surgeon: Kathleene Hazel, MD;  Location: [facility];  Service: Cardiovascular;  Laterality: N/A;   TEE WITHOUT CARDIOVERSION N/A 12/06/2017   Procedure: TRANSESOPHAGEAL ECHOCARDIOGRAM (TEE);  Surgeon: Tonny Bollman, MD;  Location: [facility] OR;  Service: Open Heart Surgery;  Laterality: N/A;   TONSILLECTOMY     TRANSCATHETER AORTIC VALVE REPLACEMENT, TRANSFEMORAL  12/06/2017   TRANSCATHETER AORTIC VALVE REPLACEMENT, TRANSFEMORAL N/A 12/06/2017   Procedure: TRANSCATHETER AORTIC VALVE REPLACEMENT, TRANSFEMORAL;  Surgeon: Tonny Bollman, MD;  Location: [facility] OR;  Service: Open Heart Surgery;  Laterality: N/A;   TRANSESOPHAGEAL ECHOCARDIOGRAM  12/06/2017     FAMILY HISTORY   Family History  Problem Relation Age of Onset   Lung cancer Mother    Diabetes Maternal Grandmother  Heart disease Maternal Grandfather    Colon cancer Neg Hx      SOCIAL HISTORY   Social History   Tobacco Use   Smoking status: Former    Current packs/day: 0.00    Average packs/day: 1.8 packs/day for 32.0 years (56.0 ttl pk-yrs)    Types: Cigarettes    Start date: 28    Quit date: 2002    Years since quitting: 22.9   Smokeless tobacco: Never  Vaping Use   Vaping status: Never Used  Substance Use Topics   Alcohol use: No   Drug use: Never     MEDICATIONS   Current Medication:  Current Facility-Administered Medications:    0.9 %  sodium chloride infusion, 250 mL, Intravenous, Continuous, Chesley Noon, MD   azithromycin (ZITHROMAX) 500 mg in sodium chloride 0.9 % 250 mL IVPB, 500 mg, Intravenous, Q24H, Chesley Noon, MD   calcium gluconate 1 g/ 50 mL sodium chloride IVPB, 1 g, Intravenous, Once, Chesley Noon, MD, Last  Rate: 50 mL/hr at 02/11/23 1330, 1,000 mg at 02/11/23 1330   cefTRIAXone (ROCEPHIN) 2 g in sodium chloride 0.9 % 100 mL IVPB, 2 g, Intravenous, Q24H, Chesley Noon, MD   dexmedetomidine (PRECEDEX) 400 MCG/100ML (4 mcg/mL) infusion, 0-1.2 mcg/kg/hr, Intravenous, Continuous, Chesley Noon, MD, Last Rate: 12.54 mL/hr at 02/11/23 1209, 0.4 mcg/kg/hr at 02/11/23 1209   docusate sodium (COLACE) capsule 100 mg, 100 mg, Oral, BID PRN, Vida Rigger, MD   fentaNYL (SUBLIMAZE) injection 25 mcg, 25 mcg, Intravenous, Q15 min PRN, Chesley Noon, MD   fentaNYL (SUBLIMAZE) injection 25-100 mcg, 25-100 mcg, Intravenous, Q30 min PRN, Chesley Noon, MD   heparin injection 5,000 Units, 5,000 Units, Subcutaneous, Q8H, Karna Christmas, Vegas Fritze, MD   midazolam (VERSED) injection 1-2 mg, 1-2 mg, Intravenous, Q1H PRN, Chesley Noon, MD   norepinephrine (LEVOPHED) 4mg  in (0.016 mg/mL) premix infusion, 2-10 mcg/min, Intravenous, Titrated, Chesley Noon, MD, Last Rate: 7.5 mL/hr at 02/11/23 1405, 2 mcg/min at 02/11/23 1405   pantoprazole (PROTONIX) injection 40 mg, 40 mg, Intravenous, Q24H, Teagon Kron, MD   polyethylene glycol (MIRALAX / GLYCOLAX) packet 17 g, 17 g, Oral, Daily PRN, Karna Christmas, Temesha Queener, MD   potassium chloride 10 mEq in 100 mL IVPB, 10 mEq, Intravenous, Q1 Hr x 4, Chesley Noon, MD, Last Rate: 100 mL/hr at 02/11/23 1334, 10 mEq at 02/11/23 1334  Current Outpatient Medications:    acetaminophen (TYLENOL) 325 MG tablet, Take 2 tablets (650 mg total) by mouth every 6 (six) hours as needed for mild pain, fever or headache., Disp: , Rfl:    Cholecalciferol (VITAMIN D-3) 5000 units TABS, Take 5,000 Units by mouth daily., Disp: , Rfl:    Cranberry 250 MG TABS, Take 250 mg by mouth daily., Disp: , Rfl:    enalapril (VASOTEC) 10 MG tablet, Take 10 mg by mouth daily., Disp: , Rfl:    furosemide (LASIX) 80 MG tablet, Take 1 tablet (80 mg total) by mouth daily. (Patient taking differently: Take 80 mg by  mouth 2 (two) times daily.), Disp: 90 tablet, Rfl: 3   loratadine (CLARITIN) 10 MG tablet, Take 10 mg by mouth daily as needed., Disp: , Rfl:    metFORMIN (GLUCOPHAGE) 1000 MG tablet, Take 1,000 mg by mouth 2 (two) times daily., Disp: , Rfl:    ondansetron (ZOFRAN) 4 MG tablet, Take 1 tablet (4 mg total) by mouth every 6 (six) hours as needed for nausea., Disp: 20 tablet, Rfl: 0   oxyCODONE-acetaminophen (PERCOCET/ROXICET) 5-325 MG tablet, Take 1-2 tablets  by mouth every 4 (four) hours as needed for moderate pain or severe pain., Disp: 20 tablet, Rfl: 0   potassium chloride SA (K-DUR,KLOR-CON) 20 MEQ tablet, Take 2 tablets (40 mEq total) by mouth daily. (Patient taking differently: Take 40 mEq by mouth 2 (two) times daily.), Disp: 180 tablet, Rfl: 3    ALLERGIES   Castor oil, Sulfonamide derivatives, Amoxicillin, Demerol [meperidine], and Hydrocodone    REVIEW OF SYSTEMS     Unable to obtain ROS due to mechanical ventilation.   PHYSICAL EXAMINATION   Vital Signs: Temp:  [93.6 F (34.2 C)-97.9 F (36.6 C)] 97.5 F (36.4 C) (12/20 1400) Pulse Rate:  [64-154] 66 (12/20 1400) Resp:  [11-25] 21 (12/20 1400) BP: (78-170)/(51-125) 91/58 (12/20 1400) SpO2:  [21 %-100 %] 100 % (12/20 1400) FiO2 (%):  [75 %] 75 % (12/20 1020) Weight:  [125.4 kg] 125.4 kg (12/20 1200)  GENERAL:sedated on MV HEAD: Normocephalic, atraumatic.  EYES: Pupils equal, round, reactive to light.  No scleral icterus.  MOUTH: Moist mucosal membrane. NECK: Supple. No thyromegaly. No nodules. No JVD.  PULMONARY: wheezing and rhonchi CARDIOVASCULAR: S1 and S2. Regular rate and rhythm. No murmurs, rubs, or gallops.  GASTROINTESTINAL: Soft, nontender, non-distended. No masses. Positive bowel sounds. No hepatosplenomegaly.  MUSCULOSKELETAL: No swelling, clubbing, or edema.  NEUROLOGIC: Mild distress due to acute illness SKIN:intact,warm,dry   PERTINENT DATA     Infusions:  sodium chloride     azithromycin      calcium gluconate 1,000 mg (02/11/23 1330)   cefTRIAXone (ROCEPHIN)  IV     dexmedetomidine (PRECEDEX) IV infusion 0.4 mcg/kg/hr (02/11/23 1209)   norepinephrine (LEVOPHED) Adult infusion 2 mcg/min (02/11/23 1405)   potassium chloride 10 mEq (02/11/23 1334)   Scheduled Medications:  heparin  5,000 Units Subcutaneous Q8H   pantoprazole (PROTONIX) IV  40 mg Intravenous Q24H   PRN Medications: docusate sodium, fentaNYL (SUBLIMAZE) injection, fentaNYL (SUBLIMAZE) injection, midazolam, polyethylene glycol Hemodynamic parameters:   Intake/Output: No intake/output data recorded.  Ventilator  Settings: Vent Mode: AC FiO2 (%):  [75 %] 75 % Set Rate:  [20 bmp] 20 bmp Vt Set:  [450 mL] 450 mL PEEP:  [8 cmH20] 8 cmH20   LAB RESULTS:  Basic Metabolic Panel: Recent Labs  Lab 02/11/23 1134  NA 139  K 2.9*  CL 111  CO2 21*  GLUCOSE 270*  BUN 21  CREATININE 1.11*  CALCIUM 6.1*  MG 1.8   Liver Function Tests: Recent Labs  Lab 02/11/23 1134  AST 33  ALT 16  ALKPHOS 65  BILITOT 0.5  PROT 5.2*  ALBUMIN 2.2*   No results for input(s): "LIPASE", "AMYLASE" in the last 168 hours. No results for input(s): "AMMONIA" in the last 168 hours. CBC: Recent Labs  Lab 02/11/23 1023  WBC 28.5*  NEUTROABS 8.2*  HGB 11.9*  HCT 41.6  MCV 97.9  PLT 195   Cardiac Enzymes: No results for input(s): "CKTOTAL", "CKMB", "CKMBINDEX", "TROPONINI" in the last 168 hours. BNP: Invalid input(s): "POCBNP" CBG: Recent Labs  Lab 02/11/23 1011  GLUCAP 242*       IMAGING RESULTS:     ASSESSMENT AND PLAN    -Multidisciplinary rounds held today  Acute Hypoxic Respiratory Failure -continue Full MV support -continue Bronchodilator Therapy -Wean Fio2 and PEEP as tolerated -will perform SAT/SBT when respiratory parameters are met -RVP and COVID/flu/RSV testing  -tracheal aspirate  -BNP -procalcitonin trend -MRSA PCR   Bilateral LE edema with cellulits S/p treatment last  admission  -podiatry evaluation  -  currently erythematous indurated bilateral swelling suspicious for cellulitis   Crhonic Renal Failure- stage 2 -follow chem 7 -follow UO -continue Foley Catheter-assess need daily   NEUROLOGY - intubated and sedated - minimal sedation to achieve a RASS goal: -1 Wake up assessment pending   Septic shock- present on admission Suspect possible osteomeylitis/cellulits -use vasopressors to keep MAP>65 -follow ABG and LA -follow up cultures -emperic ABX -consider stress dose steroids   ID -continue IV abx as prescibed -follow up cultures  GI/Nutrition GI PROPHYLAXIS as indicated DIET-->TF's as tolerated Constipation protocol as indicated  ENDO - ICU hypoglycemic\Hyperglycemia protocol -check FSBS per protocol   ELECTROLYTES -follow labs as needed -replace as needed -pharmacy consultation   DVT/GI PRX ordered -SCDs  TRANSFUSIONS AS NEEDED MONITOR FSBS ASSESS the need for LABS as needed    Critical care provider statement:   Total critical care time: 68 minutes   Performed by: Karna Christmas MD   Critical care time was exclusive of separately billable procedures and treating other patients.   Critical care was necessary to treat or prevent imminent or life-threatening deterioration.   Critical care was time spent personally by me on the following activities: development of treatment plan with patient and/or surrogate as well as nursing, discussions with consultants, evaluation of patient's response to treatment, examination of patient, obtaining history from patient or surrogate, ordering and performing treatments and interventions, ordering and review of laboratory studies, ordering and review of radiographic studies, pulse oximetry and re-evaluation of patient's condition.    Vida Rigger, M.D.  Pulmonary & Critical Care Medicine

## 2023-02-11 NOTE — ED Notes (Signed)
Dr. Larinda Buttery made aware of pt's BP.  Levophed ordrred and given per order

## 2023-02-11 NOTE — ED Notes (Signed)
Friend at bedside stating "I was driving her to her doctors appointment and she just got short of breath so I brought her here." Encouraged friend to call patient's son. Friend left ED room and states he will continue to try.

## 2023-02-11 NOTE — ED Notes (Signed)
RT at bedside.

## 2023-02-11 NOTE — Progress Notes (Signed)
Patent accepted from ED sedated and intubated.  VSS on levophed drip.  Assessment complete, see flowsheets.  No family present at this time.

## 2023-02-11 NOTE — Plan of Care (Signed)

## 2023-02-11 NOTE — ED Notes (Signed)
Pt's BP cont to trend down on current medication, propofol.  Dr. Larinda Buttery informed and medication d/c'd per order and awaiting to hang precedex per order

## 2023-02-11 NOTE — Progress Notes (Signed)
CODE SEPSIS - PHARMACY COMMUNICATION  **Broad Spectrum Antibiotics should be administered within 1 hour of Sepsis diagnosis**  Time Code Sepsis Called/Page Received: 1403  Antibiotics Ordered: Ceftriaxone & azithromycin  Time of 1st antibiotic administration: 1503  Additional action taken by pharmacy: N/A  Tressie Ellis 02/11/2023  2:51 PM

## 2023-02-11 NOTE — ED Notes (Signed)
FiO2 75% PEEP 8 Rate 20

## 2023-02-11 NOTE — ED Notes (Signed)
Patient began to desat, attempting to ventilate patient with ambu bag; RT called.

## 2023-02-11 NOTE — ED Notes (Signed)
7.5 tube, 23 at the lip. Resp at bedside with EDP

## 2023-02-11 NOTE — ED Triage Notes (Signed)
Pt arrive to ED in resp distress. Lips blue in color. Pt not able to talk.

## 2023-02-11 NOTE — Progress Notes (Signed)
PHARMACY - ANTICOAGULATION CONSULT NOTE  Pharmacy Consult for Heparin Infusion Indication: chest pain/ACS  Allergies  Allergen Reactions   Castor Oil     "passed out" ? SYNCOPE ?   Sulfonamide Derivatives     Other Reaction(s): Not available   Amoxicillin Diarrhea and Other (See Comments)    Has patient had a PCN reaction causing immediate rash, facial/tongue/throat swelling, SOB or lightheadedness with hypotension: No Has patient had a PCN reaction causing severe rash involving mucus membranes or skin necrosis: No Has patient had a PCN reaction that required hospitalization: No Has patient had a PCN reaction occurring within the last 10 years: No If all of the above answers are "NO", then may proceed with Cephalosporin use.    Demerol [Meperidine] Nausea And Vomiting   Hydrocodone Nausea And Vomiting    Patient Measurements: Height: 5\' 2"  (157.5 cm) Weight: 131.6 kg (290 lb 2 oz) IBW/kg (Calculated) : 50.1 Heparin Dosing Weight: 83.3 kg  Vital Signs: Temp: 97.9 F (36.6 C) (12/20 1800) Temp Source: Bladder (12/20 1800) BP: 99/70 (12/20 1600) Pulse Rate: 55 (12/20 1800)  Labs: Recent Labs    02/11/23 1023 02/11/23 1134 02/11/23 1216 02/11/23 1757  HGB 11.9*  --   --   --   HCT 41.6  --   --   --   PLT 195  --   --   --   CREATININE  --  1.11*  --   --   TROPONINIHS 45*  --  94* 1,035*    Estimated Creatinine Clearance: 52.8 mL/min (A) (by C-G formula based on SCr of 1.11 mg/dL (H)).   Medical History: Past Medical History:  Diagnosis Date   Abnormal thyroid biopsy 2018   results were negative.   Anemia    "when I was alot younger" (12/06/2017)   Anxiety    self reported   Arthritis    "almost all over; used to cry w/it when I was in my teens" (12/06/2017)   CHF (congestive heart failure) (HCC)    Chronic back pain    "all over" (12/06/2017)   Depression    "lost my son last year to cancer; I tended to him; he lived w/me" (12/06/2017)    Diverticulosis of colon (without mention of hemorrhage)    External hemorrhoids without mention of complication    Fatty liver disease, nonalcoholic    Fibromyalgia    History of kidney stones    History of stomach ulcers 1970   Hyperlipemia    Hypertension    IBS (irritable bowel syndrome)    Morbid obesity (HCC)    Psoriasis    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 She was found to have an elevated troponin level of 1,035. Pharmacy was consulted to initiate patient on heparin infusion for ACS/STEMI. Patient was not on anticoagulation prior to arrival.   Baseline INR and aPTT ordered.  No signs/symptoms of bleeding noted in chart. Hgb  11.9. PLT 195.  Goal of Therapy:  Heparin level 0.3-0.7 units/ml Monitor platelets by anticoagulation protocol: Yes   Plan:  Give 4000 units bolus x 1 Start heparin infusion at 1000 units/hr Check anti-Xa level in 8 hours and daily while on heparin Continue to monitor H&H and platelets  Merryl Hacker, PharmD Clinical Pharmacist 02/11/2023,6:57 PM

## 2023-02-11 NOTE — Procedures (Signed)
Arterial Catheter Insertion Procedure Note  BERNADINA WIDER  [id number]  1940-06-11  Date:02/11/23  Time:5:06 PM    Provider Performing: Judithe Modest    Procedure: Insertion of Arterial Line (29528) with US guidance (41324)   Indication(s) Blood pressure monitoring and/or need for frequent ABGs  Consent Unable to obtain consent due to emergent nature of procedure.  Anesthesia None   Time Out Verified patient identification, verified procedure, site/side was marked, verified correct patient position, special equipment/implants available, medications/allergies/relevant history reviewed, required imaging and test results available.   Sterile Technique Maximal sterile technique including full sterile barrier drape, hand hygiene, sterile gown, sterile gloves, mask, hair covering, sterile ultrasound probe cover (if used).   Procedure Description Area of catheter insertion was cleaned with chlorhexidine and draped in sterile fashion. With real-time ultrasound guidance an arterial catheter was placed into the left radial artery.  Appropriate arterial tracings confirmed on monitor.     Complications/Tolerance None; patient tolerated the procedure well.   EBL Minimal   Specimen(s) None  BIOPATCH applied to the insertion site.    Harlon Ditty, AGACNP-BC [facility] Prefer epic messenger for cross cover needs If after hours, please call E-link

## 2023-02-11 NOTE — Progress Notes (Signed)
Pharmacy Antibiotic Note  [name] is a 82 y.o. female admitted on 02/11/2023 with cellulitis and left toe infected ulcer .  Pharmacy has been consulted for Cefepime and Vancomycin dosing.  Plan: Vancomycin 2000 mg IV loading dose Vancomycin 1500 mg IV Q 36 hrs. Goal AUC 400-550. Expected AUC: 491.3 SCr used: 1.11 Expected Cmin: 11.7  Cefepime 2g IV q12h   Height: 5\' 2"  (157.5 cm) Weight: 131.6 kg (290 lb 2 oz) IBW/kg (Calculated) : 50.1  Temp (24hrs), Avg:97.1 F (36.2 C), Min:93.6 F (34.2 C), Max:97.9 F (36.6 C)  Recent Labs  Lab 02/11/23 1023 02/11/23 1024 02/11/23 1134 02/11/23 1216 02/11/23 1424 02/11/23 1757  WBC 28.5*  --   --   --   --   --   CREATININE  --   --  1.11*  --   --   --   LATICACIDVEN  --  >9.0*  --  4.1* 2.7* 1.2    Estimated Creatinine Clearance: 52.8 mL/min (A) (by C-G formula based on SCr of 1.11 mg/dL (H)).    Allergies  Allergen Reactions   Castor Oil     "passed out" ? SYNCOPE ?   Sulfonamide Derivatives     Other Reaction(s): Not available   Amoxicillin Diarrhea and Other (See Comments)    Has patient had a PCN reaction causing immediate rash, facial/tongue/throat swelling, SOB or lightheadedness with hypotension: No Has patient had a PCN reaction causing severe rash involving mucus membranes or skin necrosis: No Has patient had a PCN reaction that required hospitalization: No Has patient had a PCN reaction occurring within the last 10 years: No If all of the above answers are "NO", then may proceed with Cephalosporin use.    Demerol [Meperidine] Nausea And Vomiting   Hydrocodone Nausea And Vomiting    Antimicrobials this admission: Cefepime 12/20 >>  Vancomycin 12/20 >>   Dose adjustments this admission:   Microbiology results:  Thank you for allowing pharmacy to be a part of this patient's care.  Clovia Cuff, PharmD, BCPS 02/11/2023 6:48 PM

## 2023-02-11 NOTE — Procedures (Signed)
Central Venous Catheter Insertion Procedure Note  [name]  [id number]  09-Mar-1940  Date:02/11/23  Time:5:05 PM   Provider Performing:Hendrick Pavich D Elvina Sidle   Procedure: Insertion of Non-tunneled Central Venous Catheter(36556) with US guidance (40981)   Indication(s) Medication administration and Difficult access  Consent Unable to obtain consent due to emergent nature of procedure.  Anesthesia Topical only with 1% lidocaine   Timeout Verified patient identification, verified procedure, site/side was marked, verified correct patient position, special equipment/implants available, medications/allergies/relevant history reviewed, required imaging and test results available.  Sterile Technique Maximal sterile technique including full sterile barrier drape, hand hygiene, sterile gown, sterile gloves, mask, hair covering, sterile ultrasound probe cover (if used).  Procedure Description Area of catheter insertion was cleaned with chlorhexidine and draped in sterile fashion.  With real-time ultrasound guidance a central venous catheter was placed into the left internal jugular vein. Nonpulsatile blood flow and easy flushing noted in all ports.  The catheter was sutured in place and sterile dressing applied.  Complications/Tolerance None; patient tolerated the procedure well. Chest X-ray is ordered to verify placement for internal jugular or subclavian cannulation.   Chest x-ray is not ordered for femoral cannulation.  EBL Minimal  Specimen(s) None   Line inserted to the 20 cm mark. BIOPATCH applied     Harlon Ditty, AGACNP-BC [facility] Prefer epic messenger for cross cover needs If after hours, please call E-link

## 2023-02-11 NOTE — Consult Note (Signed)
PHARMACY CONSULT NOTE - ELECTROLYTES  Pharmacy Consult for Electrolyte Monitoring and Replacement   Recent Labs: Potassium (mmol/L)  Date Value  02/11/2023 5.0   Magnesium (mg/dL)  Date Value  16/11/9602 2.5 (H)   Calcium (mg/dL)  Date Value  54/10/8117 7.7 (L)   Albumin (g/dL)  Date Value  14/78/2956 2.2 (L)   Sodium (mmol/L)  Date Value  02/11/2023 134 (L)  10/26/2017 142   Corrected Ca: 9.9 mg/dL  Height: 5\' 2"  (157.5 cm) Weight: 131.6 kg (290 lb 2 oz) IBW/kg (Calculated) : 50.1 Estimated Creatinine Clearance: 41.8 mL/min (A) (by C-G formula based on SCr of 1.4 mg/dL (H)).  Assessment  [name] is a 82 y.o. female presenting with respiratory distress / PEA arrest / septic shock requiring vasopressors. PMH significant for hypertension, diabetes, CHF, aortic stenosis status post TAVR, and nonalcoholic fatty liver disease . Pharmacy has been consulted to monitor and replace electrolytes.  Patient is intubated, sedated, on mechanical ventilation  Diet: NPO MIVF: N/A, holding off on fluids given suspected CHF Pertinent medications: N/A  Goal of Therapy:  K >= 4, Mg >= 2  Plan:  No additional supplementation needed at this time Re-check BMP, Mg, and Phos with AM labs  Thank you for allowing pharmacy to be a part of this patient's care.  Merryl Hacker, PharmD Clinical Pharmacist 02/11/2023 8:28 PM

## 2023-02-11 NOTE — Sepsis Progress Note (Signed)
eLink is following this Code Sepsis. °

## 2023-02-11 NOTE — ED Triage Notes (Addendum)
First nurse note: Transport came into to ED for assistance to get pt out of car for SOB. Staff outside with wheelchair to assist. Staff getting pt out of car and pt answering questions but pt pale and lips are blue. Charge called by other RN. Pt reports oxygen use at home but has ran out. Pt went unresponsive in wheelchair and went into respiratory arrest. Pt rushed back to room 3

## 2023-02-11 NOTE — ED Notes (Signed)
CPR intiated: 0957 EPT-1003 Pulse check-1006 ROSC-1006

## 2023-02-11 NOTE — ED Provider Notes (Addendum)
[facility] Provider Note    Event Date/Time   First MD Initiated Contact with Patient 02/11/23 1019     (approximate)   History   Chief Complaint Respiratory Distress   HPI  [name] is a 82 y.o. female with past medical history of hypertension, diabetes, CHF, aortic stenosis status post TAVR, and nonalcoholic fatty liver disease who presents to the ED for respiratory distress.  Patient reportedly was on her way to the hospital for a regular appointment with a transport service.  Patient became increasingly short of breath during transport and on arrival, was noted to be in respiratory distress.  The transport Zenaida Niece brought her to the front of the emergency department, where she was able to be loaded onto a wheelchair and taken to triage.  Shortly after arrival in triage, patient became unresponsive with no palpable pulse, CPR initiated as patient brought back to a room.     Physical Exam   Triage Vital Signs: ED Triage Vitals  Encounter Vitals Group     BP 02/11/23 1007 (!) 161/124     Systolic BP Percentile --      Diastolic BP Percentile --      Pulse Rate 02/11/23 1008 (!) 123     Resp 02/11/23 1007 (!) 21     Temp --      Temp src --      SpO2 02/11/23 1008 (!) 21 %     Weight --      Height --      Head Circumference --      Peak Flow --      Pain Score --      Pain Loc --      Pain Education --      Exclude from Growth Chart --     Most recent vital signs: Vitals:   02/11/23 1300 02/11/23 1315  BP: 122/75   Pulse: 85 72  Resp: 19 (!) 23  Temp: (!) 97.4 F (36.3 C) (!) 97.4 F (36.3 C)  SpO2: 100% 100%    Constitutional: Unresponsive, appears cyanotic. Eyes: Conjunctivae are normal. Head: Atraumatic. Nose: No congestion/rhinnorhea. Mouth/Throat: Mucous membranes are moist.  Cardiovascular: No palpable pulses. Respiratory: Apneic. Gastrointestinal: Soft and nontender. No distention. Musculoskeletal: No lower extremity  tenderness nor edema.  Neurologic: No spontaneous movements.    ED Results / Procedures / Treatments   Labs (all labs ordered are listed, but only abnormal results are displayed) Labs Reviewed  BLOOD GAS, VENOUS - Abnormal; Notable for the following components:      Result Value   pH, Ven 6.99 (*)    pCO2, Ven 93 (*)    pO2, Ven <31 (*)    Acid-base deficit 11.0 (*)    All other components within normal limits  CBC WITH DIFFERENTIAL/PLATELET - Abnormal; Notable for the following components:   WBC 28.5 (*)    Hemoglobin 11.9 (*)    MCHC 28.6 (*)    nRBC 0.3 (*)    Neutro Abs 8.2 (*)    Lymphs Abs 17.2 (*)    Monocytes Absolute 1.7 (*)    Eosinophils Absolute 0.6 (*)    Basophils Absolute 0.2 (*)    Abs Immature Granulocytes 0.60 (*)    All other components within normal limits  BRAIN NATRIURETIC PEPTIDE - Abnormal; Notable for the following components:   B Natriuretic Peptide 1,124.0 (*)    All other components within normal limits  LACTIC ACID, PLASMA -  Abnormal; Notable for the following components:   Lactic Acid, Venous >9.0 (*)    All other components within normal limits  LACTIC ACID, PLASMA - Abnormal; Notable for the following components:   Lactic Acid, Venous 4.1 (*)    All other components within normal limits  COMPREHENSIVE METABOLIC PANEL - Abnormal; Notable for the following components:   Potassium 2.9 (*)    CO2 21 (*)    Glucose, Bld 270 (*)    Creatinine, Ser 1.11 (*)    Calcium 6.1 (*)    Total Protein 5.2 (*)    Albumin 2.2 (*)    GFR, Estimated 50 (*)    All other components within normal limits  BLOOD GAS, VENOUS - Abnormal; Notable for the following components:   pH, Ven 7.14 (*)    pO2, Ven 52 (*)    Bicarbonate 17.7 (*)    Acid-base deficit 11.5 (*)    All other components within normal limits  CBG MONITORING, ED - Abnormal; Notable for the following components:   Glucose-Capillary 242 (*)    All other components within normal limits   TROPONIN I (HIGH SENSITIVITY) - Abnormal; Notable for the following components:   Troponin I (High Sensitivity) 45 (*)    All other components within normal limits  TROPONIN I (HIGH SENSITIVITY) - Abnormal; Notable for the following components:   Troponin I (High Sensitivity) 94 (*)    All other components within normal limits  CULTURE, BLOOD (ROUTINE X 2)  CULTURE, BLOOD (ROUTINE X 2)  SARS CORONAVIRUS 2 BY RT PCR  RESPIRATORY PANEL BY PCR  MAGNESIUM  PATHOLOGIST SMEAR REVIEW  PROCALCITONIN  URINALYSIS, COMPLETE (UACMP) WITH MICROSCOPIC  URINE DRUG SCREEN, QUALITATIVE ([facility] ONLY)     EKG  ED ECG REPORT I, Chesley Noon, the attending physician, personally viewed and interpreted this ECG.   Date: 02/11/2023  EKG Time: 10:18  Rate: 138  Rhythm: Ectopic atrial tachycardia  Axis: Normal  Intervals:left bundle branch block  ST&T Change: None  RADIOLOGY Chest x-ray reviewed and interpreted by me with appropriately positioned ET tube, pulmonary edema noted without focal infiltrate.  PROCEDURES:  Critical Care performed: Yes, see critical care procedure note(s)  Procedure Name: Intubation Date/Time: 02/11/2023 10:28 AM  Performed by: Chesley Noon, MDPre-anesthesia Checklist: Patient identified, Patient being monitored, Emergency Drugs available, Timeout performed and Suction available Oxygen Delivery Method: Non-rebreather mask Preoxygenation: Pre-oxygenation with 100% oxygen Induction Type: Rapid sequence Ventilation: Mask ventilation without difficulty Laryngoscope Size: Glidescope and 4 Grade View: Grade I Tube size: 7.5 mm Number of attempts: 1 Airway Equipment and Method: Video-laryngoscopy Placement Confirmation: ETT inserted through vocal cords under direct vision, CO2 detector and Breath sounds checked- equal and bilateral Secured at: 23 cm Tube secured with: ETT holder Dental Injury: Teeth and Oropharynx as per pre-operative assessment         MEDICATIONS ORDERED IN ED: Medications  dexmedetomidine (PRECEDEX) 400 MCG/100ML (4 mcg/mL) infusion (0.4 mcg/kg/hr  125.4 kg Intravenous New Bag/Given 02/11/23 1209)  midazolam (VERSED) injection 1-2 mg (has no administration in time range)  fentaNYL (SUBLIMAZE) injection 25 mcg (has no administration in time range)  fentaNYL (SUBLIMAZE) injection 25-100 mcg (has no administration in time range)  calcium gluconate 1 g/ 50 mL sodium chloride IVPB (has no administration in time range)  potassium chloride 10 mEq in 100 mL IVPB (has no administration in time range)  etomidate (AMIDATE) injection 20 mg (20 mg Intravenous Given 02/11/23 1009)  rocuronium (ZEMURON) injection 120 mg (120 mg  Intravenous Given 02/11/23 1009)  iohexol (OMNIPAQUE) 350 MG/ML injection 75 mL (75 mLs Intravenous Contrast Given 02/11/23 1300)     IMPRESSION / MDM / ASSESSMENT AND PLAN / ED COURSE  I reviewed the triage vital signs and the nursing notes.                              82 y.o. female with past medical history of hypertension, diabetes, CHF, aortic stenosis status post TAVR, and nonalcoholic fatty liver disease who presents to the ED initially in respiratory distress, subsequently had cardiac arrest just after entering the ED.  Patient's presentation is most consistent with acute presentation with potential threat to life or bodily function.  Differential diagnosis includes, but is not limited to, ACS, PE, hypercapnic respiratory failure, hypoxic respiratory failure, pneumonia, CHF exacerbation, COPD, sepsis, electrolyte abnormality, AKI, arrhythmia.  Patient brought back to a room after she became unresponsive in wheelchair while being taken to triage, no palpable pulses on my assessment and CPR was initiated.  Patient appears to be in PEA, received 2 rounds of epinephrine and CPR with ROSC.  She was subsequently intubated, did require sedation and paralytic to facilitate intubation.  Suspect  respiratory arrest, chest x-ray concerning for pulmonary edema but no focal infiltrate noted.  Labs with leukocytosis but no significant anemia, mild AKI noted with hypokalemia and hypocalcemia, which we will replete.  Initial VBG with pH of 6.9 and significantly elevated pCO2, now improved on following up VBG.  Troponin mildly elevated, uptrending on repeat but no ischemic changes noted on EKG.  BNP elevated consistent with CHF, plan to diurese with IV Lasix once electrolytes improved.  Lactic acid downtrending on recheck, case discussed with ICU team for admission.  On reassessment, patient now with downtrending blood pressure, due to concern for CHF leading to cardiac arrest, we will hold off on IV fluids and start on Levophed for pressure support.      FINAL CLINICAL IMPRESSION(S) / ED DIAGNOSES   Final diagnoses:  Cardiac arrest (HCC)  Acute respiratory failure with hypoxia and hypercapnia (HCC)  Hypocalcemia     Rx / DC Orders   ED Discharge Orders     None        Note:  This document was prepared using Dragon voice recognition software and may include unintentional dictation errors.   Chesley Noon, MD 02/11/23 1333    Chesley Noon, MD 02/11/23 [phone]

## 2023-02-11 NOTE — Consult Note (Signed)
PHARMACY CONSULT NOTE - ELECTROLYTES  Pharmacy Consult for Electrolyte Monitoring and Replacement   Recent Labs: Potassium (mmol/L)  Date Value  02/11/2023 2.9 (L)   Magnesium (mg/dL)  Date Value  44/02/270 1.8   Calcium (mg/dL)  Date Value  53/66/4403 6.1 (LL)   Albumin (g/dL)  Date Value  47/42/5956 2.2 (L)   Sodium (mmol/L)  Date Value  02/11/2023 139  10/26/2017 142   Corrected Ca: 7.5 mg/dL  Weight: 387.5 kg (643 lb 7.3 oz) (Weight taken from 09/2022 encounter, patient's bed is broken per RN) CrCl cannot be calculated (Unknown ideal weight.).  Assessment  [name] is a 82 y.o. female presenting with respiratory distress / PEA arrest / septic shock requiring vasopressors. PMH significant for hypertension, diabetes, CHF, aortic stenosis status post TAVR, and nonalcoholic fatty liver disease . Pharmacy has been consulted to monitor and replace electrolytes.  Patient is intubated, sedated, on mechanical ventilation  Diet: NPO MIVF: N/A, holding off on fluids given suspected CHF Pertinent medications: N/A  Goal of Therapy:  K >= 4, Mg >= 2  Plan:  K 2.9, Kcl 10 mEq IV q1h x 4 doses Mg 1.8, magnesium sulfate 2 g IV x 1 dose Corrected calcium 7.5, calcium gluconate 1 g IV Re-check BMP, Mg today at 2000 and all electrolytes again tomorrow AM  Thank you for allowing pharmacy to be a part of this patient's care.  Tressie Ellis 02/11/2023 2:26 PM

## 2023-02-12 ENCOUNTER — Inpatient Hospital Stay (HOSPITAL_COMMUNITY)
Admit: 2023-02-12 | Discharge: 2023-02-12 | Disposition: A | Discharge status: Home / Self Care | Payer: Medicare Other | Attending: Pulmonary Disease

## 2023-02-12 DIAGNOSIS — J989 Respiratory disorder, unspecified: Secondary | ICD-10-CM

## 2023-02-12 DIAGNOSIS — I469 Cardiac arrest, cause unspecified: Secondary | ICD-10-CM

## 2023-02-12 DIAGNOSIS — I2510 Atherosclerotic heart disease of native coronary artery without angina pectoris: Secondary | ICD-10-CM

## 2023-02-12 DIAGNOSIS — J9601 Acute respiratory failure with hypoxia: Secondary | ICD-10-CM | POA: Diagnosis not present

## 2023-02-12 DIAGNOSIS — R579 Shock, unspecified: Secondary | ICD-10-CM | POA: Diagnosis not present

## 2023-02-12 DIAGNOSIS — R7989 Other specified abnormal findings of blood chemistry: Secondary | ICD-10-CM

## 2023-02-12 DIAGNOSIS — I468 Cardiac arrest due to other underlying condition: Secondary | ICD-10-CM | POA: Diagnosis not present

## 2023-02-12 DIAGNOSIS — R079 Chest pain, unspecified: Secondary | ICD-10-CM | POA: Diagnosis not present

## 2023-02-12 DIAGNOSIS — Z952 Presence of prosthetic heart valve: Secondary | ICD-10-CM

## 2023-02-12 LAB — ECHOCARDIOGRAM COMPLETE
AR max vel: 0.92 cm2
AV Area VTI: 0.93 cm2
AV Area mean vel: 0.92 cm2
AV Mean grad: 15 mm[Hg]
AV Peak grad: 29.1 mm[Hg]
Ao pk vel: 2.7 m/s
Area-P 1/2: 1.82 cm2
Height: 62.008 in
MV VTI: 1.18 cm2
P 1/2 time: 557 ms
S' Lateral: 3.9 cm
Single Plane A4C EF: 35.3 %
Weight: 4642.01 [oz_av]

## 2023-02-12 LAB — GLUCOSE, CAPILLARY
Glucose-Capillary: 201 mg/dL — ABNORMAL HIGH (ref 70–99)
Glucose-Capillary: 188 mg/dL — ABNORMAL HIGH (ref 70–99)
Glucose-Capillary: 188 mg/dL — ABNORMAL HIGH (ref 70–99)
Glucose-Capillary: 175 mg/dL — ABNORMAL HIGH (ref 70–99)
Glucose-Capillary: 151 mg/dL — ABNORMAL HIGH (ref 70–99)
Glucose-Capillary: 143 mg/dL — ABNORMAL HIGH (ref 70–99)

## 2023-02-12 LAB — PROCALCITONIN: Procalcitonin: 12.18 ng/mL

## 2023-02-12 LAB — CBC
HCT: 31.9 % — ABNORMAL LOW (ref 36.0–46.0)
Hemoglobin: 10 g/dL — ABNORMAL LOW (ref 12.0–15.0)
MCH: 28.6 pg (ref 26.0–34.0)
MCHC: 31.3 g/dL (ref 30.0–36.0)
MCV: 91.1 fL (ref 80.0–100.0)
Platelets: 166 10*3/uL (ref 150–400)
RBC: 3.5 MIL/uL — ABNORMAL LOW (ref 3.87–5.11)
RDW: 15.1 % (ref 11.5–15.5)
WBC: 11.1 10*3/uL — ABNORMAL HIGH (ref 4.0–10.5)
nRBC: 0 % (ref 0.0–0.2)

## 2023-02-12 LAB — TROPONIN I (HIGH SENSITIVITY)
Troponin I (High Sensitivity): 1078 ng/L (ref <18)
Troponin I (High Sensitivity): 1028 ng/L (ref <18)
Troponin I (High Sensitivity): 508 ng/L (ref <18)
Troponin I (High Sensitivity): 1035 ng/L (ref <18)
Troponin I (High Sensitivity): 515 ng/L (ref <18)
Troponin I (High Sensitivity): 1098 ng/L (ref <18)

## 2023-02-12 LAB — BLOOD GAS, ARTERIAL
Acid-Base Excess: 0.3 mmol/L (ref 0.0–2.0)
Bicarbonate: 23.3 mmol/L (ref 20.0–28.0)
FIO2: 40 %
MECHVT: 450 mL
Mechanical Rate: 20
O2 Saturation: 99.9 %
PEEP: 5 cmH2O
Patient temperature: 37
pCO2 arterial: 32 mm[Hg] (ref 32–48)
pH, Arterial: 7.47 — ABNORMAL HIGH (ref 7.35–7.45)
pO2, Arterial: 123 mm[Hg] — ABNORMAL HIGH (ref 83–108)

## 2023-02-12 LAB — RENAL FUNCTION PANEL
Albumin: 2.5 g/dL — ABNORMAL LOW (ref 3.5–5.0)
Anion gap: 7 (ref 5–15)
BUN: 28 mg/dL — ABNORMAL HIGH (ref 8–23)
CO2: 25 mmol/L (ref 22–32)
Calcium: 7.8 mg/dL — ABNORMAL LOW (ref 8.9–10.3)
Chloride: 103 mmol/L (ref 98–111)
Creatinine, Ser: 1.31 mg/dL — ABNORMAL HIGH (ref 0.44–1.00)
GFR, Estimated: 41 mL/min — ABNORMAL LOW (ref >60)
Glucose, Bld: 224 mg/dL — ABNORMAL HIGH (ref 70–99)
Phosphorus: 3 mg/dL (ref 2.5–4.6)
Potassium: 4.5 mmol/L (ref 3.5–5.1)
Sodium: 135 mmol/L (ref 135–145)

## 2023-02-12 LAB — HEPARIN LEVEL (UNFRACTIONATED)
Heparin Unfractionated: 0.14 [IU]/mL — ABNORMAL LOW (ref 0.30–0.70)
Heparin Unfractionated: 0.23 [IU]/mL — ABNORMAL LOW (ref 0.30–0.70)
Heparin Unfractionated: 0.33 [IU]/mL (ref 0.30–0.70)

## 2023-02-12 LAB — TRIGLYCERIDES: Triglycerides: 120 mg/dL (ref <150)

## 2023-02-12 LAB — BRAIN NATRIURETIC PEPTIDE: B Natriuretic Peptide: 623.5 pg/mL — ABNORMAL HIGH (ref 0.0–100.0)

## 2023-02-12 LAB — HEMOGLOBIN A1C
Hgb A1c MFr Bld: 8.8 % — ABNORMAL HIGH (ref 4.8–5.6)
Mean Plasma Glucose: 205.86 mg/dL

## 2023-02-12 LAB — MAGNESIUM: Magnesium: 2.4 mg/dL (ref 1.7–2.4)

## 2023-02-12 MED ORDER — CHLORHEXIDINE GLUCONATE CLOTH 2 % EX PADS
6.0000 | MEDICATED_PAD | Freq: Every day | CUTANEOUS | Status: DC
  Administered 2023-02-12 – 2023-02-21 (×10): 6 via TOPICAL

## 2023-02-12 MED ORDER — ORAL CARE MOUTH RINSE: 15.0000 mL | OROMUCOSAL | Status: DC | PRN

## 2023-02-12 MED ORDER — KETOROLAC TROMETHAMINE 15 MG/ML IJ SOLN
15.0000 mg | Freq: Once | INTRAMUSCULAR | Status: AC
  Administered 2023-02-12: 15 mg via INTRAVENOUS
  Filled 2023-02-12: qty 1

## 2023-02-12 MED ORDER — FUROSEMIDE 10 MG/ML IJ SOLN
40.0000 mg | Freq: Two times a day (BID) | INTRAMUSCULAR | Status: DC
  Administered 2023-02-12 – 2023-02-13 (×3): 40 mg via INTRAVENOUS
  Filled 2023-02-12 (×3): qty 4

## 2023-02-12 MED ORDER — PERFLUTREN LIPID MICROSPHERE
1.0000 mL | INTRAVENOUS | Status: AC | PRN
Start: 2023-02-12 — End: 2023-02-12
  Administered 2023-02-12: 5 mL via INTRAVENOUS

## 2023-02-12 MED ORDER — ORAL CARE MOUTH RINSE
15.0000 mL | OROMUCOSAL | Status: DC
  Administered 2023-02-12 (×4): 15 mL via OROMUCOSAL

## 2023-02-12 MED ORDER — HEPARIN BOLUS VIA INFUSION
2500.0000 [IU] | Freq: Once | INTRAVENOUS | Status: AC
  Administered 2023-02-12: 2500 [IU] via INTRAVENOUS
  Filled 2023-02-12: qty 2500

## 2023-02-12 MED ORDER — HEPARIN BOLUS VIA INFUSION
1250.0000 [IU] | Freq: Once | INTRAVENOUS | Status: AC
Start: 2023-02-12 — End: 2023-02-12
  Administered 2023-02-12: 1250 [IU] via INTRAVENOUS
  Filled 2023-02-12: qty 1250

## 2023-02-12 MED ORDER — MORPHINE SULFATE (PF) 2 MG/ML IV SOLN
1.0000 mg | Freq: Once | INTRAVENOUS | Status: AC
  Administered 2023-02-12: 1 mg via INTRAVENOUS
  Filled 2023-02-12: qty 1

## 2023-02-12 MED ORDER — VANCOMYCIN HCL 750 MG/150ML IV SOLN: 750.0000 mg | INTRAVENOUS | Status: DC

## 2023-02-12 MED ORDER — POLYETHYLENE GLYCOL 3350 17 G PO PACK
17.0000 g | PACK | Freq: Every day | ORAL | Status: DC | PRN
  Administered 2023-02-18: 17 g via ORAL
  Filled 2023-02-12: qty 1

## 2023-02-12 NOTE — Consult Note (Signed)
Cardiology Consultation:   Patient ID: [name]; [id number]; 11-19-1940   Admit date: 02/11/2023 Date of Consult: 02/12/2023  Primary Care Provider: Kaleen Mask, MD Primary Cardiologist: New - previously followed by structural heart Primary Electrophysiologist:  None   Patient Profile:   [name] is a 82 y.o. female with a hx of nonobstructive CAD by LHC in 2019, severe aortic stenosis status post TAVR in 11/2017, HFpEF, fibromyalgia, DM2 HTN, HLD, and obesity  who is being seen today for the evaluation of elevated troponin at the request of Zada Girt, NP.  History of Present Illness:   Ms. Valleroy was evaluated for severe aortic stenosis and underwent TAVR on 12/06/2017.  As part of that preprocedure workup she underwent R/LHC on 11/02/2017 that showed mild nonobstructive CAD with 10% proximal to mid LAD stenosis and 10% mid RCA stenosis.  Echo from 01/28/2021 showed an EF of 55 to 60%, possible apical inferoseptal and apical wall hypokinesis, though the views were slightly off access, moderate LVH of the basal septum with remaining LV segments demonstrating mild to moderate concentric LVH, grade 2 diastolic dysfunction, normal RV systolic function and ventricular cavity size, mildly elevated RVSP estimated at 39.2 mmHg, mildly dilated left atrium, trivial mitral regurgitation with severe mitral annular calcification, and normal structure and function of TAVR.   She was admitted to the hospital in 01/2022 with lower extremity cellulitis and left toe ulcer with underlying fracture and osteomyelitis.   She was admitted to [facility] in 09/2022 with acute on chronic HF PEF due to missing diuretic.  She was IV diuresed with symptomatic improvement.  Echo during that admission showed an EF of 45%, apical and mid anterior segment hypokinesis, normal RV systolic function and ventricular cavity size, normal TAVR function, and mild tricuspid regurgitation.   She was on the way to the  hospital for a previously scheduled outpatient appointment via transport service.  And route, she became increasingly short of breath with associated respiratory distress.  She was brought to the ED where she was able to be loaded onto a wheelchair and taken into triage.  Shortly after arrival in triage she became unresponsive with no palpable pulse.  CPR was initiated.  ED note indicated the patient appeared to be in PEA arrest.  She received 2 rounds of epinephrine and CPR with ROSC.  She was subsequently intubated and sedated.  She was placed on Levophed for vasopressor support as well as heparin drip for elevated troponin.  She is also receiving cefepime, doxycycline, and vancomycin.   Imaging: Chest x-ray with pulmonary vascular congestion without overt edema.  CT head with no evidence of acute intracranial abnormality.  CTA chest with no large central pulmonary embolus or evidence of lobar or segmental pulmonary embolus.  There was also interlobular septal thickening of the upper lobes bilaterally with predominant groundglass opacity, collapse/consolidative disease in the lower lungs bilaterally with associated small bilateral pleural effusions compatible with atelectasis versus pneumonia as well as bilateral pulmonary nodules unchanged since prior study, cirrhosis, and aortic atherosclerosis.  Repeat chest x-ray showed patchy left greater than right basilar airspace disease concerning for atelectasis versus pneumonia as well as vascular congestion.  Findings appeared worse when compared to prior.   Labs: Initial high-sensitivity troponin 45 with a delta troponin 94 subsequently trending 2 and flat trending between 1035 and 1153, now downtrending.  BNP 1124 trending to 400.  Initial lactic acid greater than 9, now normalized.  PCT less than 0.1 trending to  12.18.  Respiratory panel negative.  Potassium 2.9 trending to 4.5, calcium 6.1 trending to 7.8, BUN 21 trending to 28, serum creatinine 1.11 trending  to 1.31, albumin 2.2.  Magnesium 2.5.  WBC 28.5 trending to 11.1, Hgb 11.9 trending to 10.0.   At time of cardiology consult, she remains intubated and sedated on Levophed.    Past Medical History:  Diagnosis Date   Abnormal thyroid biopsy 2018   results were negative.   Anemia    "when I was alot younger" (12/06/2017)   Anxiety    self reported   Arthritis    "almost all over; used to cry w/it when I was in my teens" (12/06/2017)   CHF (congestive heart failure) (HCC)    Chronic back pain    "all over" (12/06/2017)   Depression    "lost my son last year to cancer; I tended to him; he lived w/me" (12/06/2017)   Diverticulosis of colon (without mention of hemorrhage)    External hemorrhoids without mention of complication    Fatty liver disease, nonalcoholic    Fibromyalgia    History of kidney stones    History of stomach ulcers 1970   Hyperlipemia    Hypertension    IBS (irritable bowel syndrome)    Morbid obesity (HCC)    Psoriasis    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   26 mm Medtronic Evolut Pro transcatheter heart valve placed via percutaneous right transfemoral approach    Severe aortic stenosis    Sinus headache    Sleep apnea    "was told I do; never have had any problems w/it" (12/06/2017)   Type II diabetes mellitus (HCC)     Past Surgical History:  Procedure Laterality Date   ABDOMINAL ADHESION SURGERY     "took 2 out"   ABDOMINAL HYSTERECTOMY     AMPUTATION Left 01/23/2022   Procedure: AMPUTATION DIGIT;  Surgeon: Candelaria Stagers, DPM;  Location: [facility];  Service: Podiatry;  Laterality: Left;   AMPUTATION TOE Right 02/12/2020   Procedure: Right hallux and second toe amputation;  Surgeon: Toni Arthurs, MD;  Location: [facility];  Service: Orthopedics;  Laterality: Right;   APPENDECTOMY     BACK SURGERY     BIOPSY  08/05/2020   Procedure: BIOPSY;  Surgeon: Charlott Rakes, MD;  Location: [facility];  Service:  Endoscopy;;   BIOPSY THYROID  2018   results were negative.   BREAST LUMPECTOMY Left X 1   "benign"   CATARACT EXTRACTION W/ INTRAOCULAR LENS  IMPLANT, BILATERAL Bilateral    CATARACT EXTRACTION W/ INTRAOCULAR LENS IMPLANT Bilateral 2017   COLONOSCOPY WITH PROPOFOL N/A 08/05/2020   Procedure: COLONOSCOPY WITH PROPOFOL;  Surgeon: Charlott Rakes, MD;  Location: [facility];  Service: Endoscopy;  Laterality: N/A;   ESOPHAGOGASTRODUODENOSCOPY (EGD) WITH PROPOFOL N/A 08/05/2020   Procedure: ESOPHAGOGASTRODUODENOSCOPY (EGD) WITH PROPOFOL;  Surgeon: Charlott Rakes, MD;  Location: [facility];  Service: Endoscopy;  Laterality: N/A;   EYE SURGERY     HEMORRHOID BANDING     LAPAROSCOPIC CHOLECYSTECTOMY     LEFT AND RIGHT HEART CATHETERIZATION WITH CORONARY ANGIOGRAM N/A 11/15/2013   Procedure: LEFT AND RIGHT HEART CATHETERIZATION WITH CORONARY ANGIOGRAM;  Surgeon: Micheline Chapman, MD;  Location: [facility] C[facility];  Service: Cardiovascular;  Laterality: N/A;   LUMBAR DISC SURGERY     POLYPECTOMY  08/05/2020   Procedure: POLYPECTOMY;  Surgeon: Charlott Rakes, MD;  Location: [facility];  Service: Endoscopy;;   RETINAL DETACHMENT  SURGERY Right    RIGHT/LEFT HEART CATH AND CORONARY ANGIOGRAPHY N/A 11/02/2017   Procedure: RIGHT/LEFT HEART CATH AND CORONARY ANGIOGRAPHY;  Surgeon: Kathleene Hazel, MD;  Location: [facility];  Service: Cardiovascular;  Laterality: N/A;   TEE WITHOUT CARDIOVERSION N/A 12/06/2017   Procedure: TRANSESOPHAGEAL ECHOCARDIOGRAM (TEE);  Surgeon: Tonny Bollman, MD;  Location: [facility] OR;  Service: Open Heart Surgery;  Laterality: N/A;   TONSILLECTOMY     TRANSCATHETER AORTIC VALVE REPLACEMENT, TRANSFEMORAL  12/06/2017   TRANSCATHETER AORTIC VALVE REPLACEMENT, TRANSFEMORAL N/A 12/06/2017   Procedure: TRANSCATHETER AORTIC VALVE REPLACEMENT, TRANSFEMORAL;  Surgeon: Tonny Bollman, MD;  Location: [facility] OR;  Service: Open Heart Surgery;  Laterality: N/A;   TRANSESOPHAGEAL  ECHOCARDIOGRAM  12/06/2017     Home Meds: Prior to Admission medications   Medication Sig Start Date End Date Taking? Authorizing Provider  acetaminophen (TYLENOL) 325 MG tablet Take 2 tablets (650 mg total) by mouth every 6 (six) hours as needed for mild pain, fever or headache. 01/25/22  Yes Esaw Grandchild A, DO  atorvastatin (LIPITOR) 40 MG tablet Take 1 tablet by mouth daily. 10/22/22  Yes [provider]  Cholecalciferol (VITAMIN D-3) 5000 units TABS Take 5,000 Units by mouth daily.   Yes [provider]  Cranberry 250 MG TABS Take 250 mg by mouth daily.   Yes [provider]  empagliflozin (JARDIANCE) 10 MG TABS tablet Take 1 tablet by mouth daily. 10/22/22  Yes [provider]  enalapril (VASOTEC) 10 MG tablet Take 10 mg by mouth daily.   Yes [provider]  furosemide (LASIX) 80 MG tablet Take 1 tablet (80 mg total) by mouth daily. 12/14/17 01/22/22 Yes Janetta Hora, PA-C  loratadine (CLARITIN) 10 MG tablet Take 10 mg by mouth daily as needed. 08/14/20  Yes [provider]  metFORMIN (GLUCOPHAGE) 1000 MG tablet Take 1,000 mg by mouth 2 (two) times daily. 05/05/20  Yes [provider]  potassium chloride SA (K-DUR,KLOR-CON) 20 MEQ tablet Take 2 tablets (40 mEq total) by mouth daily. 12/14/17 02/11/23 Yes Janetta Hora, PA-C  ondansetron (ZOFRAN) 4 MG tablet Take 1 tablet (4 mg total) by mouth every 6 (six) hours as needed for nausea. Patient not taking: Reported on 02/11/2023 01/25/22   Esaw Grandchild A, DO  oxyCODONE-acetaminophen (PERCOCET/ROXICET) 5-325 MG tablet Take 1-2 tablets by mouth every 4 (four) hours as needed for moderate pain or severe pain. Patient not taking: Reported on 02/11/2023 01/25/22   Pennie Banter, DO    Inpatient Medications: Scheduled Meds:  Chlorhexidine Gluconate Cloth  6 each Topical Daily   docusate  100 mg Per Tube BID   furosemide  40 mg Intravenous BID   insulin aspart  0-15  Units Subcutaneous Q4H   mouth rinse  15 mL Mouth Rinse Q2H   pantoprazole (PROTONIX) IV  40 mg Intravenous Q24H   polyethylene glycol  17 g Per Tube Daily   Continuous Infusions:  sodium chloride Stopped (02/12/23 0910)   ceFEPime (MAXIPIME) IV Stopped (02/12/23 0556)   doxycycline (VIBRAMYCIN) IV 125 mL/hr at 02/12/23 0935   heparin 1,150 Units/hr (02/12/23 0935)   norepinephrine (LEVOPHED) Adult infusion 2 mcg/min (02/12/23 0935)   propofol (DIPRIVAN) infusion 30 mcg/kg/min (02/12/23 0935)   [START ON 02/13/2023] vancomycin     PRN Meds: docusate, fentaNYL (SUBLIMAZE) injection, fentaNYL (SUBLIMAZE) injection, midazolam, mouth rinse, polyethylene glycol  Allergies:   Allergies  Allergen Reactions   Castor Oil     "passed out" ? SYNCOPE ?  Sulfonamide Derivatives     Other Reaction(s): Not available   Amoxicillin Diarrhea and Other (See Comments)    Has patient had a PCN reaction causing immediate rash, facial/tongue/throat swelling, SOB or lightheadedness with hypotension: No Has patient had a PCN reaction causing severe rash involving mucus membranes or skin necrosis: No Has patient had a PCN reaction that required hospitalization: No Has patient had a PCN reaction occurring within the last 10 years: No If all of the above answers are "NO", then may proceed with Cephalosporin use.    Demerol [Meperidine] Nausea And Vomiting   Hydrocodone Nausea And Vomiting    Social History:   Social History   Socioeconomic History   Marital status: Widowed    Spouse name: Not on file   Number of children: Not on file   Years of education: Not on file   Highest education level: Not on file  Occupational History   Not on file  Tobacco Use   Smoking status: Former    Current packs/day: 0.00    Average packs/day: 1.8 packs/day for 32.0 years (56.0 ttl pk-yrs)    Types: Cigarettes    Start date: 9    Quit date: 2002    Years since quitting: 22.9   Smokeless tobacco: Never   Vaping Use   Vaping status: Never Used  Substance and Sexual Activity   Alcohol use: No   Drug use: Never   Sexual activity: Not Currently  Other Topics Concern   Not on file  Social History Narrative   Not on file   Social Drivers of Health   Financial Resource Strain: Low Risk  (10/22/2022)   Received [facility]   Overall Financial Resource Strain (CARDIA)    Difficulty of Paying Living Expenses: Not very hard  Food Insecurity: Patient Unable To Answer (02/11/2023)   Hunger Vital Sign    Worried About Running Out of Food in the Last Year: Patient unable to answer    Ran Out of Food in the Last Year: Patient unable to answer  Transportation Needs: Not on file (02/11/2023)  Physical Activity: Not on file  Stress: No Stress Concern Present (10/22/2022)   Received from [facility] Occupational [facility] - Occupational Stress Questionnaire    Feeling of Stress : Not at all  Social Connections: Socially Isolated (10/22/2022)   Received from [facility]   Social Connection and Isolation Panel [NHANES]    Frequency of Communication with Friends and Family: Twice a week    Frequency of Social Gatherings with Friends and Family: More than three times a week    Attends Religious Services: Never    Database administrator or Organizations: No    Attends Banker Meetings: Never    Marital Status: Widowed  Intimate Partner Violence: Patient Unable To Answer (02/11/2023)   Humiliation, Afraid, Rape, and Kick questionnaire    Fear of Current or Ex-Partner: Patient unable to answer    Emotionally Abused: Patient unable to answer    Physically Abused: Patient unable to answer    Sexually Abused: Patient unable to answer     Family History:   Family History  Problem Relation Age of Onset   Lung cancer Mother    Diabetes Maternal Grandmother    Heart disease Maternal Grandfather    Colon cancer Neg Hx     ROS:  Review of Systems   Unable to perform ROS: Intubated  Physical Exam/Data:   Vitals:   02/12/23 0445 02/12/23 0800 02/12/23 0900 02/12/23 1000  BP:  (!) 104/59    Pulse: (!) 56 70 61 (!) 59  Resp: 20 16 18 20   Temp: 99.9 F (37.7 C) 100 F (37.8 C) 100 F (37.8 C) 100 F (37.8 C)  TempSrc:  Bladder    SpO2: 100% 96% 100% 100%  Weight:      Height:        Intake/Output Summary (Last 24 hours) at 02/12/2023 1130 Last data filed at 02/12/2023 1000 Gross per 24 hour  Intake 1905.97 ml  Output 755 ml  Net 1150.97 ml   Filed Weights   02/11/23 1200 02/11/23 1700  Weight: 125.4 kg 131.6 kg   Body mass index is 53.05 kg/m.   Physical Exam: General: Chronically ill appearing. Head: Normocephalic, atraumatic, sclera non-icteric, no xanthomas, nares without discharge.  Neck: Negative for carotid bruits. JVD unable to be assessed secondary to respiratory support apparatus and body habitus. Lungs: Diminished and vented breath sounds bilaterally. Heart: RRR with S1 S2. I/VI systolic murmur RUSB, no rubs, or gallops appreciated. Abdomen: Soft, non-tender, non-distended with normoactive bowel sounds. No hepatomegaly. No rebound/guarding. No obvious abdominal masses. Msk:  Strength and tone appear normal for age. Extremities: No clubbing or cyanosis. Bilateral lower extremity erythema with 1+ edema to the mid shins.  Neuro: Intubated and sedated. Psych:  Intubated and sedated.   EKG:  The EKG was personally reviewed and demonstrates: Wide QRS rhythm without definitive P waves with LBBB with repeat study showing sinus bradycardia, 58 bpm, nonspecific IVCD Telemetry:  Telemetry was personally reviewed and demonstrates: sinus rhythm with sinus bradycardia with rare PACs and atrial couplet  Weights: Filed Weights   02/11/23 1200 02/11/23 1700  Weight: 125.4 kg 131.6 kg    Relevant CV Studies:  [facility] 11/02/2017: Mid RCA lesion is 10% stenosed. Prox LAD to Mid LAD lesion is 10% stenosed.   1.  Mild non-obstructive CAD 2. Severe aortic stenosis (peak to peak gradient 62 mmHg, mean gradient 51.6 mmHg, AVA 0.82 cm2)   Recommendations: Will continue workup for TAVR.  __________  2D echo 01/28/2021: 1. Left ventricular ejection fraction, by estimation, is 55 to 60%. The  left ventricle has normal function. The apical inferoseptal wall and apex  appear hypokinetic (although views are slightly off axis). There is  moderate hypertrophy of the basal  septum. The rest of the LV segments demonstrate mild-to-moderate  concentric left ventricular hypertrophy. Left ventricular diastolic  parameters are consistent with Grade II diastolic dysfunction  (pseudonormalization). Elevated left atrial pressure.   2. Right ventricular systolic function is normal. The right ventricular  size is normal. There is mildly elevated pulmonary artery systolic  pressure. The estimated right ventricular systolic pressure is 39.2 mmHg.   3. Left atrial size was mildly dilated.   4. The mitral valve is abnormal. Trivial mitral valve regurgitation.  There is severe mitral annular calcification with thickening and  calcification of the mitral valve leaflets. No significant mitral  stenosis. MVA by VTI 2.68cm2, mean gradient at  HR 83bpm.   5. The aortic valve has been repaired/replaced. There is a 26 mm  CoreValve-Evolut Pro prosthetic (TAVR) valve present in the aortic  position. Procedure Date: 12/06/17. Echo findings are consistent with  normal structure and function of the aortic valve  prosthesis. The mean gradient is , Vmax 1.8 m/s, DI 0.84. There is  mild paravalvular leak best seen on apical 5C view.  Comparison(s): TTE on 12/06/18 the EF was 60-65%. AV mean,  peak. The wall motion seen on current study was not present on prior,  however, views on current study are slightly off-axis.  __________  2D echo 10/20/2022 [facility]): Summary   1. The left ventricle is normal in size  with mildly increased wall  thickness.   2. The left ventricular systolic function is mildly decreased, LVEF is  visually estimated at 45%.    3. The apex and the mid-anterior segment appear hypokinetic.    4. The right ventricle is normal in size, with normal systolic function.    5. Aortic valve replacement (26 mm Medtronic evolute TAVR, implantation  date: 12-06-2017).    6. Aortic valve Doppler indices are consistent with normal prosthetic valve  function.   7. There is mild tricuspid regurgitation.    8. The IVC is not well visualized precluding the ability to accurate assess  right atrial pressure.    9. Technically difficult study.    10. An ultrasound enhancing agent was used to improve the visualization of the left ventricular cavity and endocardial borders.     Laboratory Data:  Chemistry Recent Labs  Lab 02/11/23 1134 02/11/23 1931 02/12/23 0405  NA 139 134* 135  K 2.9* 5.0 4.5  CL 111 101 103  CO2 21* 24 25  GLUCOSE 270* 316* 224*  BUN 21 28* 28*  CREATININE 1.11* 1.40* 1.31*  CALCIUM 6.1* 7.7* 7.8*  GFRNONAA 50* 38* 41*  ANIONGAP 7 7 7     Recent Labs  Lab 02/11/23 1134 02/12/23 0405  PROT 5.2*  --   ALBUMIN 2.2* 2.5*  AST 33  --   ALT 16  --   ALKPHOS 65  --   BILITOT 0.5  --    Hematology Recent Labs  Lab 02/11/23 1023 02/12/23 0405  WBC 28.5* 11.1*  RBC 4.25 3.50*  HGB 11.9* 10.0*  HCT 41.6 31.9*  MCV 97.9 91.1  MCH 28.0 28.6  MCHC 28.6* 31.3  RDW 15.1 15.1  PLT 195 166   Cardiac EnzymesNo results for input(s): "TROPONINI" in the last 168 hours. No results for input(s): "TROPIPOC" in the last 168 hours.  BNP Recent Labs  Lab 02/11/23 1023 02/11/23 1424 02/12/23 0405  BNP 1,124.0* 400.3* 623.5*    DDimer No results for input(s): "DDIMER" in the last 168 hours.  Radiology/Studies:  [facility] Chest Port 1 View Result Date: 02/11/2023 IMPRESSION: 1. Left IJ central venous catheter tip over the SVC confluence. No pneumothorax. 2.  Cardiomegaly with vascular congestion. Patchy left greater than right basilar airspace disease, atelectasis versus pneumonia. This appears worse compared to prior Electronically Signed   By: Jasmine Pang M.D.   On: 02/11/2023 21:00   CT Angio Chest PE W/Cm &/Or Wo Cm Result Date: 02/11/2023 IMPRESSION: 1. No large central pulmonary embolus. No evidence for lobar or segmental pulmonary embolus. Subsegmental pulmonary arteries to both lower lobes are not reliably evaluated due to bolus timing and motion artifact. 2. Interlobular septal thickening in the upper lobes bilaterally with bilateral asymmetric central predominant ground-glass opacity. Imaging features are compatible with pulmonary edema. 3. Collapse/consolidative disease in the lower lungs bilaterally is associated with small bilateral pleural effusions. Lower lobe disease compatible with atelectasis and/or pneumonia. 4. Bilateral pulmonary nodules noted, unchanged since remote prior where visible although portions of both lungs have been obscured by airspace disease. Consider follow-up CT chest without contrast after resolution of acute symptoms for more definitive evaluation of  pulmonary nodules. 5. Cirrhosis. 6.  Aortic Atherosclerosis (ICD10-I70.0). Electronically Signed   By: Kennith Center M.D.   On: 02/11/2023 15:13   CT Head Wo Contrast Result Date: 02/11/2023  IMPRESSION: No evidence of acute intracranial abnormality. An MRI could provide more sensitive evaluation for hypoxic/ischemic injury if clinically warranted Electronically Signed   By: Feliberto Harts M.D.   On: 02/11/2023 14:12   DG Abdomen 1 View Result Date: 02/11/2023 IMPRESSION: 1. Enteric catheter tip projecting over the gastric fundus. Electronically Signed   By: Sharlet Salina M.D.   On: 02/11/2023 11:25   DG Chest Portable 1 View Result Date: 02/11/2023 IMPRESSION: 1. Support devices as above. 2. Pulmonary vascular congestion without overt edema. Electronically  Signed   By: Sharlet Salina M.D.   On: 02/11/2023 11:23    Assessment and Plan:   1. Respiratory arrest with presumed septic shock and elevated high-sensitivity troponin:  -Suspected to be a primary respiratory event  -Elevated high-sensitivity troponin peaking at 1153, cannot exclude NSTEMI, though favor supply/demand ischemia in the setting of her arrest  -Agree with heparin drip for 48 hours  -Obtain echo  -Pre-TAVR cath in 2019 showed minimal nonobstructive disease estimated at 10% involving the proximal to mid LAD and RCA  -Once patient is improved from her acute illness, would pursue ischemic testing with timing to be determined  -Will benefit from IV diuresis, start IV Lasix 40 mg bid -Ongoing management per critical care medicine   History of severe aortic stenosis:  -Status post TAVR in 2019  -Obtain echo   Electrolyte derangements:  -Improving  -Replete per critical care medicine protocol   CKD stage II:  -Renal function stable   Normocytic anemia:  -Monitor on heparin drip       For questions or updates, please contact [facility] Please consult [email or web address] for contact info under Cardiology/STEMI.   Signed, Eula Listen, PA-C [facility] Pager: [phone] 02/12/2023, 11:30 AM

## 2023-02-12 NOTE — Plan of Care (Signed)
  Problem: Clinical Measurements: Goal: Ability to maintain clinical measurements within normal limits will improve Outcome: Progressing Goal: Will remain free from infection Outcome: Progressing Goal: Diagnostic test results will improve Outcome: Progressing Goal: Respiratory complications will improve Outcome: Progressing Goal: Cardiovascular complication will be avoided Outcome: Progressing   Problem: Elimination: Goal: Will not experience complications related to urinary retention Outcome: Progressing   Problem: Pain Management: Goal: General experience of comfort will improve Outcome: Progressing   Problem: Safety: Goal: Ability to remain free from injury will improve Outcome: Progressing   Problem: Skin Integrity: Goal: Risk for impaired skin integrity will decrease Outcome: Progressing   Problem: Respiratory: Goal: Ability to maintain a clear airway and adequate ventilation will improve Outcome: Progressing   Problem: Role Relationship: Goal: Method of communication will improve Outcome: Progressing   Problem: Tissue Perfusion: Goal: Adequacy of tissue perfusion will improve Outcome: Progressing

## 2023-02-12 NOTE — Progress Notes (Signed)
PHARMACY - ANTICOAGULATION CONSULT NOTE  Pharmacy Consult for Heparin Infusion Indication: chest pain/ACS  Allergies  Allergen Reactions   Castor Oil     "passed out" ? SYNCOPE ?   Sulfonamide Derivatives     Other Reaction(s): Not available   Amoxicillin Diarrhea and Other (See Comments)    Has patient had a PCN reaction causing immediate rash, facial/tongue/throat swelling, SOB or lightheadedness with hypotension: No Has patient had a PCN reaction causing severe rash involving mucus membranes or skin necrosis: No Has patient had a PCN reaction that required hospitalization: No Has patient had a PCN reaction occurring within the last 10 years: No If all of the above answers are "NO", then may proceed with Cephalosporin use.    Demerol [Meperidine] Nausea And Vomiting   Hydrocodone Nausea And Vomiting    Patient Measurements: Height: 5' 2.01" (157.5 cm) Weight: 131.6 kg (290 lb 2 oz) IBW/kg (Calculated) : 50.12 Heparin Dosing Weight: 83.3 kg  Vital Signs: Temp: 100 F (37.8 C) (12/21 2130) Temp Source: Bladder (12/21 2000) BP: 77/59 (12/21 2130) Pulse Rate: 78 (12/21 2130)  Labs: Recent Labs    02/11/23 1023 02/11/23 1134 02/11/23 1216 02/11/23 1931 02/11/23 2219 02/12/23 0224 02/12/23 0405 02/12/23 1313 02/12/23 1706 02/12/23 1848 02/12/23 2152  HGB 11.9*  --   --   --   --   --  10.0*  --   --   --   --   HCT 41.6  --   --   --   --   --  31.9*  --   --   --   --   PLT 195  --   --   --   --   --  166  --   --   --   --   APTT  --   --   --  57*  --   --   --   --   --   --   --   LABPROT  --   --   --  15.3*  --   --   --   --   --   --   --   INR  --   --   --  1.2  --   --   --   --   --   --   --   HEPARINUNFRC  --   --   --   --   --   --  0.14* 0.23*  --   --  0.33  CREATININE  --  1.11*  --  1.40*  --   --  1.31*  --   --   --   --   TROPONINIHS 45*  --    < > 1,098*   < > 1,028*  --   --  508* 515*  --    < > = values in this interval not  displayed.    Estimated Creatinine Clearance: 44.7 mL/min (A) (by C-G formula based on SCr of 1.31 mg/dL (H)).   Medical History: Past Medical History:  Diagnosis Date   Abnormal thyroid biopsy 2018   results were negative.   Anemia    "when I was alot younger" (12/06/2017)   Anxiety    self reported   Arthritis    "almost all over; used to cry w/it when I was in my teens" (12/06/2017)   CHF (congestive heart failure) (HCC)    Chronic back pain    "  all over" (12/06/2017)   Depression    "lost my son last year to cancer; I tended to him; he lived w/me" (12/06/2017)   Diverticulosis of colon (without mention of hemorrhage)    External hemorrhoids without mention of complication    Fatty liver disease, nonalcoholic    Fibromyalgia    History of kidney stones    History of stomach ulcers 1970   Hyperlipemia    Hypertension    IBS (irritable bowel syndrome)    Morbid obesity (HCC)    Psoriasis    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 She was found to have an elevated troponin level of 1,035. Pharmacy was consulted to initiate patient on heparin infusion for ACS/STEMI. Patient was not on anticoagulation prior to arrival.   Baseline INR and aPTT ordered.  No signs/symptoms of bleeding noted in chart. Hgb 11.9. PLT 195.  Goal of Therapy:  Heparin level 0.3-0.7 units/ml Monitor platelets by anticoagulation protocol: Yes  Heparin  12/21@0405   HL = 1.4 SUBtherapeutic  12/21@1313  HL = 0.23 SUBtherapeutic  12/21@2152  HL = 0.33 Therapeutic X 1                  Plan:  12/21:  HL @ 2152 = 0.33, therapeutic X 1 - Will continue pt on current rate and recheck HL on 12/22 @ 0600.  Daily CBC  Catelynn Sparger D, PharmD 02/12/2023 10:18 PM

## 2023-02-12 NOTE — Consult Note (Signed)
PHARMACY CONSULT NOTE - ELECTROLYTES  Pharmacy Consult for Electrolyte Monitoring and Replacement   Recent Labs: Potassium (mmol/L)  Date Value  02/12/2023 4.5   Magnesium (mg/dL)  Date Value  16/11/9602 2.4   Calcium (mg/dL)  Date Value  54/10/8117 7.8 (L)   Albumin (g/dL)  Date Value  14/78/2956 2.5 (L)   Phosphorus (mg/dL)  Date Value  21/30/8657 3.0   Sodium (mmol/L)  Date Value  02/12/2023 135  10/26/2017 142   Corrected Ca: 9.9 mg/dL  Height: 5' 8.46" (962.9 cm) Weight: 131.6 kg (290 lb 2 oz) IBW/kg (Calculated) : 50.12 Estimated Creatinine Clearance: 44.7 mL/min (A) (by C-G formula based on SCr of 1.31 mg/dL (H)).  Assessment  [name] is a 82 y.o. female presenting with respiratory distress / PEA arrest / septic shock requiring vasopressors. PMH significant for hypertension, diabetes, CHF, aortic stenosis status post TAVR, and nonalcoholic fatty liver disease . Pharmacy has been consulted to monitor and replace electrolytes.  Patient is intubated, sedated, on mechanical ventilation  Diet: NPO MIVF: N/A, holding off on fluids given suspected CHF Pertinent medications: on lasix 40mg  IV BID  Goal of Therapy:  K >= 4, Mg >= 2  Plan:  No replacement needed F/u with AM labs.   Thank you for allowing pharmacy to be a part of this patient's care.  Ronnald Ramp, PharmD Clinical Pharmacist 02/12/2023 11:57 AM

## 2023-02-12 NOTE — Progress Notes (Signed)
Pharmacy Antibiotic Note  [name] is a 82 y.o. female admitted on 02/11/2023 with cellulitis and left toe infected ulcer .  Pharmacy has been consulted for Cefepime and Vancomycin dosing.  Plan: Day 2 of abx.  Pt received vancomycin 2000 mg IV x 1 followed by vancomycin 750 mg q24H. Predicted AUC of 424. Goal AUC 400-600. Vd 0.5, SCR 1.31 (seems to be close to baseline), IBW. Plan to obtain level after 4th or 5th dose.   Continue cefepime 2g IV q12h  Continue doxycyline 100 mg BID    Height: 5' 2.01" (157.5 cm) Weight: 131.6 kg (290 lb 2 oz) IBW/kg (Calculated) : 50.12  Temp (24hrs), Avg:98.4 F (36.9 C), Min:97.1 F (36.2 C), Max:100 F (37.8 C)  Recent Labs  Lab 02/11/23 1023 02/11/23 1024 02/11/23 1134 02/11/23 1216 02/11/23 1424 02/11/23 1757 02/11/23 1931 02/12/23 0405  WBC 28.5*  --   --   --   --   --   --  11.1*  CREATININE  --   --  1.11*  --   --   --  1.40* 1.31*  LATICACIDVEN  --  >9.0*  --  4.1* 2.7* 1.2  --   --     Estimated Creatinine Clearance: 44.7 mL/min (A) (by C-G formula based on SCr of 1.31 mg/dL (H)).    Allergies  Allergen Reactions   Castor Oil     "passed out" ? SYNCOPE ?   Sulfonamide Derivatives     Other Reaction(s): Not available   Amoxicillin Diarrhea and Other (See Comments)    Has patient had a PCN reaction causing immediate rash, facial/tongue/throat swelling, SOB or lightheadedness with hypotension: No Has patient had a PCN reaction causing severe rash involving mucus membranes or skin necrosis: No Has patient had a PCN reaction that required hospitalization: No Has patient had a PCN reaction occurring within the last 10 years: No If all of the above answers are "NO", then may proceed with Cephalosporin use.    Demerol [Meperidine] Nausea And Vomiting   Hydrocodone Nausea And Vomiting    Antimicrobials this admission: Cefepime 12/20 >>  Vancomycin 12/20 >>  Doxcycline 12/21 >>   Microbiology results: 12/20 bcx  pending 12/21 resp cx pending  1220 ucx pending  12/20 resp panel negative.   Thank you for allowing pharmacy to be a part of this patient's care.  Paschal Dopp, PharmD, BCPS 02/12/2023 12:04 PM

## 2023-02-12 NOTE — Consult Note (Signed)
WOC Nurse Consult Note: Reason for Consult: breakdown under breast and pannus; LE wounds Wound type: ICD-10 CM Codes for Irritant Dermatitis L24A0 - Due to friction or contact with body fluids; unspecified L30.4  - Erythema intertrigo. Also used for abrasion of the hand, chafing of the skin, dermatitis due to sweating and friction, friction dermatitis, friction eczema, and genital/thigh intertrigo.  Pressure Injury POA: NA Measurement: NA Wound bed: redness, see nursing flow sheets Drainage (amount, consistency, odor) none documented for ICD Periwound:intact  Dressing procedure/placement/frequency: Antimicrobial wicking fabric under pannus and breast, see orders LALM in the ICU will assist in moisture management as well.    Re consult if needed, will not follow at this time. Thanks  Nohea Kras [employer], RN,CWOCN, CNS, CWON-AP [phone])

## 2023-02-12 NOTE — Progress Notes (Signed)
PHARMACY - ANTICOAGULATION CONSULT NOTE  Pharmacy Consult for Heparin Infusion Indication: chest pain/ACS  Allergies  Allergen Reactions   Castor Oil     "passed out" ? SYNCOPE ?   Sulfonamide Derivatives     Other Reaction(s): Not available   Amoxicillin Diarrhea and Other (See Comments)    Has patient had a PCN reaction causing immediate rash, facial/tongue/throat swelling, SOB or lightheadedness with hypotension: No Has patient had a PCN reaction causing severe rash involving mucus membranes or skin necrosis: No Has patient had a PCN reaction that required hospitalization: No Has patient had a PCN reaction occurring within the last 10 years: No If all of the above answers are "NO", then may proceed with Cephalosporin use.    Demerol [Meperidine] Nausea And Vomiting   Hydrocodone Nausea And Vomiting    Patient Measurements: Height: 5' 2.01" (157.5 cm) Weight: 131.6 kg (290 lb 2 oz) IBW/kg (Calculated) : 50.12 Heparin Dosing Weight: 83.3 kg  Vital Signs: Temp: 99.7 F (37.6 C) (12/21 1300) Temp Source: Bladder (12/21 0800) BP: 99/42 (12/21 1200) Pulse Rate: 55 (12/21 1300)  Labs: Recent Labs    02/11/23 1023 02/11/23 1134 02/11/23 1216 02/11/23 1931 02/11/23 2219 02/12/23 0008 02/12/23 0224 02/12/23 0405 02/12/23 1313  HGB 11.9*  --   --   --   --   --   --  10.0*  --   HCT 41.6  --   --   --   --   --   --  31.9*  --   PLT 195  --   --   --   --   --   --  166  --   APTT  --   --   --  57*  --   --   --   --   --   LABPROT  --   --   --  15.3*  --   --   --   --   --   INR  --   --   --  1.2  --   --   --   --   --   HEPARINUNFRC  --   --   --   --   --   --   --  0.14* 0.23*  CREATININE  --  1.11*  --  1.40*  --   --   --  1.31*  --   TROPONINIHS 45*  --    < > 1,098* 1,153* 1,078* 1,028*  --   --    < > = values in this interval not displayed.    Estimated Creatinine Clearance: 44.7 mL/min (A) (by C-G formula based on SCr of 1.31 mg/dL  (H)).   Medical History: Past Medical History:  Diagnosis Date   Abnormal thyroid biopsy 2018   results were negative.   Anemia    "when I was alot younger" (12/06/2017)   Anxiety    self reported   Arthritis    "almost all over; used to cry w/it when I was in my teens" (12/06/2017)   CHF (congestive heart failure) (HCC)    Chronic back pain    "all over" (12/06/2017)   Depression    "lost my son last year to cancer; I tended to him; he lived w/me" (12/06/2017)   Diverticulosis of colon (without mention of hemorrhage)    External hemorrhoids without mention of complication    Fatty liver disease, nonalcoholic    Fibromyalgia  History of kidney stones    History of stomach ulcers 1970   Hyperlipemia    Hypertension    IBS (irritable bowel syndrome)    Morbid obesity (HCC)    Psoriasis    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 She was found to have an elevated troponin level of 1,035. Pharmacy was consulted to initiate patient on heparin infusion for ACS/STEMI. Patient was not on anticoagulation prior to arrival.   Baseline INR and aPTT ordered.  No signs/symptoms of bleeding noted in chart. Hgb 11.9. PLT 195.  Goal of Therapy:  Heparin level 0.3-0.7 units/ml Monitor platelets by anticoagulation protocol: Yes  Heparin  12/21@0405  HL = 1.4 SUBtherapeutic  12/21@1313  HL = 0.23 SUBtherapeutic                     Plan:  Will order  heparin bolus 1250 units IV X 1  Increase heparin drip rate to 1300 units/hr from 1150 units/hr Will recheck HL 8 hrs after rate change Daily CBC  Effie Shy, PharmD Pharmacy Resident  02/12/2023 2:16 PM

## 2023-02-12 NOTE — Progress Notes (Signed)
CRITICAL CARE    Name: [name] MRN: [id number] DOB: [birth year]     LOS: 1   SUBJECTIVE FINDINGS & SIGNIFICANT EVENTS    History of Presenting Illness:  This is a 82 year old female with a history of Essential hypertension, heart failure with preserved LVEF, Severe AS s/p TAVR, hypertension, diabetes type 2, diabetic ulcers, fatty liver disease, GERD, chronic dysphagia, history of colon polyps, was admitted and discharged earlier in dec 2024 with b/l LE cellulits and left toe infected ulcer with underlying fracture of phalanx and osteomyelitis. She apparently was on the way to hospital for routine appt and found to be hypoxemic in field.  She required intubation for severe hypoxemia in ED. Patient seen in ER with circulatory shock on levophed while intubated.  PCCM consultation for further evaluation and treatment.     02/12/23- patient has been liberated from MV. She reported Chest pain post extubation. Delivered 1mg  morphine and obtained cardiac biomarkers.  Vital signs are stable.   Lines/tubes : Airway 7.5 mm (Active)  Secured at (cm) 24 cm 02/11/23 1018  Measured From Lips 02/11/23 1018  Secured Location Center 02/11/23 1018  Secured By [employer] 02/11/23 1018     NG/OG Vented/Dual Lumen 16 Fr. Oral Marking at nare/corner of mouth (Active)  Tube Position (Required) Marking at nare/corner of mouth 02/11/23 1022     Urethral Catheter Vet, RN Temperature probe 14 Fr. (Active)  Indication for Insertion or Continuance of Catheter Unstable critically ill patients first 24-48 hours (See Criteria) 02/11/23 1036  Site Assessment Clean, Dry, Intact 02/11/23 1036  Catheter Maintenance Bag below level of bladder;Catheter secured;Insertion date on drainage bag;Drainage bag/tubing not touching floor;No  dependent loops;Seal intact 02/11/23 1036  Collection Container Standard drainage bag 02/11/23 1036     External Urinary Catheter (Active)    Microbiology/Sepsis markers: Results for orders placed or performed during the hospital encounter of 02/11/23  Culture, blood (routine x 2)     Status: None (Preliminary result)   Collection Time: 02/11/23 10:44 AM   Specimen: BLOOD RIGHT ARM  Result Value Ref Range Status   Specimen Description BLOOD RIGHT ARM  Final   Special Requests   Final    BOTTLES DRAWN AEROBIC AND ANAEROBIC Blood Culture results may not be optimal due to an inadequate volume of blood received in culture bottles   Culture   Final    NO GROWTH < 24 HOURS Performed at [facility], [address] [place], [place] [address]    Report Status PENDING  Incomplete  Culture, blood (routine x 2)     Status: None (Preliminary result)   Collection Time: 02/11/23 10:46 AM   Specimen: BLOOD  Result Value Ref Range Status   Specimen Description BLOOD BLOOD LEFT FOREARM  Final   Special Requests   Final    BOTTLES DRAWN AEROBIC AND ANAEROBIC Blood Culture results may not be optimal due to an inadequate volume of blood received in culture bottles   Culture   Final    NO GROWTH < 24 HOURS Performed at [facility], [address], [place], [place] [address]    Report Status PENDING  Incomplete  SARS Coronavirus 2 by RT PCR (hospital order, performed in [facility] hospital lab) *cepheid single result test* Anterior Nasal Swab     Status: None   Collection Time: 02/11/23  1:37 PM   Specimen: Anterior Nasal Swab  Result Value Ref Range Status   SARS Coronavirus 2 by  RT PCR NEGATIVE NEGATIVE Final    Comment: (NOTE) SARS-CoV-2 target nucleic acids are NOT DETECTED.  The SARS-CoV-2 RNA is generally detectable in upper and lower respiratory specimens during the acute phase of infection. The lowest concentration of SARS-CoV-2 viral copies this assay can detect is  250 copies / mL. A negative result does not preclude SARS-CoV-2 infection and should not be used as the sole basis for treatment or other patient management decisions.  A negative result may occur with improper specimen collection / handling, submission of specimen other than nasopharyngeal swab, presence of viral mutation(s) within the areas targeted by this assay, and inadequate number of viral copies (<250 copies / mL). A negative result must be combined with clinical observations, patient history, and epidemiological information.  Fact Sheet for Patients:   RoadLapTop.co.za  Fact Sheet for Healthcare Providers: http://kim-miller.com/  This test is not yet approved or  cleared by the [place] FDA and has been authorized for detection and/or diagnosis of SARS-CoV-2 by FDA under an Emergency Use Authorization (EUA).  This EUA will remain in effect (meaning this test can be used) for the duration of the COVID-19 declaration under Section 564(b)(1) of the Act, 21 U.S.C. section 360bbb-3(b)(1), unless the authorization is terminated or revoked sooner.  Performed at [facility], [address] [place], [place] [address]   Respiratory (~20 pathogens) panel by PCR     Status: None   Collection Time: 02/11/23  1:37 PM   Specimen: Anterior Nasal Swab; Respiratory  Result Value Ref Range Status   Adenovirus NOT DETECTED NOT DETECTED Final   Coronavirus 229E NOT DETECTED NOT DETECTED Final    Comment: (NOTE) The Coronavirus on the Respiratory Panel, DOES NOT test for the novel  Coronavirus (2019 nCoV)    Coronavirus HKU1 NOT DETECTED NOT DETECTED Final   Coronavirus NL63 NOT DETECTED NOT DETECTED Final   Coronavirus OC43 NOT DETECTED NOT DETECTED Final   Metapneumovirus NOT DETECTED NOT DETECTED Final   Rhinovirus / Enterovirus NOT DETECTED NOT DETECTED Final   Influenza A NOT DETECTED NOT DETECTED Final   Influenza B NOT  DETECTED NOT DETECTED Final   Parainfluenza Virus 1 NOT DETECTED NOT DETECTED Final   Parainfluenza Virus 2 NOT DETECTED NOT DETECTED Final   Parainfluenza Virus 3 NOT DETECTED NOT DETECTED Final   Parainfluenza Virus 4 NOT DETECTED NOT DETECTED Final   Respiratory Syncytial Virus NOT DETECTED NOT DETECTED Final   Bordetella pertussis NOT DETECTED NOT DETECTED Final   Bordetella Parapertussis NOT DETECTED NOT DETECTED Final   Chlamydophila pneumoniae NOT DETECTED NOT DETECTED Final   Mycoplasma pneumoniae NOT DETECTED NOT DETECTED Final    Comment: Performed at [facility], [address] [place], [place] [address]  Culture, blood (Routine X 2) w Reflex to ID Panel     Status: None (Preliminary result)   Collection Time: 02/11/23  2:24 PM   Specimen: BLOOD  Result Value Ref Range Status   Specimen Description BLOOD BLOOD RIGHT ARM  Final   Special Requests   Final    BOTTLES DRAWN AEROBIC AND ANAEROBIC Blood Culture results may not be optimal due to an inadequate volume of blood received in culture bottles   Culture   Final    NO GROWTH < 24 HOURS Performed at [facility], [address] [place], [place] [address]    Report Status PENDING  Incomplete  MRSA Next Gen by PCR, Nasal     Status: None  Collection Time: 02/11/23  4:43 PM   Specimen: Nasal Mucosa; Nasal Swab  Result Value Ref Range Status   MRSA by PCR Next Gen NOT DETECTED NOT DETECTED Final    Comment: (NOTE) The GeneXpert MRSA Assay (FDA approved for NASAL specimens only), is one component of a comprehensive MRSA colonization surveillance program. It is not intended to diagnose MRSA infection nor to guide or monitor treatment for MRSA infections. Test performance is not FDA approved in patients less than 32 years old. Performed at [facility], [address], [place], [place] [address]     Anti-infectives:  Anti-infectives (From admission, onward)    Start     Dose/Rate Route  Frequency Ordered Stop   02/13/23 1000  Vancomycin (VANCOCIN) 1,500 mg in sodium chloride 0.9 % 500 mL IVPB        1,500 mg 250 mL/hr over 120 Minutes Intravenous Every 36 hours 02/11/23 1845     02/12/23 1000  doxycycline (VIBRAMYCIN) 100 mg in dextrose 5 % 250 mL IVPB        100 mg 125 mL/hr over 120 Minutes Intravenous Every 12 hours 02/11/23 2028     02/12/23 0600  ceFEPIme (MAXIPIME) 2 g in sodium chloride 0.9 % 100 mL IVPB        2 g 200 mL/hr over 30 Minutes Intravenous Every 12 hours 02/11/23 1845     02/11/23 1945  vancomycin (VANCOCIN) IVPB 1000 mg/200 mL premix        1,000 mg 200 mL/hr over 60 Minutes Intravenous  Once 02/11/23 1845 02/11/23 2102   02/11/23 1800  vancomycin (VANCOCIN) IVPB 1000 mg/200 mL premix        1,000 mg 200 mL/hr over 60 Minutes Intravenous  Once 02/11/23 1708 02/11/23 1948   02/11/23 1800  ceFEPIme (MAXIPIME) 2 g in sodium chloride 0.9 % 100 mL IVPB        2 g 200 mL/hr over 30 Minutes Intravenous  Once 02/11/23 1708 02/11/23 1841   02/11/23 1415  cefTRIAXone (ROCEPHIN) 2 g in sodium chloride 0.9 % 100 mL IVPB  Status:  Discontinued        2 g 200 mL/hr over 30 Minutes Intravenous Every 24 hours 02/11/23 1402 02/11/23 1708   02/11/23 1415  azithromycin (ZITHROMAX) 500 mg in sodium chloride 0.9 % 250 mL IVPB  Status:  Discontinued        500 mg 250 mL/hr over 60 Minutes Intravenous Every 24 hours 02/11/23 1402 02/11/23 2028       PAST MEDICAL HISTORY   Past Medical History:  Diagnosis Date   Abnormal thyroid biopsy 2018   results were negative.   Anemia    "when I was alot younger" (12/06/2017)   Anxiety    self reported   Arthritis    "almost all over; used to cry w/it when I was in my teens" (12/06/2017)   CHF (congestive heart failure) (HCC)    Chronic back pain    "all over" (12/06/2017)   Depression    "lost my son last year to cancer; I tended to him; he lived w/me" (12/06/2017)   Diverticulosis of colon (without mention of  hemorrhage)    External hemorrhoids without mention of complication    Fatty liver disease, nonalcoholic    Fibromyalgia    History of kidney stones    History of stomach ulcers 1970   Hyperlipemia    Hypertension    IBS (irritable bowel syndrome)    Morbid obesity (  HCC)    Psoriasis    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   26 mm Medtronic Evolut Pro transcatheter heart valve placed via percutaneous right transfemoral approach    Severe aortic stenosis    Sinus headache    Sleep apnea    "was told I do; never have had any problems w/it" (12/06/2017)   Type II diabetes mellitus (HCC)      SURGICAL HISTORY   Past Surgical History:  Procedure Laterality Date   ABDOMINAL ADHESION SURGERY     "took 2 out"   ABDOMINAL HYSTERECTOMY     AMPUTATION Left 01/23/2022   Procedure: AMPUTATION DIGIT;  Surgeon: Candelaria Stagers, DPM;  Location: [facility];  Service: Podiatry;  Laterality: Left;   AMPUTATION TOE Right 02/12/2020   Procedure: Right hallux and second toe amputation;  Surgeon: Toni Arthurs, MD;  Location: [facility];  Service: Orthopedics;  Laterality: Right;   APPENDECTOMY     BACK SURGERY     BIOPSY  08/05/2020   Procedure: BIOPSY;  Surgeon: Charlott Rakes, MD;  Location: [facility];  Service: Endoscopy;;   BIOPSY THYROID  2018   results were negative.   BREAST LUMPECTOMY Left X 1   "benign"   CATARACT EXTRACTION W/ INTRAOCULAR LENS  IMPLANT, BILATERAL Bilateral    CATARACT EXTRACTION W/ INTRAOCULAR LENS IMPLANT Bilateral 2017   COLONOSCOPY WITH PROPOFOL N/A 08/05/2020   Procedure: COLONOSCOPY WITH PROPOFOL;  Surgeon: Charlott Rakes, MD;  Location: [facility];  Service: Endoscopy;  Laterality: N/A;   ESOPHAGOGASTRODUODENOSCOPY (EGD) WITH PROPOFOL N/A 08/05/2020   Procedure: ESOPHAGOGASTRODUODENOSCOPY (EGD) WITH PROPOFOL;  Surgeon: Charlott Rakes, MD;  Location: [facility];  Service: Endoscopy;  Laterality: N/A;   EYE SURGERY      HEMORRHOID BANDING     LAPAROSCOPIC CHOLECYSTECTOMY     LEFT AND RIGHT HEART CATHETERIZATION WITH CORONARY ANGIOGRAM N/A 11/15/2013   Procedure: LEFT AND RIGHT HEART CATHETERIZATION WITH CORONARY ANGIOGRAM;  Surgeon: Micheline Chapman, MD;  Location: [facility];  Service: Cardiovascular;  Laterality: N/A;   LUMBAR DISC SURGERY     POLYPECTOMY  08/05/2020   Procedure: POLYPECTOMY;  Surgeon: Charlott Rakes, MD;  Location: [facility];  Service: Endoscopy;;   RETINAL DETACHMENT SURGERY Right    RIGHT/LEFT HEART CATH AND CORONARY ANGIOGRAPHY N/A 11/02/2017   Procedure: RIGHT/LEFT HEART CATH AND CORONARY ANGIOGRAPHY;  Surgeon: Kathleene Hazel, MD;  Location: [facility];  Service: Cardiovascular;  Laterality: N/A;   TEE WITHOUT CARDIOVERSION N/A 12/06/2017   Procedure: TRANSESOPHAGEAL ECHOCARDIOGRAM (TEE);  Surgeon: Tonny Bollman, MD;  Location: [facility] OR;  Service: Open Heart Surgery;  Laterality: N/A;   TONSILLECTOMY     TRANSCATHETER AORTIC VALVE REPLACEMENT, TRANSFEMORAL  12/06/2017   TRANSCATHETER AORTIC VALVE REPLACEMENT, TRANSFEMORAL N/A 12/06/2017   Procedure: TRANSCATHETER AORTIC VALVE REPLACEMENT, TRANSFEMORAL;  Surgeon: Tonny Bollman, MD;  Location[facility];  Service: Open Heart Surgery;  Laterality: N/A;   TRANSESOPHAGEAL ECHOCARDIOGRAM  12/06/2017     FAMILY HISTORY   Family History  Problem Relation Age of Onset   Lung cancer Mother    Diabetes Maternal Grandmother    Heart disease Maternal Grandfather    Colon cancer Neg Hx      SOCIAL HISTORY   Social History   Tobacco Use   Smoking status: Former    Current packs/day: 0.00    Average packs/day: 1.8 packs/day for 32.0 years (56.0 ttl pk-yrs)    Types: Cigarettes    Start date: 48  Quit date: 2002    Years since quitting: 22.9   Smokeless tobacco: Never  Vaping Use   Vaping status: Never Used  Substance Use Topics   Alcohol use: No   Drug use: Never     MEDICATIONS   Current  Medication:  Current Facility-Administered Medications:    0.9 %  sodium chloride infusion, 250 mL, Intravenous, Continuous, Chesley Noon, MD, Stopped at 02/12/23 0910   ceFEPIme (MAXIPIME) 2 g in sodium chloride 0.9 % 100 mL IVPB, 2 g, Intravenous, Q12H, Foye Deer, RPH, Stopped at 02/12/23 0556   Chlorhexidine Gluconate Cloth 2 % PADS 6 each, 6 each, Topical, Daily, Vida Rigger, MD, 6 each at 02/12/23 0902   docusate (COLACE) 50 MG/5ML liquid 100 mg, 100 mg, Per Tube, BID PRN, Vida Rigger, MD   docusate (COLACE) 50 MG/5ML liquid 100 mg, 100 mg, Per Tube, BID, Harlon Ditty D, NP, 100 mg at 02/12/23 0929   doxycycline (VIBRAMYCIN) 100 mg in dextrose 5 % 250 mL IVPB, 100 mg, Intravenous, Q12H, Rust-Chester, Cecelia Byars, NP, Last Rate: 125 mL/hr at 02/12/23 0935, Infusion Verify at 02/12/23 0935   fentaNYL (SUBLIMAZE) injection 25 mcg, 25 mcg, Intravenous, Q15 min PRN, Chesley Noon, MD   fentaNYL (SUBLIMAZE) injection 25-100 mcg, 25-100 mcg, Intravenous, Q30 min PRN, Chesley Noon, MD, 50 mcg at 02/11/23 1622   furosemide (LASIX) injection 40 mg, 40 mg, Intravenous, BID, Dunn, Ryan M, PA-C   heparin ADULT infusion 100 units/mL (25000 units/265mL), 1,150 Units/hr, Intravenous, Continuous, Aulden Calise, MD, Last Rate: 11.5 mL/hr at 02/12/23 0935, 1,150 Units/hr at 02/12/23 0935   insulin aspart (novoLOG) injection 0-15 Units, 0-15 Units, Subcutaneous, Q4H, Rust-Chester, Britton L, NP, 3 Units at 02/12/23 0854   midazolam (VERSED) injection 1-2 mg, 1-2 mg, Intravenous, Q1H PRN, Chesley Noon, MD, 2 mg at 02/11/23 1610   norepinephrine (LEVOPHED) 4mg  in (0.016 mg/mL) premix infusion, 0-40 mcg/min, Intravenous, Titrated, Rust-Chester, Micheline Rough L, NP, Last Rate: 7.5 mL/hr at 02/12/23 0935, 2 mcg/min at 02/12/23 0935   Oral care mouth rinse, 15 mL, Mouth Rinse, Q2H, Sydney Azure, MD, 15 mL at 02/12/23 1122   Oral care mouth rinse, 15 mL, Mouth Rinse, PRN, Vida Rigger,  MD   pantoprazole (PROTONIX) injection 40 mg, 40 mg, Intravenous, Q24H, Ainslie Mazurek, MD, 40 mg at 02/11/23 1507   polyethylene glycol (MIRALAX / GLYCOLAX) packet 17 g, 17 g, Per Tube, Daily PRN, Vida Rigger, MD   polyethylene glycol (MIRALAX / GLYCOLAX) packet 17 g, 17 g, Per Tube, Daily, Harlon Ditty D, NP, 17 g at 02/12/23 0929   propofol (DIPRIVAN) 1000 MG/100ML infusion, 0-50 mcg/kg/min, Intravenous, Titrated, Rust-Chester, Britton L, NP, Last Rate: 22.6 mL/hr at 02/12/23 0935, 30 mcg/kg/min at 02/12/23 0935   [START ON 02/13/2023] Vancomycin (VANCOCIN) 1,500 mg in sodium chloride 0.9 % 500 mL IVPB, 1,500 mg, Intravenous, Q36H, Foye Deer, RPH    ALLERGIES   Castor oil, Sulfonamide derivatives, Amoxicillin, Demerol [meperidine], and Hydrocodone    REVIEW OF SYSTEMS     Unable to obtain ROS due to mechanical ventilation.   PHYSICAL EXAMINATION   Vital Signs: Temp:  [97 F (36.1 C)-100 F (37.8 C)] 100 F (37.8 C) (12/21 1000) Pulse Rate:  [45-94] 59 (12/21 1000) Resp:  [11-23] 20 (12/21 1000) BP: (78-122)/(48-75) 104/59 (12/21 0800) SpO2:  [60 %-100 %] 100 % (12/21 1000) Arterial Line BP: (84-142)/(42-75) 119/46 (12/21 1000) FiO2 (%):  [28 %-40 %] 28 % (12/21 1100) Weight:  [125.4 kg-131.6 kg] 131.6  kg (12/20 1700)  GENERAL:sedated on MV HEAD: Normocephalic, atraumatic.  EYES: Pupils equal, round, reactive to light.  No scleral icterus.  MOUTH: Moist mucosal membrane. NECK: Supple. No thyromegaly. No nodules. No JVD.  PULMONARY: wheezing and rhonchi CARDIOVASCULAR: S1 and S2. Regular rate and rhythm. No murmurs, rubs, or gallops.  GASTROINTESTINAL: Soft, nontender, non-distended. No masses. Positive bowel sounds. No hepatosplenomegaly.  MUSCULOSKELETAL: No swelling, clubbing, or edema.  NEUROLOGIC: Mild distress due to acute illness SKIN:intact,warm,dry   PERTINENT DATA     Infusions:  sodium chloride Stopped (02/12/23 0910)   ceFEPime  (MAXIPIME) IV Stopped (02/12/23 0556)   doxycycline (VIBRAMYCIN) IV 125 mL/hr at 02/12/23 0935   heparin 1,150 Units/hr (02/12/23 0935)   norepinephrine (LEVOPHED) Adult infusion 2 mcg/min (02/12/23 0935)   propofol (DIPRIVAN) infusion 30 mcg/kg/min (02/12/23 0935)   [START ON 02/13/2023] vancomycin     Scheduled Medications:  Chlorhexidine Gluconate Cloth  6 each Topical Daily   docusate  100 mg Per Tube BID   furosemide  40 mg Intravenous BID   insulin aspart  0-15 Units Subcutaneous Q4H   mouth rinse  15 mL Mouth Rinse Q2H   pantoprazole (PROTONIX) IV  40 mg Intravenous Q24H   polyethylene glycol  17 g Per Tube Daily   PRN Medications: docusate, fentaNYL (SUBLIMAZE) injection, fentaNYL (SUBLIMAZE) injection, midazolam, mouth rinse, polyethylene glycol Hemodynamic parameters:   Intake/Output: 12/20 0701 - 12/21 0700 In: 1571.4 [I.V.:849.2; IV Piggyback:722.2] Out: 545 [Urine:545]  Ventilator  Settings: Vent Mode: PRVC FiO2 (%):  [28 %-40 %] 28 % Set Rate:  [20 bmp] 20 bmp Vt Set:  [450 mL] 450 mL PEEP:  [5 cmH20] 5 cmH20 Plateau Pressure:  [20 cmH20-25 cmH20] 20 cmH20   LAB RESULTS:  Basic Metabolic Panel: Recent Labs  Lab 02/11/23 1134 02/11/23 1931 02/12/23 0405  NA 139 134* 135  K 2.9* 5.0 4.5  CL 111 101 103  CO2 21* 24 25  GLUCOSE 270* 316* 224*  BUN 21 28* 28*  CREATININE 1.11* 1.40* 1.31*  CALCIUM 6.1* 7.7* 7.8*  MG 1.8 2.5* 2.4  PHOS  --   --  3.0   Liver Function Tests: Recent Labs  Lab 02/11/23 1134 02/12/23 0405  AST 33  --   ALT 16  --   ALKPHOS 65  --   BILITOT 0.5  --   PROT 5.2*  --   ALBUMIN 2.2* 2.5*   No results for input(s): "LIPASE", "AMYLASE" in the last 168 hours. No results for input(s): "AMMONIA" in the last 168 hours. CBC: Recent Labs  Lab 02/11/23 1023 02/12/23 0405  WBC 28.5* 11.1*  NEUTROABS 8.2*  --   HGB 11.9* 10.0*  HCT 41.6 31.9*  MCV 97.9 91.1  PLT 195 166   Cardiac Enzymes: No results for input(s):  "CKTOTAL", "CKMB", "CKMBINDEX", "TROPONINI" in the last 168 hours. BNP: Invalid input(s): "POCBNP" CBG: Recent Labs  Lab 02/11/23 2009 02/11/23 2344 02/12/23 0406 02/12/23 0831 02/12/23 1125  GLUCAP 266* 200* 201* 188* 188*        ASSESSMENT AND PLAN    -Multidisciplinary rounds held today  Acute Hypoxic Respiratory Failure -liberated from MV -02/12/23 -RVP and COVID/flu/RSV testing  -tracheal aspirate  -BNP-moderate elevation with improvement  -procalcitonin trend -MRSA PCR   Bilateral LE edema with cellulits S/p treatment last admission  -podiatry evaluation  -currently erythematous indurated bilateral swelling suspicious for cellulitis   Crhonic Renal Failure- stage 2 -follow chem 7 -follow UO -continue Foley Catheter-assess need daily  NEUROLOGY - intubated and sedated - minimal sedation to achieve a RASS goal: -1 Wake up assessment pending   Septic shock- present on admission Due to UTI   important suggestion  Newer results are available. Click to view them now.      Component Ref Range & Units (hover) 1 d ago  Specimen Description URINE, CLEAN CATCH Performed at [facility], [address], [place], [place] [address]  Special Requests NONE Performed at [facility], [address] [place], [place] [address]  Culture  Abnormal  >=100,000 COLONIES/mL ESCHERICHIA COLI SUSCEPTIBILITIES TO FOLLOW Performed at [facility], [address], [place], [place] [address]        ID -continue IV abx as prescibed -follow up cultures  GI/Nutrition GI PROPHYLAXIS as indicated DIET-->TF's as tolerated Constipation protocol as indicated  ENDO - ICU hypoglycemic\Hyperglycemia protocol -check FSBS per protocol   ELECTROLYTES -follow labs as needed -replace as needed -pharmacy consultation   DVT/GI PRX ordered -SCDs  TRANSFUSIONS AS NEEDED MONITOR FSBS ASSESS the need for LABS as needed    Critical care  provider statement:   Total critical care time: 34 minutes   Performed by: Karna Christmas MD   Critical care time was exclusive of separately billable procedures and treating other patients.   Critical care was necessary to treat or prevent imminent or life-threatening deterioration.   Critical care was time spent personally by me on the following activities: development of treatment plan with patient and/or surrogate as well as nursing, discussions with consultants, evaluation of patient's response to treatment, examination of patient, obtaining history from patient or surrogate, ordering and performing treatments and interventions, ordering and review of laboratory studies, ordering and review of radiographic studies, pulse oximetry and re-evaluation of patient's condition.    Vida Rigger, M.D.  Pulmonary & Critical Care Medicine

## 2023-02-12 NOTE — Progress Notes (Signed)
Pt extubated with no complications. Sats 95% on roomair. Respiratory rate 18/min.

## 2023-02-12 NOTE — Progress Notes (Signed)
PHARMACY - ANTICOAGULATION CONSULT NOTE  Pharmacy Consult for Heparin Infusion Indication: chest pain/ACS  Allergies  Allergen Reactions   Castor Oil     "passed out" ? SYNCOPE ?   Sulfonamide Derivatives     Other Reaction(s): Not available   Amoxicillin Diarrhea and Other (See Comments)    Has patient had a PCN reaction causing immediate rash, facial/tongue/throat swelling, SOB or lightheadedness with hypotension: No Has patient had a PCN reaction causing severe rash involving mucus membranes or skin necrosis: No Has patient had a PCN reaction that required hospitalization: No Has patient had a PCN reaction occurring within the last 10 years: No If all of the above answers are "NO", then may proceed with Cephalosporin use.    Demerol [Meperidine] Nausea And Vomiting   Hydrocodone Nausea And Vomiting    Patient Measurements: Height: 5' 2.01" (157.5 cm) Weight: 131.6 kg (290 lb 2 oz) IBW/kg (Calculated) : 50.12 Heparin Dosing Weight: 83.3 kg  Vital Signs: Temp: 99.5 F (37.5 C) (12/21 0315) Temp Source: Bladder (12/21 0000) BP: 96/50 (12/21 0000) Pulse Rate: 59 (12/21 0315)  Labs: Recent Labs    02/11/23 1023 02/11/23 1134 02/11/23 1216 02/11/23 1931 02/11/23 2219 02/12/23 0008 02/12/23 0224 02/12/23 0405  HGB 11.9*  --   --   --   --   --   --   --   HCT 41.6  --   --   --   --   --   --   --   PLT 195  --   --   --   --   --   --   --   APTT  --   --   --  57*  --   --   --   --   LABPROT  --   --   --  15.3*  --   --   --   --   INR  --   --   --  1.2  --   --   --   --   HEPARINUNFRC  --   --   --   --   --   --   --  0.14*  CREATININE  --  1.11*  --  1.40*  --   --   --   --   TROPONINIHS 45*  --    < > 1,098* 1,153* 1,078* 1,028*  --    < > = values in this interval not displayed.    Estimated Creatinine Clearance: 41.8 mL/min (A) (by C-G formula based on SCr of 1.4 mg/dL (H)).   Medical History: Past Medical History:  Diagnosis Date   Abnormal  thyroid biopsy 2018   results were negative.   Anemia    "when I was alot younger" (12/06/2017)   Anxiety    self reported   Arthritis    "almost all over; used to cry w/it when I was in my teens" (12/06/2017)   CHF (congestive heart failure) (HCC)    Chronic back pain    "all over" (12/06/2017)   Depression    "lost my son last year to cancer; I tended to him; he lived w/me" (12/06/2017)   Diverticulosis of colon (without mention of hemorrhage)    External hemorrhoids without mention of complication    Fatty liver disease, nonalcoholic    Fibromyalgia    History of kidney stones    History of stomach ulcers 1970   Hyperlipemia  Hypertension    IBS (irritable bowel syndrome)    Morbid obesity (HCC)    Psoriasis    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 She was found to have an elevated troponin level of 1,035. Pharmacy was consulted to initiate patient on heparin infusion for ACS/STEMI. Patient was not on anticoagulation prior to arrival.   Baseline INR and aPTT ordered.  No signs/symptoms of bleeding noted in chart. Hgb 11.9. PLT 195.  Goal of Therapy:  Heparin level 0.3-0.7 units/ml Monitor platelets by anticoagulation protocol: Yes   Plan:  12/21:  HL @ 0405 = 0.14, SUBtherapeutic - Will order heparin 2500 units IV X 1 and increase drip rate to 1150 units/hr - Will recheck HL 8 hrs after rate change  Mazzy Santarelli D, PharmD Clinical  Pharmacist 02/12/2023,4:38 AM

## 2023-02-12 NOTE — Progress Notes (Signed)
  Echocardiogram 2D Echocardiogram has been performed. Definity IV ultrasound imaging agent used on this study.  [name] 02/12/2023, 8:39 AM

## 2023-02-13 DIAGNOSIS — R7989 Other specified abnormal findings of blood chemistry: Secondary | ICD-10-CM | POA: Diagnosis not present

## 2023-02-13 DIAGNOSIS — J9601 Acute respiratory failure with hypoxia: Secondary | ICD-10-CM | POA: Diagnosis not present

## 2023-02-13 DIAGNOSIS — R079 Chest pain, unspecified: Secondary | ICD-10-CM

## 2023-02-13 DIAGNOSIS — R579 Shock, unspecified: Secondary | ICD-10-CM | POA: Diagnosis not present

## 2023-02-13 DIAGNOSIS — I350 Nonrheumatic aortic (valve) stenosis: Secondary | ICD-10-CM

## 2023-02-13 LAB — RENAL FUNCTION PANEL
Albumin: 2.6 g/dL — ABNORMAL LOW (ref 3.5–5.0)
Anion gap: 10 (ref 5–15)
BUN: 28 mg/dL — ABNORMAL HIGH (ref 8–23)
CO2: 25 mmol/L (ref 22–32)
Calcium: 7.7 mg/dL — ABNORMAL LOW (ref 8.9–10.3)
Chloride: 104 mmol/L (ref 98–111)
Creatinine, Ser: 1.53 mg/dL — ABNORMAL HIGH (ref 0.44–1.00)
GFR, Estimated: 34 mL/min — ABNORMAL LOW (ref >60)
Glucose, Bld: 195 mg/dL — ABNORMAL HIGH (ref 70–99)
Phosphorus: 3.4 mg/dL (ref 2.5–4.6)
Potassium: 3.6 mmol/L (ref 3.5–5.1)
Sodium: 139 mmol/L (ref 135–145)

## 2023-02-13 LAB — CBC
HCT: 31.3 % — ABNORMAL LOW (ref 36.0–46.0)
Hemoglobin: 9.8 g/dL — ABNORMAL LOW (ref 12.0–15.0)
MCH: 28.6 pg (ref 26.0–34.0)
MCHC: 31.3 g/dL (ref 30.0–36.0)
MCV: 91.3 fL (ref 80.0–100.0)
Platelets: 152 10*3/uL (ref 150–400)
RBC: 3.43 MIL/uL — ABNORMAL LOW (ref 3.87–5.11)
RDW: 15.6 % — ABNORMAL HIGH (ref 11.5–15.5)
WBC: 12.1 10*3/uL — ABNORMAL HIGH (ref 4.0–10.5)
nRBC: 0 % (ref 0.0–0.2)

## 2023-02-13 LAB — URINE CULTURE: Culture: >=100000 — AB

## 2023-02-13 LAB — LEGIONELLA PNEUMOPHILA SEROGP 1 UR AG: L. pneumophila Serogp 1 Ur Ag: NEGATIVE

## 2023-02-13 LAB — GLUCOSE, CAPILLARY
Glucose-Capillary: 175 mg/dL — ABNORMAL HIGH (ref 70–99)
Glucose-Capillary: 159 mg/dL — ABNORMAL HIGH (ref 70–99)
Glucose-Capillary: 140 mg/dL — ABNORMAL HIGH (ref 70–99)
Glucose-Capillary: 114 mg/dL — ABNORMAL HIGH (ref 70–99)
Glucose-Capillary: 143 mg/dL — ABNORMAL HIGH (ref 70–99)

## 2023-02-13 LAB — PROCALCITONIN: Procalcitonin: 8.61 ng/mL

## 2023-02-13 LAB — HEPARIN LEVEL (UNFRACTIONATED): Heparin Unfractionated: 0.31 [IU]/mL (ref 0.30–0.70)

## 2023-02-13 LAB — MAGNESIUM: Magnesium: 2.3 mg/dL (ref 1.7–2.4)

## 2023-02-13 MED ORDER — POTASSIUM CHLORIDE 20 MEQ PO PACK
40.0000 meq | PACK | Freq: Once | ORAL | Status: AC
  Administered 2023-02-13: 40 meq via ORAL
  Filled 2023-02-13: qty 2

## 2023-02-13 MED ORDER — DOXYCYCLINE HYCLATE 100 MG PO TABS
100.0000 mg | ORAL_TABLET | Freq: Two times a day (BID) | ORAL | Status: AC
  Administered 2023-02-14 – 2023-02-16 (×6): 100 mg via ORAL
  Filled 2023-02-13 (×6): qty 1

## 2023-02-13 MED ORDER — CEFUROXIME AXETIL 250 MG PO TABS
500.0000 mg | ORAL_TABLET | Freq: Two times a day (BID) | ORAL | Status: AC
  Administered 2023-02-13 – 2023-02-16 (×6): 500 mg via ORAL
  Filled 2023-02-13: qty 2
  Filled 2023-02-13 (×2): qty 1
  Filled 2023-02-13: qty 2
  Filled 2023-02-13: qty 1
  Filled 2023-02-13: qty 2
  Filled 2023-02-13 (×2): qty 1

## 2023-02-13 MED ORDER — MORPHINE SULFATE (PF) 2 MG/ML IV SOLN
2.0000 mg | Freq: Once | INTRAVENOUS | Status: AC
  Administered 2023-02-13: 2 mg via INTRAVENOUS
  Filled 2023-02-13: qty 1

## 2023-02-13 MED ORDER — POTASSIUM CHLORIDE CRYS ER 20 MEQ PO TBCR: 40.0000 meq | EXTENDED_RELEASE_TABLET | Freq: Once | ORAL | Status: DC

## 2023-02-13 MED ORDER — POTASSIUM CHLORIDE 10 MEQ/50ML IV SOLN
10.0000 meq | INTRAVENOUS | Status: DC
  Filled 2023-02-13 (×4): qty 50

## 2023-02-13 MED ORDER — ATORVASTATIN CALCIUM 80 MG PO TABS
40.0000 mg | ORAL_TABLET | Freq: Every day | ORAL | Status: DC
  Administered 2023-02-13 – 2023-03-02 (×18): 40 mg via ORAL
  Filled 2023-02-13: qty 1
  Filled 2023-02-13: qty 2
  Filled 2023-02-13: qty 1
  Filled 2023-02-13 (×2): qty 2
  Filled 2023-02-13 (×2): qty 1
  Filled 2023-02-13 (×3): qty 2
  Filled 2023-02-13: qty 1
  Filled 2023-02-13: qty 2
  Filled 2023-02-13 (×5): qty 1
  Filled 2023-02-13: qty 2

## 2023-02-13 NOTE — Progress Notes (Signed)
CRITICAL CARE    Name: [name] MRN: [id number] DOB: 11/27/1940     LOS: 2   SUBJECTIVE FINDINGS & SIGNIFICANT EVENTS    History of Presenting Illness:  This is a 82 year old female with a history of Essential hypertension, heart failure with preserved LVEF, Severe AS s/p TAVR, hypertension, diabetes type 2, diabetic ulcers, fatty liver disease, GERD, chronic dysphagia, history of colon polyps, was admitted and discharged earlier in dec 2024 with b/l LE cellulits and left toe infected ulcer with underlying fracture of phalanx and osteomyelitis. She apparently was on the way to hospital for routine appt and found to be hypoxemic in field.  She required intubation for severe hypoxemia in ED. Patient seen in ER with circulatory shock on levophed while intubated.  PCCM consultation for further evaluation and treatment.     02/12/23- patient has been liberated from MV. She reported Chest pain post extubation. Delivered 1mg  morphine and obtained cardiac biomarkers.  Vital signs are stable.  02/13/23-patient remains off mechanical ventilation.  She reports resolution of chest discomfort and has SLP , PT/OT starting today.  She's being optimized for TRH today.   Lines/tubes : Airway 7.5 mm (Active)  Secured at (cm) 24 cm 02/11/23 1018  Measured From Lips 02/11/23 1018  Secured Location Center 02/11/23 1018  Secured By [employer] 02/11/23 1018     NG/OG Vented/Dual Lumen 16 Fr. Oral Marking at nare/corner of mouth (Active)  Tube Position (Required) Marking at nare/corner of mouth 02/11/23 1022     Urethral Catheter Vet, RN Temperature probe 14 Fr. (Active)  Indication for Insertion or Continuance of Catheter Unstable critically ill patients first 24-48 hours (See Criteria) 02/11/23 1036  Site Assessment  Clean, Dry, Intact 02/11/23 1036  Catheter Maintenance Bag below level of bladder;Catheter secured;Insertion date on drainage bag;Drainage bag/tubing not touching floor;No dependent loops;Seal intact 02/11/23 1036  Collection Container Standard drainage bag 02/11/23 1036     External Urinary Catheter (Active)    Microbiology/Sepsis markers: Results for orders placed or performed during the hospital encounter of 02/11/23  Culture, blood (routine x 2)     Status: None (Preliminary result)   Collection Time: 02/11/23 10:44 AM   Specimen: BLOOD RIGHT ARM  Result Value Ref Range Status   Specimen Description BLOOD RIGHT ARM  Final   Special Requests   Final    BOTTLES DRAWN AEROBIC AND ANAEROBIC Blood Culture results may not be optimal due to an inadequate volume of blood received in culture bottles   Culture   Final    NO GROWTH 2 DAYS Performed at [facility], [address] [place], [place] [address]    Report Status PENDING  Incomplete  Culture, blood (routine x 2)     Status: None (Preliminary result)   Collection Time: 02/11/23 10:46 AM   Specimen: BLOOD  Result Value Ref Range Status   Specimen Description BLOOD BLOOD LEFT FOREARM  Final   Special Requests   Final    BOTTLES DRAWN AEROBIC AND ANAEROBIC Blood Culture results may not be optimal due to an inadequate volume of blood received in culture bottles   Culture   Final    NO GROWTH 2 DAYS Performed at [facility], [address] [place], [place] [address]    Report Status PENDING  Incomplete  SARS Coronavirus 2 by RT PCR (hospital order, performed in [facility] hospital lab) *cepheid single result test* Anterior Nasal Swab     Status: None  Collection Time: 02/11/23  1:37 PM   Specimen: Anterior Nasal Swab  Result Value Ref Range Status   SARS Coronavirus 2 by RT PCR NEGATIVE NEGATIVE Final    Comment: (NOTE) SARS-CoV-2 target nucleic acids are NOT DETECTED.  The SARS-CoV-2 RNA is generally  detectable in upper and lower respiratory specimens during the acute phase of infection. The lowest concentration of SARS-CoV-2 viral copies this assay can detect is 250 copies / mL. A negative result does not preclude SARS-CoV-2 infection and should not be used as the sole basis for treatment or other patient management decisions.  A negative result may occur with improper specimen collection / handling, submission of specimen other than nasopharyngeal swab, presence of viral mutation(s) within the areas targeted by this assay, and inadequate number of viral copies (<250 copies / mL). A negative result must be combined with clinical observations, patient history, and epidemiological information.  Fact Sheet for Patients:   RoadLapTop.co.za  Fact Sheet for Healthcare Providers: http://kim-miller.com/  This test is not yet approved or  cleared by the [place] FDA and has been authorized for detection and/or diagnosis of SARS-CoV-2 by FDA under an Emergency Use Authorization (EUA).  This EUA will remain in effect (meaning this test can be used) for the duration of the COVID-19 declaration under Section 564(b)(1) of the Act, 21 U.S.C. section 360bbb-3(b)(1), unless the authorization is terminated or revoked sooner.  Performed at [facility], [address] [place], [place] [address]   Respiratory (~20 pathogens) panel by PCR     Status: None   Collection Time: 02/11/23  1:37 PM   Specimen: Anterior Nasal Swab; Respiratory  Result Value Ref Range Status   Adenovirus NOT DETECTED NOT DETECTED Final   Coronavirus 229E NOT DETECTED NOT DETECTED Final    Comment: (NOTE) The Coronavirus on the Respiratory Panel, DOES NOT test for the novel  Coronavirus (2019 nCoV)    Coronavirus HKU1 NOT DETECTED NOT DETECTED Final   Coronavirus NL63 NOT DETECTED NOT DETECTED Final   Coronavirus OC43 NOT DETECTED NOT DETECTED Final    Metapneumovirus NOT DETECTED NOT DETECTED Final   Rhinovirus / Enterovirus NOT DETECTED NOT DETECTED Final   Influenza A NOT DETECTED NOT DETECTED Final   Influenza B NOT DETECTED NOT DETECTED Final   Parainfluenza Virus 1 NOT DETECTED NOT DETECTED Final   Parainfluenza Virus 2 NOT DETECTED NOT DETECTED Final   Parainfluenza Virus 3 NOT DETECTED NOT DETECTED Final   Parainfluenza Virus 4 NOT DETECTED NOT DETECTED Final   Respiratory Syncytial Virus NOT DETECTED NOT DETECTED Final   Bordetella pertussis NOT DETECTED NOT DETECTED Final   Bordetella Parapertussis NOT DETECTED NOT DETECTED Final   Chlamydophila pneumoniae NOT DETECTED NOT DETECTED Final   Mycoplasma pneumoniae NOT DETECTED NOT DETECTED Final    Comment: Performed at [facility], [address], [place], [place] [address]  Urine Culture     Status: Abnormal   Collection Time: 02/11/23  1:37 PM   Specimen: Urine, Clean Catch  Result Value Ref Range Status   Specimen Description   Final    URINE, CLEAN CATCH Performed at [facility], [address] [place], [place] [address]    Special Requests   Final    NONE Performed at [facility], [address] [place], [place] [address]    Culture >=100,000 COLONIES/mL ESCHERICHIA COLI (A)  Final   Report Status 02/13/2023 FINAL  Final   Organism ID, Bacteria ESCHERICHIA COLI (A)  Final      Susceptibility   Escherichia coli - MIC*    AMPICILLIN <=2 SENSITIVE Sensitive     CEFAZOLIN <=4 SENSITIVE Sensitive     CEFEPIME <=0.12 SENSITIVE Sensitive     CEFTRIAXONE <=0.25 SENSITIVE Sensitive     CIPROFLOXACIN <=0.25 SENSITIVE Sensitive     GENTAMICIN <=1 SENSITIVE Sensitive     IMIPENEM <=0.25 SENSITIVE Sensitive     NITROFURANTOIN <=16 SENSITIVE Sensitive     TRIMETH/SULFA <=20 SENSITIVE Sensitive     AMPICILLIN/SULBACTAM <=2 SENSITIVE Sensitive     PIP/TAZO <=4 SENSITIVE Sensitive ug/mL    * >=100,000 COLONIES/mL ESCHERICHIA COLI  Culture, blood  (Routine X 2) w Reflex to ID Panel     Status: None (Preliminary result)   Collection Time: 02/11/23  2:24 PM   Specimen: BLOOD  Result Value Ref Range Status   Specimen Description BLOOD BLOOD RIGHT ARM  Final   Special Requests   Final    BOTTLES DRAWN AEROBIC AND ANAEROBIC Blood Culture results may not be optimal due to an inadequate volume of blood received in culture bottles   Culture   Final    NO GROWTH 2 DAYS Performed at [facility], [address] [place], [place] [address]    Report Status PENDING  Incomplete  MRSA Next Gen by PCR, Nasal     Status: None   Collection Time: 02/11/23  4:43 PM   Specimen: Nasal Mucosa; Nasal Swab  Result Value Ref Range Status   MRSA by PCR Next Gen NOT DETECTED NOT DETECTED Final    Comment: (NOTE) The GeneXpert MRSA Assay (FDA approved for NASAL specimens only), is one component of a comprehensive MRSA colonization surveillance program. It is not intended to diagnose MRSA infection nor to guide or monitor treatment for MRSA infections. Test performance is not FDA approved in patients less than 30 years old. Performed at [facility], [address] [place], [place] [address]   Culture, Respiratory w Gram Stain     Status: None (Preliminary result)   Collection Time: 02/12/23 11:07 AM   Specimen: Bronchoalveolar Lavage; Respiratory  Result Value Ref Range Status   Specimen Description   Final    BRONCHIAL ALVEOLAR LAVAGE Performed at [facility], [address] [place], [place] [address]    Special Requests   Final    NONE Performed at [facility], [address] [place], [place] [address]    Gram Stain   Final    RARE WBC SEEN RARE GRAM POSITIVE COCCI Performed at [facility], [address], [place], [place] [address]    Culture PENDING  Incomplete   Report Status PENDING  Incomplete    Anti-infectives:  Anti-infectives (From admission, onward)    Start     Dose/Rate  Route Frequency Ordered Stop   02/14/23 1000  doxycycline (VIBRA-TABS) tablet 100 mg        100 mg Oral Every 12 hours 02/13/23 1045 02/17/23 0959   02/13/23 2000  vancomycin (VANCOREADY) IVPB 750 mg/150 mL  Status:  Discontinued        750 mg 150 mL/hr over 60 Minutes Intravenous Every 24 hours 02/12/23 1203 02/12/23 1247   02/13/23 1700  cefUROXime (CEFTIN) tablet 500 mg        500 mg Oral 2 times daily with meals 02/13/23 1044 02/16/23 1659   02/13/23 1000  Vancomycin (VANCOCIN) 1,500 mg in sodium chloride 0.9 % 500 mL IVPB  Status:  Discontinued        1,500 mg 250 mL/hr over 120 Minutes Intravenous Every 36 hours 02/11/23 1845 02/12/23 1203   02/12/23 1000  doxycycline (VIBRAMYCIN) 100 mg in dextrose 5 % 250 mL IVPB        100 mg 125 mL/hr over 120 Minutes Intravenous Every 12 hours 02/11/23 2028 02/13/23 2359   02/12/23 0600  ceFEPIme (MAXIPIME) 2 g in sodium chloride 0.9 % 100 mL IVPB  Status:  Discontinued        2 g 200 mL/hr over 30 Minutes Intravenous Every 12 hours 02/11/23 1845 02/13/23 1044   02/11/23 1945  vancomycin (VANCOCIN) IVPB 1000 mg/200 mL premix        1,000 mg 200 mL/hr over 60 Minutes Intravenous  Once 02/11/23 1845 02/11/23 2102   02/11/23 1800  vancomycin (VANCOCIN) IVPB 1000 mg/200 mL premix        1,000 mg 200 mL/hr over 60 Minutes Intravenous  Once 02/11/23 1708 02/11/23 1948   02/11/23 1800  ceFEPIme (MAXIPIME) 2 g in sodium chloride 0.9 % 100 mL IVPB        2 g 200 mL/hr over 30 Minutes Intravenous  Once 02/11/23 1708 02/11/23 1841   02/11/23 1415  cefTRIAXone (ROCEPHIN) 2 g in sodium chloride 0.9 % 100 mL IVPB  Status:  Discontinued        2 g 200 mL/hr over 30 Minutes Intravenous Every 24 hours 02/11/23 1402 02/11/23 1708   02/11/23 1415  azithromycin (ZITHROMAX) 500 mg in sodium chloride 0.9 % 250 mL IVPB  Status:  Discontinued        500 mg 250 mL/hr over 60 Minutes Intravenous Every 24 hours 02/11/23 1402 02/11/23 2028       PAST MEDICAL  HISTORY   Past Medical History:  Diagnosis Date   Abnormal thyroid biopsy 2018   results were negative.   Anemia    "when I was alot younger" (12/06/2017)   Anxiety    self reported   Arthritis    "almost all over; used to cry w/it when I was in my teens" (12/06/2017)   CHF (congestive heart failure) (HCC)    Chronic back pain    "all over" (12/06/2017)   Depression    "lost my son last year to cancer; I tended to him; he lived w/me" (12/06/2017)   Diverticulosis of colon (without mention of hemorrhage)    External hemorrhoids without mention of complication    Fatty liver disease, nonalcoholic    Fibromyalgia    History of kidney stones    History of stomach ulcers 1970   Hyperlipemia    Hypertension    IBS (irritable bowel syndrome)    Morbid obesity (HCC)    Psoriasis    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   26 mm Medtronic Evolut Pro transcatheter heart valve placed via percutaneous right transfemoral approach    Severe aortic stenosis    Sinus headache    Sleep apnea    "was told I do; never have had any problems w/it" (12/06/2017)   Type II diabetes mellitus (HCC)      SURGICAL HISTORY   Past Surgical History:  Procedure Laterality Date   ABDOMINAL ADHESION SURGERY     "took 2 out"   ABDOMINAL HYSTERECTOMY     AMPUTATION Left 01/23/2022   Procedure: AMPUTATION DIGIT;  Surgeon: Candelaria Stagers, DPM;  Location: [facility];  Service: Podiatry;  Laterality: Left;   AMPUTATION TOE Right 02/12/2020  Procedure: Right hallux and second toe amputation;  Surgeon: Toni Arthurs, MD;  Location: [facility];  Service: Orthopedics;  Laterality: Right;   APPENDECTOMY     BACK SURGERY     BIOPSY  08/05/2020   Procedure: BIOPSY;  Surgeon: Charlott Rakes, MD;  Location: [facility];  Service: Endoscopy;;   BIOPSY THYROID  2018   results were negative.   BREAST LUMPECTOMY Left X 1   "benign"   CATARACT EXTRACTION W/ INTRAOCULAR LENS   IMPLANT, BILATERAL Bilateral    CATARACT EXTRACTION W/ INTRAOCULAR LENS IMPLANT Bilateral 2017   COLONOSCOPY WITH PROPOFOL N/A 08/05/2020   Procedure: COLONOSCOPY WITH PROPOFOL;  Surgeon: Charlott Rakes, MD;  Location: [facility];  Service: Endoscopy;  Laterality: N/A;   ESOPHAGOGASTRODUODENOSCOPY (EGD) WITH PROPOFOL N/A 08/05/2020   Procedure: ESOPHAGOGASTRODUODENOSCOPY (EGD) WITH PROPOFOL;  Surgeon: Charlott Rakes, MD;  Location: [facility];  Service: Endoscopy;  Laterality: N/A;   EYE SURGERY     HEMORRHOID BANDING     LAPAROSCOPIC CHOLECYSTECTOMY     LEFT AND RIGHT HEART CATHETERIZATION WITH CORONARY ANGIOGRAM N/A 11/15/2013   Procedure: LEFT AND RIGHT HEART CATHETERIZATION WITH CORONARY ANGIOGRAM;  Surgeon: Micheline Chapman, MD;  Location: [facility];  Service: Cardiovascular;  Laterality: N/A;   LUMBAR DISC SURGERY     POLYPECTOMY  08/05/2020   Procedure: POLYPECTOMY;  Surgeon: Charlott Rakes, MD;  Location: [facility];  Service: Endoscopy;;   RETINAL DETACHMENT SURGERY Right    RIGHT/LEFT HEART CATH AND CORONARY ANGIOGRAPHY N/A 11/02/2017   Procedure: RIGHT/LEFT HEART CATH AND CORONARY ANGIOGRAPHY;  Surgeon: Kathleene Hazel, MD;  Location: [facility];  Service: Cardiovascular;  Laterality: N/A;   TEE WITHOUT CARDIOVERSION N/A 12/06/2017   Procedure: TRANSESOPHAGEAL ECHOCARDIOGRAM (TEE);  Surgeon: Tonny Bollman, MD;  Location: [facility] OR;  Service: Open Heart Surgery;  Laterality: N/A;   TONSILLECTOMY     TRANSCATHETER AORTIC VALVE REPLACEMENT, TRANSFEMORAL  12/06/2017   TRANSCATHETER AORTIC VALVE REPLACEMENT, TRANSFEMORAL N/A 12/06/2017   Procedure: TRANSCATHETER AORTIC VALVE REPLACEMENT, TRANSFEMORAL;  Surgeon: Tonny Bollman, MD;  Location: [facility] OR;  Service: Open Heart Surgery;  Laterality: N/A;   TRANSESOPHAGEAL ECHOCARDIOGRAM  12/06/2017     FAMILY HISTORY   Family History  Problem Relation Age of Onset   Lung cancer Mother    Diabetes Maternal  Grandmother    Heart disease Maternal Grandfather    Colon cancer Neg Hx      SOCIAL HISTORY   Social History   Tobacco Use   Smoking status: Former    Current packs/day: 0.00    Average packs/day: 1.8 packs/day for 32.0 years (56.0 ttl pk-yrs)    Types: Cigarettes    Start date: 57    Quit date: 2002    Years since quitting: 22.9   Smokeless tobacco: Never  Vaping Use   Vaping status: Never Used  Substance Use Topics   Alcohol use: No   Drug use: Never     MEDICATIONS   Current Medication:  Current Facility-Administered Medications:    cefUROXime (CEFTIN) tablet 500 mg, 500 mg, Oral, BID WC, Anh Mangano, MD   Chlorhexidine Gluconate Cloth 2 % PADS 6 each, 6 each, Topical, Daily, Vida Rigger, MD, 6 each at 02/13/23 0940   [START ON 02/14/2023] doxycycline (VIBRA-TABS) tablet 100 mg, 100 mg, Oral, Q12H, Chappell, Alex B, RPH   doxycycline (VIBRAMYCIN) 100 mg in dextrose 5 % 250 mL IVPB, 100 mg, Intravenous, Q12H, Tressie Ellis, RPH, Last Rate: 125 mL/hr at  02/13/23 0939, 100 mg at 02/13/23 0939   heparin ADULT infusion 100 units/mL (25000 units/240mL), 1,300 Units/hr, Intravenous, Continuous, Brinson Tozzi, MD, Last Rate: 13 mL/hr at 02/13/23 0942, 1,300 Units/hr at 02/13/23 0942   insulin aspart (novoLOG) injection 0-15 Units, 0-15 Units, Subcutaneous, Q4H, Rust-Chester, Britton L, NP, 3 Units at 02/13/23 1610   norepinephrine (LEVOPHED) 4mg  in (0.016 mg/mL) premix infusion, 0-40 mcg/min, Intravenous, Titrated, Rust-Chester, Britton L, NP, Last Rate: 7.5 mL/hr at 02/13/23 0600, 2 mcg/min at 02/13/23 0600   Oral care mouth rinse, 15 mL, Mouth Rinse, PRN, Karna Christmas, Bassel Gaskill, MD   polyethylene glycol (MIRALAX / GLYCOLAX) packet 17 g, 17 g, Oral, Daily PRN, Ezequiel Essex, NP    ALLERGIES   Castor oil, Sulfonamide derivatives, Amoxicillin, Demerol [meperidine], and Hydrocodone    REVIEW OF SYSTEMS     Unable to obtain ROS due to mechanical  ventilation.   PHYSICAL EXAMINATION   Vital Signs: Temp:  [98.6 F (37 C)-100 F (37.8 C)] 98.6 F (37 C) (12/22 0945) Pulse Rate:  [55-84] 67 (12/22 0945) Resp:  [16-29] 22 (12/22 0945) BP: (76-153)/(33-114) 126/54 (12/22 0945) SpO2:  [92 %-100 %] 99 % (12/22 0945) Arterial Line BP: (100-170)/(43-100) 165/100 (12/21 1700) FiO2 (%):  [28 %] 28 % (12/21 1555)  GENERAL:sedated on MV HEAD: Normocephalic, atraumatic.  EYES: Pupils equal, round, reactive to light.  No scleral icterus.  MOUTH: Moist mucosal membrane. NECK: Supple. No thyromegaly. No nodules. No JVD.  PULMONARY: wheezing and rhonchi CARDIOVASCULAR: S1 and S2. Regular rate and rhythm. No murmurs, rubs, or gallops.  GASTROINTESTINAL: Soft, nontender, non-distended. No masses. Positive bowel sounds. No hepatosplenomegaly.  MUSCULOSKELETAL: No swelling, clubbing, or edema.  NEUROLOGIC: Mild distress due to acute illness SKIN:intact,warm,dry   PERTINENT DATA     Infusions:  doxycycline (VIBRAMYCIN) IV 100 mg (02/13/23 0939)   heparin 1,300 Units/hr (02/13/23 0942)   norepinephrine (LEVOPHED) Adult infusion 2 mcg/min (02/13/23 0600)   Scheduled Medications:  cefUROXime  500 mg Oral BID WC   Chlorhexidine Gluconate Cloth  6 each Topical Daily   [START ON 02/14/2023] doxycycline  100 mg Oral Q12H   insulin aspart  0-15 Units Subcutaneous Q4H   PRN Medications: mouth rinse, polyethylene glycol Hemodynamic parameters:   Intake/Output: 12/21 0701 - 12/22 0700 In: 1510.7 [I.V.:738.9; NG/GT:130; IV Piggyback:641.8] Out: 3100 [Urine:3100]  Ventilator  Settings: Vent Mode: Spontaneous FiO2 (%):  [28 %] 28 % Set Rate:  [20 bmp] 20 bmp Vt Set:  [450 mL] 450 mL PEEP:  [5 cmH20] 5 cmH20 Pressure Support:  [5 cmH20] 5 cmH20   LAB RESULTS:  Basic Metabolic Panel: Recent Labs  Lab 02/11/23 1134 02/11/23 1931 02/12/23 0405 02/13/23 0351  NA 139 134* 135 139  K 2.9* 5.0 4.5 3.6  CL 111 101 103 104  CO2 21*  24 25 25   GLUCOSE 270* 316* 224* 195*  BUN 21 28* 28* 28*  CREATININE 1.11* 1.40* 1.31* 1.53*  CALCIUM 6.1* 7.7* 7.8* 7.7*  MG 1.8 2.5* 2.4 2.3  PHOS  --   --  3.0 3.4   Liver Function Tests: Recent Labs  Lab 02/11/23 1134 02/12/23 0405 02/13/23 0351  AST 33  --   --   ALT 16  --   --   ALKPHOS 65  --   --   BILITOT 0.5  --   --   PROT 5.2*  --   --   ALBUMIN 2.2* 2.5* 2.6*   No results for input(s): "LIPASE", "AMYLASE"  in the last 168 hours. No results for input(s): "AMMONIA" in the last 168 hours. CBC: Recent Labs  Lab 02/11/23 1023 02/12/23 0405 02/13/23 0351  WBC 28.5* 11.1* 12.1*  NEUTROABS 8.2*  --   --   HGB 11.9* 10.0* 9.8*  HCT 41.6 31.9* 31.3*  MCV 97.9 91.1 91.3  PLT 195 166 152   Cardiac Enzymes: No results for input(s): "CKTOTAL", "CKMB", "CKMBINDEX", "TROPONINI" in the last 168 hours. BNP: Invalid input(s): "POCBNP" CBG: Recent Labs  Lab 02/12/23 1550 02/12/23 1932 02/12/23 2318 02/13/23 0430 02/13/23 0812  GLUCAP 175* 151* 143* 175* 159*        ASSESSMENT AND PLAN    -Multidisciplinary rounds held today  Acute Hypoxic Respiratory Failure -liberated from MV -02/12/23 -RVP and COVID/flu/RSV testing  -tracheal aspirate  -BNP-moderate elevation with improvement  -procalcitonin trend -MRSA PCR   Bilateral LE edema with cellulits S/p treatment last admission  -podiatry evaluation  -currently erythematous indurated bilateral swelling suspicious for cellulitis   Crhonic Renal Failure- stage 2 -follow chem 7 -follow UO -continue Foley Catheter-assess need daily   NEUROLOGY - intubated and sedated - minimal sedation to achieve a RASS goal: -1 Wake up assessment pending   Septic shock- present on admission Due to UTI   important suggestion  Newer results are available. Click to view them now.      Component Ref Range & Units (hover) 1 d ago  Specimen Description URINE, CLEAN CATCH Performed at [facility],  [address], [place], [place] [address]  Special Requests NONE Performed at [facility], [address] [place], [place] [address]  Culture  Abnormal  >=100,000 COLONIES/mL ESCHERICHIA COLI SUSCEPTIBILITIES TO FOLLOW Performed at [facility], [address], [place], [place] [address]        ID -continue abx - narrowed to PO now -follow up cultures  GI/Nutrition GI PROPHYLAXIS as indicated DIET-->TF's as tolerated Constipation protocol as indicated  ENDO - ICU hypoglycemic\Hyperglycemia protocol -check FSBS per protocol   ELECTROLYTES -follow labs as needed -replace as needed -pharmacy consultation   DVT/GI PRX ordered -SCDs  TRANSFUSIONS AS NEEDED MONITOR FSBS ASSESS the need for LABS as needed    Vida Rigger, M.D.  Pulmonary & Critical Care Medicine

## 2023-02-13 NOTE — Progress Notes (Signed)
Progress Note  Patient Name: [name] Date of Encounter: 02/13/2023  Primary Cardiologist: New - previously followed by structural heart   Subjective   She was extubated on 12/21, now on supplemental oxygen via nasal cannula. Overnight, she complained of central chest pain that lasted for 10 minutes and spontaneously resolved. No radiation or associated symptoms. Troponin peaked at 1153 on 12/20 and has bene down trending. 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  Currently, chest pain free.   Inpatient Medications    Scheduled Meds:  Chlorhexidine Gluconate Cloth  6 each Topical Daily   insulin aspart  0-15 Units Subcutaneous Q4H   pantoprazole (PROTONIX) IV  40 mg Intravenous Q24H   Continuous Infusions:  ceFEPime (MAXIPIME) IV 200 mL/hr at 02/13/23 0600   doxycycline (VIBRAMYCIN) IV Stopped (02/13/23 0000)   heparin 1,300 Units/hr (02/13/23 0600)   norepinephrine (LEVOPHED) Adult infusion 2 mcg/min (02/13/23 0600)   potassium chloride     PRN Meds: mouth rinse, polyethylene glycol   Vital Signs    Vitals:   02/13/23 0745 02/13/23 0800 02/13/23 0815 02/13/23 0830  BP: (!) 143/60 121/88 129/60 (!) 117/94  Pulse: 82 80 72 75  Resp: (!) 28 20 19  (!) 25  Temp: 99 F (37.2 C) 99 F (37.2 C) 99 F (37.2 C) 98.8 F (37.1 C)  TempSrc:      SpO2: 99% 100% 100% 100%  Weight:      Height:        Intake/Output Summary (Last 24 hours) at 02/13/2023 0917 Last data filed at 02/13/2023 0600 Gross per 24 hour  Intake 1304.34 ml  Output 2950 ml  Net -1645.66 ml   Filed Weights   02/11/23 1200 02/11/23 1700  Weight: 125.4 kg 131.6 kg    Telemetry    SR with PVCs -  Personally Reviewed  ECG     - Personally Reviewed  Physical Exam   GEN: No acute distress.   Neck: JVD difficult to assess secondary to body habitus. Cardiac: RRR, II/VI systolic murmur RUSB, no rubs, or gallops. Anterior chest wall is tender to palpitation with palpation of the anterior chest wall fully reproducing her pain from last evening Respiratory: Diminished breath sounds bilaterally.  GI: Soft, nontender, non-distended.   MS: Bilateral lower extremity erythema with 1+ edema to the mid shins; No deformity. Neuro:  Alert and oriented x 3; Nonfocal.  Psych: Normal affect.  Labs    Chemistry Recent Labs  Lab 02/11/23 1134 02/11/23 1931 02/12/23 0405 02/13/23 0351  NA 139 134* 135 139  K 2.9* 5.0 4.5 3.6  CL 111 101 103 104  CO2 21* 24 25 25   GLUCOSE 270* 316* 224* 195*  BUN 21 28* 28* 28*  CREATININE 1.11* 1.40* 1.31* 1.53*  CALCIUM 6.1* 7.7* 7.8* 7.7*  PROT 5.2*  --   --   --   ALBUMIN 2.2*  --  2.5* 2.6*  AST 33  --   --   --   ALT 16  --   --   --   ALKPHOS 65  --   --   --   BILITOT 0.5  --   --   --   GFRNONAA 50* 38* 41* 34*  ANIONGAP  7 7 7 10      Hematology Recent Labs  Lab 02/11/23 1023 02/12/23 0405 02/13/23 0351  WBC 28.5* 11.1* 12.1*  RBC 4.25 3.50* 3.43*  HGB 11.9* 10.0* 9.8*  HCT 41.6 31.9* 31.3*  MCV 97.9 91.1 91.3  MCH 28.0 28.6 28.6  MCHC 28.6* 31.3 31.3  RDW 15.1 15.1 15.6*  PLT 195 166 152    Cardiac EnzymesNo results for input(s): "TROPONINI" in the last 168 hours. No results for input(s): "TROPIPOC" in the last 168 hours.   BNP Recent Labs  Lab 02/11/23 1023 02/11/23 1424 02/12/23 0405  BNP 1,124.0* 400.3* 623.5*     DDimer No results for input(s): "DDIMER" in the last 168 hours.   Radiology    DG Chest Port 1 View Result Date: 02/11/2023 IMPRESSION: 1. Left IJ central venous catheter tip over the SVC confluence. No pneumothorax. 2. Cardiomegaly with vascular congestion. Patchy left greater than right basilar  airspace disease, atelectasis versus pneumonia. This appears worse compared to prior Electronically Signed   By: Jasmine Pang M.D.   On: 02/11/2023 21:00   CT Angio Chest PE W/Cm &/Or Wo Cm Result Date: 02/11/2023 IMPRESSION: 1. No large central pulmonary embolus. No evidence for lobar or segmental pulmonary embolus. Subsegmental pulmonary arteries to both lower lobes are not reliably evaluated due to bolus timing and motion artifact. 2. Interlobular septal thickening in the upper lobes bilaterally with bilateral asymmetric central predominant ground-glass opacity. Imaging features are compatible with pulmonary edema. 3. Collapse/consolidative disease in the lower lungs bilaterally is associated with small bilateral pleural effusions. Lower lobe disease compatible with atelectasis and/or pneumonia. 4. Bilateral pulmonary nodules noted, unchanged since remote prior where visible although portions of both lungs have been obscured by airspace disease. Consider follow-up CT chest without contrast after resolution of acute symptoms for more definitive evaluation of pulmonary nodules. 5. Cirrhosis. 6.  Aortic Atherosclerosis (ICD10-I70.0). Electronically Signed   By: Kennith Center M.D.   On: 02/11/2023 15:13   CT Head Wo Contrast Result Date: 02/11/2023 IMPRESSION: No evidence of acute intracranial abnormality. An MRI could provide more sensitive evaluation for hypoxic/ischemic injury if clinically warranted Electronically Signed   By: Feliberto Harts M.D.   On: 02/11/2023 14:12   DG Abdomen 1 View Result Date: 02/11/2023 IMPRESSION: 1. Enteric catheter tip projecting over the gastric fundus. Electronically Signed   By: Sharlet Salina M.D.   On: 02/11/2023 11:25   DG Chest Portable 1 View Result Date: 02/11/2023 IMPRESSION: 1. Support devices as above. 2. Pulmonary vascular congestion without overt edema. Electronically Signed   By: Sharlet Salina M.D.   On: 02/11/2023 11:23    Cardiac Studies    [facility] 11/02/2017: Mid RCA lesion is 10% stenosed. Prox LAD to Mid LAD lesion is 10% stenosed.   1. Mild non-obstructive CAD 2. Severe aortic stenosis (peak to peak gradient 62 mmHg, mean gradient 51.6 mmHg, AVA 0.82 cm2)   Recommendations: Will continue workup for TAVR.  __________   2D echo 01/28/2021: 1. Left ventricular ejection fraction, by estimation, is 55 to 60%. The  left ventricle has normal function. The apical inferoseptal wall and apex  appear hypokinetic (although views are slightly off axis). There is  moderate hypertrophy of the basal  septum. The rest of the LV segments demonstrate mild-to-moderate  concentric left ventricular hypertrophy. Left ventricular diastolic  parameters are consistent with Grade II diastolic dysfunction  (pseudonormalization). Elevated left atrial pressure.   2. Right ventricular systolic function is normal. The right  ventricular  size is normal. There is mildly elevated pulmonary artery systolic  pressure. The estimated right ventricular systolic pressure is 39.2 mmHg.   3. Left atrial size was mildly dilated.   4. The mitral valve is abnormal. Trivial mitral valve regurgitation.  There is severe mitral annular calcification with thickening and  calcification of the mitral valve leaflets. No significant mitral  stenosis. MVA by VTI 2.68cm2, mean gradient at  HR 83bpm.   5. The aortic valve has been repaired/replaced. There is a 26 mm  CoreValve-Evolut Pro prosthetic (TAVR) valve present in the aortic  position. Procedure Date: 12/06/17. Echo findings are consistent with  normal structure and function of the aortic valve  prosthesis. The mean gradient is , Vmax 1.8 m/s, DI 0.84. There is  mild paravalvular leak best seen on apical 5C view.   Comparison(s): TTE on 12/06/18 the EF was 60-65%. AV mean,  peak. The wall motion seen on current study was not present on prior,  however, views on current study are  slightly off-axis.  __________   2D echo 10/20/2022 [facility]): Summary   1. The left ventricle is normal in size with mildly increased wall  thickness.   2. The left ventricular systolic function is mildly decreased, LVEF is  visually estimated at 45%.    3. The apex and the mid-anterior segment appear hypokinetic.    4. The right ventricle is normal in size, with normal systolic function.    5. Aortic valve replacement (26 mm Medtronic evolute TAVR, implantation  date: 12-06-2017).    6. Aortic valve Doppler indices are consistent with normal prosthetic valve  function.   7. There is mild tricuspid regurgitation.    8. The IVC is not well visualized precluding the ability to accurate assess  right atrial pressure.    9. Technically difficult study.    10. An ultrasound enhancing agent was used to improve the visualization of the left ventricular cavity and endocardial borders.  __________  2D echo 02/12/2023: 1. Left ventricular ejection fraction, by estimation, is 30 to 35%. The  left ventricle has moderately decreased function. The left ventricle  demonstrates regional wall motion abnormalities (see scoring  diagram/findings for description) and global  hypokinesis. There is mild concentric left ventricular hypertrophy. Left  ventricular diastolic parameters are consistent with Grade II diastolic  dysfunction (pseudonormalization). Elevated left ventricular end-diastolic  pressure. There is akinesis of the   left ventricular, apical septal wall.   2. Right ventricular systolic function is normal. The right ventricular  size is normal.   3. Left atrial size was severely dilated.   4. The mitral valve is degenerative. No evidence of mitral valve  regurgitation. Mild mitral stenosis. The mean mitral valve gradient is 4.0  mmHg. Severe mitral annular calcification.   5. The aortic valve has been repaired/replaced. Perivalvular Aortic valve  regurgitation is mild. 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  There is a 26 mm CoreValve-Evolut Pro prosthetic (TAVR) valve present in  the aortic position. Aortic  regurgitation PHT measures 557 msec. Aortic valve area, by VTI measures  0.93 cm. Aortic valve mean gradient measures 15.0 mmHg. Aortic valve Vmax  measures 2.70 m/s. DVI 0.37.   6. The inferior vena cava is normal in size with greater than 50%  respiratory variability, suggesting right atrial pressure of 3 mmHg.   7. Compared to echo dated 01/28/2021, LVF has declined but apical  inferoseptal wall motion abnormality is unchanged. The mean  TAVR gradient  has increased from to and DVI has decreased from 0.92 to  0.37.   Patient Profile     82 y.o. female with history of nonobstructive CAD by LHC in 2019, severe aortic stenosis status post TAVR in 11/2017, HFpEF, fibromyalgia, DM2 HTN, HLD, and obesity  who is being seen today for the evaluation of elevated troponin at the request of Zada Girt, NP.   Assessment & Plan    Respiratory arrest with presumed septic shock and elevated high-sensitivity troponin:  -Suspected to be a primary respiratory event, extubated 12/21 -Elevated high-sensitivity troponin peaking at 1153, cannot exclude NSTEMI, though favor supply/demand ischemia in the setting of her arrest  -Agree with heparin drip for 48 hours  -Echo with progressive cardiomyopathy with an EF of 30-35% (prior 45% at [facility] in 09/2022)  -Pre-TAVR cath in 2019 showed minimal nonobstructive disease estimated at 10% involving the proximal to mid LAD and RCA  -Episode of chest pain overnight into 12/22 lasting 10 minutes and spontaneously resolving, chest pain free since -Chest pain is fully reproducible to palpation on exam this morning, however, with troponin elevation, and echo findings (progressive cardiomyopathy and akinesis of the LV apical septal wall, would pursue R/LHC prior to discharge -Renal function mildly increased, hold further IV  Lasix today (received dose this morning) -Ongoing management per critical care medicine   History of severe aortic stenosis:  -Status post TAVR in 2019  -Echo this admission with mild aortic prosthesis regurgitation with mild to moderate stenosis -Outpatient follow up  Electrolyte derangements:  -Improving  -Replete per critical care medicine protocol   CKD stage II:  -Renal function mildly elevated today -Hold IV Lasix -Cannot exclude ATN from acute illness and respiratory arrest   Normocytic anemia:  -Stable -Monitor on heparin drip        For questions or updates, please contact [facility] Please consult [email or web address] for contact info under Cardiology/STEMI.    Signed, Eula Listen, PA-C [facility] Pager: [phone] 02/13/2023, 9:17 AM

## 2023-02-13 NOTE — Evaluation (Addendum)
Physical Therapy Evaluation Patient Details Name: [name] MRN: [id number] DOB: 1941/02/13 Today's Date: 02/13/2023  History of Present Illness  Pt is an 82 y/o F admitted on 02/11/23. Pt was d/c earlier in Dec. 2024 after being treated for BLE cellulitis & L toe infected ulcer with underlying fx of phalanx & osteomyelitis. Pt was on the way to the hospital for an appointment & found to be hypoxic. Pt required intubation for severe hypoxemia & developed circulatory shock. Pt was extubated 02/12/23. 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 Pt reports prior to admission she was mod I with rollator, living with son, but this contradicts information most recently noted in chart; will clarify with family when able. On this date, pt is oriented to self, month, & location but not situation or year, follows simple commands with extra time. Pt with erythema noted to distal BLE. Pt is able to complete supine>sit with min assist, HOB elevated, bed rails with extra time & encouragement, STS with min assist & step pivot with min assist with RW. Pt reports fear of falling throughout session with PT reassuring her throughout. Pt would benefit from ongoing acute PT services to address strengthening, balance, endurance to increase independence & reduce fall risk with mobility.      If plan is discharge home, recommend the following: A lot of help with walking and/or transfers;A lot of help with bathing/dressing/bathroom;Assistance with cooking/housework;Assist for transportation;Help with stairs or ramp for entrance   Can travel by private vehicle   No    Equipment Recommendations Other (comment) (defer to next venue of care)  Recommendations for Other Services       Functional  Status Assessment Patient has had a recent decline in their functional status and demonstrates the ability to make significant improvements in function in a reasonable and predictable amount of time.     Precautions / Restrictions Precautions Precautions: Fall Restrictions Weight Bearing Restrictions Per Provider Order: No      Mobility  Bed Mobility Overal bed mobility: Needs Assistance Bed Mobility: Supine to Sit     Supine to sit: Min assist, HOB elevated, Used rails     General bed mobility comments: extra time to complete, exit R side of bed    Transfers Overall transfer level: Needs assistance Equipment used: Rolling walker (2 wheels) Transfers: Sit to/from Stand, Bed to chair/wheelchair/BSC Sit to Stand: Min assist   Step pivot transfers: Min assist       General transfer comment: Pt transfers STS from elevated EOB with RW & min assist, step pivot bed>recliner with min assist, RW & some assistance to maneuver RW    Ambulation/Gait                  Stairs            Wheelchair Mobility     Tilt Bed    Modified Rankin (Stroke Patients Only)       Balance Overall balance assessment: Needs assistance Sitting-balance support: Feet supported, Feet unsupported, Bilateral upper extremity supported Sitting balance-Leahy Scale: Fair     Standing balance support: During functional activity, Bilateral upper extremity supported, Reliant on assistive device for balance Standing balance-Leahy Scale: Poor  Pertinent Vitals/Pain Pain Assessment Pain Assessment: Faces Faces Pain Scale: Hurts even more Pain Location: back Pain Descriptors / Indicators: Discomfort Pain Intervention(s): Monitored during session, Repositioned    Home Living Family/patient expects to be discharged to:: Private residence Living Arrangements: Spouse/significant other (reports she lives with son) Available Help at Discharge:  Family Type of Home: House Home Access: Stairs to enter Entrance Stairs-Rails: Right Entrance Stairs-Number of Steps: 2-3   Home Layout: Two level;Able to live on main level with bedroom/bathroom Home Equipment: Cane - single point;Rolling Walker (2 wheels);Rollator (4 wheels)      Prior Function               Mobility Comments: ambulatory with rollator x several years, denies falls, no longer drives ADLs Comments: independent with bathing & dressing, light cooking & cleaning but primarily eats out     Extremity/Trunk Assessment   Upper Extremity Assessment Upper Extremity Assessment: Generalized weakness    Lower Extremity Assessment Lower Extremity Assessment: Generalized weakness (BLE erythema distally, BLE great toe amputations)       Communication   Communication Communication: Hearing impairment  Cognition Arousal: Alert Behavior During Therapy: WFL for tasks assessed/performed Overall Cognitive Status: Impaired/Different from baseline Area of Impairment: Orientation, Attention, Memory, Following commands, Safety/judgement, Awareness                 Orientation Level: Disoriented to, Time, Situation (oriented to month but not year)   Memory: Decreased short-term memory Following Commands: Follows one step commands consistently, Follows one step commands with increased time Safety/Judgement: Decreased awareness of safety, Decreased awareness of deficits Awareness: Emergent   General Comments: fearful of falling        General Comments General comments (skin integrity, edema, etc.): Pt on 3L/min via nasal cannula throughout session. C/o feeling "funny" once sitting in recliner, BP in R wrist 135/123 mmHg MAP 129, rechecked a few minutes later 122/76 mmHg.    Exercises     Assessment/Plan    PT Assessment Patient needs continued PT services  PT Problem List Decreased strength;Cardiopulmonary status limiting activity;Decreased activity  tolerance;Decreased balance;Decreased mobility;Decreased safety awareness;Decreased knowledge of precautions;Decreased knowledge of use of DME;Decreased cognition;Decreased skin integrity;Pain       PT Treatment Interventions DME instruction;Balance training;Gait training;Neuromuscular re-education;Stair training;Patient/family education;Functional mobility training;Therapeutic activities;Therapeutic exercise;Modalities    PT Goals (Current goals can be found in the Care Plan section)  Acute Rehab PT Goals Patient Stated Goal: get better PT Goal Formulation: With patient Time For Goal Achievement: 02/27/23 Potential to Achieve Goals: Good    Frequency Min 1X/week     Co-evaluation               AM-PAC PT "6 Clicks" Mobility  Outcome Measure Help needed turning from your back to your side while in a flat bed without using bedrails?: A Little Help needed moving from lying on your back to sitting on the side of a flat bed without using bedrails?: A Lot Help needed moving to and from a bed to a chair (including a wheelchair)?: A Lot Help needed standing up from a chair using your arms (e.g., wheelchair or bedside chair)?: A Lot Help needed to walk in hospital room?: A Lot Help needed climbing 3-5 steps with a railing? : Total 6 Click Score: 12    End of Session Equipment Utilized During Treatment: Oxygen Activity Tolerance: Patient tolerated treatment well;Patient limited by fatigue Patient left: in chair;with call bell/phone within reach Nurse Communication: Mobility status (BP) PT  Visit Diagnosis: Muscle weakness (generalized) (M62.81);Difficulty in walking, not elsewhere classified (R26.2);Unsteadiness on feet (R26.81)    Time: 1334-1400 PT Time Calculation (min) (ACUTE ONLY): 26 min   Charges:   PT Evaluation $PT Eval Moderate Complexity: 1 Mod   PT General Charges $$ ACUTE PT VISIT: 1 Visit         Aleda Grana, PT, DPT 02/13/23, 2:19 PM   Sandi Mariscal 02/13/2023, 2:16 PM

## 2023-02-13 NOTE — Consult Note (Signed)
PHARMACY CONSULT NOTE - ELECTROLYTES  Pharmacy Consult for Electrolyte Monitoring and Replacement   Recent Labs: Potassium (mmol/L)  Date Value  02/13/2023 3.6   Magnesium (mg/dL)  Date Value  09/32/3557 2.3   Calcium (mg/dL)  Date Value  32/20/2542 7.7 (L)   Albumin (g/dL)  Date Value  70/62/3762 2.6 (L)   Phosphorus (mg/dL)  Date Value  83/15/1761 3.4   Sodium (mmol/L)  Date Value  02/13/2023 139  10/26/2017 142   Height: 5' 2.01" (157.5 cm) Weight: 131.6 kg (290 lb 2 oz) IBW/kg (Calculated) : 50.12 Estimated Creatinine Clearance: 38.3 mL/min (A) (by C-G formula based on SCr of 1.53 mg/dL (H)).  Assessment  [name] is a 82 y.o. female presenting with respiratory distress / PEA arrest / septic shock requiring vasopressors. PMH significant for hypertension, diabetes, CHF, aortic stenosis status post TAVR, and nonalcoholic fatty liver disease . Pharmacy has been consulted to monitor and replace electrolytes.  Patient is intubated, sedated, on mechanical ventilation  Diet: NPO MIVF: N/A, holding off on fluids given suspected CHF Pertinent medications: Lasix 40 mg IV BID  Goal of Therapy:  K >= 4, Mg >= 2  Plan:  --Give Kcl 40 mEq PO x 1 today if patient able to take PO after extubation --Electrolytes tomorrow AM  Thank you for allowing pharmacy to be a part of this patient's care.  Tressie Ellis 02/13/2023 7:51 AM

## 2023-02-13 NOTE — Evaluation (Signed)
Clinical/Bedside Swallow Evaluation Patient Details  Name: [name] MRN: [id number] Date of Birth: Aug 09, 1940  Today's Date: 02/13/2023 Time: SLP Start Time (ACUTE ONLY): 1010 SLP Stop Time (ACUTE ONLY): 1025 SLP Time Calculation (min) (ACUTE ONLY): 15 min  Past Medical History:  Past Medical History:  Diagnosis Date   Abnormal thyroid biopsy 2018   results were negative.   Anemia    "when I was alot younger" (12/06/2017)   Anxiety    self reported   Arthritis    "almost all over; used to cry w/it when I was in my teens" (12/06/2017)   CHF (congestive heart failure) (HCC)    Chronic back pain    "all over" (12/06/2017)   Depression    "lost my son last year to cancer; I tended to him; he lived w/me" (12/06/2017)   Diverticulosis of colon (without mention of hemorrhage)    External hemorrhoids without mention of complication    Fatty liver disease, nonalcoholic    Fibromyalgia    History of kidney stones    History of stomach ulcers 1970   Hyperlipemia    Hypertension    IBS (irritable bowel syndrome)    Morbid obesity (HCC)    Psoriasis    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   26 mm Medtronic Evolut Pro transcatheter heart valve placed via percutaneous right transfemoral approach    Severe aortic stenosis    Sinus headache    Sleep apnea    "was told I do; never have had any problems w/it" (12/06/2017)   Type II diabetes mellitus (HCC)    Past Surgical History:  Past Surgical History:  Procedure Laterality Date   ABDOMINAL ADHESION SURGERY     "took 2 out"   ABDOMINAL HYSTERECTOMY     AMPUTATION Left 01/23/2022   Procedure: AMPUTATION DIGIT;  Surgeon: Candelaria Stagers, DPM;  Location: [facility];  Service: Podiatry;  Laterality: Left;   AMPUTATION TOE Right 02/12/2020   Procedure: Right hallux and second toe amputation;  Surgeon: Toni Arthurs, MD;  Location: [facility];  Service: Orthopedics;  Laterality: Right;    APPENDECTOMY     BACK SURGERY     BIOPSY  08/05/2020   Procedure: BIOPSY;  Surgeon: Charlott Rakes, MD;  Location: [facility];  Service: Endoscopy;;   BIOPSY THYROID  2018   results were negative.   BREAST LUMPECTOMY Left X 1   "benign"   CATARACT EXTRACTION W/ INTRAOCULAR LENS  IMPLANT, BILATERAL Bilateral    CATARACT EXTRACTION W/ INTRAOCULAR LENS IMPLANT Bilateral 2017   COLONOSCOPY WITH PROPOFOL N/A 08/05/2020   Procedure: COLONOSCOPY WITH PROPOFOL;  Surgeon: Charlott Rakes, MD;  Location: [facility];  Service: Endoscopy;  Laterality: N/A;   ESOPHAGOGASTRODUODENOSCOPY (EGD) WITH PROPOFOL N/A 08/05/2020   Procedure: ESOPHAGOGASTRODUODENOSCOPY (EGD) WITH PROPOFOL;  Surgeon: Charlott Rakes, MD;  Location: [facility];  Service: Endoscopy;  Laterality: N/A;   EYE SURGERY     HEMORRHOID BANDING     LAPAROSCOPIC CHOLECYSTECTOMY     LEFT AND RIGHT HEART CATHETERIZATION WITH CORONARY ANGIOGRAM N/A 11/15/2013   Procedure: LEFT AND RIGHT HEART CATHETERIZATION WITH CORONARY ANGIOGRAM;  Surgeon: Micheline Chapman, MD;  Location: [facility];  Service: Cardiovascular;  Laterality: N/A;   LUMBAR DISC SURGERY     POLYPECTOMY  08/05/2020   Procedure: POLYPECTOMY;  Surgeon: Charlott Rakes, MD;  Location: [facility];  Service: Endoscopy;;   RETINAL DETACHMENT SURGERY Right    RIGHT/LEFT HEART CATH AND CORONARY ANGIOGRAPHY N/A  11/02/2017   Procedure: RIGHT/LEFT HEART CATH AND CORONARY ANGIOGRAPHY;  Surgeon: Kathleene Hazel, MD;  Location: [facility];  Service: Cardiovascular;  Laterality: N/A;   TEE WITHOUT CARDIOVERSION N/A 12/06/2017   Procedure: TRANSESOPHAGEAL ECHOCARDIOGRAM (TEE);  Surgeon: Tonny Bollman, MD;  Location: [facility] OR;  Service: Open Heart Surgery;  Laterality: N/A;   TONSILLECTOMY     TRANSCATHETER AORTIC VALVE REPLACEMENT, TRANSFEMORAL  12/06/2017   TRANSCATHETER AORTIC VALVE REPLACEMENT, TRANSFEMORAL N/A 12/06/2017   Procedure: TRANSCATHETER AORTIC VALVE  REPLACEMENT, TRANSFEMORAL;  Surgeon: Tonny Bollman, MD;  Location: [facility] OR;  Service: Open Heart Surgery;  Laterality: N/A;   TRANSESOPHAGEAL ECHOCARDIOGRAM  12/06/2017   HPI:  Per H&P "82 year old female with a history of Essential hypertension, heart failure with preserved LVEF, Severe AS s/p TAVR, hypertension, diabetes type 2, diabetic ulcers, fatty liver disease, GERD, chronic dysphagia, history of colon polyps, was admitted and discharged earlier in dec 2024 with b/l LE cellulits and left toe infected ulcer with underlying fracture of phalanx and osteomyelitis. She apparently was on the way to hospital for routine appt and found to be hypoxemic in field.  She required intubation for severe hypoxemia in ED."    Assessment / Plan / Recommendation  Clinical Impression  Pt seen for clinical swallowing evaluation. Pt alert, pleasant, and cooperative. Confusion evident. On 2L/min O2 via [place]. Oral motor exam unremarkable x missing back molars. Pt with s/sx mild oral and possible pharyngeosophageal dysphagia. Pt with mildly prolonged mastication of solids as well as delayed dry cough x1 - pt stating, "a little crumb got me." Noted pt with esophageal dysphagia and GERD. Recommed a mech soft diet with thin liquids with extra gravies/sauces/condiments to moisten solids. SLP to f/u x1 for diet tolerance.  SLP Visit Diagnosis: Dysphagia, unspecified (R13.10)    Aspiration Risk  Mild aspiration risk    Diet Recommendation Dysphagia 3 (Mech soft);Thin liquid    Liquid Administration via: Spoon;Cup;Straw Medication Administration: Crushed with puree Supervision: Staff to assist with self feeding;Full supervision/cueing for compensatory strategies Compensations: Minimize environmental distractions;Slow rate;Small sips/bites;Follow solids with liquid Postural Changes: Seated upright at 90 degrees (upright 60-90 minutes after POs)    Other  Recommendations Oral Care Recommendations: Oral care  QID;Staff/trained caregiver to provide oral care    Recommendations for follow up therapy are one component of a multi-disciplinary discharge planning process, led by the attending physician.  Recommendations may be updated based on patient status, additional functional criteria and insurance authorization.  Follow up Recommendations  (TBD)      Assistance Recommended at Discharge    Functional Status Assessment Patient has had a recent decline in their functional status and demonstrates the ability to make significant improvements in function in a reasonable and predictable amount of time.  Frequency and Duration min 2x/week  1 week       Prognosis Prognosis for improved oropharyngeal function: Good      Swallow Study   General Date of Onset: 02/11/23 HPI: Per H&P "82 year old female with a history of Essential hypertension, heart failure with preserved LVEF, Severe AS s/p TAVR, hypertension, diabetes type 2, diabetic ulcers, fatty liver disease, GERD, chronic dysphagia, history of colon polyps, was admitted and discharged earlier in dec 2024 with b/l LE cellulits and left toe infected ulcer with underlying fracture of phalanx and osteomyelitis. She apparently was on the way to hospital for routine appt and found to be hypoxemic in field.  She required intubation for severe hypoxemia in ED." Type of Study:  Bedside Swallow Evaluation Previous Swallow Assessment: none Diet Prior to this Study: NPO Temperature Spikes Noted: Yes Respiratory Status: Nasal cannula History of Recent Intubation: Yes Total duration of intubation (days): 2 days Date extubated: 02/12/23 Behavior/Cognition: Alert;Cooperative;Pleasant mood;Confused Oral Cavity Assessment: Within Functional Limits Oral Care Completed by SLP: Yes Oral Cavity - Dentition: Missing dentition Vision: Functional for self-feeding Self-Feeding Abilities: Needs assist;Needs set up Patient Positioning: Upright in bed Baseline Vocal  Quality: Normal Volitional Cough: Strong Volitional Swallow: Able to elicit    Oral/Motor/Sensory Function Overall Oral Motor/Sensory Function: Within functional limits   Ice Chips Ice chips: Within functional limits Presentation: Spoon   Thin Liquid Thin Liquid: Within functional limits Presentation: Straw    Nectar Thick Nectar Thick Liquid: Not tested   Honey Thick Honey Thick Liquid: Not tested   Puree Puree: Within functional limits Presentation: Spoon   Solid     Solid: Impaired Presentation: Self Fed Pharyngeal Phase Impairments: Cough - Delayed Other Comments: mildly prolonged mastication; delayed dry cough x1 - pt stating, "a little crumb got me"     Clyde Canterbury, M.S., CCC-SLP Speech-Language Pathologist [facility] [phone] (ASCOM)  Alessandra Bevels Renella Steig 02/13/2023,11:41 AM

## 2023-02-13 NOTE — Progress Notes (Signed)
Lanora Manis, NP notified of patients c/o chest pain/soreness 10/10, patient currently on heparin gtt and no prn pain medications ordered. New order entered by NP for morphine IV 2 mg once. Continuing to monitor.

## 2023-02-13 NOTE — Progress Notes (Signed)
Per Dr. Darnelle Maffucci keep patients MAP greater then 60. Continue to assess.

## 2023-02-13 NOTE — Plan of Care (Signed)
  Problem: Education: Goal: Knowledge of General Education information will improve Description: Including pain rating scale, medication(s)/side effects and non-pharmacologic comfort measures Outcome: Progressing   Problem: Clinical Measurements: Goal: Ability to maintain clinical measurements within normal limits will improve Outcome: Not Progressing Goal: Will remain free from infection Outcome: Not Progressing Goal: Diagnostic test results will improve Outcome: Not Progressing Goal: Cardiovascular complication will be avoided Outcome: Not Progressing   Problem: Activity: Goal: Risk for activity intolerance will decrease Outcome: Not Progressing   Problem: Nutrition: Goal: Adequate nutrition will be maintained Outcome: Not Progressing   Problem: Coping: Goal: Level of anxiety will decrease Outcome: Progressing   Problem: Elimination: Goal: Will not experience complications related to bowel motility Outcome: Progressing Goal: Will not experience complications related to urinary retention Outcome: Progressing   Problem: Pain Management: Goal: General experience of comfort will improve Outcome: Progressing   Problem: Safety: Goal: Ability to remain free from injury will improve Outcome: Progressing

## 2023-02-13 NOTE — Progress Notes (Signed)
PHARMACY - ANTICOAGULATION CONSULT NOTE  Pharmacy Consult for Heparin Infusion Indication: chest pain/ACS  Allergies  Allergen Reactions   Castor Oil     "passed out" ? SYNCOPE ?   Sulfonamide Derivatives     Other Reaction(s): Not available   Amoxicillin Diarrhea and Other (See Comments)    Has patient had a PCN reaction causing immediate rash, facial/tongue/throat swelling, SOB or lightheadedness with hypotension: No Has patient had a PCN reaction causing severe rash involving mucus membranes or skin necrosis: No Has patient had a PCN reaction that required hospitalization: No Has patient had a PCN reaction occurring within the last 10 years: No If all of the above answers are "NO", then may proceed with Cephalosporin use.    Demerol [Meperidine] Nausea And Vomiting   Hydrocodone Nausea And Vomiting    Patient Measurements: Height: 5' 2.01" (157.5 cm) Weight: 131.6 kg (290 lb 2 oz) IBW/kg (Calculated) : 50.12 Heparin Dosing Weight: 83.3 kg  Vital Signs: Temp: 99.1 F (37.3 C) (12/22 0615) Temp Source: Bladder (12/22 0400) BP: 134/41 (12/22 0615) Pulse Rate: 67 (12/22 0615)  Labs: Recent Labs    02/11/23 1023 02/11/23 1023 02/11/23 1134 02/11/23 1931 02/11/23 2219 02/12/23 0224 02/12/23 0405 02/12/23 1313 02/12/23 1706 02/12/23 1848 02/12/23 2152 02/13/23 0351 02/13/23 0542  HGB 11.9*  --   --   --   --   --  10.0*  --   --   --   --  9.8*  --   HCT 41.6  --   --   --   --   --  31.9*  --   --   --   --  31.3*  --   PLT 195  --   --   --   --   --  166  --   --   --   --  152  --   APTT  --   --   --  57*  --   --   --   --   --   --   --   --   --   LABPROT  --   --   --  15.3*  --   --   --   --   --   --   --   --   --   INR  --   --   --  1.2  --   --   --   --   --   --   --   --   --   HEPARINUNFRC  --    < >  --   --   --   --  0.14* 0.23*  --   --  0.33  --  0.31  CREATININE  --   --    < > 1.40*  --   --  1.31*  --   --   --   --  1.53*  --    TROPONINIHS 45*  --    < > 1,098*   < > 1,028*  --   --  508* 515*  --   --   --    < > = values in this interval not displayed.    Estimated Creatinine Clearance: 38.3 mL/min (A) (by C-G formula based on SCr of 1.53 mg/dL (H)).   Medical History: Past Medical History:  Diagnosis Date   Abnormal thyroid biopsy 2018   results were negative.  Anemia    "when I was alot younger" (12/06/2017)   Anxiety    self reported   Arthritis    "almost all over; used to cry w/it when I was in my teens" (12/06/2017)   CHF (congestive heart failure) (HCC)    Chronic back pain    "all over" (12/06/2017)   Depression    "lost my son last year to cancer; I tended to him; he lived w/me" (12/06/2017)   Diverticulosis of colon (without mention of hemorrhage)    External hemorrhoids without mention of complication    Fatty liver disease, nonalcoholic    Fibromyalgia    History of kidney stones    History of stomach ulcers 1970   Hyperlipemia    Hypertension    IBS (irritable bowel syndrome)    Morbid obesity (HCC)    Psoriasis    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 She was found to have an elevated troponin level of 1,035. Pharmacy was consulted to initiate patient on heparin infusion for ACS/STEMI. Patient was not on anticoagulation prior to arrival.   Baseline INR and aPTT ordered.  No signs/symptoms of bleeding noted in chart. Hgb 11.9. PLT 195.  Goal  of Therapy:  Heparin level 0.3-0.7 units/ml Monitor platelets by anticoagulation protocol: Yes  Heparin  12/21@0405  HL = 1.4 SUBtherapeutic  12/21@1313  HL = 0.23 SUBtherapeutic  12/21@2152  HL = 0.33 Therapeutic X 1   12/22@0542  HL = 0.31 Therapeutic X 2             Plan:  12/22:  HL @ 0542 = 0.31, therapeutic X 2 - Will continue pt on current rate and recheck HL on 12/23 with AM labs.  Daily CBC  Kasheem Toner D, PharmD 02/13/2023 6:25 AM

## 2023-02-14 DIAGNOSIS — I469 Cardiac arrest, cause unspecified: Secondary | ICD-10-CM | POA: Diagnosis not present

## 2023-02-14 DIAGNOSIS — R579 Shock, unspecified: Secondary | ICD-10-CM | POA: Diagnosis not present

## 2023-02-14 DIAGNOSIS — I214 Non-ST elevation (NSTEMI) myocardial infarction: Secondary | ICD-10-CM | POA: Diagnosis not present

## 2023-02-14 DIAGNOSIS — R7989 Other specified abnormal findings of blood chemistry: Secondary | ICD-10-CM | POA: Diagnosis not present

## 2023-02-14 LAB — RENAL FUNCTION PANEL
Albumin: 2.5 g/dL — ABNORMAL LOW (ref 3.5–5.0)
Anion gap: 8 (ref 5–15)
BUN: 23 mg/dL (ref 8–23)
CO2: 27 mmol/L (ref 22–32)
Calcium: 7.8 mg/dL — ABNORMAL LOW (ref 8.9–10.3)
Chloride: 105 mmol/L (ref 98–111)
Creatinine, Ser: 1.37 mg/dL — ABNORMAL HIGH (ref 0.44–1.00)
GFR, Estimated: 39 mL/min — ABNORMAL LOW (ref >60)
Glucose, Bld: 142 mg/dL — ABNORMAL HIGH (ref 70–99)
Phosphorus: 3 mg/dL (ref 2.5–4.6)
Potassium: 3.9 mmol/L (ref 3.5–5.1)
Sodium: 140 mmol/L (ref 135–145)

## 2023-02-14 LAB — GLUCOSE, CAPILLARY
Glucose-Capillary: 125 mg/dL — ABNORMAL HIGH (ref 70–99)
Glucose-Capillary: 141 mg/dL — ABNORMAL HIGH (ref 70–99)
Glucose-Capillary: 142 mg/dL — ABNORMAL HIGH (ref 70–99)
Glucose-Capillary: 149 mg/dL — ABNORMAL HIGH (ref 70–99)
Glucose-Capillary: 158 mg/dL — ABNORMAL HIGH (ref 70–99)
Glucose-Capillary: 179 mg/dL — ABNORMAL HIGH (ref 70–99)
Glucose-Capillary: 191 mg/dL — ABNORMAL HIGH (ref 70–99)

## 2023-02-14 LAB — CBC
HCT: 29.9 % — ABNORMAL LOW (ref 36.0–46.0)
Hemoglobin: 9.2 g/dL — ABNORMAL LOW (ref 12.0–15.0)
MCH: 28.5 pg (ref 26.0–34.0)
MCHC: 30.8 g/dL (ref 30.0–36.0)
MCV: 92.6 fL (ref 80.0–100.0)
Platelets: 126 10*3/uL — ABNORMAL LOW (ref 150–400)
RBC: 3.23 MIL/uL — ABNORMAL LOW (ref 3.87–5.11)
RDW: 15.4 % (ref 11.5–15.5)
WBC: 8.2 10*3/uL (ref 4.0–10.5)
nRBC: 0 % (ref 0.0–0.2)

## 2023-02-14 MED ORDER — MUPIROCIN 2 % EX OINT
TOPICAL_OINTMENT | Freq: Two times a day (BID) | CUTANEOUS | Status: DC
  Administered 2023-02-14: 1 via TOPICAL
  Filled 2023-02-14 (×2): qty 22

## 2023-02-14 MED ORDER — IPRATROPIUM-ALBUTEROL 0.5-2.5 (3) MG/3ML IN SOLN
3.0000 mL | Freq: Four times a day (QID) | RESPIRATORY_TRACT | Status: DC | PRN
  Administered 2023-02-14 – 2023-02-17 (×2): 3 mL via RESPIRATORY_TRACT
  Filled 2023-02-14: qty 3

## 2023-02-14 MED ORDER — ALPRAZOLAM 0.25 MG PO TABS
0.2500 mg | ORAL_TABLET | Freq: Two times a day (BID) | ORAL | Status: DC | PRN
  Administered 2023-02-14 – 2023-02-22 (×4): 0.25 mg via ORAL
  Filled 2023-02-14 (×4): qty 1

## 2023-02-14 MED ORDER — MORPHINE SULFATE (PF) 2 MG/ML IV SOLN
2.0000 mg | INTRAVENOUS | Status: DC | PRN
  Administered 2023-02-14 – 2023-02-19 (×6): 2 mg via INTRAVENOUS
  Filled 2023-02-14 (×6): qty 1

## 2023-02-14 MED ORDER — POTASSIUM CHLORIDE CRYS ER 20 MEQ PO TBCR
20.0000 meq | EXTENDED_RELEASE_TABLET | Freq: Once | ORAL | Status: AC
  Administered 2023-02-14: 20 meq via ORAL
  Filled 2023-02-14: qty 1

## 2023-02-14 MED ORDER — FENTANYL CITRATE PF 50 MCG/ML IJ SOSY
25.0000 ug | PREFILLED_SYRINGE | INTRAMUSCULAR | Status: DC | PRN
  Administered 2023-02-14: 25 ug via INTRAVENOUS
  Filled 2023-02-14: qty 1

## 2023-02-14 MED ORDER — ONDANSETRON HCL 4 MG/2ML IJ SOLN
4.0000 mg | Freq: Four times a day (QID) | INTRAMUSCULAR | Status: DC | PRN
  Administered 2023-02-14 – 2023-02-24 (×7): 4 mg via INTRAVENOUS
  Filled 2023-02-14 (×8): qty 2

## 2023-02-14 MED ORDER — HYDROCERIN EX CREA
TOPICAL_CREAM | Freq: Every day | CUTANEOUS | Status: DC
  Administered 2023-02-14: 1 via TOPICAL
  Filled 2023-02-14 (×3): qty 113

## 2023-02-14 MED ORDER — PROCHLORPERAZINE EDISYLATE 10 MG/2ML IJ SOLN
10.0000 mg | Freq: Four times a day (QID) | INTRAMUSCULAR | Status: DC | PRN
  Administered 2023-02-14: 10 mg via INTRAVENOUS
  Filled 2023-02-14: qty 2

## 2023-02-14 NOTE — Consult Note (Signed)
PHARMACY CONSULT NOTE - ELECTROLYTES  Pharmacy Consult for Electrolyte Monitoring and Replacement   Recent Labs: Potassium (mmol/L)  Date Value  02/14/2023 3.9   Magnesium (mg/dL)  Date Value  16/11/9602 2.3   Calcium (mg/dL)  Date Value  54/10/8117 7.8 (L)   Albumin (g/dL)  Date Value  14/78/2956 2.5 (L)   Phosphorus (mg/dL)  Date Value  21/30/8657 3.0   Sodium (mmol/L)  Date Value  02/14/2023 140  10/26/2017 142   Height: 5' 2.01" (157.5 cm) Weight: 131.6 kg (290 lb 2 oz) IBW/kg (Calculated) : 50.12 Estimated Creatinine Clearance: 42.8 mL/min (A) (by C-G formula based on SCr of 1.37 mg/dL (H)).  Assessment  [name] is a 82 y.o. female presenting with respiratory distress / PEA arrest / septic shock requiring vasopressors. PMH significant for hypertension, diabetes, CHF, aortic stenosis status post TAVR, and nonalcoholic fatty liver disease . Pharmacy has been consulted to monitor and replace electrolytes.  Goal of Therapy:  Potassium 4.0 - 5.1 mmol/L Magnesium 2.0 - 2.4 mg/dL All Other Electrolytes WNL  Plan:  --20 mEq po KCl x 1 --Electrolytes tomorrow AM  Thank you for allowing pharmacy to be a part of this patient's care.  [name] 02/14/2023 7:00 AM

## 2023-02-14 NOTE — Progress Notes (Addendum)
Progress Note  Patient Name: [name] Date of Encounter: 02/14/2023  Primary Cardiologist: New - previously followed by structural heart   Subjective   Chest sore from CPR, otherwise no symptoms of cardiac decompensation.   Inpatient Medications    Scheduled Meds:  atorvastatin  40 mg Oral Daily   cefUROXime  500 mg Oral BID WC   Chlorhexidine Gluconate Cloth  6 each Topical Daily   doxycycline  100 mg Oral Q12H   insulin aspart  0-15 Units Subcutaneous Q4H   Continuous Infusions:  norepinephrine (LEVOPHED) Adult infusion Stopped (02/13/23 1901)   PRN Meds: morphine injection, ondansetron (ZOFRAN) IV, mouth rinse, polyethylene glycol   Vital Signs    Vitals:   02/14/23 0500 02/14/23 0600 02/14/23 0800 02/14/23 0900  BP: 126/60 (!) 86/67 (!) 128/91 112/89  Pulse: 79 73 69 78  Resp: (!) 33 (!) 25 (!) 27 18  Temp:      TempSrc:      SpO2: 93% 100% 100% 92%  Weight:      Height:        Intake/Output Summary (Last 24 hours) at 02/14/2023 0937 Last data filed at 02/14/2023 0300 Gross per 24 hour  Intake 959.32 ml  Output 1100 ml  Net -140.68 ml   Filed Weights   02/11/23 1200 02/11/23 1700  Weight: 125.4 kg 131.6 kg    Telemetry    SR with PVCs - Personally Reviewed  ECG     - Personally Reviewed  Physical Exam   GEN: No acute distress.   Neck: JVD difficult to assess secondary to body habitus. Cardiac: RRR, II/VI systolic murmur RUSB, no rubs, or gallops. Anterior chest wall is tender to palpitation. Respiratory: Diminished breath sounds bilaterally.  GI: Soft, nontender, non-distended.   MS: Bilateral lower extremity erythema with 1+ edema to the mid shins; No deformity. Neuro:  Alert and oriented x 3; Nonfocal.  Psych: Normal affect.  Labs    Chemistry Recent Labs  Lab 02/11/23 1134 02/11/23 1931 02/12/23 0405 02/13/23 0351 02/14/23 0239  NA 139   < > 135 139 140  K 2.9*   < > 4.5 3.6 3.9  CL 111   < > 103 104 105  CO2 21*   <  > 25 25 27   GLUCOSE 270*   < > 224* 195* 142*  BUN 21   < > 28* 28* 23  CREATININE 1.11*   < > 1.31* 1.53* 1.37*  CALCIUM 6.1*   < > 7.8* 7.7* 7.8*  PROT 5.2*  --   --   --   --   ALBUMIN 2.2*  --  2.5* 2.6* 2.5*  AST 33  --   --   --   --   ALT 16  --   --   --   --   ALKPHOS 65  --   --   --   --   BILITOT 0.5  --   --   --   --   GFRNONAA 50*   < > 41* 34* 39*  ANIONGAP 7   < > 7 10 8    < > = values in this interval not displayed.     Hematology Recent Labs  Lab 02/12/23 0405 02/13/23 0351 02/14/23 0239  WBC 11.1* 12.1* 8.2  RBC 3.50* 3.43* 3.23*  HGB 10.0* 9.8* 9.2*  HCT 31.9* 31.3* 29.9*  MCV 91.1 91.3 92.6  MCH 28.6 28.6 28.5  MCHC 31.3 31.3 30.8  RDW  15.1 15.6* 15.4  PLT 166 152 126*    Cardiac EnzymesNo results for input(s): "TROPONINI" in the last 168 hours. No results for input(s): "TROPIPOC" in the last 168 hours.   BNP Recent Labs  Lab 02/11/23 1023 02/11/23 1424 02/12/23 0405  BNP 1,124.0* 400.3* 623.5*     DDimer No results for input(s): "DDIMER" in the last 168 hours.   Radiology    DG Chest Port 1 View Result Date: 02/11/2023 IMPRESSION: 1. Left IJ central venous catheter tip over the SVC confluence. No pneumothorax. 2. Cardiomegaly with vascular congestion. Patchy left greater than right basilar airspace disease, atelectasis versus pneumonia. This appears worse compared to prior Electronically Signed   By: Jasmine Pang M.D.   On: 02/11/2023 21:00   CT Angio Chest PE W/Cm &/Or Wo Cm Result Date: 02/11/2023 IMPRESSION: 1. No large central pulmonary embolus. No evidence for lobar or segmental pulmonary embolus. Subsegmental pulmonary arteries to both lower lobes are not reliably evaluated due to bolus timing and motion artifact. 2. Interlobular septal thickening in the upper lobes bilaterally with bilateral asymmetric central predominant ground-glass opacity. Imaging features are compatible with pulmonary edema. 3. Collapse/consolidative disease in  the lower lungs bilaterally is associated with small bilateral pleural effusions. Lower lobe disease compatible with atelectasis and/or pneumonia. 4. Bilateral pulmonary nodules noted, unchanged since remote prior where visible although portions of both lungs have been obscured by airspace disease. Consider follow-up CT chest without contrast after resolution of acute symptoms for more definitive evaluation of pulmonary nodules. 5. Cirrhosis. 6.  Aortic Atherosclerosis (ICD10-I70.0). Electronically Signed   By: Kennith Center M.D.   On: 02/11/2023 15:13   CT Head Wo Contrast Result Date: 02/11/2023 IMPRESSION: No evidence of acute intracranial abnormality. An MRI could provide more sensitive evaluation for hypoxic/ischemic injury if clinically warranted Electronically Signed   By: Feliberto Harts M.D.   On: 02/11/2023 14:12   DG Abdomen 1 View Result Date: 02/11/2023 IMPRESSION: 1. Enteric catheter tip projecting over the gastric fundus. Electronically Signed   By: Sharlet Salina M.D.   On: 02/11/2023 11:25   DG Chest Portable 1 View Result Date: 02/11/2023 IMPRESSION: 1. Support devices as above. 2. Pulmonary vascular congestion without overt edema. Electronically Signed   By: Sharlet Salina M.D.   On: 02/11/2023 11:23    Cardiac Studies   [facility] 11/02/2017: Mid RCA lesion is 10% stenosed. Prox LAD to Mid LAD lesion is 10% stenosed.   1. Mild non-obstructive CAD 2. Severe aortic stenosis (peak to peak gradient 62 mmHg, mean gradient 51.6 mmHg, AVA 0.82 cm2)   Recommendations: Will continue workup for TAVR.  __________   2D echo 01/28/2021: 1. Left ventricular ejection fraction, by estimation, is 55 to 60%. The  left ventricle has normal function. The apical inferoseptal wall and apex  appear hypokinetic (although views are slightly off axis). There is  moderate hypertrophy of the basal  septum. The rest of the LV segments demonstrate mild-to-moderate  concentric left ventricular  hypertrophy. Left ventricular diastolic  parameters are consistent with Grade II diastolic dysfunction  (pseudonormalization). Elevated left atrial pressure.   2. Right ventricular systolic function is normal. The right ventricular  size is normal. There is mildly elevated pulmonary artery systolic  pressure. The estimated right ventricular systolic pressure is 39.2 mmHg.   3. Left atrial size was mildly dilated.   4. The mitral valve is abnormal. Trivial mitral valve regurgitation.  There is severe mitral annular calcification with thickening and  calcification  of the mitral valve leaflets. No significant mitral  stenosis. MVA by VTI 2.68cm2, mean gradient at  HR 83bpm.   5. The aortic valve has been repaired/replaced. There is a 26 mm  CoreValve-Evolut Pro prosthetic (TAVR) valve present in the aortic  position. Procedure Date: 12/06/17. Echo findings are consistent with  normal structure and function of the aortic valve  prosthesis. The mean gradient is , Vmax 1.8 m/s, DI 0.84. There is  mild paravalvular leak best seen on apical 5C view.   Comparison(s): TTE on 12/06/18 the EF was 60-65%. AV mean,  peak. The wall motion seen on current study was not present on prior,  however, views on current study are slightly off-axis.  __________   2D echo 10/20/2022 [facility]): Summary   1. The left ventricle is normal in size with mildly increased wall  thickness.   2. The left ventricular systolic function is mildly decreased, LVEF is  visually estimated at 45%.    3. The apex and the mid-anterior segment appear hypokinetic.    4. The right ventricle is normal in size, with normal systolic function.    5. Aortic valve replacement (26 mm Medtronic evolute TAVR, implantation  date: 12-06-2017).    6. Aortic valve Doppler indices are consistent with normal prosthetic valve  function.   7. There is mild tricuspid regurgitation.    8. The IVC is not well visualized  precluding the ability to accurate assess  right atrial pressure.    9. Technically difficult study.    10. An ultrasound enhancing agent was used to improve the visualization of the left ventricular cavity and endocardial borders.  __________  2D echo 02/12/2023: 1. Left ventricular ejection fraction, by estimation, is 30 to 35%. The  left ventricle has moderately decreased function. The left ventricle  demonstrates regional wall motion abnormalities (see scoring  diagram/findings for description) and global  hypokinesis. There is mild concentric left ventricular hypertrophy. Left  ventricular diastolic parameters are consistent with Grade II diastolic  dysfunction (pseudonormalization). Elevated left ventricular end-diastolic  pressure. There is akinesis of the   left ventricular, apical septal wall.   2. Right ventricular systolic function is normal. The right ventricular  size is normal.   3. Left atrial size was severely dilated.   4. The mitral valve is degenerative. No evidence of mitral valve  regurgitation. Mild mitral stenosis. The mean mitral valve gradient is 4.0  mmHg. Severe mitral annular calcification.   5. The aortic valve has been repaired/replaced. Perivalvular Aortic valve  regurgitation is mild. 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  There is a 26 mm CoreValve-Evolut Pro prosthetic (TAVR) valve present in  the aortic position. Aortic  regurgitation PHT measures 557 msec. Aortic valve area, by VTI measures  0.93 cm. Aortic valve mean gradient measures 15.0 mmHg. Aortic valve Vmax  measures 2.70 m/s. DVI 0.37.   6. The inferior vena cava is normal in size with greater than 50%  respiratory variability, suggesting right atrial pressure of 3 mmHg.   7. Compared to echo dated 01/28/2021, LVF has declined but apical  inferoseptal wall motion abnormality is unchanged. The mean TAVR gradient  has increased from to and DVI has decreased from 0.92  to  0.37.   Patient Profile     82 y.o. female with history of nonobstructive CAD by [facility] in 2019, severe aortic stenosis status post TAVR in 11/2017, HFpEF, fibromyalgia, DM2 HTN, HLD, and obesity  who is  being seen today for the evaluation of elevated troponin at the request of Zada Girt, NP.   Assessment & Plan    Respiratory arrest with presumed septic shock and elevated high-sensitivity troponin with HFrEF:  -Suspected to be a primary respiratory event, extubated 12/21 -Elevated high-sensitivity troponin peaking at 1153, cannot exclude NSTEMI, though favor supply/demand ischemia in the setting of her arrest  -Has completed 48 hours of IV heparin  -Echo with progressive cardiomyopathy with an EF of 30-35% (prior 45% at [facility] in 09/2022)  -Pre-TAVR cath in 2019 showed minimal nonobstructive disease estimated at 10% involving the proximal to mid LAD and RCA  -Episode of chest pain overnight into 12/22 lasting 10 minutes and spontaneously resolving, chest pain free since -NPO at midnight for [facility] 02/15/2023 -Renal function improving with the holding of IV Lasix -Add GDMT as able moving forward, unable to at this time given intermittent hypotension, no longer requiring vasopressor support  -Ongoing management per critical care medicine   History of severe aortic stenosis:  -Status post TAVR in 2019  -Echo this admission with mild aortic prosthesis regurgitation with mild to moderate stenosis -Outpatient follow up  Electrolyte derangements:  -Improving  -Replete per critical care medicine protocol   CKD stage II:  -Renal function improving off Lasix -Cannot exclude ATN from acute illness and respiratory arrest   Normocytic anemia:  -Largely stable -Monitor on heparin drip     Informed Consent   Shared Decision Making/Informed Consent{  The risks [stroke (1 in 1000), death (1 in 1000), kidney failure [usually temporary] (1 in 500), bleeding (1 in 200), allergic reaction  [possibly serious] (1 in 200)], benefits (diagnostic support and management of coronary artery disease) and alternatives of a cardiac catheterization were discussed in detail with Ms. Kentner and she is willing to proceed.         For questions or updates, please contact [facility] Please consult [email or web address] for contact info under Cardiology/STEMI.    Signed, Eula Listen, PA-C [facility] Pager: [phone] 02/14/2023, 9:37 AM

## 2023-02-14 NOTE — Plan of Care (Signed)
  Problem: Education: Goal: Knowledge of General Education information will improve Description: Including pain rating scale, medication(s)/side effects and non-pharmacologic comfort measures Outcome: Progressing   Problem: Health Behavior/Discharge Planning: Goal: Ability to manage health-related needs will improve Outcome: Progressing   Problem: Clinical Measurements: Goal: Ability to maintain clinical measurements within normal limits will improve Outcome: Progressing Goal: Will remain free from infection Outcome: Progressing Goal: Diagnostic test results will improve Outcome: Progressing Goal: Respiratory complications will improve Outcome: Progressing Goal: Cardiovascular complication will be avoided Outcome: Progressing   Problem: Activity: Goal: Risk for activity intolerance will decrease Outcome: Progressing   Problem: Nutrition: Goal: Adequate nutrition will be maintained Outcome: Progressing   Problem: Coping: Goal: Level of anxiety will decrease Outcome: Progressing   Problem: Elimination: Goal: Will not experience complications related to bowel motility Outcome: Progressing Goal: Will not experience complications related to urinary retention Outcome: Progressing   Problem: Pain Management: Goal: General experience of comfort will improve Outcome: Progressing   Problem: Safety: Goal: Ability to remain free from injury will improve Outcome: Progressing   Problem: Skin Integrity: Goal: Risk for impaired skin integrity will decrease Outcome: Progressing   Problem: Activity: Goal: Ability to tolerate increased activity will improve Outcome: Progressing   Problem: Respiratory: Goal: Ability to maintain a clear airway and adequate ventilation will improve Outcome: Progressing   Problem: Role Relationship: Goal: Method of communication will improve Outcome: Progressing   Problem: Education: Goal: Ability to describe self-care measures that may  prevent or decrease complications (Diabetes Survival Skills Education) will improve Outcome: Progressing Goal: Individualized Educational Video(s) Outcome: Progressing   Problem: Coping: Goal: Ability to adjust to condition or change in health will improve Outcome: Progressing   Problem: Fluid Volume: Goal: Ability to maintain a balanced intake and output will improve Outcome: Progressing   Problem: Health Behavior/Discharge Planning: Goal: Ability to identify and utilize available resources and services will improve Outcome: Progressing Goal: Ability to manage health-related needs will improve Outcome: Progressing   Problem: Metabolic: Goal: Ability to maintain appropriate glucose levels will improve Outcome: Progressing   Problem: Nutritional: Goal: Maintenance of adequate nutrition will improve Outcome: Progressing Goal: Progress toward achieving an optimal weight will improve Outcome: Progressing   Problem: Skin Integrity: Goal: Risk for impaired skin integrity will decrease Outcome: Progressing   Problem: Tissue Perfusion: Goal: Adequacy of tissue perfusion will improve Outcome: Progressing

## 2023-02-14 NOTE — Progress Notes (Signed)
SLP Cancellation Note  Patient Details Name: [name] MRN: [id number] DOB: 07/25/1940   Cancelled treatment:       Reason Eval/Treat Not Completed: Medical issues which prohibited therapy (Pt just finished working with PT and with complaint of nausea. PO trials deferred.)  Clyde Canterbury, M.S., CCC-SLP Speech-Language Pathologist [facility] [phone] [name])  [name] 02/14/2023, 12:29 PM

## 2023-02-14 NOTE — NC FL2 (Signed)
[place] MEDICAID FL2 LEVEL OF CARE FORM     IDENTIFICATION  Patient Name: [name] Birthdate: 1940/05/31 Sex: female Admission Date (Current Location): 02/11/2023  Fernando Salinas and [place] Number:  Chiropodist and Address:  [facility], [address] [place], [place] [address]      Provider Number: [id number]  Attending Physician Name and Address:  Pennie Banter, DO  Relative Name and Phone Number:       Current Level of Care: Hospital Recommended Level of Care: Skilled Nursing Facility Prior Approval Number:    Date Approved/Denied:   PASRR Number: [id number]  Discharge Plan: SNF    Current Diagnoses: Patient Active Problem List   Diagnosis Date Noted   Cardiac arrest (HCC) 02/14/2023   Cardiac arrest due to respiratory disorder (HCC) 02/12/2023   Coronary artery disease due to lipid rich plaque 02/12/2023   Elevated troponin 02/12/2023   Shock circulatory (HCC) 02/11/2023   Osteomyelitis of left foot (HCC) 01/23/2022   Cellulitis of left lower extremity 01/22/2022   Acute on chronic heart failure with preserved ejection fraction (HFpEF) (HCC) 01/22/2022   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   Morbid obesity (HCC)    Psoriasis    Hypertension    Hyperlipemia    Fibromyalgia    Non-insulin dependent type 2 diabetes mellitus (HCC)    Severe aortic stenosis    NAFLD (nonalcoholic fatty liver disease) 86/57/8469   COLONIC POLYPS, ADENOMATOUS 07/27/2004   EXTERNAL HEMORRHOIDS 07/27/2004    Orientation RESPIRATION BLADDER Height & Weight     Time, Situation, Self, Place  O2 (Nasal Cannula 2 L) Continent, External catheter Weight: 290 lb 2 oz (131.6 kg) Height:  5' 2.01" (157.5 cm)  BEHAVIORAL SYMPTOMS/MOOD NEUROLOGICAL BOWEL NUTRITION STATUS   (None)  (None) Continent Diet (DYS 3. Extra gravies, sauces, condiments.)  AMBULATORY STATUS COMMUNICATION OF NEEDS Skin   Extensive Assist Verbally  Skin abrasions, Other (Comment) (Blister, Erythema/redness.)                       Personal Care Assistance Level of Assistance  Bathing, Feeding, Dressing Bathing Assistance: Maximum assistance Feeding assistance: Limited assistance Dressing Assistance: Maximum assistance     Functional Limitations Info  Sight, Hearing, Speech Sight Info: Adequate Hearing Info: Adequate Speech Info: Adequate    SPECIAL CARE FACTORS FREQUENCY  PT (By licensed PT), OT (By licensed OT), Speech therapy     PT Frequency: 5 x week OT Frequency: 5 x week     Speech Therapy Frequency: 5 x week      Contractures Contractures Info: Not present    Additional Factors Info  Code Status, Allergies Code Status Info: Full code Allergies Info: Castor Oil, Sulfonamide Derivatives, Amoxicillin, Demerol (Meperidine), Hydrocodone           Current Medications (02/14/2023):  This is the current hospital active medication list Current Facility-Administered Medications  Medication Dose Route Frequency Provider Last Rate Last Admin   atorvastatin (LIPITOR) tablet 40 mg  40 mg Oral Daily Armanda Magic R, MD   40 mg at 02/14/23 6295   cefUROXime (CEFTIN) tablet 500 mg  500 mg Oral BID WC Vida Rigger, MD   500 mg at 02/14/23 1614   Chlorhexidine Gluconate Cloth 2 % PADS 6 each  6 each Topical Daily Vida Rigger, MD   6 each at 02/14/23 0821   doxycycline (VIBRA-TABS) tablet 100 mg  100 mg Oral Q12H Tressie Ellis,  RPH   100 mg at 02/14/23 0831   fentaNYL (SUBLIMAZE) injection 25 mcg  25 mcg Intravenous Q2H PRN Pennie Banter, DO   25 mcg at 02/14/23 1530   hydrocerin (EUCERIN) cream   Topical Daily Esaw Grandchild A, DO   1 Application at 02/14/23 1532   insulin aspart (novoLOG) injection 0-15 Units  0-15 Units Subcutaneous Q4H Rust-Chester, Britton L, NP   2 Units at 02/14/23 1614   morphine (PF) 2 MG/ML injection 2 mg  2 mg Intravenous Q4H PRN Esaw Grandchild A, DO   2 mg at 02/14/23 0830    mupirocin ointment (BACTROBAN) 2 %   Topical BID Esaw Grandchild A, DO   1 Application at 02/14/23 1243   ondansetron (ZOFRAN) injection 4 mg  4 mg Intravenous Q6H PRN Jimmye Norman, NP   4 mg at 02/14/23 0830   Oral care mouth rinse  15 mL Mouth Rinse PRN Vida Rigger, MD       polyethylene glycol (MIRALAX / GLYCOLAX) packet 17 g  17 g Oral Daily PRN Ezequiel Essex, NP       prochlorperazine (COMPAZINE) injection 10 mg  10 mg Intravenous Q6H PRN Pennie Banter, DO         Discharge Medications: Please see discharge summary for a list of discharge medications.  Relevant Imaging Results:  Relevant Lab Results:   Additional Information SS#: [id number]  Margarito Liner, LCSW

## 2023-02-14 NOTE — Consult Note (Addendum)
WOC Nurse Consult Note: patient noted to have amputation of toes to B feet that are well healed, some dry scaly skin present on feet, no open wounds.  Patient states she has recurrent "blisters" on lower legs related to swelling; appears classic for venous ulcers  Reason for Consult: lower extremity wounds  Wound type: 1. full thickness likely r/t venous insufficiency  2.  Abrasion R face from unknown trauma  Pressure Injury POA: NA  Measurement: 1.  Left lower leg with multiple areas of full thickness ulceration L anterior lower leg 2 cm x 1 cm x 0.1 cm; superior to this 1 cm x 1 cm x 0.1 cm; distal aspect of L anterior lower leg with 2 scabbed area; L foot 1 cm x 1 cm area of partial thickness skin loss  No open wounds noted at this time to R lower leg  2.  R face 1 cm x 1 cm x 0.1 cm 100% pink moist area of abrasion  Wound bed: pink moist  Drainage (amount, consistency, odor) weeping clear fluid  Periwound: edema, erythema, dry scaly skin  Dressing procedure/placement/frequency:  Cleanse L lower leg wounds with NS, apply silver hydrofiber [place] [id number]) to open wound beds daily, cover remaining intact skin of legs and feet with Eucerin cream (DO NOT PLACE IN BETWEEN TOES).  Cover anterior and posterior Left leg with ABD pads and wrap with Kerlix roll gauze beginning just above toes and ending right below knees.  Secure with Ace bandage for light compression.    No active wounds on R leg, if begins to weep can perform same wound care as L leg daily. Patient currently being treated for cellulitis.  Legs do have warmth and erythema present.  2.  Clean R facial abrasion with NS, apply Mupirocin ointment 2 times a day. May cover with silicone foam or leave open to air whichever is preferred.    Patient would benefit from consult to wound care center or vascular surgeon for ongoing management of what appears to be vascular insufficiency.    POC discussed with primary nurse.  WOC team will not  follow. Re-consult if further needs arise.   Thank you,    Priscella Mann MSN, RN-BC, [employer] [phone]

## 2023-02-14 NOTE — Evaluation (Signed)
Occupational Therapy Evaluation Patient Details Name: [name] MRN: [id number] DOB: [birth year] Today's Date: 02/14/2023   History of Present Illness Pt is an 82 y/o F admitted on 02/11/23. Pt was d/c earlier in Dec. 2024 after being treated for BLE cellulitis & L toe infected ulcer with underlying fx of phalanx & osteomyelitis. Pt was on the way to the hospital for an appointment & found to be hypoxic. Pt required intubation for severe hypoxemia & developed circulatory shock. Pt was extubated 02/12/23. PMH: essential HTN, HF with preserved LVEF, severe AS s/p TAVR, HTN, DM2, diabetic ulcers, fatty liver disease, GERD, chronic dysphagia, colon polyps, L great toe amputation 01/24/23   Clinical Impression   Pt was seen for OT evaluation this date. Pt presents to acute OT demonstrating impaired ADL performance and functional mobility 2/2 decreased activity tolerance, strength, balance, and cognition (See OT problem list for additional functional deficits). Pt currently requires MIN-MOD A for bed mobility, supv versus SBA for sitting balance while completing grooming tasks 2/2 intermittent waves of nausea, per pt report. Pt would benefit from skilled OT services to address noted impairments and functional limitations (see below for any additional details) in order to maximize safety and independence while minimizing falls risk and caregiver burden.       If plan is discharge home, recommend the following: A lot of help with walking and/or transfers;A lot of help with bathing/dressing/bathroom;Assistance with cooking/housework;Assist for transportation;Help with stairs or ramp for entrance;Direct supervision/assist for medications management;Supervision due to cognitive status    Functional Status Assessment  Patient has had a recent decline in their functional status and demonstrates the ability to make significant improvements in function in a reasonable and predictable amount of time.   Equipment Recommendations  Other (comment) (defer to next venue)    Recommendations for Other Services       Precautions / Restrictions Precautions Precautions: Fall Restrictions Weight Bearing Restrictions Per Provider Order: Yes LLE Weight Bearing Per Provider Order: Weight bearing as tolerated (in post op shoe, per last admission)      Mobility Bed Mobility Overal bed mobility: Needs Assistance Bed Mobility: Supine to Sit     Supine to sit: Mod assist, Min assist, HOB elevated, Used rails     General bed mobility comments: pt limited slightly by nausea    Transfers                          Balance Overall balance assessment: Needs assistance Sitting-balance support: Feet supported, Feet unsupported, Bilateral upper extremity supported, No upper extremity supported, Single extremity supported Sitting balance-Leahy Scale: Fair Sitting balance - Comments: intermittent BUE vs UE support                                   ADL either performed or assessed with clinical judgement   ADL Overall ADL's : Needs assistance/impaired     Grooming: Sitting;Set up;Supervision/safety;Oral care               Lower Body Dressing: Sitting/lateral leans;Maximal assistance                       Vision         Perception         Praxis         Pertinent Vitals/Pain Pain Assessment Pain Assessment: Faces Faces Pain Scale: Hurts  a little bit Pain Location: chest with bed mobility Pain Descriptors / Indicators: Guarding, Grimacing, Discomfort Pain Intervention(s): Limited activity within patient's tolerance, Monitored during session, Repositioned     Extremity/Trunk Assessment Upper Extremity Assessment Upper Extremity Assessment: Generalized weakness   Lower Extremity Assessment Lower Extremity Assessment: Generalized weakness (BLE erythema distally, BLE great toe amputations)       Communication  Communication Communication: Hearing impairment   Cognition Arousal: Alert Behavior During Therapy: WFL for tasks assessed/performed Overall Cognitive Status: Impaired/Different from baseline Area of Impairment: Memory, Following commands, Safety/judgement, Awareness, Problem solving                 Orientation Level: Disoriented to, Time, Situation   Memory: Decreased short-term memory Following Commands: Follows one step commands consistently, Follows one step commands with increased time Safety/Judgement: Decreased awareness of safety, Decreased awareness of deficits Awareness: Emergent Problem Solving: Slow processing, Requires verbal cues       General Comments  Pt on supplemental O2 during session    Exercises     Shoulder Instructions      Home Living Family/patient expects to be discharged to:: Private residence Living Arrangements: Spouse/significant other (reports she lives with son) Available Help at Discharge: Family Type of Home: House Home Access: Stairs to enter Secretary/administrator of Steps: 2-3 Entrance Stairs-Rails: Right Home Layout: Two level;Able to live on main level with bedroom/bathroom     Bathroom Shower/Tub: Walk-in shower         Home Equipment: Cane - single Librarian, academic (2 wheels);Rollator (4 wheels)          Prior Functioning/Environment               Mobility Comments: ambulatory with rollator x several years, denies falls, no longer drives ADLs Comments: independent with bathing & dressing, light cooking & cleaning but primarily eats out        OT Problem List: Decreased strength;Pain;Cardiopulmonary status limiting activity;Decreased activity tolerance;Impaired balance (sitting and/or standing);Decreased knowledge of use of DME or AE;Decreased cognition      OT Treatment/Interventions: Self-care/ADL training;Therapeutic exercise;Therapeutic activities;Energy conservation;DME and/or AE  instruction;Patient/family education;Balance training    OT Goals(Current goals can be found in the care plan section) Acute Rehab OT Goals Patient Stated Goal: never do this again OT Goal Formulation: With patient Time For Goal Achievement: 02/28/23 Potential to Achieve Goals: Good ADL Goals Pt Will Perform Lower Body Dressing: with min assist;sit to/from stand Pt Will Transfer to Toilet: with supervision;ambulating;bedside commode (LRAD) Pt Will Perform Toileting - Clothing Manipulation and hygiene: with modified independence Additional ADL Goal #1: Pt will utilize learned ECS during ADL and mobility tasks to improve safety and minimize over exertion, 3/3 opportunities.  OT Frequency: Min 1X/week    Co-evaluation              AM-PAC OT "6 Clicks" Daily Activity     Outcome Measure Help from another person eating meals?: None Help from another person taking care of personal grooming?: A Little Help from another person toileting, which includes using toliet, bedpan, or urinal?: A Lot Help from another person bathing (including washing, rinsing, drying)?: A Lot Help from another person to put on and taking off regular upper body clothing?: A Little Help from another person to put on and taking off regular lower body clothing?: A Lot 6 Click Score: 16   End of Session Equipment Utilized During Treatment: Oxygen  Activity Tolerance: Patient tolerated treatment well Patient left: in bed;with call bell/phone  within reach;Other (comment) (seated EOB with PT)  OT Visit Diagnosis: Other abnormalities of gait and mobility (R26.89);Muscle weakness (generalized) (M62.81)                Time: 2536-6440 OT Time Calculation (min): 22 min Charges:  OT General Charges $OT Visit: 1 Visit OT Evaluation $OT Eval Moderate Complexity: 1 Mod OT Treatments $Self Care/Home Management : 8-22 mins  Arman Filter., MPH, MS, OTR/L ascom [phone] 02/14/23, 2:23 PM

## 2023-02-14 NOTE — TOC Initial Note (Signed)
Transition of Care [facility]) - Initial/Assessment Note    Patient Details  Name: [name] MRN: [id number] Date of Birth: November 23, 1940  Transition of Care [facility]) CM/SW Contact:    Margarito Liner, LCSW Phone Number: 02/14/2023, 4:37 PM  Clinical Narrative:  CSW met with patient. No supports at bedside. CSW introduced role and explained that therapy recommendations would be discussed. Patient is agreeable to SNF placement. Gave CMS scores for facilities within 25 miles of her zip code. No further concerns. CSW encouraged patient to contact CSW as needed. CSW will continue to follow patient for support and facilitate discharge to SNF once medically stable.                Expected Discharge Plan: Skilled Nursing Facility Barriers to Discharge: Continued Medical Work up   Patient Goals and CMS Choice   CMS Medicare.gov Compare Post Acute Care list provided to:: Patient        Expected Discharge Plan and Services     Post Acute Care Choice: Skilled Nursing Facility Living arrangements for the past 2 months: Single Family Home                                      Prior Living Arrangements/Services Living arrangements for the past 2 months: Single Family Home Lives with:: Adult Children Patient language and need for interpreter reviewed:: Yes Do you feel safe going back to the place where you live?: Yes      Need for Family Participation in Patient Care: Yes (Comment) Care giver support system in place?: Yes (comment)   Criminal Activity/Legal Involvement Pertinent to Current Situation/Hospitalization: No - Comment as needed  Activities of Daily Living      Permission Sought/Granted Permission sought to share information with : Facility Industrial/product designer granted to share information with : Yes, Verbal Permission Granted     Permission granted to share info w AGENCY: SNF's        Emotional Assessment Appearance:: Appears stated  age Attitude/Demeanor/Rapport: Engaged, Gracious Affect (typically observed): Accepting, Appropriate, Calm, Pleasant Orientation: : Oriented to Self, Oriented to Place, Oriented to  Time, Oriented to Situation Alcohol / Substance Use: Not Applicable Psych Involvement: No (comment)  Admission diagnosis:  Hypocalcemia [E83.51] Cardiac arrest [facility]) [I46.9] Shock circulatory (HCC) [R57.9] Acute respiratory failure with hypoxia and hypercapnia (HCC) [J96.01, J96.02] Patient Active Problem List   Diagnosis Date Noted   Cardiac arrest (HCC) 02/14/2023   Cardiac arrest due to respiratory disorder (HCC) 02/12/2023   Coronary artery disease due to lipid rich plaque 02/12/2023   Elevated troponin 02/12/2023   Shock circulatory (HCC) 02/11/2023   Osteomyelitis of left foot (HCC) 01/23/2022   Cellulitis of left lower extremity 01/22/2022   Acute on chronic heart failure with preserved ejection fraction (HFpEF) (HCC) 01/22/2022   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   Morbid obesity (HCC)    Psoriasis    Hypertension    Hyperlipemia    Fibromyalgia    Non-insulin dependent type 2 diabetes mellitus (HCC)    Severe aortic stenosis    NAFLD (nonalcoholic fatty liver disease) 32/35/5732   COLONIC POLYPS, ADENOMATOUS 07/27/2004   EXTERNAL HEMORRHOIDS 07/27/2004   PCP:  Kaleen Mask, MD Pharmacy:   [facility] [phone] - [place], [place] - [address] AT [facility] [address] [place] [address] Phone: [phone] Fax: [phone]  Social Drivers of Health (SDOH) Social History: SDOH Screenings   Food Insecurity: Patient Unable To Answer (02/11/2023)  Housing: Patient Unable To Answer (02/11/2023)  Utilities: Patient Unable To Answer (02/11/2023)  Financial Resource Strain: Low Risk  (10/22/2022)   Received from [facility]  Social Connections: Socially Isolated (10/22/2022)   Received from [facility]  Stress: No  Stress Concern Present (10/22/2022)   Received from [facility]  Tobacco Use: Medium Risk (02/11/2023)  Health Literacy: Low Risk  (10/22/2022)   Received from [facility]   SDOH Interventions:     Readmission Risk Interventions     No data to display

## 2023-02-14 NOTE — Progress Notes (Signed)
Foley catheter discontinued today. OK to place external purewick, per Lanora Manis, NP

## 2023-02-14 NOTE — Plan of Care (Signed)
  Problem: Education: Goal: Knowledge of General Education information will improve Description: Including pain rating scale, medication(s)/side effects and non-pharmacologic comfort measures Outcome: Progressing   Problem: Clinical Measurements: Goal: Ability to maintain clinical measurements within normal limits will improve Outcome: Progressing Goal: Will remain free from infection Outcome: Progressing Goal: Diagnostic test results will improve Outcome: Progressing Goal: Respiratory complications will improve Outcome: Progressing Goal: Cardiovascular complication will be avoided Outcome: Progressing   Problem: Nutrition: Goal: Adequate nutrition will be maintained Outcome: Progressing   Problem: Elimination: Goal: Will not experience complications related to bowel motility Outcome: Progressing Goal: Will not experience complications related to urinary retention Outcome: Progressing   Problem: Pain Management: Goal: General experience of comfort will improve Outcome: Progressing   Problem: Safety: Goal: Ability to remain free from injury will improve Outcome: Progressing

## 2023-02-14 NOTE — Progress Notes (Addendum)
Physical Therapy Treatment Patient Details Name: [name] MRN: [id number] DOB: Mar 28, 1940 Today's Date: 02/14/2023   History of Present Illness Pt is an 82 y/o F admitted on 02/11/23. Pt was d/c earlier in Dec. 2024 after being treated for BLE cellulitis & L toe infected ulcer with underlying fx of phalanx & osteomyelitis. Pt was on the way to the hospital for an appointment & found to be hypoxic. Pt required intubation for severe hypoxemia & developed circulatory shock. Pt was extubated 02/12/23. PMH: essential HTN, HF with preserved LVEF, severe AS s/p TAVR, HTN, DM2, diabetic ulcers, fatty liver disease, GERD, chronic dysphagia, colon polyps, L great toe amputation 01/24/23    PT Comments  Pt seen for PT tx in handoff from OT. Pt reporting intermittent nausea but agreeable to tx. Pt is able to complete STS & step pivot bed>recliner with RW & CGA. Pt endorses her "head feels funny" -- see below for BP. Gait attempts deferred 2/2 pt c/o symptoms & nausea. Pt performed BLE strengthening exercises with cuing for technique, noting BLE fatigue as well as "it pulls on my chest". Will continue to follow pt acutely to progress mobility as able.  BP checked in LUE:  Sitting EOB: 139/99 mmHg  Sitting in recliner after transfer: 147/123 mmHg MAP 132 Rechecked sitting in recliner: 149/96 mmHg MAP 113   Addendum: Pt does not have post op shoe in room, nor does she recall it when PT asks about it during last admission.   If plan is discharge home, recommend the following: A lot of help with walking and/or transfers;A lot of help with bathing/dressing/bathroom;Assistance with cooking/housework;Assist for transportation;Help with stairs or ramp for entrance   Can travel by private vehicle     No  Equipment Recommendations  Other (comment) (defer to next venue)    Recommendations for Other Services       Precautions / Restrictions Precautions Precautions: Fall Restrictions Weight Bearing  Restrictions Per Provider Order: Yes LLE Weight Bearing Per Provider Order: Weight bearing as tolerated (in post op shoe, per last admission)     Mobility  Bed Mobility               General bed mobility comments: not tested, pt received sitting EOB, left sitting in recliner    Transfers Overall transfer level: Needs assistance Equipment used: Rolling walker (2 wheels) Transfers: Sit to/from Stand Sit to Stand: Contact guard assist   Step pivot transfers: Contact guard assist       General transfer comment: cuing re: hand placement, RW management, technique for step pivot bed>recliner with RW    Ambulation/Gait                   Stairs             Wheelchair Mobility     Tilt Bed    Modified Rankin (Stroke Patients Only)       Balance Overall balance assessment: Needs assistance Sitting-balance support: Feet supported, Feet unsupported, Bilateral upper extremity supported Sitting balance-Leahy Scale: Fair     Standing balance support: During functional activity, Bilateral upper extremity supported, Reliant on assistive device for balance Standing balance-Leahy Scale: Fair                              Cognition Arousal: Alert Behavior During Therapy: WFL for tasks assessed/performed Overall Cognitive Status: Impaired/Different from baseline Area of Impairment: Memory, Following commands, Safety/judgement, Awareness,  Problem solving                     Memory: Decreased short-term memory Following Commands: Follows one step commands consistently, Follows one step commands with increased time Safety/Judgement: Decreased awareness of safety, Decreased awareness of deficits Awareness: Emergent   General Comments: appears to be more clear than yesterday        Exercises General Exercises - Lower Extremity Long Arc Quad: AROM, Seated, Strengthening, Both, 20 reps Hip Flexion/Marching: AROM, Seated, Strengthening, Both  (x4 each BLE)    General Comments General comments (skin integrity, edema, etc.): Pt on supplemental O2 during session      Pertinent Vitals/Pain Pain Assessment Pain Assessment: Faces Faces Pain Scale: Hurts little more Pain Location: chest "It's pulling on my chest" when performing seated BLE exercises Pain Descriptors / Indicators: Guarding, Grimacing, Discomfort Pain Intervention(s): Limited activity within patient's tolerance    Home Living                          Prior Function            PT Goals (current goals can now be found in the care plan section) Acute Rehab PT Goals Patient Stated Goal: get better PT Goal Formulation: With patient Time For Goal Achievement: 02/27/23 Potential to Achieve Goals: Good Progress towards PT goals: Progressing toward goals    Frequency    Min 1X/week      PT Plan      Co-evaluation              AM-PAC PT "6 Clicks" Mobility   Outcome Measure  Help needed turning from your back to your side while in a flat bed without using bedrails?: A Little Help needed moving from lying on your back to sitting on the side of a flat bed without using bedrails?: A Lot Help needed moving to and from a bed to a chair (including a wheelchair)?: A Little Help needed standing up from a chair using your arms (e.g., wheelchair or bedside chair)?: A Little Help needed to walk in hospital room?: A Lot Help needed climbing 3-5 steps with a railing? : Total 6 Click Score: 14    End of Session Equipment Utilized During Treatment: Oxygen Activity Tolerance: Patient tolerated treatment well;Patient limited by fatigue;Patient limited by pain Patient left: in chair;with call bell/phone within reach;with nursing/sitter in room Nurse Communication:  (BP) PT Visit Diagnosis: Muscle weakness (generalized) (M62.81);Difficulty in walking, not elsewhere classified (R26.2);Unsteadiness on feet (R26.81)     Time: 1610-9604 PT Time  Calculation (min) (ACUTE ONLY): 18 min  Charges:    $Therapeutic Activity: 8-22 mins PT General Charges $$ ACUTE PT VISIT: 1 Visit                     Aleda Grana, PT, DPT 02/14/23, 11:58 AM   Sandi Mariscal 02/14/2023, 11:54 AM

## 2023-02-14 NOTE — Progress Notes (Signed)
Lanora Manis, NP notified of patient being nauseated and has nothing ordered for nasuea. See new order for PRN Zofran IV 4 mg entered by NP.

## 2023-02-14 NOTE — Progress Notes (Signed)
Progress Note   Patient: [name] ZOX:[id number] DOB: [birth year] DOA: 02/11/2023     3 DOS: the patient was seen and examined on 02/14/2023   Brief hospital course: "82 year old female with a history of Essential hypertension, heart failure with preserved LVEF, Severe AS s/p TAVR, hypertension, diabetes type 2, diabetic ulcers, fatty liver disease, GERD, chronic dysphagia, history of colon polyps, was admitted and discharged earlier in dec 2024 with b/l LE cellulits and left toe infected ulcer with underlying fracture of phalanx and osteomyelitis. She apparently was on the way to hospital for routine appt and found to be hypoxemic in field. She required intubation for severe hypoxemia in ED. Patient seen in ER with circulatory shock on levophed while intubated..."  Pt required CPR in the ED and was initially admitted to ICU on the ventilator.  See PCCM notes from admission through 12/22. TRH assumed care 02/14/23.  Further hospital course and management as outlined below.   Assessment and Plan:  Cardiopulmonary arrest s/p CPR and ROSC Acute respiratory failure with hypoxia - liberated from ventilator 12/21 Presumed septic shock - POA, shock physiology is resolved. UTI - E coli Elevated troponin - NSTEMI not excluded Acute on Chronic HFrEF --Cardiology is following --Plan for R/L cath this admission (12/24, NPO at midnight) --s/p diuresis with IV Lasix, now on hold --Completed 48 hours IV heparin --Echo EF 30-35% (prior EF 45% in Aug 2024 at [facility]) --Additional GDMT per Cardiology as BP and renal function permit --Strict Io's and daily weights --Monitor renal function, electrolytes --Supplement O2, maintain sats > 90%, wean as tolerated --Pulmonary hygiene --On PO doxy and ceftin for UTI, cellulitis --Follow BAL and blood cultures  Bilateral lower extremity cellulitis Hx of left great toe osteomyelitis sp L hallux amputation (Dec 2024) --Continue antibiotics - on Ceftin and  Doxy  --Podiatry and WOC consulted --Monitor, wound care  Non-insulin dependent type 2 diabetes mellitus (HCC) Hold metformin. Sliding scale Novolog.   Stage II CKD  --Monitor renal function closely with diuresis --Avoid nephrotoxins and hypotension --Renally dose meds  Hyperlipemia --Lipitor 40 mg   Hypertension - presented in shock BP's are controlled --Holding antihypertensives  Fibromyalgia No acute issues.  Morbid obesity (HCC) Body mass index is 48.02 kg/m. Complicates overall care and prognosis.  Recommend lifestyle modifications including physical activity and diet for weight loss and overall long-term health.   NAFLD (nonalcoholic fatty liver disease) No acute issues.        Subjective: Pt seen in ICU this AM. She is having chest pain from CPR.  Otherwise she states feeling much better.  Physical Exam: Vitals:   02/14/23 0800 02/14/23 0900 02/14/23 1000 02/14/23 1100  BP: (!) 128/91 112/89 128/61 (!) 122/90  Pulse: 69 78 78 73  Resp: (!) 27 18 (!) 27 (!) 26  Temp:      TempSrc:      SpO2: 100% 92% 100% 99%  Weight:      Height:       General exam: awake, alert, no acute distress HEENT: moist mucus membranes, hearing grossly normal  Respiratory system: CTAB with diminished bases bilaterally, no wheezes, rales or rhonchi, normal respiratory effort. Cardiovascular system: normal S1/S2, RRR, Left internal jugular central line in place  Gastrointestinal system: soft, NT, ND, no HSM felt, +bowel sounds. Central nervous system: A&O x 3. no gross focal neurologic deficits, normal speech Extremities: BLE erythema without differential warmth, no edema, normal tone Skin: dry, intact, normal temperature Psychiatry: normal mood, congruent affect, judgement  and insight appear normal   Data Reviewed:  Notable labs ---   glucose 142, Cr 1.37 improved from 1.53, Ca 7.8, Hbg stable 9.2  Family Communication: None present, will attempt to  call  Disposition: Status is: Inpatient Remains inpatient appropriate because: ongoing evaluation, IV therapies, needs clearance by consultants for d/c.   May need SNF placement   Planned Discharge Destination: Skilled nursing facility vs [facility]    Time spent: 45 minutes  Author: Pennie Banter, DO 02/14/2023 1:29 PM  For on call review [email or web address].

## 2023-02-15 ENCOUNTER — Encounter
Admission: EM | Disposition: A | Discharge status: Skilled Nursing Facility | Payer: Self-pay | Source: Home / Self Care | Attending: Student

## 2023-02-15 DIAGNOSIS — I5021 Acute systolic (congestive) heart failure: Secondary | ICD-10-CM | POA: Diagnosis not present

## 2023-02-15 DIAGNOSIS — R579 Shock, unspecified: Secondary | ICD-10-CM | POA: Diagnosis not present

## 2023-02-15 DIAGNOSIS — I469 Cardiac arrest, cause unspecified: Secondary | ICD-10-CM | POA: Diagnosis not present

## 2023-02-15 LAB — CULTURE, RESPIRATORY W GRAM STAIN: Culture: NORMAL

## 2023-02-15 LAB — RENAL FUNCTION PANEL
Albumin: 2.6 g/dL — ABNORMAL LOW (ref 3.5–5.0)
Anion gap: 6 (ref 5–15)
BUN: 24 mg/dL — ABNORMAL HIGH (ref 8–23)
CO2: 26 mmol/L (ref 22–32)
Calcium: 8.1 mg/dL — ABNORMAL LOW (ref 8.9–10.3)
Chloride: 106 mmol/L (ref 98–111)
Creatinine, Ser: 1.25 mg/dL — ABNORMAL HIGH (ref 0.44–1.00)
GFR, Estimated: 44 mL/min — ABNORMAL LOW (ref >60)
Glucose, Bld: 133 mg/dL — ABNORMAL HIGH (ref 70–99)
Phosphorus: 3.5 mg/dL (ref 2.5–4.6)
Potassium: 4.7 mmol/L (ref 3.5–5.1)
Sodium: 138 mmol/L (ref 135–145)

## 2023-02-15 LAB — GLUCOSE, CAPILLARY
Glucose-Capillary: 147 mg/dL — ABNORMAL HIGH (ref 70–99)
Glucose-Capillary: 133 mg/dL — ABNORMAL HIGH (ref 70–99)
Glucose-Capillary: 107 mg/dL — ABNORMAL HIGH (ref 70–99)
Glucose-Capillary: 153 mg/dL — ABNORMAL HIGH (ref 70–99)
Glucose-Capillary: 148 mg/dL — ABNORMAL HIGH (ref 70–99)

## 2023-02-15 LAB — CBC
HCT: 30.1 % — ABNORMAL LOW (ref 36.0–46.0)
Hemoglobin: 9 g/dL — ABNORMAL LOW (ref 12.0–15.0)
MCH: 28.5 pg (ref 26.0–34.0)
MCHC: 29.9 g/dL — ABNORMAL LOW (ref 30.0–36.0)
MCV: 95.3 fL (ref 80.0–100.0)
Platelets: 139 10*3/uL — ABNORMAL LOW (ref 150–400)
RBC: 3.16 MIL/uL — ABNORMAL LOW (ref 3.87–5.11)
RDW: 15.3 % (ref 11.5–15.5)
WBC: 7.6 10*3/uL (ref 4.0–10.5)
nRBC: 0 % (ref 0.0–0.2)

## 2023-02-15 LAB — MAGNESIUM: Magnesium: 2.4 mg/dL (ref 1.7–2.4)

## 2023-02-15 SURGERY — RIGHT/LEFT HEART CATH AND CORONARY ANGIOGRAPHY
Anesthesia: Moderate Sedation

## 2023-02-15 MED ORDER — ASPIRIN 81 MG PO CHEW
CHEWABLE_TABLET | ORAL | Status: AC
  Filled 2023-02-15: qty 1

## 2023-02-15 MED ORDER — ASPIRIN 81 MG PO CHEW: 81.0000 mg | CHEWABLE_TABLET | ORAL | Status: DC

## 2023-02-15 MED ORDER — ASPIRIN 81 MG PO CHEW
81.0000 mg | CHEWABLE_TABLET | ORAL | Status: AC
  Administered 2023-02-17: 81 mg via ORAL
  Filled 2023-02-15: qty 1

## 2023-02-15 MED ORDER — FUROSEMIDE 10 MG/ML IJ SOLN
40.0000 mg | Freq: Two times a day (BID) | INTRAMUSCULAR | Status: DC
  Administered 2023-02-15 – 2023-02-16 (×4): 40 mg via INTRAVENOUS
  Filled 2023-02-15 (×5): qty 4

## 2023-02-15 MED ORDER — SODIUM CHLORIDE 0.9 % IV SOLN: INTRAVENOUS | Status: AC

## 2023-02-15 MED ORDER — SODIUM CHLORIDE 0.9 % IV SOLN: INTRAVENOUS | Status: DC

## 2023-02-15 NOTE — Consult Note (Signed)
PHARMACY CONSULT NOTE - ELECTROLYTES  Pharmacy Consult for Electrolyte Monitoring and Replacement   Recent Labs: Potassium (mmol/L)  Date Value  02/15/2023 4.7   Magnesium (mg/dL)  Date Value  14/78/2956 2.4   Calcium (mg/dL)  Date Value  21/30/8657 8.1 (L)   Albumin (g/dL)  Date Value  84/69/6295 2.6 (L)   Phosphorus (mg/dL)  Date Value  28/41/3244 3.5   Sodium (mmol/L)  Date Value  02/15/2023 138  10/26/2017 142   Height: 5' 2.01" (157.5 cm) Weight: 128.5 kg (283 lb 4.7 oz) IBW/kg (Calculated) : 50.12 Estimated Creatinine Clearance: 46.2 mL/min (A) (by C-G formula based on SCr of 1.25 mg/dL (H)).  Assessment  [name] is a 82 y.o. female presenting with respiratory distress / PEA arrest / septic shock requiring vasopressors. PMH significant for hypertension, diabetes, CHF, aortic stenosis status post TAVR, and nonalcoholic fatty liver disease . Pharmacy has been consulted to monitor and replace electrolytes.  Goal of Therapy:  Potassium 4.0 - 5.1 mmol/L Magnesium 2.0 - 2.4 mg/dL All Other Electrolytes WNL  Plan:  --NO replacement needed at this time. --Electrolytes tomorrow AM  Thank you for allowing pharmacy to be a part of this patient's care.  Matraca Hunkins Rodriguez-Guzman PharmD, BCPS 02/15/2023 8:05 AM

## 2023-02-15 NOTE — Progress Notes (Signed)
Report called to [facility]; pt to transfer out to 2 A next shift.

## 2023-02-15 NOTE — Consult Note (Signed)
PHARMACY CONSULT NOTE - ELECTROLYTES  Pharmacy Consult for Electrolyte Monitoring and Replacement   Recent Labs: Potassium (mmol/L)  Date Value  02/15/2023 4.7   Magnesium (mg/dL)  Date Value  75/64/3329 2.4   Calcium (mg/dL)  Date Value  51/88/4166 8.1 (L)   Albumin (g/dL)  Date Value  08/22/1599 2.6 (L)   Phosphorus (mg/dL)  Date Value  09/32/3557 3.5   Sodium (mmol/L)  Date Value  02/15/2023 138  10/26/2017 142   Height: 5' 2.01" (157.5 cm) Weight: 128.5 kg (283 lb 4.7 oz) IBW/kg (Calculated) : 50.12 Estimated Creatinine Clearance: 46.2 mL/min (A) (by C-G formula based on SCr of 1.25 mg/dL (H)).  Assessment  [name] is a 82 y.o. female presenting with respiratory distress / PEA arrest / septic shock requiring vasopressors. PMH significant for hypertension, diabetes, CHF, aortic stenosis status post TAVR, and nonalcoholic fatty liver disease . Pharmacy has been consulted to monitor and replace electrolytes.  Goal of Therapy:  Potassium 4.0 - 5.1 mmol/L Magnesium 2.0 - 2.4 mg/dL All Other Electrolytes WNL  Plan:  --no electrolyte replacement warranted for today --Electrolytes tomorrow AM  Thank you for allowing pharmacy to be a part of this patient's care.  [name] 02/15/2023 7:15 AM

## 2023-02-15 NOTE — Progress Notes (Signed)
Progress Note   Patient: [name] VWU:[id number] DOB: 1940/03/12 DOA: 02/11/2023     4 DOS: the patient was seen and examined on 02/15/2023   Brief hospital course: "82 year old female with a history of Essential hypertension, heart failure with preserved LVEF, Severe AS s/p TAVR, hypertension, diabetes type 2, diabetic ulcers, fatty liver disease, GERD, chronic dysphagia, history of colon polyps, was admitted and discharged earlier in dec 2024 with b/l LE cellulits and left toe infected ulcer with underlying fracture of phalanx and osteomyelitis. She apparently was on the way to hospital for routine appt and found to be hypoxemic in field. She required intubation for severe hypoxemia in ED. Patient seen in ER with circulatory shock on levophed while intubated..."  Pt required CPR in the ED and was initially admitted to ICU on the ventilator.  See PCCM notes from admission through 12/22. TRH assumed care 02/14/23.  Further hospital course and management as outlined below.   Assessment and Plan:  Cardiopulmonary arrest s/p CPR and ROSC Acute respiratory failure with hypoxia - liberated from ventilator 12/21 Presumed septic shock - POA, shock physiology is resolved. UTI - E coli Elevated troponin - NSTEMI not excluded Acute on Chronic HFrEF --Cardiology is following --Plan for R/L cath this admission - timing per cardiology --Resumed on IV Lasix this AM --Completed 48 hours IV heparin --Echo EF 30-35% (prior EF 45% in Aug 2024 at [facility]) --Additional GDMT per Cardiology as BP and renal function permit --Strict Io's and daily weights --Monitor renal function, electrolytes --Supplement O2, maintain sats > 90%, wean as tolerated --Pulmonary hygiene --On PO doxy and ceftin for UTI, cellulitis --Follow BAL and blood cultures  Bilateral lower extremity cellulitis 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 --Sliding scale Novolog.   Stage II CKD  --Monitor renal function closely with diuresis --Avoid nephrotoxins and hypotension --Renally dose meds  Hyperlipemia --Lipitor 40 mg   Hypertension - presented in shock BP's are controlled --Holding antihypertensives  Fibromyalgia No acute issues.  Morbid obesity (HCC) Body mass index is 48.02 kg/m. Complicates overall care and prognosis.  Recommend lifestyle modifications including physical activity and diet for weight loss and overall long-term health.   NAFLD (nonalcoholic fatty liver disease) No acute issues.        Subjective: Pt seen in ICU this AM, awake sitting up in bed. She reports ongoing chest and back pain from CPR.  Otherwise denies complaints. Reports high urine output.   Physical Exam: Vitals:   02/15/23 0800 02/15/23 0900 02/15/23 1000 02/15/23 1100  BP: (!) 119/46 (!) 137/105 (!) 100/50 (!) 116/94  Pulse: 66 83 73 76  Resp: 19 (!) 21 20 (!) 22  Temp:      TempSrc:      SpO2: 100% 100% 100% 100%  Weight:      Height:       General exam: awake, alert, no acute distress HEENT: moist mucus membranes, hearing grossly normal  Respiratory system: CTAB with diminished bases bilaterally, no wheezes, rales or rhonchi, normal respiratory effort. Cardiovascular system: normal S1/S2, RRR, Left internal jugular central line in place  Gastrointestinal system: soft-non-tender, non-distended Central nervous system: A&O x 3. no gross focal neurologic deficits, normal speech Extremities: distal BLE's wrapped, BLE erythema without differential warmth, improved BLE edema with skin wrinkling noted Psychiatry: normal mood,  congruent affect, judgement and insight appear normal   Data Reviewed:  Notable labs ---   glucose 133, Cr 1.37>>1.25, Ca 8.1, Hbg stable 9.0 Platelets 139k  Family  Communication: None present, will attempt to call  Disposition: Status is: Inpatient Remains inpatient appropriate because: ongoing evaluation, IV therapies, needs clearance by consultants for d/c.   Needs SNF placement   Planned Discharge Destination: Skilled nursing facility vs [facility]    Time spent: 38 minutes  Author: Pennie Banter, DO 02/15/2023 12:53 PM  For on call review [email or web address].

## 2023-02-15 NOTE — Progress Notes (Signed)
Speech Language Pathology Treatment: Dysphagia  Patient Details Name: [name] MRN: [id number] DOB: 06/19/1940 Today's Date: 02/15/2023 Time: 2841-3244 SLP Time Calculation (min) (ACUTE ONLY): 40 min  Assessment / Plan / Recommendation Clinical Impression  Pt seen for ongoing assessment of swallowing. She was alert, verbally responsive and engaged in conversation w/ SLP; min distracted and talkative at times but redirected immediately.  Pt is on [place] O2 support 4L; wbc wnl. Afebrile. Missing Dentition at baseline.   OF NOTE: Pt has a baseline of GERD, sliding Hiatal Hernia. See prior EGD and DG Esophagus. ANY Esophageal phase Dysmotility or Regurgitation of Reflux material can increase risk for aspiration of the Reflux material during Retrograde flow thus impact Voicing and Pulmonary status.   Pt explained general aspiration precautions and agreed verbally to the need for following them especially sitting upright for all oral intake -- supported behind the back for more upright sitting(pt initially said she was "fine" when lying more reclined). Pt assisted w/ positioning d/t min weakness. She also endorsed need to use Small sips and bites during meals and to chew foods "well" -- she is Missing many Dentition. She continued to feed herself meal items of soft solids and thin liquids via cup/straw. NO overt clinical s/s of aspiration were noted w/ any consistency; respiratory status remained calm and unlabored, vocal quality clear b/t trials. No cough. O2 sats were 99%. Oral phase appeared [facility] for bolus management and timely A-P transfer for swallowing; oral clearing achieved w/ all consistencies. NSG denied any deficits in swallowing today as well.   Pt appears at reduced risk for aspiration/aspiration pneumonia when following general aspiration precautions and w/ good Upright positioning w/ all oral intake. She benefits from setup and support at meals d/t weakness.  Recommend continue a more Mech  soft diet for ease of soft foods and chewing (d/t Missing Dentition) w/ gravies added to moisten foods; Thin liquids. Recommend general aspiration precautions; less distractions/Talking during meals. Pills Whole vs need to Crush in Puree; tray setup and positioning assistance for meals. REFLUX/GERD precautions d/t pt's baseline.  ST services will sign off at this time as pt appears at/near her Baseline w/ MD to reconsult if needed while admitted. NSG updated. Precautions posted at bedside.      HPI HPI: Per H&P "82 year old female with a history of Obesity, Essential hypertension, heart failure with preserved LVEF, EF 30-35%, Severe AS s/p TAVR, hypertension, diabetes type 2, diabetic ulcers, fatty liver disease, GERD, chronic dysphagia, history of colon polyps, was admitted and discharged earlier in dec 2024 with b/l LE cellulits and left toe infected ulcer with underlying fracture of phalanx and osteomyelitis. She apparently was on the way to hospital for routine appt and found to be hypoxemic in field".  Pt required intubation for severe hypoxemia & developed circulatory shock. Pt was extubated 02/12/23 (~1 day of intubation).  CT of Chest at admit: Interlobular septal thickening in the upper lobes bilaterally  with bilateral asymmetric central predominant ground-glass opacity.  Imaging features are compatible with pulmonary edema.  3. Collapse/consolidative disease in the lower lungs bilaterally is  associated with small bilateral pleural effusions. Lower lobe  disease compatible with atelectasis and/or pneumonia.  4. Bilateral pulmonary nodules noted, unchanged since remote prior  where visible although portions of both lungs have been obscured by  airspace disease. Consider follow-up CT chest without contrast after  resolution of acute symptoms for more definitive evaluation of  pulmonary nodules.  5. Cirrhosis.   OF NOTE:  DG Esophagus in 2003 revealed Hiatal Hernia and REFLUX then.  EGD in 2022 revealed  gastritis then.      SLP Plan  All goals met      Recommendations for follow up therapy are one component of a multi-disciplinary discharge planning process, led by the attending physician.  Recommendations may be updated based on patient status, additional functional criteria and insurance authorization.    Recommendations  Diet recommendations: Dysphagia 3 (mechanical soft);Thin liquid (d/t missing Dentition; added gravies to moisten) Liquids provided via: Cup;Straw (monitor) Medication Administration: Whole meds with puree (vs need to Crush in Puree) Supervision: Patient able to self feed;Intermittent supervision to cue for compensatory strategies (setup) Compensations: Minimize environmental distractions;Slow rate;Small sips/bites;Lingual sweep for clearance of pocketing;Follow solids with liquid Postural Changes and/or Swallow Maneuvers: Out of bed for meals;Seated upright 90 degrees;Upright 30-60 min after meal                 (Dietician f/u) Oral care BID;Oral care before and after PO;Staff/trained caregiver to provide oral care (support)   Intermittent Supervision/Assistance Dysphagia, unspecified (R13.10)     All goals met      Jerilynn Som, MS, CCC-SLP Speech Language Pathologist Rehab Services; [facility] [phone] (ascom) Eulalah Rupert  02/15/2023, 1:16 PM

## 2023-02-15 NOTE — TOC Progression Note (Signed)
Transition of Care [facility]) - Progression Note    Patient Details  Name: [name] MRN: [id number] Date of Birth: 01-11-1941  [facility] CM/SW Contact  Margarito Liner, LCSW Phone Number: 02/15/2023, 11:43 AM  Clinical Narrative:   CSW provided SNF bed offers. Patient will review with her son.  Expected Discharge Plan: Skilled Nursing Facility Barriers to Discharge: Continued Medical Work up  Expected Discharge Plan and Services     Post Acute Care Choice: Skilled Nursing Facility Living arrangements for the past 2 months: Single Family Home                                       Social Determinants of Health (SDOH) Interventions SDOH Screenings   Food Insecurity: Patient Unable To Answer (02/11/2023)  Housing: Patient Unable To Answer (02/11/2023)  Utilities: Patient Unable To Answer (02/11/2023)  Financial Resource Strain: Low Risk  (10/22/2022)   Received from [facility]  Social Connections: Socially Isolated (10/22/2022)   Received from [facility]  Stress: No Stress Concern Present (10/22/2022)   [facility]  Tobacco Use: Medium Risk (02/11/2023)  Health Literacy: Low Risk  (10/22/2022)   Received from [facility]    Readmission Risk Interventions     No data to display

## 2023-02-15 NOTE — Progress Notes (Addendum)
Cardiology Progress Note   Patient Name: [name] Date of Encounter: 02/15/2023  Primary Cardiologist: Tonny Bollman, MD  Subjective   Short of breath just speaking this AM. Has also been orthopneic - keeping HOB up ~ 30 degrees.  Becomes dyspneic and restless when lying flat.  Denies chest pain. Currently scheduled for R & L heart cath. Objective   Inpatient Medications    Scheduled Meds:  [START ON 02/16/2023] aspirin  81 mg Oral Pre-Cath   atorvastatin  40 mg Oral Daily   cefUROXime  500 mg Oral BID WC   Chlorhexidine Gluconate Cloth  6 each Topical Daily   doxycycline  100 mg Oral Q12H   hydrocerin   Topical Daily   insulin aspart  0-15 Units Subcutaneous Q4H   mupirocin ointment   Topical BID   Continuous Infusions:  [START ON 02/16/2023] sodium chloride     PRN Meds: ALPRAZolam, fentaNYL (SUBLIMAZE) injection, ipratropium-albuterol, morphine injection, ondansetron (ZOFRAN) IV, mouth rinse, polyethylene glycol, prochlorperazine   Vital Signs    Vitals:   02/15/23 0300 02/15/23 0400 02/15/23 0500 02/15/23 0515  BP: 109/79   109/75  Pulse: 70     Resp: 18     Temp:  98.1 F (36.7 C)    TempSrc:  Oral    SpO2: 97%     Weight:   128.5 kg   Height:        Intake/Output Summary (Last 24 hours) at 02/15/2023 0855 Last data filed at 02/15/2023 0600 Gross per 24 hour  Intake 50 ml  Output 750 ml  Net -700 ml   Filed Weights   02/11/23 1200 02/11/23 1700 02/15/23 0500  Weight: 125.4 kg 131.6 kg 128.5 kg    Physical Exam   GEN: Obese, in no acute distress.  HEENT: Grossly normal.  Neck: Supple, obese, difficult to gauge JVP.  No carotid bruits, or masses. Cardiac: RRR, 2/6 SEM @ the upper sternal borders.  No rubs or gallops. No clubbing, cyanosis, edema.  Radials 2+, DP/PT 2+ and equal bilaterally.  Respiratory:  Respirations regular and labored when speaking.  Diminished breath sounds throughout w/ insp/exp wheezing and scattered rhonci. GI:  Obese, soft, nontender, nondistended, BS + x 4. MS: no deformity or atrophy. Skin: warm and dry, no rash. Neuro:  Strength and sensation are intact. Psych: AAOx3.  Normal affect.  Labs    Chemistry Recent Labs  Lab 02/11/23 1134 02/11/23 1931 02/13/23 0351 02/14/23 0239 02/15/23 0512  NA 139   < > 139 140 138  K 2.9*   < > 3.6 3.9 4.7  CL 111   < > 104 105 106  CO2 21*   < > 25 27 26   GLUCOSE 270*   < > 195* 142* 133*  BUN 21   < > 28* 23 24*  CREATININE 1.11*   < > 1.53* 1.37* 1.25*  CALCIUM 6.1*   < > 7.7* 7.8* 8.1*  PROT 5.2*  --   --   --   --   ALBUMIN 2.2*   < > 2.6* 2.5* 2.6*  AST 33  --   --   --   --   ALT 16  --   --   --   --   ALKPHOS 65  --   --   --   --   BILITOT 0.5  --   --   --   --   GFRNONAA 50*   < > 34* 39*  44*  ANIONGAP 7   < > 10 8 6    < > = values in this interval not displayed.     Hematology Recent Labs  Lab 02/13/23 0351 02/14/23 0239 02/15/23 0513  WBC 12.1* 8.2 7.6  RBC 3.43* 3.23* 3.16*  HGB 9.8* 9.2* 9.0*  HCT 31.3* 29.9* 30.1*  MCV 91.3 92.6 95.3  MCH 28.6 28.5 28.5  MCHC 31.3 30.8 29.9*  RDW 15.6* 15.4 15.3  PLT 152 126* 139*    Cardiac Enzymes  Recent Labs  Lab 02/11/23 2219 02/12/23 0008 02/12/23 0224 02/12/23 1706 02/12/23 1848  TROPONINIHS 1,153* 1,078* 1,028* 508* 515*      BNP    Component Value Date/Time   BNP 623.5 (H) 02/12/2023 0405   Lipids  Lab Results  Component Value Date   CHOL 146 01/23/2022   HDL 36 (L) 01/23/2022   LDLCALC 91 01/23/2022   TRIG 120 02/12/2023   CHOLHDL 4.1 01/23/2022    HbA1c  Lab Results  Component Value Date   HGBA1C 8.8 (H) 02/11/2023    Radiology    DG Chest Port 1 View Result Date: 02/11/2023 CLINICAL DATA:  Central line EXAM: PORTABLE CHEST 1 VIEW COMPARISON:  02/11/2023, CT 02/11/2023 FINDINGS: Endotracheal tube tip is about 2.8 cm superior to the carina. Esophageal tube tip below the diaphragm but incompletely visualized. Left IJ central venous catheter  tip over the SVC confluence. Cardiomegaly with vascular congestion. Patchy left greater than right basilar airspace disease. No pneumothorax IMPRESSION: 1. Left IJ central venous catheter tip over the SVC confluence. No pneumothorax. 2. Cardiomegaly with vascular congestion. Patchy left greater than right basilar airspace disease, atelectasis versus pneumonia. This appears worse compared to prior Electronically Signed   By: Jasmine Pang M.D.   On: 02/11/2023 21:00   CT Angio Chest PE W/Cm &/Or Wo Cm Result Date: 02/11/2023 CLINICAL DATA:  Respiratory distress. Status post arrest. Status post CPR. Clinical concern for pulmonary embolus. EXAM: CT ANGIOGRAPHY CHEST WITH CONTRAST TECHNIQUE: Multidetector CT imaging of the chest was performed using the standard protocol during bolus administration of intravenous contrast. Multiplanar CT image reconstructions and MIPs were obtained to evaluate the vascular anatomy. RADIATION DOSE REDUCTION: This exam was performed according to the departmental dose-optimization program which includes automated exposure control, adjustment of the mA and/or kV according to patient size and/or use of iterative reconstruction technique. CONTRAST:  75mL OMNIPAQUE IOHEXOL 350 MG/ML SOLN COMPARISON:  11/14/2017 FINDINGS: Cardiovascular: Heart is enlarged. No substantial pericardial effusion. Status post TAVR. Mild atherosclerotic calcification is noted in the wall of the thoracic aorta. No large central pulmonary embolus. No evidence for lobar or segmental pulmonary embolus. Subsegmental pulmonary arteries to both lower lobes are not reliably evaluated due to bolus timing and motion artifact. Mediastinum/Nodes: 3.5 cm right thyroid nodule incompletely visualized. This has been evaluated on previous imaging. (Ref: J Am Coll Radiol. 2015 Feb;12(2): 143-50).No mediastinal lymphadenopathy. There is no hilar lymphadenopathy. The esophagus has normal imaging features. There is no axillary  lymphadenopathy. Lungs/Pleura: Interlobular septal thickening noted in the upper lobes bilaterally with bilateral asymmetric central predominant ground-glass opacity. 5 mm right upper lobe nodule is stable on 43/7. 5 mm right lung nodule on 70/7 is in a region of collapse/consolidative disease. 5 mm right lung nodule on 67/7 is not substantially changed in the interval consistent with benign etiology. Tiny nodules again noted both lower lobes. Collapse/consolidative disease in the lower lungs bilaterally is associated with small bilateral pleural effusions. Upper Abdomen: Nodularity of  the liver contour is compatible with cirrhosis. NG tube tip is positioned in the mid stomach. Musculoskeletal: No worrisome lytic or sclerotic osseous abnormality. Review of the MIP images confirms the above findings. IMPRESSION: 1. No large central pulmonary embolus. No evidence for lobar or segmental pulmonary embolus. Subsegmental pulmonary arteries to both lower lobes are not reliably evaluated due to bolus timing and motion artifact. 2. Interlobular septal thickening in the upper lobes bilaterally with bilateral asymmetric central predominant ground-glass opacity. Imaging features are compatible with pulmonary edema. 3. Collapse/consolidative disease in the lower lungs bilaterally is associated with small bilateral pleural effusions. Lower lobe disease compatible with atelectasis and/or pneumonia. 4. Bilateral pulmonary nodules noted, unchanged since remote prior where visible although portions of both lungs have been obscured by airspace disease. Consider follow-up CT chest without contrast after resolution of acute symptoms for more definitive evaluation of pulmonary nodules. 5. Cirrhosis. 6.  Aortic Atherosclerosis (ICD10-I70.0). Electronically Signed   By: Kennith Center M.D.   On: 02/11/2023 15:13   CT Head Wo Contrast Result Date: 02/11/2023 CLINICAL DATA:  Mental status change, unknown cause EXAM: CT HEAD WITHOUT  CONTRAST TECHNIQUE: Contiguous axial images were obtained from the base of the skull through the vertex without intravenous contrast. RADIATION DOSE REDUCTION: This exam was performed according to the departmental dose-optimization program which includes automated exposure control, adjustment of the mA and/or kV according to patient size and/or use of iterative reconstruction technique. COMPARISON:  None Available. FINDINGS: Brain: No evidence of acute infarction, hemorrhage, hydrocephalus, extra-axial collection or mass lesion/mass effect. Vascular: No hyperdense vessel. Skull: No acute fracture. Sinuses/Orbits: Mild paranasal sinus mucosal thickening. No acute orbital findings. Other: No mastoid effusions. IMPRESSION: No evidence of acute intracranial abnormality. An MRI could provide more sensitive evaluation for hypoxic/ischemic injury if clinically warranted Electronically Signed   By: Feliberto Harts M.D.   On: 02/11/2023 14:12   DG Abdomen 1 View Result Date: 02/11/2023 CLINICAL DATA:  Enteric catheter placement EXAM: ABDOMEN - 1 VIEW COMPARISON:  08/04/2007 FINDINGS: Supine frontal view of the abdomen and pelvis was obtained, excluding the hemidiaphragms, left flank, lower pelvis by collimation. Enteric catheter passes below diaphragm, tip and side port projecting over the gastric fundus. Bowel gas pattern is unremarkable without obstruction or ileus. There is an oval lucency projecting over the right iliac bone, of uncertain etiology but likely external to the patient. No masses or abnormal calcifications. No acute fracture. IMPRESSION: 1. Enteric catheter tip projecting over the gastric fundus. Electronically Signed   By: Sharlet Salina M.D.   On: 02/11/2023 11:25   DG Chest Portable 1 View Result Date: 02/11/2023 CLINICAL DATA:  Intubated EXAM: PORTABLE CHEST 1 VIEW COMPARISON:  12/06/2017 FINDINGS: Single frontal view of the chest demonstrates endotracheal tube overlying tracheal air column,  tip approximately 2.5 cm above carina. Enteric catheter passes below diaphragm tip excluded by collimation. External defibrillator pads overlie the chest. Cardiac silhouette is enlarged. There is increased pulmonary vascular congestion without airspace disease, effusion, or pneumothorax. No acute bony abnormalities. IMPRESSION: 1. Support devices as above. 2. Pulmonary vascular congestion without overt edema. Electronically Signed   By: Sharlet Salina M.D.   On: 02/11/2023 11:23     Telemetry    RSR, 1st deg AVB - Personally Reviewed  Cardiac Studies   2D Echocardiogram 12.21.2024  1. Left ventricular ejection fraction, by estimation, is 30 to 35%. The  left ventricle has moderately decreased function. The left ventricle  demonstrates regional wall motion abnormalities (see scoring  diagram/findings for description) and global  hypokinesis. There is mild concentric left ventricular hypertrophy. Left  ventricular diastolic parameters are consistent with Grade II diastolic  dysfunction (pseudonormalization). Elevated left ventricular end-diastolic  pressure. There is akinesis of the   left ventricular, apical septal wall.   2. Right ventricular systolic function is normal. The right ventricular  size is normal.   3. Left atrial size was severely dilated.   4. The mitral valve is degenerative. No evidence of mitral valve  regurgitation. Mild mitral stenosis. The mean mitral valve gradient is 4.0  mmHg. Severe mitral annular calcification.   5. The aortic valve has been repaired/replaced. Perivalvular Aortic valve  regurgitation is mild. 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  There is a 26 mm CoreValve-Evolut Pro prosthetic (TAVR) valve present in  the aortic position. Aortic  regurgitation PHT measures 557 msec. Aortic valve area, by VTI measures  0.93 cm. Aortic valve mean gradient measures 15.0 mmHg. Aortic valve Vmax  measures 2.70 m/s. DVI 0.37.   6. The inferior vena  cava is normal in size with greater than 50%  respiratory variability, suggesting right atrial pressure of 3 mmHg.   7. Compared to echo dated 01/28/2021, LVF has declined but apical  inferoseptal wall motion abnormality is unchanged. The mean TAVR gradient  has increased from to and DVI has decreased from 0.92 to  0.37.    Patient Profile     82 y.o. female  with a hx of nonobstructive CAD by LHC in 2019, severe aortic stenosis status post TAVR in 11/2017, HFpEF, fibromyalgia, DM2 HTN, HLD, and obesity, who was admitted 12/20 due to resp failure, PEA arrest, septic shock, PNA, and bilat lower ext cellulits, complicated by NSTEMI w/ peak hsTrop of 1153, and new finding of LV dysfxn w/ EF of 30-35% by echo.   Assessment & Plan    1.  Acute respiratory failure/Sepsis:  Admitted w/ resp failure req intubation, PEA arrest req, and septic shock.  Tx for bilat lower ext cellulitis and UTI.  Extubated 12/21.  Remains on O2 via [place] w/ diminished breath sounds, wheezing, scattered rhonchi.  Abx per IM.    2.  PEA arrest/hypotension:  In setting of above.  Off pressors and hemodynamically stable, though pressures intermittently soft over past 24 hrs.  Follow.  3.  Acute HFrEF:  in setting of above, Echo w/ new LV dysfxn  EF 30-35% w/ glob HK.  Remains on supplemental O2 w/ dyspnea and orthopnea.  Volume status difficult to gauge due to body habitus.  CVP transduced ~ 18.  Diurese today.  Defer cath until 12/26.  GDMT limited by intermittent hypotension.  4.  NSTEMI vs Demand Ischemia:  Peak hsTrop up to 1153 in setting of above.  Has chest wall tenderness in setting of CPR prior to admission but no recent anginal symptoms.  Plan for R & L heart Cath - defer to 12/26 in setting of dyspnea and volume overload.  5.  AKI:  creat up to 1.53 on 12/22.  Improved @ 1.25 this AM.  Follow w/ diuresis.  6.  HL:  cont statin.  7.  Normocytic anemia:  stable.  8.  Aortic stenosis s/p TAVR (2019):   Echo this admission w/ mild peri-valvular leak and mild-mod AS.  Not likely contributing to current presentation.  Signed, Nicolasa Ducking, NP  02/15/2023, 8:55 AM    For questions or updates, please contact   Please consult [email or web address] for contact info under  Cardiology/STEMI.

## 2023-02-15 NOTE — Plan of Care (Signed)
  Problem: Education: Goal: Knowledge of General Education information will improve Description: Including pain rating scale, medication(s)/side effects and non-pharmacologic comfort measures Outcome: Progressing   Problem: Health Behavior/Discharge Planning: Goal: Ability to manage health-related needs will improve Outcome: Progressing   Problem: Clinical Measurements: Goal: Ability to maintain clinical measurements within normal limits will improve Outcome: Progressing Goal: Will remain free from infection Outcome: Progressing Goal: Diagnostic test results will improve Outcome: Progressing Goal: Respiratory complications will improve Outcome: Progressing Goal: Cardiovascular complication will be avoided Outcome: Progressing   Problem: Activity: Goal: Risk for activity intolerance will decrease Outcome: Progressing   Problem: Nutrition: Goal: Adequate nutrition will be maintained Outcome: Progressing   Problem: Coping: Goal: Level of anxiety will decrease Outcome: Progressing   Problem: Elimination: Goal: Will not experience complications related to bowel motility Outcome: Progressing Goal: Will not experience complications related to urinary retention Outcome: Progressing   Problem: Pain Management: Goal: General experience of comfort will improve Outcome: Progressing   Problem: Safety: Goal: Ability to remain free from injury will improve Outcome: Progressing   Problem: Skin Integrity: Goal: Risk for impaired skin integrity will decrease Outcome: Progressing   Problem: Activity: Goal: Ability to tolerate increased activity will improve Outcome: Progressing   Problem: Respiratory: Goal: Ability to maintain a clear airway and adequate ventilation will improve Outcome: Progressing   Problem: Role Relationship: Goal: Method of communication will improve Outcome: Progressing   Problem: Education: Goal: Ability to describe self-care measures that may  prevent or decrease complications (Diabetes Survival Skills Education) will improve Outcome: Progressing Goal: Individualized Educational Video(s) Outcome: Progressing   Problem: Coping: Goal: Ability to adjust to condition or change in health will improve Outcome: Progressing   Problem: Fluid Volume: Goal: Ability to maintain a balanced intake and output will improve Outcome: Progressing   Problem: Health Behavior/Discharge Planning: Goal: Ability to identify and utilize available resources and services will improve Outcome: Progressing Goal: Ability to manage health-related needs will improve Outcome: Progressing   Problem: Metabolic: Goal: Ability to maintain appropriate glucose levels will improve Outcome: Progressing   Problem: Nutritional: Goal: Maintenance of adequate nutrition will improve Outcome: Progressing Goal: Progress toward achieving an optimal weight will improve Outcome: Progressing   Problem: Skin Integrity: Goal: Risk for impaired skin integrity will decrease Outcome: Progressing   Problem: Tissue Perfusion: Goal: Adequacy of tissue perfusion will improve Outcome: Progressing

## 2023-02-16 DIAGNOSIS — R579 Shock, unspecified: Secondary | ICD-10-CM | POA: Diagnosis not present

## 2023-02-16 DIAGNOSIS — I469 Cardiac arrest, cause unspecified: Secondary | ICD-10-CM | POA: Diagnosis not present

## 2023-02-16 DIAGNOSIS — I5021 Acute systolic (congestive) heart failure: Secondary | ICD-10-CM | POA: Diagnosis not present

## 2023-02-16 LAB — CULTURE, BLOOD (ROUTINE X 2)
Culture: NO GROWTH
Culture: NO GROWTH
Culture: NO GROWTH

## 2023-02-16 LAB — GLUCOSE, CAPILLARY
Glucose-Capillary: 163 mg/dL — ABNORMAL HIGH (ref 70–99)
Glucose-Capillary: 114 mg/dL — ABNORMAL HIGH (ref 70–99)
Glucose-Capillary: 108 mg/dL — ABNORMAL HIGH (ref 70–99)
Glucose-Capillary: 148 mg/dL — ABNORMAL HIGH (ref 70–99)
Glucose-Capillary: 179 mg/dL — ABNORMAL HIGH (ref 70–99)
Glucose-Capillary: 164 mg/dL — ABNORMAL HIGH (ref 70–99)

## 2023-02-16 LAB — CBC
HCT: 31.2 % — ABNORMAL LOW (ref 36.0–46.0)
Hemoglobin: 9.5 g/dL — ABNORMAL LOW (ref 12.0–15.0)
MCH: 28.1 pg (ref 26.0–34.0)
MCHC: 30.4 g/dL (ref 30.0–36.0)
MCV: 92.3 fL (ref 80.0–100.0)
Platelets: 150 10*3/uL (ref 150–400)
RBC: 3.38 MIL/uL — ABNORMAL LOW (ref 3.87–5.11)
RDW: 15.5 % (ref 11.5–15.5)
WBC: 8.2 10*3/uL (ref 4.0–10.5)
nRBC: 0 % (ref 0.0–0.2)

## 2023-02-16 LAB — RENAL FUNCTION PANEL
Albumin: 2.7 g/dL — ABNORMAL LOW (ref 3.5–5.0)
Anion gap: 7 (ref 5–15)
BUN: 25 mg/dL — ABNORMAL HIGH (ref 8–23)
CO2: 31 mmol/L (ref 22–32)
Calcium: 8.6 mg/dL — ABNORMAL LOW (ref 8.9–10.3)
Chloride: 106 mmol/L (ref 98–111)
Creatinine, Ser: 1.13 mg/dL — ABNORMAL HIGH (ref 0.44–1.00)
GFR, Estimated: 49 mL/min — ABNORMAL LOW (ref >60)
Glucose, Bld: 137 mg/dL — ABNORMAL HIGH (ref 70–99)
Phosphorus: 3.4 mg/dL (ref 2.5–4.6)
Potassium: 4.7 mmol/L (ref 3.5–5.1)
Sodium: 144 mmol/L (ref 135–145)

## 2023-02-16 LAB — MAGNESIUM: Magnesium: 2.1 mg/dL (ref 1.7–2.4)

## 2023-02-16 MED ORDER — KETOROLAC TROMETHAMINE 15 MG/ML IJ SOLN
15.0000 mg | Freq: Four times a day (QID) | INTRAMUSCULAR | Status: DC | PRN
Start: 2023-02-16 — End: 2023-02-19
  Administered 2023-02-16 – 2023-02-18 (×2): 15 mg via INTRAVENOUS
  Filled 2023-02-16 (×2): qty 1

## 2023-02-16 NOTE — Plan of Care (Signed)
  Problem: Education: Goal: Knowledge of General Education information will improve Description: Including pain rating scale, medication(s)/side effects and non-pharmacologic comfort measures Outcome: Progressing   Problem: Health Behavior/Discharge Planning: Goal: Ability to manage health-related needs will improve Outcome: Progressing   Problem: Clinical Measurements: Goal: Ability to maintain clinical measurements within normal limits will improve Outcome: Progressing Goal: Will remain free from infection Outcome: Progressing Goal: Diagnostic test results will improve Outcome: Progressing Goal: Respiratory complications will improve Outcome: Progressing Goal: Cardiovascular complication will be avoided Outcome: Progressing   Problem: Activity: Goal: Risk for activity intolerance will decrease Outcome: Progressing   Problem: Nutrition: Goal: Adequate nutrition will be maintained Outcome: Progressing   Problem: Coping: Goal: Level of anxiety will decrease Outcome: Progressing   Problem: Elimination: Goal: Will not experience complications related to bowel motility Outcome: Progressing Goal: Will not experience complications related to urinary retention Outcome: Progressing   Problem: Pain Management: Goal: General experience of comfort will improve Outcome: Progressing   Problem: Safety: Goal: Ability to remain free from injury will improve Outcome: Progressing   Problem: Skin Integrity: Goal: Risk for impaired skin integrity will decrease Outcome: Progressing   Problem: Activity: Goal: Ability to tolerate increased activity will improve Outcome: Progressing   Problem: Respiratory: Goal: Ability to maintain a clear airway and adequate ventilation will improve Outcome: Progressing   Problem: Role Relationship: Goal: Method of communication will improve Outcome: Progressing   Problem: Education: Goal: Ability to describe self-care measures that may  prevent or decrease complications (Diabetes Survival Skills Education) will improve Outcome: Progressing Goal: Individualized Educational Video(s) Outcome: Progressing   Problem: Coping: Goal: Ability to adjust to condition or change in health will improve Outcome: Progressing   Problem: Fluid Volume: Goal: Ability to maintain a balanced intake and output will improve Outcome: Progressing   Problem: Health Behavior/Discharge Planning: Goal: Ability to identify and utilize available resources and services will improve Outcome: Progressing Goal: Ability to manage health-related needs will improve Outcome: Progressing   Problem: Metabolic: Goal: Ability to maintain appropriate glucose levels will improve Outcome: Progressing   Problem: Nutritional: Goal: Maintenance of adequate nutrition will improve Outcome: Progressing Goal: Progress toward achieving an optimal weight will improve Outcome: Progressing   Problem: Skin Integrity: Goal: Risk for impaired skin integrity will decrease Outcome: Progressing   Problem: Tissue Perfusion: Goal: Adequacy of tissue perfusion will improve Outcome: Progressing   Problem: Education: Goal: Understanding of CV disease, CV risk reduction, and recovery process will improve Outcome: Progressing Goal: Individualized Educational Video(s) Outcome: Progressing   Problem: Activity: Goal: Ability to return to baseline activity level will improve Outcome: Progressing   Problem: Cardiovascular: Goal: Ability to achieve and maintain adequate cardiovascular perfusion will improve Outcome: Progressing Goal: Vascular access site(s) Level 0-1 will be maintained Outcome: Progressing   Problem: Health Behavior/Discharge Planning: Goal: Ability to safely manage health-related needs after discharge will improve Outcome: Progressing

## 2023-02-16 NOTE — Progress Notes (Signed)
Cardiology Progress Note   Patient Name: [name] Date of Encounter: 02/16/2023  Primary Cardiologist: Tonny Bollman, MD  Subjective   Cardia cath postponed on 12/24 due to worsening respiratory status, diuresed with IV Lasix 40 mg bid. Documented UOP 2.7 L for the past 24 hours, net - 4.1 L for the admission. Renal function stable. Dyspnea a little improved. Chest remains sore following CPR, unchanged from earlier in the admission. Remains on supplemental oxygen via nasal cannula at 4 L.   Objective   Inpatient Medications    Scheduled Meds:  aspirin  81 mg Oral Pre-Cath   atorvastatin  40 mg Oral Daily   cefUROXime  500 mg Oral BID WC   Chlorhexidine Gluconate Cloth  6 each Topical Daily   doxycycline  100 mg Oral Q12H   furosemide  40 mg Intravenous BID   hydrocerin   Topical Daily   insulin aspart  0-15 Units Subcutaneous Q4H   mupirocin ointment   Topical BID   Continuous Infusions:  sodium chloride     sodium chloride     PRN Meds: ALPRAZolam, fentaNYL (SUBLIMAZE) injection, ipratropium-albuterol, morphine injection, ondansetron (ZOFRAN) IV, mouth rinse, polyethylene glycol, prochlorperazine   Vital Signs    Vitals:   02/15/23 2048 02/16/23 0040 02/16/23 0427 02/16/23 0744  BP: 135/61 (!) 150/78 (!) 141/85 (!) 142/57  Pulse: 74 82 71 67  Resp: (!) 24 20 20 19   Temp: 98 F (36.7 C) 98 F (36.7 C) 97.7 F (36.5 C) 98.2 F (36.8 C)  TempSrc: Oral     SpO2: 100% 100% 100% 100%  Weight: 127 kg     Height:        Intake/Output Summary (Last 24 hours) at 02/16/2023 0824 Last data filed at 02/16/2023 0139 Gross per 24 hour  Intake 120 ml  Output 2900 ml  Net -2780 ml   Filed Weights   02/11/23 1700 02/15/23 0500 02/15/23 2048  Weight: 131.6 kg 128.5 kg 127 kg    Physical Exam   GEN: Obese, in no acute distress.  HEENT: Grossly normal.  Neck: Supple, obese, difficult to gauge JVP.  No carotid bruits, or masses. Cardiac: RRR, 2/6 SEM @ the  upper sternal borders.  No rubs or gallops. No clubbing, cyanosis, edema.  Radials 2+, DP/PT 2+ and equal bilaterally.  Respiratory:  Respirations regular and labored when speaking.  Diminished breath sounds throughout w/ insp/exp wheezing and scattered rhonci. GI: Obese, soft, nontender, nondistended, BS + x 4. MS: no deformity or atrophy. Skin: warm and dry, no rash. Neuro:  Strength and sensation are intact. Psych: AAOx3.  Normal affect.  Labs    Chemistry Recent Labs  Lab 02/11/23 1134 02/11/23 1931 02/14/23 0239 02/15/23 0512 02/16/23 0430  NA 139   < > 140 138 144  K 2.9*   < > 3.9 4.7 4.7  CL 111   < > 105 106 106  CO2 21*   < > 27 26 31   GLUCOSE 270*   < > 142* 133* 137*  BUN 21   < > 23 24* 25*  CREATININE 1.11*   < > 1.37* 1.25* 1.13*  CALCIUM 6.1*   < > 7.8* 8.1* 8.6*  PROT 5.2*  --   --   --   --   ALBUMIN 2.2*   < > 2.5* 2.6* 2.7*  AST 33  --   --   --   --   ALT 16  --   --   --   --  ALKPHOS 65  --   --   --   --   BILITOT 0.5  --   --   --   --   GFRNONAA 50*   < > 39* 44* 49*  ANIONGAP 7   < > 8 6 7    < > = values in this interval not displayed.     Hematology Recent Labs  Lab 02/14/23 0239 02/15/23 0513 02/16/23 0430  WBC 8.2 7.6 8.2  RBC 3.23* 3.16* 3.38*  HGB 9.2* 9.0* 9.5*  HCT 29.9* 30.1* 31.2*  MCV 92.6 95.3 92.3  MCH 28.5 28.5 28.1  MCHC 30.8 29.9* 30.4  RDW 15.4 15.3 15.5  PLT 126* 139* 150    Cardiac Enzymes  Recent Labs  Lab 02/11/23 2219 02/12/23 0008 02/12/23 0224 02/12/23 1706 02/12/23 1848  TROPONINIHS 1,153* 1,078* 1,028* 508* 515*      BNP    Component Value Date/Time   BNP 623.5 (H) 02/12/2023 0405   Lipids  Lab Results  Component Value Date   CHOL 146 01/23/2022   HDL 36 (L) 01/23/2022   LDLCALC 91 01/23/2022   TRIG 120 02/12/2023   CHOLHDL 4.1 01/23/2022    HbA1c  Lab Results  Component Value Date   HGBA1C 8.8 (H) 02/11/2023    Radiology    DG Chest Port 1 View Result Date:  02/11/2023 IMPRESSION: 1. Left IJ central venous catheter tip over the SVC confluence. No pneumothorax. 2. Cardiomegaly with vascular congestion. Patchy left greater than right basilar airspace disease, atelectasis versus pneumonia. This appears worse compared to prior Electronically Signed   By: Jasmine Pang M.D.   On: 02/11/2023 21:00   CT Angio Chest PE W/Cm &/Or Wo Cm Result Date: 02/11/2023 IMPRESSION: 1. No large central pulmonary embolus. No evidence for lobar or segmental pulmonary embolus. Subsegmental pulmonary arteries to both lower lobes are not reliably evaluated due to bolus timing and motion artifact. 2. Interlobular septal thickening in the upper lobes bilaterally with bilateral asymmetric central predominant ground-glass opacity. Imaging features are compatible with pulmonary edema. 3. Collapse/consolidative disease in the lower lungs bilaterally is associated with small bilateral pleural effusions. Lower lobe disease compatible with atelectasis and/or pneumonia. 4. Bilateral pulmonary nodules noted, unchanged since remote prior where visible although portions of both lungs have been obscured by airspace disease. Consider follow-up CT chest without contrast after resolution of acute symptoms for more definitive evaluation of pulmonary nodules. 5. Cirrhosis. 6.  Aortic Atherosclerosis (ICD10-I70.0). Electronically Signed   By: Kennith Center M.D.   On: 02/11/2023 15:13   CT Head Wo Contrast Result Date: 02/11/2023 IMPRESSION: No evidence of acute intracranial abnormality. An MRI could provide more sensitive evaluation for hypoxic/ischemic injury if clinically warranted Electronically Signed   By: Feliberto Harts M.D.   On: 02/11/2023 14:12   DG Abdomen 1 View Result Date: 02/11/2023 IMPRESSION: 1. Enteric catheter tip projecting over the gastric fundus. Electronically Signed   By: Sharlet Salina M.D.   On: 02/11/2023 11:25   DG Chest Portable 1 View Result Date:  02/11/2023 IMPRESSION: 1. Support devices as above. 2. Pulmonary vascular congestion without overt edema. Electronically Signed   By: Sharlet Salina M.D.   On: 02/11/2023 11:23     Telemetry    RSR, 1st deg AVB - Personally Reviewed  Cardiac Studies   2D Echocardiogram 12.21.2024  1. Left ventricular ejection fraction, by estimation, is 30 to 35%. The  left ventricle has moderately decreased function. The left ventricle  demonstrates  regional wall motion abnormalities (see scoring  diagram/findings for description) and global  hypokinesis. There is mild concentric left ventricular hypertrophy. Left  ventricular diastolic parameters are consistent with Grade II diastolic  dysfunction (pseudonormalization). Elevated left ventricular end-diastolic  pressure. There is akinesis of the   left ventricular, apical septal wall.   2. Right ventricular systolic function is normal. The right ventricular  size is normal.   3. Left atrial size was severely dilated.   4. The mitral valve is degenerative. No evidence of mitral valve  regurgitation. Mild mitral stenosis. The mean mitral valve gradient is 4.0  mmHg. Severe mitral annular calcification.   5. The aortic valve has been repaired/replaced. Perivalvular Aortic valve  regurgitation is mild. 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  There is a 26 mm CoreValve-Evolut Pro prosthetic (TAVR) valve present in  the aortic position. Aortic  regurgitation PHT measures 557 msec. Aortic valve area, by VTI measures  0.93 cm. Aortic valve mean gradient measures 15.0 mmHg. Aortic valve Vmax  measures 2.70 m/s. DVI 0.37.   6. The inferior vena cava is normal in size with greater than 50%  respiratory variability, suggesting right atrial pressure of 3 mmHg.   7. Compared to echo dated 01/28/2021, LVF has declined but apical  inferoseptal wall motion abnormality is unchanged. The mean TAVR gradient  has increased from to and DVI  has decreased from 0.92 to  0.37.    Patient Profile     82 y.o. female  with a hx of nonobstructive CAD by LHC in 2019, severe aortic stenosis status post TAVR in 11/2017, HFpEF, fibromyalgia, DM2 HTN, HLD, and obesity, who was admitted 12/20 due to resp failure, PEA arrest, septic shock, PNA, and bilat lower ext cellulits, complicated by NSTEMI w/ peak hsTrop of 1153, and new finding of LV dysfxn w/ EF of 30-35% by echo.   Assessment & Plan    1.  Acute respiratory failure/Sepsis:  Admitted w/ resp failure req intubation, PEA arrest req, and septic shock.  Tx for bilat lower ext cellulitis and UTI.  Extubated 12/21.  Remains on O2 via [place] w/ diminished breath sounds, wheezing, scattered rhonchi.  Abx per IM.    2.  PEA arrest/hypotension:  In setting of above.  Off pressors and hemodynamically stable, though pressures intermittently soft over past 24 hrs.  Follow.  3.  Acute HFrEF:  in setting of above, Echo w/ new LV dysfxn  EF 30-35% w/ glob HK.  Remains on supplemental O2 w/ improving dyspnea and orthopnea.  Volume status difficult to gauge due to body habitus.  Continue to diurese today.  Defer cath until 12/26.  GDMT limited by intermittent hypotension.  NPO at midnight.   4.  NSTEMI vs Demand Ischemia:  Peak hsTrop up to 1153 in setting of above.  Has chest wall tenderness in setting of CPR prior to admission but no recent anginal symptoms.  Plan for R & L heart Cath - defer to 12/26 in setting of dyspnea and volume overload.  5.  AKI:  creat up to 1.53 on 12/22.  Improved @ 1.13 this AM.  Follow w/ diuresis.  6.  HL:  cont statin.  7.  Normocytic anemia:  stable.  8.  Aortic stenosis s/p TAVR (2019):  Echo this admission w/ mild peri-valvular leak and mild-mod AS.  Not likely contributing to current presentation.   Signed, Eula Listen, PA-C  02/16/2023, 8:24 AM    For questions or updates, please  contact   Please consult [email or web address] for contact info under Cardiology/STEMI.

## 2023-02-16 NOTE — Consult Note (Signed)
PHARMACY CONSULT NOTE - ELECTROLYTES  Pharmacy Consult for Electrolyte Monitoring and Replacement   Recent Labs: Potassium (mmol/L)  Date Value  02/16/2023 4.7   Magnesium (mg/dL)  Date Value  78/29/5621 2.1   Calcium (mg/dL)  Date Value  30/86/5784 8.6 (L)   Albumin (g/dL)  Date Value  69/62/9528 2.7 (L)   Phosphorus (mg/dL)  Date Value  41/32/4401 3.4   Sodium (mmol/L)  Date Value  02/16/2023 144  10/26/2017 142   Height: 5' 2.01" (157.5 cm) Weight: 127 kg (280 lb) IBW/kg (Calculated) : 50.12 Estimated Creatinine Clearance: 50.7 mL/min (A) (by C-G formula based on SCr of 1.13 mg/dL (H)).  Assessment  [name] is a 82 y.o. female presenting with respiratory distress / PEA arrest / septic shock requiring vasopressors. PMH significant for hypertension, diabetes, CHF, aortic stenosis status post TAVR, and nonalcoholic fatty liver disease . Pharmacy has been consulted to monitor and replace electrolytes.  Meds:  furosemide 40 mg IV bid  Goal of Therapy:  Potassium 4.0 - 5.1 mmol/L Magnesium 2.0 - 2.4 mg/dL All Other Electrolytes WNL  Plan:  --NO replacement needed at this time. --Patient has transferred out of ICU- will discontinue CCM pharmacy monitoring consult for electrolytes-  Thank you for allowing pharmacy to be a part of this patient's care.  [name] Mantis PharmD Clinical Pharmacist 02/16/2023

## 2023-02-16 NOTE — H&P (View-Only) (Signed)
Cardiology Progress Note   Patient Name: [name] Date of Encounter: 02/16/2023  Primary Cardiologist: Tonny Bollman, MD  Subjective   Cardia cath postponed on 12/24 due to worsening respiratory status, diuresed with IV Lasix 40 mg bid. Documented UOP 2.7 L for the past 24 hours, net - 4.1 L for the admission. Renal function stable. Dyspnea a little improved. Chest remains sore following CPR, unchanged from earlier in the admission. Remains on supplemental oxygen via nasal cannula at 4 L.   Objective   Inpatient Medications    Scheduled Meds:  aspirin  81 mg Oral Pre-Cath   atorvastatin  40 mg Oral Daily   cefUROXime  500 mg Oral BID WC   Chlorhexidine Gluconate Cloth  6 each Topical Daily   doxycycline  100 mg Oral Q12H   furosemide  40 mg Intravenous BID   hydrocerin   Topical Daily   insulin aspart  0-15 Units Subcutaneous Q4H   mupirocin ointment   Topical BID   Continuous Infusions:  sodium chloride     sodium chloride     PRN Meds: ALPRAZolam, fentaNYL (SUBLIMAZE) injection, ipratropium-albuterol, morphine injection, ondansetron (ZOFRAN) IV, mouth rinse, polyethylene glycol, prochlorperazine   Vital Signs    Vitals:   02/15/23 2048 02/16/23 0040 02/16/23 0427 02/16/23 0744  BP: 135/61 (!) 150/78 (!) 141/85 (!) 142/57  Pulse: 74 82 71 67  Resp: (!) 24 20 20 19   Temp: 98 F (36.7 C) 98 F (36.7 C) 97.7 F (36.5 C) 98.2 F (36.8 C)  TempSrc: Oral     SpO2: 100% 100% 100% 100%  Weight: 127 kg     Height:        Intake/Output Summary (Last 24 hours) at 02/16/2023 0824 Last data filed at 02/16/2023 0139 Gross per 24 hour  Intake 120 ml  Output 2900 ml  Net -2780 ml   Filed Weights   02/11/23 1700 02/15/23 0500 02/15/23 2048  Weight: 131.6 kg 128.5 kg 127 kg    Physical Exam   GEN: Obese, in no acute distress.  HEENT: Grossly normal.  Neck: Supple, obese, difficult to gauge JVP.  No carotid bruits, or masses. Cardiac: RRR, 2/6 SEM @ the  upper sternal borders.  No rubs or gallops. No clubbing, cyanosis, edema.  Radials 2+, DP/PT 2+ and equal bilaterally.  Respiratory:  Respirations regular and labored when speaking.  Diminished breath sounds throughout w/ insp/exp wheezing and scattered rhonci. GI: Obese, soft, nontender, nondistended, BS + x 4. MS: no deformity or atrophy. Skin: warm and dry, no rash. Neuro:  Strength and sensation are intact. Psych: AAOx3.  Normal affect.  Labs    Chemistry Recent Labs  Lab 02/11/23 1134 02/11/23 1931 02/14/23 0239 02/15/23 0512 02/16/23 0430  NA 139   < > 140 138 144  K 2.9*   < > 3.9 4.7 4.7  CL 111   < > 105 106 106  CO2 21*   < > 27 26 31   GLUCOSE 270*   < > 142* 133* 137*  BUN 21   < > 23 24* 25*  CREATININE 1.11*   < > 1.37* 1.25* 1.13*  CALCIUM 6.1*   < > 7.8* 8.1* 8.6*  PROT 5.2*  --   --   --   --   ALBUMIN 2.2*   < > 2.5* 2.6* 2.7*  AST 33  --   --   --   --   ALT 16  --   --   --   --  ALKPHOS 65  --   --   --   --   BILITOT 0.5  --   --   --   --   GFRNONAA 50*   < > 39* 44* 49*  ANIONGAP 7   < > 8 6 7    < > = values in this interval not displayed.     Hematology Recent Labs  Lab 02/14/23 0239 02/15/23 0513 02/16/23 0430  WBC 8.2 7.6 8.2  RBC 3.23* 3.16* 3.38*  HGB 9.2* 9.0* 9.5*  HCT 29.9* 30.1* 31.2*  MCV 92.6 95.3 92.3  MCH 28.5 28.5 28.1  MCHC 30.8 29.9* 30.4  RDW 15.4 15.3 15.5  PLT 126* 139* 150    Cardiac Enzymes  Recent Labs  Lab 02/11/23 2219 02/12/23 0008 02/12/23 0224 02/12/23 1706 02/12/23 1848  TROPONINIHS 1,153* 1,078* 1,028* 508* 515*      BNP    Component Value Date/Time   BNP 623.5 (H) 02/12/2023 0405   Lipids  Lab Results  Component Value Date   CHOL 146 01/23/2022   HDL 36 (L) 01/23/2022   LDLCALC 91 01/23/2022   TRIG 120 02/12/2023   CHOLHDL 4.1 01/23/2022    HbA1c  Lab Results  Component Value Date   HGBA1C 8.8 (H) 02/11/2023    Radiology    DG Chest Port 1 View Result Date:  02/11/2023 IMPRESSION: 1. Left IJ central venous catheter tip over the SVC confluence. No pneumothorax. 2. Cardiomegaly with vascular congestion. Patchy left greater than right basilar airspace disease, atelectasis versus pneumonia. This appears worse compared to prior Electronically Signed   By: Jasmine Pang M.D.   On: 02/11/2023 21:00   CT Angio Chest PE W/Cm &/Or Wo Cm Result Date: 02/11/2023 IMPRESSION: 1. No large central pulmonary embolus. No evidence for lobar or segmental pulmonary embolus. Subsegmental pulmonary arteries to both lower lobes are not reliably evaluated due to bolus timing and motion artifact. 2. Interlobular septal thickening in the upper lobes bilaterally with bilateral asymmetric central predominant ground-glass opacity. Imaging features are compatible with pulmonary edema. 3. Collapse/consolidative disease in the lower lungs bilaterally is associated with small bilateral pleural effusions. Lower lobe disease compatible with atelectasis and/or pneumonia. 4. Bilateral pulmonary nodules noted, unchanged since remote prior where visible although portions of both lungs have been obscured by airspace disease. Consider follow-up CT chest without contrast after resolution of acute symptoms for more definitive evaluation of pulmonary nodules. 5. Cirrhosis. 6.  Aortic Atherosclerosis (ICD10-I70.0). Electronically Signed   By: Kennith Center M.D.   On: 02/11/2023 15:13   CT Head Wo Contrast Result Date: 02/11/2023 IMPRESSION: No evidence of acute intracranial abnormality. An MRI could provide more sensitive evaluation for hypoxic/ischemic injury if clinically warranted Electronically Signed   By: Feliberto Harts M.D.   On: 02/11/2023 14:12   DG Abdomen 1 View Result Date: 02/11/2023 IMPRESSION: 1. Enteric catheter tip projecting over the gastric fundus. Electronically Signed   By: Sharlet Salina M.D.   On: 02/11/2023 11:25   DG Chest Portable 1 View Result Date:  02/11/2023 IMPRESSION: 1. Support devices as above. 2. Pulmonary vascular congestion without overt edema. Electronically Signed   By: Sharlet Salina M.D.   On: 02/11/2023 11:23     Telemetry    RSR, 1st deg AVB - Personally Reviewed  Cardiac Studies   2D Echocardiogram 12.21.2024  1. Left ventricular ejection fraction, by estimation, is 30 to 35%. The  left ventricle has moderately decreased function. The left ventricle  demonstrates  regional wall motion abnormalities (see scoring  diagram/findings for description) and global  hypokinesis. There is mild concentric left ventricular hypertrophy. Left  ventricular diastolic parameters are consistent with Grade II diastolic  dysfunction (pseudonormalization). Elevated left ventricular end-diastolic  pressure. There is akinesis of the   left ventricular, apical septal wall.   2. Right ventricular systolic function is normal. The right ventricular  size is normal.   3. Left atrial size was severely dilated.   4. The mitral valve is degenerative. No evidence of mitral valve  regurgitation. Mild mitral stenosis. The mean mitral valve gradient is 4.0  mmHg. Severe mitral annular calcification.   5. The aortic valve has been repaired/replaced. Perivalvular Aortic valve  regurgitation is mild. 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  There is a 26 mm CoreValve-Evolut Pro prosthetic (TAVR) valve present in  the aortic position. Aortic  regurgitation PHT measures 557 msec. Aortic valve area, by VTI measures  0.93 cm. Aortic valve mean gradient measures 15.0 mmHg. Aortic valve Vmax  measures 2.70 m/s. DVI 0.37.   6. The inferior vena cava is normal in size with greater than 50%  respiratory variability, suggesting right atrial pressure of 3 mmHg.   7. Compared to echo dated 01/28/2021, LVF has declined but apical  inferoseptal wall motion abnormality is unchanged. The mean TAVR gradient  has increased from to and DVI  has decreased from 0.92 to  0.37.    Patient Profile     82 y.o. female  with a hx of nonobstructive CAD by LHC in 2019, severe aortic stenosis status post TAVR in 11/2017, HFpEF, fibromyalgia, DM2 HTN, HLD, and obesity, who was admitted 12/20 due to resp failure, PEA arrest, septic shock, PNA, and bilat lower ext cellulits, complicated by NSTEMI w/ peak hsTrop of 1153, and new finding of LV dysfxn w/ EF of 30-35% by echo.   Assessment & Plan    1.  Acute respiratory failure/Sepsis:  Admitted w/ resp failure req intubation, PEA arrest req, and septic shock.  Tx for bilat lower ext cellulitis and UTI.  Extubated 12/21.  Remains on O2 via [place] w/ diminished breath sounds, wheezing, scattered rhonchi.  Abx per IM.    2.  PEA arrest/hypotension:  In setting of above.  Off pressors and hemodynamically stable, though pressures intermittently soft over past 24 hrs.  Follow.  3.  Acute HFrEF:  in setting of above, Echo w/ new LV dysfxn  EF 30-35% w/ glob HK.  Remains on supplemental O2 w/ improving dyspnea and orthopnea.  Volume status difficult to gauge due to body habitus.  Continue to diurese today.  Defer cath until 12/26.  GDMT limited by intermittent hypotension.  NPO at midnight.   4.  NSTEMI vs Demand Ischemia:  Peak hsTrop up to 1153 in setting of above.  Has chest wall tenderness in setting of CPR prior to admission but no recent anginal symptoms.  Plan for R & L heart Cath - defer to 12/26 in setting of dyspnea and volume overload.  5.  AKI:  creat up to 1.53 on 12/22.  Improved @ 1.13 this AM.  Follow w/ diuresis.  6.  HL:  cont statin.  7.  Normocytic anemia:  stable.  8.  Aortic stenosis s/p TAVR (2019):  Echo this admission w/ mild peri-valvular leak and mild-mod AS.  Not likely contributing to current presentation.   Signed, Eula Listen, PA-C  02/16/2023, 8:24 AM    For questions or updates, please  contact   Please consult [email or web address] for contact info under Cardiology/STEMI.

## 2023-02-16 NOTE — Progress Notes (Signed)
Progress Note   Patient: [name] MWU:[id number] DOB: 1940-08-30 DOA: 02/11/2023     5 DOS: the patient was seen and examined on 02/16/2023   Brief hospital course: "82 year old female with a history of Essential hypertension, heart failure with preserved LVEF, Severe AS s/p TAVR, hypertension, diabetes type 2, diabetic ulcers, fatty liver disease, GERD, chronic dysphagia, history of colon polyps, was admitted and discharged earlier in dec 2024 with b/l LE cellulits and left toe infected ulcer with underlying fracture of phalanx and osteomyelitis. She apparently was on the way to hospital for routine appt and found to be hypoxemic in field. She required intubation for severe hypoxemia in ED. Patient seen in ER with circulatory shock on levophed while intubated..."  Pt required CPR in the ED and was initially admitted to ICU on the ventilator.  See PCCM notes from admission through 12/22. TRH assumed care 02/14/23.  Further hospital course and management as outlined below.   Assessment and Plan:  Cardiopulmonary arrest s/p CPR and ROSC Acute respiratory failure with hypoxia - liberated from ventilator 12/21 Presumed septic shock - POA, shock physiology is resolved. UTI - E coli Elevated troponin - NSTEMI not excluded Acute on Chronic HFrEF --Cardiology is following --Plan for R/L cath this admission - plan for tomorrow AM after further diuresis --on IV Lasix 40 mg BID per cardiology --Completed 48 hours IV heparin --Echo EF 30-35% (prior EF 45% in Aug 2024 at [facility]) --Additional GDMT per Cardiology as BP and renal function permit --Strict Io's and daily weights --Monitor renal function, electrolytes --Supplement O2, maintain sats > 90%, wean as tolerated --Pulmonary hygiene --On PO doxy and ceftin for UTI, cellulitis --Follow BAL and blood cultures  Musculoskeletal chest pain - status post CPR --Pain control PRN, per orders --Renal function is improved, will add low dose IV  Toradol since pt gets nausea with opioids --Tolerates fentanyl better than morphine. 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 --Sliding scale Novolog.   Stage II CKD  --Monitor renal function closely with diuresis --Avoid nephrotoxins and hypotension --Renally dose meds  Hyperlipemia --Lipitor 40 mg   Hypertension - presented in shock BP's are controlled --Holding antihypertensives  Fibromyalgia No acute issues.  Morbid obesity (HCC) Body mass index is 48.02 kg/m. Complicates overall care and prognosis.  Recommend lifestyle modifications including physical activity and diet for weight loss and overall long-term health.   NAFLD (nonalcoholic fatty liver disease) No acute issues.        Subjective: Pt seen awake sitting up in bed this AM.  Reports chest and back continue to hurt, but fairly controlled with medications.  Feels a bit short of breath at times.  No worsening chest pain.     Physical Exam: Vitals:   02/16/23 0040 02/16/23 0427 02/16/23 0744 02/16/23 1144  BP: (!) 150/78 (!) 141/85 (!) 142/57 (!) 140/58  Pulse: 82 71 67 75  Resp: 20 20 19 19   Temp: 98 F (36.7 C) 97.7 F (36.5 C) 98.2 F (36.8 C) 97.7 F (36.5 C)  TempSrc:   Oral Oral  SpO2: 100% 100% 100% 100%  Weight:      Height:       General exam: awake, alert, no acute distress HEENT: moist mucus membranes, hearing  grossly normal  Respiratory system: CTAB with diminished bases bilaterally, no wheezes, rales or rhonchi, normal respiratory effort. On 4 L/min  O2 Cardiovascular system: normal S1/S2, RRR, Left internal jugular central line has been removed with clean dry intact dressing over site Gastrointestinal  system: soft-non-tender, non-distended Central nervous system: A&O x 3. no gross focal neurologic deficits, normal speech Extremities: distal BLE's wrapped, improved BLE erythema, no differential warmth Psychiatry: normal mood, congruent affect, judgement and insight appear normal   Data Reviewed:  Notable labs ---   glucose 137, Cr 1.37>>1.25>>1.13, Ca 8.6, Hbg stable 9.0 >> 9.5 Platelets 139k  Family Communication: None present, will attempt to call  Disposition: Status is: Inpatient Remains inpatient appropriate because: ongoing evaluation, IV therapies, needs clearance by consultants for d/c.   Needs SNF placement   Planned Discharge Destination: Skilled nursing facility     Time spent: 38 minutes  Author: Pennie Banter, DO 02/16/2023 1:09 PM  For on call review [email or web address].

## 2023-02-17 ENCOUNTER — Encounter
Admission: EM | Disposition: A | Discharge status: Skilled Nursing Facility | Payer: Self-pay | Source: Home / Self Care | Attending: Student

## 2023-02-17 DIAGNOSIS — R579 Shock, unspecified: Secondary | ICD-10-CM | POA: Diagnosis not present

## 2023-02-17 DIAGNOSIS — I214 Non-ST elevation (NSTEMI) myocardial infarction: Secondary | ICD-10-CM

## 2023-02-17 DIAGNOSIS — I469 Cardiac arrest, cause unspecified: Secondary | ICD-10-CM | POA: Diagnosis not present

## 2023-02-17 DIAGNOSIS — R7989 Other specified abnormal findings of blood chemistry: Secondary | ICD-10-CM | POA: Diagnosis not present

## 2023-02-17 HISTORY — PX: LEFT HEART CATH AND CORONARY ANGIOGRAPHY: CATH118249

## 2023-02-17 LAB — GLUCOSE, CAPILLARY
Glucose-Capillary: 107 mg/dL — ABNORMAL HIGH (ref 70–99)
Glucose-Capillary: 109 mg/dL — ABNORMAL HIGH (ref 70–99)
Glucose-Capillary: 118 mg/dL — ABNORMAL HIGH (ref 70–99)
Glucose-Capillary: 121 mg/dL — ABNORMAL HIGH (ref 70–99)
Glucose-Capillary: 131 mg/dL — ABNORMAL HIGH (ref 70–99)
Glucose-Capillary: 169 mg/dL — ABNORMAL HIGH (ref 70–99)
Glucose-Capillary: 199 mg/dL — ABNORMAL HIGH (ref 70–99)

## 2023-02-17 LAB — POCT I-STAT 7, (LYTES, BLD GAS, ICA,H+H)
Acid-Base Excess: 6 mmol/L — ABNORMAL HIGH (ref 0.0–2.0)
Bicarbonate: 32.2 mmol/L — ABNORMAL HIGH (ref 20.0–28.0)
Calcium, Ion: 1.2 mmol/L (ref 1.15–1.40)
HCT: 31 % — ABNORMAL LOW (ref 36.0–46.0)
Hemoglobin: 10.5 g/dL — ABNORMAL LOW (ref 12.0–15.0)
O2 Saturation: 92 %
Potassium: 4.4 mmol/L (ref 3.5–5.1)
Sodium: 140 mmol/L (ref 135–145)
TCO2: 34 mmol/L — ABNORMAL HIGH (ref 22–32)
pCO2 arterial: 53.2 mm[Hg] — ABNORMAL HIGH (ref 32–48)
pH, Arterial: 7.391 (ref 7.35–7.45)
pO2, Arterial: 66 mm[Hg] — ABNORMAL LOW (ref 83–108)

## 2023-02-17 SURGERY — LEFT HEART CATH AND CORONARY ANGIOGRAPHY
Anesthesia: Moderate Sedation

## 2023-02-17 MED ORDER — ACETAMINOPHEN 325 MG PO TABS
650.0000 mg | ORAL_TABLET | ORAL | Status: DC | PRN
  Administered 2023-02-17 – 2023-02-24 (×16): 650 mg via ORAL
  Filled 2023-02-17 (×18): qty 2

## 2023-02-17 MED ORDER — HEPARIN SODIUM (PORCINE) 1000 UNIT/ML IJ SOLN
INTRAMUSCULAR | Status: AC
  Filled 2023-02-17: qty 10

## 2023-02-17 MED ORDER — FENTANYL CITRATE (PF) 100 MCG/2ML IJ SOLN
INTRAMUSCULAR | Status: DC | PRN
  Administered 2023-02-17 (×2): 25 ug via INTRAVENOUS

## 2023-02-17 MED ORDER — IOHEXOL 300 MG/ML  SOLN
INTRAMUSCULAR | Status: DC | PRN
  Administered 2023-02-17: 110 mL

## 2023-02-17 MED ORDER — CARVEDILOL 3.125 MG PO TABS
3.1250 mg | ORAL_TABLET | Freq: Two times a day (BID) | ORAL | Status: DC
Start: 2023-02-17 — End: 2023-03-02
  Administered 2023-02-17 – 2023-03-02 (×22): 3.125 mg via ORAL
  Filled 2023-02-17 (×24): qty 1

## 2023-02-17 MED ORDER — ONDANSETRON HCL 4 MG/2ML IJ SOLN
INTRAMUSCULAR | Status: AC
  Filled 2023-02-17: qty 2

## 2023-02-17 MED ORDER — SODIUM CHLORIDE 0.9 % IV SOLN: 250.0000 mL | INTRAVENOUS | Status: AC | PRN

## 2023-02-17 MED ORDER — LIDOCAINE HCL (PF) 1 % IJ SOLN
INTRAMUSCULAR | Status: DC | PRN
  Administered 2023-02-17 (×2): 2 mL

## 2023-02-17 MED ORDER — ONDANSETRON HCL 4 MG/2ML IJ SOLN
INTRAMUSCULAR | Status: DC | PRN
  Administered 2023-02-17: 4 mg via INTRAVENOUS

## 2023-02-17 MED ORDER — MIDAZOLAM HCL 2 MG/2ML IJ SOLN
INTRAMUSCULAR | Status: AC
Start: 2023-02-17 — End: ?
  Filled 2023-02-17: qty 2

## 2023-02-17 MED ORDER — HEPARIN (PORCINE) IN NACL 1000-0.9 UT/500ML-% IV SOLN
INTRAVENOUS | Status: AC
Start: 2023-02-17 — End: ?
  Filled 2023-02-17: qty 1000

## 2023-02-17 MED ORDER — FUROSEMIDE 40 MG PO TABS
40.0000 mg | ORAL_TABLET | Freq: Two times a day (BID) | ORAL | Status: DC
  Administered 2023-02-17 – 2023-03-02 (×24): 40 mg via ORAL
  Filled 2023-02-17 (×30): qty 1

## 2023-02-17 MED ORDER — HEPARIN (PORCINE) IN NACL 2000-0.9 UNIT/L-% IV SOLN
INTRAVENOUS | Status: DC | PRN
  Administered 2023-02-17: 1000 mL

## 2023-02-17 MED ORDER — IPRATROPIUM-ALBUTEROL 0.5-2.5 (3) MG/3ML IN SOLN
RESPIRATORY_TRACT | Status: AC
Start: 2023-02-17 — End: ?
  Filled 2023-02-17: qty 3

## 2023-02-17 MED ORDER — VERAPAMIL HCL 2.5 MG/ML IV SOLN
INTRAVENOUS | Status: AC
  Filled 2023-02-17: qty 2

## 2023-02-17 MED ORDER — SODIUM CHLORIDE 0.9% FLUSH: 3.0000 mL | INTRAVENOUS | Status: DC | PRN

## 2023-02-17 MED ORDER — SODIUM CHLORIDE 0.9% FLUSH
3.0000 mL | Freq: Two times a day (BID) | INTRAVENOUS | Status: DC
  Administered 2023-02-17 – 2023-02-26 (×18): 3 mL via INTRAVENOUS

## 2023-02-17 MED ORDER — MIDAZOLAM HCL 2 MG/2ML IJ SOLN
INTRAMUSCULAR | Status: DC | PRN
  Administered 2023-02-17: 1 mg via INTRAVENOUS

## 2023-02-17 MED ORDER — FENTANYL CITRATE (PF) 100 MCG/2ML IJ SOLN
INTRAMUSCULAR | Status: AC
  Filled 2023-02-17: qty 2

## 2023-02-17 SURGICAL SUPPLY — 16 items
CATH INFINITI 5FR MULTPACK ANG (CATHETERS) IMPLANT
DEVICE CLOSURE MYNXGRIP 5F (Vascular Products) IMPLANT
DRAPE BRACHIAL (DRAPES) IMPLANT
GLIDESHEATH SLEND SS 6F .021 (SHEATH) IMPLANT
GUIDEWIRE INQWIRE 1.5J.035X260 (WIRE) IMPLANT
INQWIRE 1.5J .035X260CM (WIRE)
KIT MICROPUNCTURE NIT STIFF (SHEATH) IMPLANT
KIT SYRINGE INJ CVI SPIKEX1 (MISCELLANEOUS) IMPLANT
PACK CARDIAC CATH (CUSTOM PROCEDURE TRAY) ×1 IMPLANT
PANNUS RETENTION SYSTEM 2 PAD (MISCELLANEOUS) IMPLANT
PROTECTION STATION PRESSURIZED (MISCELLANEOUS) ×1
SET ATX-X65L (MISCELLANEOUS) IMPLANT
SHEATH AVANTI 5FR X 11CM (SHEATH) IMPLANT
SHEATH GLIDE SLENDER 4/5FR (SHEATH) IMPLANT
STATION PROTECTION PRESSURIZED (MISCELLANEOUS) IMPLANT
WIRE EMERALD 3MM-J .035X150CM (WIRE) IMPLANT

## 2023-02-17 NOTE — Progress Notes (Addendum)
Progress Note   Patient: [name] [id number] DOB: Sep 05, 1940 DOA: 02/11/2023     6 DOS: the patient was seen and examined on 02/17/2023   Brief hospital course: "82 year old female with a history of Essential hypertension, heart failure with preserved LVEF, Severe AS s/p TAVR, hypertension, diabetes type 2, diabetic ulcers, fatty liver disease, GERD, chronic dysphagia, history of colon polyps, was admitted and discharged earlier in dec 2024 with b/l LE cellulits and left toe infected ulcer with underlying fracture of phalanx and osteomyelitis. She apparently was on the way to hospital for routine appt and found to be hypoxemic in field. She required intubation for severe hypoxemia in ED. Patient seen in ER with circulatory shock on levophed while intubated..."  Pt required CPR in the ED and was initially admitted to ICU on the ventilator.  See PCCM notes from admission through 12/22. TRH assumed care 02/14/23.  12/23--25:   pt underwent IV diuresis with Cardiology.   12/26:   Cardiac cath done and transitioned to PO Lasix on 12/26.    Further hospital course and management as outlined below.   Assessment and Plan:  Cardiopulmonary arrest s/p CPR and ROSC Acute respiratory failure with hypoxia - liberated from ventilator 12/21 Presumed septic shock - POA, shock physiology is resolved. UTI - E coli, Cellulitis Elevated troponin - demand ischemia.  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 Acute on Chronic HFrEF Echo EF 30-35% (prior EF 45% in Aug 2024 at [facility]) Completed 48 hours IV heparin --Cardiology is following --Transitioned from IV Lasix 40 mg BID >> Lasix 40 mg PO BID --Low dose Coreg added --Additional GDMT per Cardiology as BP and renal function permit --Strict Io's and daily weights --Monitor renal function, electrolytes --Supplement O2, maintain sats > 90%, wean as tolerated --Pulmonary hygiene --Completed course of PO doxy and ceftin for UTI,  cellulitis --Blood cultures negative --BAL culture normal respiratory flora  Musculoskeletal chest pain - status post CPR --Pain control PRN, per orders --Renal function is improved, will add low dose IV Toradol since pt gets nausea with opioids --Tolerates fentanyl better than morphine. 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 --Sliding scale Novolog.   Stage II CKD  --Monitor renal function closely with diuresis --Avoid nephrotoxins and hypotension --Renally dose meds  Hyperlipemia --Lipitor 40 mg   Hypertension - presented in shock BP's are controlled --Holding antihypertensives  Fibromyalgia No acute issues.  Morbid obesity (HCC) Body mass index is 48.02 kg/m. Complicates overall care and prognosis.  Recommend lifestyle modifications including physical activity and diet for weight loss and overall long-term health.   NAFLD (nonalcoholic fatty liver disease) No acute issues.        Subjective: Pt seen after cath today.  She reports ongoing but slightly improved chest and back pain from CPR.  Denies fever/chills, CP, SOB at rest or other acute complaints.  Overall feeling better.   Physical Exam: Vitals:   02/17/23 0945 02/17/23 1000 02/17/23 1035 02/17/23 1103  BP: 137/63 (!) 147/51 (!) 143/71 125/64  Pulse: 80 80 78   Resp: 19 (!) 22 19 17   Temp:   97.7 F (36.5 C) 97.8 F (36.6 C)  TempSrc:    Oral  SpO2: 99% 97% 99%   Weight:      Height:  General exam: awake, alert, no acute distress HEENT: moist mucus membranes, hearing grossly normal  Respiratory system: CTAB diminished bases due to low inspiratory volumes, no wheezes, rales or rhonchi, normal respiratory effort. Cardiovascular  system: normal S1/S2, RRR, improving BLE edema.   Gastrointestinal system: soft, NT, ND, no HSM felt, +bowel sounds. Central nervous system: A&O x 3. no gross focal neurologic deficits, normal speech Extremities: distal BLE's wrapped, normal tone Skin: dry, intact, normal temperature Psychiatry: normal mood, congruent affect, judgement and insight appear normal    Data Reviewed:  Normal Na, K and ionized Ca.  Hbg stable 10.5 from 9.5  Notable labs from 12/25 ---   glucose 137, Cr 1.37>>1.25>>1.13, Ca 8.6  12/26 -- cardiac cath showed mild non-obstructive CAD, mildly elevated EDP 14 mmHg  Family Communication: None present, will attempt to call  Disposition: Status is: Inpatient Remains inpatient appropriate because: ongoing evaluation, IV therapies, needs clearance by consultants for d/c.   Needs SNF placement   Planned Discharge Destination: Skilled nursing facility     Time spent: 38 minutes  Author: Pennie Banter, DO 02/17/2023 12:53 PM  For on call review [email or web address].

## 2023-02-17 NOTE — Progress Notes (Signed)
Physical Therapy Treatment Patient Details Name: [name] MRN: [id number] DOB: 1940/07/08 Today's Date: 02/17/2023   History of Present Illness Pt is an 82 y/o F admitted on 02/11/23. Pt was d/c earlier in Dec. 2024 after being treated for BLE cellulitis & L toe infected ulcer with underlying fx of phalanx & osteomyelitis. Pt was on the way to the hospital for an appointment & found to be hypoxic. Pt required intubation for severe hypoxemia & developed circulatory shock. Pt was extubated 02/12/23. PMH: essential HTN, HF with preserved LVEF, severe AS s/p TAVR, HTN, DM2, diabetic ulcers, fatty liver disease, GERD, chronic dysphagia, colon polyps, L great toe amputation 01/24/23    PT Comments  Patient received in bed, she is agreeable to PT session. Patient requires mod A for bed mobility, min A to stand and min A to take several steps with RW to recliner. Patient is fearful of falling and has STM deficits. She will continue to benefit from skilled PT to improve functional independence, strength and endurance.     If plan is discharge home, recommend the following: A lot of help with walking and/or transfers;A lot of help with bathing/dressing/bathroom;Assistance with cooking/housework;Assist for transportation;Help with stairs or ramp for entrance   Can travel by private vehicle     No  Equipment Recommendations  Other (comment) (TBD)    Recommendations for Other Services       Precautions / Restrictions Precautions Precautions: Fall Restrictions Weight Bearing Restrictions Per Provider Order: No LLE Weight Bearing Per Provider Order: Weight bearing as tolerated     Mobility  Bed Mobility Overal bed mobility: Needs Assistance Bed Mobility: Supine to Sit     Supine to sit: Mod assist, HOB elevated, Used rails          Transfers Overall transfer level: Needs assistance Equipment used: Rolling walker (2 wheels) Transfers: Sit to/from Stand Sit to Stand: Contact guard  assist   Step pivot transfers: Contact guard assist            Ambulation/Gait Ambulation/Gait assistance: Contact guard assist Gait Distance (Feet): 4 Feet Assistive device: Rolling walker (2 wheels) Gait Pattern/deviations: Step-to pattern Gait velocity: decr     General Gait Details: patient fearful of falling, requires cga/min A for safety and RW   Stairs             Wheelchair Mobility     Tilt Bed    Modified Rankin (Stroke Patients Only)       Balance Overall balance assessment: Needs assistance Sitting-balance support: Feet supported Sitting balance-Leahy Scale: Fair     Standing balance support: Bilateral upper extremity supported, During functional activity, Reliant on assistive device for balance Standing balance-Leahy Scale: Fair                              Cognition Arousal: Alert Behavior During Therapy: WFL for tasks assessed/performed Overall Cognitive Status: History of cognitive impairments - at baseline Area of Impairment: Memory                       Following Commands: Follows one step commands consistently, Follows one step commands with increased time     Problem Solving: Slow processing, Requires verbal cues          Exercises      General Comments        Pertinent Vitals/Pain Pain Assessment Pain Assessment: No/denies pain Pain Intervention(s):  Monitored during session, Repositioned    Home Living                          Prior Function            PT Goals (current goals can now be found in the care plan section) Acute Rehab PT Goals Patient Stated Goal: get better PT Goal Formulation: With patient Time For Goal Achievement: 02/27/23 Potential to Achieve Goals: Good Progress towards PT goals: Progressing toward goals    Frequency    Min 1X/week      PT Plan      Co-evaluation              AM-PAC PT "6 Clicks" Mobility   Outcome Measure  Help needed  turning from your back to your side while in a flat bed without using bedrails?: A Little Help needed moving from lying on your back to sitting on the side of a flat bed without using bedrails?: A Lot Help needed moving to and from a bed to a chair (including a wheelchair)?: A Little Help needed standing up from a chair using your arms (e.g., wheelchair or bedside chair)?: A Little Help needed to walk in hospital room?: A Lot Help needed climbing 3-5 steps with a railing? : Total 6 Click Score: 14    End of Session Equipment Utilized During Treatment: Oxygen Activity Tolerance: Patient limited by fatigue Patient left: in chair;with call bell/phone within reach Nurse Communication: Mobility status PT Visit Diagnosis: Other abnormalities of gait and mobility (R26.89);Muscle weakness (generalized) (M62.81);Difficulty in walking, not elsewhere classified (R26.2)     Time: 5409-8119 PT Time Calculation (min) (ACUTE ONLY): 17 min  Charges:    $Therapeutic Activity: 8-22 mins PT General Charges $$ ACUTE PT VISIT: 1 Visit                     Towana Stenglein, PT, GCS 02/17/23,3:57 PM

## 2023-02-17 NOTE — Interval H&P Note (Signed)
History and Physical Interval Note:  02/17/2023 8:06 AM  [name]  has presented today for surgery, with the diagnosis of nstemi.  The various methods of treatment have been discussed with the patient and family. After consideration of risks, benefits and other options for treatment, the patient has consented to  Procedure(s): RIGHT/LEFT HEART CATH AND CORONARY ANGIOGRAPHY (N/A) as a surgical intervention.  The patient's history has been reviewed, patient examined, no change in status, stable for surgery.  I have reviewed the patient's chart and labs.  Questions were answered to the patient's satisfaction.     Lorine Bears

## 2023-02-17 NOTE — Plan of Care (Signed)
  Problem: Education: Goal: Knowledge of General Education information will improve Description: Including pain rating scale, medication(s)/side effects and non-pharmacologic comfort measures Outcome: Progressing   Problem: Health Behavior/Discharge Planning: Goal: Ability to manage health-related needs will improve Outcome: Progressing   Problem: Clinical Measurements: Goal: Ability to maintain clinical measurements within normal limits will improve Outcome: Progressing Goal: Will remain free from infection Outcome: Progressing Goal: Diagnostic test results will improve Outcome: Progressing Goal: Respiratory complications will improve Outcome: Progressing Goal: Cardiovascular complication will be avoided Outcome: Progressing   Problem: Activity: Goal: Risk for activity intolerance will decrease Outcome: Progressing   Problem: Nutrition: Goal: Adequate nutrition will be maintained Outcome: Progressing   Problem: Coping: Goal: Level of anxiety will decrease Outcome: Progressing   Problem: Elimination: Goal: Will not experience complications related to bowel motility Outcome: Progressing Goal: Will not experience complications related to urinary retention Outcome: Progressing   Problem: Pain Management: Goal: General experience of comfort will improve Outcome: Progressing   Problem: Safety: Goal: Ability to remain free from injury will improve Outcome: Progressing   Problem: Skin Integrity: Goal: Risk for impaired skin integrity will decrease Outcome: Progressing   Problem: Activity: Goal: Ability to tolerate increased activity will improve Outcome: Progressing   Problem: Respiratory: Goal: Ability to maintain a clear airway and adequate ventilation will improve Outcome: Progressing   Problem: Role Relationship: Goal: Method of communication will improve Outcome: Progressing   Problem: Education: Goal: Ability to describe self-care measures that may  prevent or decrease complications (Diabetes Survival Skills Education) will improve Outcome: Progressing Goal: Individualized Educational Video(s) Outcome: Progressing   Problem: Coping: Goal: Ability to adjust to condition or change in health will improve Outcome: Progressing   Problem: Fluid Volume: Goal: Ability to maintain a balanced intake and output will improve Outcome: Progressing   Problem: Health Behavior/Discharge Planning: Goal: Ability to identify and utilize available resources and services will improve Outcome: Progressing Goal: Ability to manage health-related needs will improve Outcome: Progressing   Problem: Metabolic: Goal: Ability to maintain appropriate glucose levels will improve Outcome: Progressing   Problem: Nutritional: Goal: Maintenance of adequate nutrition will improve Outcome: Progressing Goal: Progress toward achieving an optimal weight will improve Outcome: Progressing   Problem: Skin Integrity: Goal: Risk for impaired skin integrity will decrease Outcome: Progressing   Problem: Tissue Perfusion: Goal: Adequacy of tissue perfusion will improve Outcome: Progressing   Problem: Education: Goal: Understanding of CV disease, CV risk reduction, and recovery process will improve Outcome: Progressing Goal: Individualized Educational Video(s) Outcome: Progressing   Problem: Activity: Goal: Ability to return to baseline activity level will improve Outcome: Progressing   Problem: Cardiovascular: Goal: Ability to achieve and maintain adequate cardiovascular perfusion will improve Outcome: Progressing Goal: Vascular access site(s) Level 0-1 will be maintained Outcome: Progressing   Problem: Health Behavior/Discharge Planning: Goal: Ability to safely manage health-related needs after discharge will improve Outcome: Progressing

## 2023-02-17 NOTE — Progress Notes (Signed)
Cardiology Progress Note   Patient Name: [name] Date of Encounter: 02/17/2023  Primary Cardiologist: Tonny Bollman, MD  Subjective   Cardiac catheterization was performed this morning via the right femoral artery and showed mild nonobstructive coronary artery disease.  LVEDP was mildly elevated at 14 mmHg.  Objective   Inpatient Medications    Scheduled Meds:  atorvastatin  40 mg Oral Daily   Chlorhexidine Gluconate Cloth  6 each Topical Daily   furosemide  40 mg Oral BID   hydrocerin   Topical Daily   insulin aspart  0-15 Units Subcutaneous Q4H   mupirocin ointment   Topical BID   sodium chloride flush  3 mL Intravenous Q12H   Continuous Infusions:  sodium chloride     PRN Meds: sodium chloride, acetaminophen, ALPRAZolam, fentaNYL (SUBLIMAZE) injection, ipratropium-albuterol, ketorolac, morphine injection, ondansetron (ZOFRAN) IV, mouth rinse, polyethylene glycol, prochlorperazine, sodium chloride flush   Vital Signs    Vitals:   02/17/23 0945 02/17/23 1000 02/17/23 1035 02/17/23 1103  BP: 137/63 (!) 147/51 (!) 143/71 125/64  Pulse: 80 80 78   Resp: 19 (!) 22 19 17   Temp:   97.7 F (36.5 C) 97.8 F (36.6 C)  TempSrc:    Oral  SpO2: 99% 97% 99%   Weight:      Height:        Intake/Output Summary (Last 24 hours) at 02/17/2023 1153 Last data filed at 02/17/2023 0004 Gross per 24 hour  Intake 240 ml  Output 2000 ml  Net -1760 ml   Filed Weights   02/11/23 1700 02/15/23 0500 02/15/23 2048  Weight: 131.6 kg 128.5 kg 127 kg    Physical Exam   GEN: Obese, in no acute distress.  HEENT: Grossly normal.  Neck: Supple, obese, difficult to gauge JVP.  No carotid bruits, or masses. Cardiac: RRR, 2/6 SEM @ the upper sternal borders.  No rubs or gallops. No clubbing, cyanosis, edema.  Radials 2+, DP/PT 2+ and equal bilaterally.  Respiratory:  Respirations regular and labored when speaking.  Diminished breath sounds throughout w/ insp/exp wheezing and  scattered rhonci. GI: Obese, soft, nontender, nondistended, BS + x 4. MS: no deformity or atrophy. Skin: warm and dry, no rash. Neuro:  Strength and sensation are intact. Psych: AAOx3.  Normal affect.  Labs    Chemistry Recent Labs  Lab 02/11/23 1134 02/11/23 1931 02/14/23 0239 02/15/23 0512 02/16/23 0430 02/17/23 0857  NA 139   < > 140 138 144 140  K 2.9*   < > 3.9 4.7 4.7 4.4  CL 111   < > 105 106 106  --   CO2 21*   < > 27 26 31   --   GLUCOSE 270*   < > 142* 133* 137*  --   BUN 21   < > 23 24* 25*  --   CREATININE 1.11*   < > 1.37* 1.25* 1.13*  --   CALCIUM 6.1*   < > 7.8* 8.1* 8.6*  --   PROT 5.2*  --   --   --   --   --   ALBUMIN 2.2*   < > 2.5* 2.6* 2.7*  --   AST 33  --   --   --   --   --   ALT 16  --   --   --   --   --   ALKPHOS 65  --   --   --   --   --  BILITOT 0.5  --   --   --   --   --   GFRNONAA 50*   < > 39* 44* 49*  --   ANIONGAP 7   < > 8 6 7   --    < > = values in this interval not displayed.     Hematology Recent Labs  Lab 02/14/23 0239 02/15/23 0513 02/16/23 0430 02/17/23 0857  WBC 8.2 7.6 8.2  --   RBC 3.23* 3.16* 3.38*  --   HGB 9.2* 9.0* 9.5* 10.5*  HCT 29.9* 30.1* 31.2* 31.0*  MCV 92.6 95.3 92.3  --   MCH 28.5 28.5 28.1  --   MCHC 30.8 29.9* 30.4  --   RDW 15.4 15.3 15.5  --   PLT 126* 139* 150  --     Cardiac Enzymes  Recent Labs  Lab 02/11/23 2219 02/12/23 0008 02/12/23 0224 02/12/23 1706 02/12/23 1848  TROPONINIHS 1,153* 1,078* 1,028* 508* 515*      BNP    Component Value Date/Time   BNP 623.5 (H) 02/12/2023 0405   Lipids  Lab Results  Component Value Date   CHOL 146 01/23/2022   HDL 36 (L) 01/23/2022   LDLCALC 91 01/23/2022   TRIG 120 02/12/2023   CHOLHDL 4.1 01/23/2022    HbA1c  Lab Results  Component Value Date   HGBA1C 8.8 (H) 02/11/2023    Radiology    DG Chest Port 1 View Result Date: 02/11/2023 IMPRESSION: 1. Left IJ central venous catheter tip over the SVC confluence. No pneumothorax. 2.  Cardiomegaly with vascular congestion. Patchy left greater than right basilar airspace disease, atelectasis versus pneumonia. This appears worse compared to prior Electronically Signed   By: Jasmine Pang M.D.   On: 02/11/2023 21:00   CT Angio Chest PE W/Cm &/Or Wo Cm Result Date: 02/11/2023 IMPRESSION: 1. No large central pulmonary embolus. No evidence for lobar or segmental pulmonary embolus. Subsegmental pulmonary arteries to both lower lobes are not reliably evaluated due to bolus timing and motion artifact. 2. Interlobular septal thickening in the upper lobes bilaterally with bilateral asymmetric central predominant ground-glass opacity. Imaging features are compatible with pulmonary edema. 3. Collapse/consolidative disease in the lower lungs bilaterally is associated with small bilateral pleural effusions. Lower lobe disease compatible with atelectasis and/or pneumonia. 4. Bilateral pulmonary nodules noted, unchanged since remote prior where visible although portions of both lungs have been obscured by airspace disease. Consider follow-up CT chest without contrast after resolution of acute symptoms for more definitive evaluation of pulmonary nodules. 5. Cirrhosis. 6.  Aortic Atherosclerosis (ICD10-I70.0). Electronically Signed   By: Kennith Center M.D.   On: 02/11/2023 15:13   CT Head Wo Contrast Result Date: 02/11/2023 IMPRESSION: No evidence of acute intracranial abnormality. An MRI could provide more sensitive evaluation for hypoxic/ischemic injury if clinically warranted Electronically Signed   By: Feliberto Harts M.D.   On: 02/11/2023 14:12   DG Abdomen 1 View Result Date: 02/11/2023 IMPRESSION: 1. Enteric catheter tip projecting over the gastric fundus. Electronically Signed   By: Sharlet Salina M.D.   On: 02/11/2023 11:25   DG Chest Portable 1 View Result Date: 02/11/2023 IMPRESSION: 1. Support devices as above. 2. Pulmonary vascular congestion without overt edema. Electronically Signed    By: Sharlet Salina M.D.   On: 02/11/2023 11:23     Telemetry    RSR, 1st deg AVB - Personally Reviewed  Cardiac Studies   2D Echocardiogram 12.21.2024  1. Left ventricular ejection fraction,  by estimation, is 30 to 35%. The  left ventricle has moderately decreased function. The left ventricle  demonstrates regional wall motion abnormalities (see scoring  diagram/findings for description) and global  hypokinesis. There is mild concentric left ventricular hypertrophy. Left  ventricular diastolic parameters are consistent with Grade II diastolic  dysfunction (pseudonormalization). Elevated left ventricular end-diastolic  pressure. There is akinesis of the   left ventricular, apical septal wall.   2. Right ventricular systolic function is normal. The right ventricular  size is normal.   3. Left atrial size was severely dilated.   4. The mitral valve is degenerative. No evidence of mitral valve  regurgitation. Mild mitral stenosis. The mean mitral valve gradient is 4.0  mmHg. Severe mitral annular calcification.   5. The aortic valve has been repaired/replaced. Perivalvular Aortic valve  regurgitation is mild. 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  There is a 26 mm CoreValve-Evolut Pro prosthetic (TAVR) valve present in  the aortic position. Aortic  regurgitation PHT measures 557 msec. Aortic valve area, by VTI measures  0.93 cm. Aortic valve mean gradient measures 15.0 mmHg. Aortic valve Vmax  measures 2.70 m/s. DVI 0.37.   6. The inferior vena cava is normal in size with greater than 50%  respiratory variability, suggesting right atrial pressure of 3 mmHg.   7. Compared to echo dated 01/28/2021, LVF has declined but apical  inferoseptal wall motion abnormality is unchanged. The mean TAVR gradient  has increased from to and DVI has decreased from 0.92 to  0.37.    Patient Profile     82 y.o. female  with a hx of nonobstructive CAD by LHC in 2019,  severe aortic stenosis status post TAVR in 11/2017, HFpEF, fibromyalgia, DM2 HTN, HLD, and obesity, who was admitted 12/20 due to resp failure, PEA arrest, septic shock, PNA, and bilat lower ext cellulits, complicated by NSTEMI w/ peak hsTrop of 1153, and new finding of LV dysfxn w/ EF of 30-35% by echo.   Assessment & Plan    1.  Acute respiratory failure/Sepsis:  Admitted w/ resp failure req intubation, PEA arrest req, and septic shock.  Tx for bilat lower ext cellulitis and UTI.  Extubated 12/21.    2.  PEA arrest/hypotension:  In setting of above.  Off pressors and hemodynamically stable.  3.  Acute HFrEF:  in setting of above, Echo w/ new LV dysfxn  EF 30-35% w/ glob HK.   Cardiac catheterization was performed this morning and showed mild nonobstructive coronary artery disease.  LVEDP was 14 mmHg. I switched IV furosemide to oral 40 mg twice daily. Will add small dose carvedilol.  Consider adding ARB or ARB.  4.  Elevated troponin due to supply demand match.  Cardiac catheterization showed no evidence of obstructive disease.    5.  AKI:  creat up to 1.53 on 12/22.  Gradual improvement.  6.  HL:  cont statin.  7.  Normocytic anemia:  stable.  8.  Aortic stenosis s/p TAVR (2019):  Echo this admission w/ mild peri-valvular leak and mild-mod AS.  Not likely contributing to current presentation.   Signed, Lorine Bears, MD  02/17/2023, 11:53 AM    For questions or updates, please contact   Please consult [email or web address] for contact info under Cardiology/STEMI.

## 2023-02-18 ENCOUNTER — Encounter: Payer: Self-pay | Admitting: Cardiovascular Disease

## 2023-02-18 DIAGNOSIS — R579 Shock, unspecified: Secondary | ICD-10-CM | POA: Diagnosis not present

## 2023-02-18 DIAGNOSIS — J9601 Acute respiratory failure with hypoxia: Secondary | ICD-10-CM | POA: Diagnosis not present

## 2023-02-18 DIAGNOSIS — I469 Cardiac arrest, cause unspecified: Secondary | ICD-10-CM | POA: Diagnosis not present

## 2023-02-18 DIAGNOSIS — J989 Respiratory disorder, unspecified: Secondary | ICD-10-CM | POA: Diagnosis not present

## 2023-02-18 DIAGNOSIS — I2510 Atherosclerotic heart disease of native coronary artery without angina pectoris: Secondary | ICD-10-CM | POA: Diagnosis not present

## 2023-02-18 DIAGNOSIS — I420 Dilated cardiomyopathy: Secondary | ICD-10-CM

## 2023-02-18 LAB — BASIC METABOLIC PANEL
Anion gap: 11 (ref 5–15)
BUN: 39 mg/dL — ABNORMAL HIGH (ref 8–23)
CO2: 31 mmol/L (ref 22–32)
Calcium: 8.7 mg/dL — ABNORMAL LOW (ref 8.9–10.3)
Chloride: 97 mmol/L — ABNORMAL LOW (ref 98–111)
Creatinine, Ser: 1.31 mg/dL — ABNORMAL HIGH (ref 0.44–1.00)
GFR, Estimated: 41 mL/min — ABNORMAL LOW (ref >60)
Glucose, Bld: 148 mg/dL — ABNORMAL HIGH (ref 70–99)
Potassium: 4.2 mmol/L (ref 3.5–5.1)
Sodium: 139 mmol/L (ref 135–145)

## 2023-02-18 LAB — CBC
HCT: 31.8 % — ABNORMAL LOW (ref 36.0–46.0)
Hemoglobin: 9.9 g/dL — ABNORMAL LOW (ref 12.0–15.0)
MCH: 28.3 pg (ref 26.0–34.0)
MCHC: 31.1 g/dL (ref 30.0–36.0)
MCV: 90.9 fL (ref 80.0–100.0)
Platelets: 150 10*3/uL (ref 150–400)
RBC: 3.5 MIL/uL — ABNORMAL LOW (ref 3.87–5.11)
RDW: 15.2 % (ref 11.5–15.5)
WBC: 8.1 10*3/uL (ref 4.0–10.5)
nRBC: 0 % (ref 0.0–0.2)

## 2023-02-18 LAB — GLUCOSE, CAPILLARY
Glucose-Capillary: 125 mg/dL — ABNORMAL HIGH (ref 70–99)
Glucose-Capillary: 131 mg/dL — ABNORMAL HIGH (ref 70–99)
Glucose-Capillary: 132 mg/dL — ABNORMAL HIGH (ref 70–99)
Glucose-Capillary: 149 mg/dL — ABNORMAL HIGH (ref 70–99)
Glucose-Capillary: 156 mg/dL — ABNORMAL HIGH (ref 70–99)
Glucose-Capillary: 159 mg/dL — ABNORMAL HIGH (ref 70–99)

## 2023-02-18 MED ORDER — LOSARTAN POTASSIUM 25 MG PO TABS
12.5000 mg | ORAL_TABLET | Freq: Every day | ORAL | Status: DC
  Administered 2023-02-18 – 2023-03-02 (×11): 12.5 mg via ORAL
  Filled 2023-02-18 (×12): qty 1

## 2023-02-18 MED ORDER — LISINOPRIL 5 MG PO TABS
2.5000 mg | ORAL_TABLET | Freq: Every day | ORAL | Status: DC
  Filled 2023-02-18: qty 1

## 2023-02-18 NOTE — TOC Progression Note (Signed)
Transition of Care [facility]) - Progression Note    Patient Details  Name: [name] MRN: [id number] Date of Birth: 1940-12-20  Transition of Care [facility]) CM/SW Contact  Garret Reddish, RN Phone Number: 02/18/2023, 2:03 PM  Clinical Narrative:    Reviewed bed offers with patient and her son Joey.  Joey would like for patient to be closer to [facility] as that is where Mrs. Rolon and he lives.  Joey wanted to tour [facility] [place] [facility] [place] today and will follow back up with TOC to let us know what facility he would like patient to discharge too.     Expected Discharge Plan: Skilled Nursing Facility Barriers to Discharge: Continued Medical Work up  Expected Discharge Plan and Services     Post Acute Care Choice: Skilled Nursing Facility Living arrangements for the past 2 months: Single Family Home                                       Social Determinants of Health (SDOH) Interventions SDOH Screenings   Food Insecurity: Patient Unable To Answer (02/11/2023)  Housing: Unknown (02/18/2023)  Transportation Needs: No Transportation Needs (02/18/2023)  Utilities: Not At Risk (02/18/2023)  Alcohol Screen: Low Risk  (02/18/2023)  Financial Resource Strain: Medium Risk (02/18/2023)  Social Connections: Socially Isolated (10/22/2022)   Received from [facility]  Stress: No Stress Concern Present (10/22/2022)   Received from [facility]  Tobacco Use: Medium Risk (02/18/2023)  Health Literacy: Low Risk  (10/22/2022)   Received from [facility]    Readmission Risk Interventions     No data to display

## 2023-02-18 NOTE — Progress Notes (Signed)
Heart Failure Nurse Navigator Progress Note  PCP: Kaleen Mask, MD PCP-Cardiologist: Tonny Bollman, MD Admission Diagnosis:  Cardiac Arrest [facility]) Acute Respiratory failure with hypoxia and hypercapnia (HCC) Hypocalcemia  Admitted from: Home  Presentation:   Daisy Lazar presented with shortness of breath, pale skin and blue lips. ED staff assisted patient to a wheelchair and she went into respiratory distress/PEA arrest/septic shock.CPR initiated. BNP on arrival was 1,124.0. Troponin 45. NW:[id number] CAD, TAVR 2019, HFpEF, Fibromyalgia, DM2, HTN, HLD  and Obesity.  ECHO/ LVEF: 30-35%  Clinical Course:  Past Medical History:  Diagnosis Date   Abnormal thyroid biopsy 2018   results were negative.   Anemia    "when I was alot younger" (12/06/2017)   Anxiety    self reported   Arthritis    "almost all over; used to cry w/it when I was in my teens" (12/06/2017)   CHF (congestive heart failure) (HCC)    Chronic back pain    "all over" (12/06/2017)   Depression    "lost my son last year to cancer; I tended to him; he lived w/me" (12/06/2017)   Diverticulosis of colon (without mention of hemorrhage)    External hemorrhoids without mention of complication    Fatty liver disease, nonalcoholic    Fibromyalgia    History of kidney stones    History of stomach ulcers 1970   Hyperlipemia    Hypertension    IBS (irritable bowel syndrome)    Morbid obesity (HCC)    Psoriasis    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   26 mm Medtronic Evolut Pro transcatheter heart valve placed via percutaneous right transfemoral approach    Severe aortic stenosis    Sinus headache    Sleep apnea    "was told I do; never have had any problems w/it" (12/06/2017)   Type II diabetes mellitus (HCC)      Social History   Socioeconomic History   Marital status: Widowed    Spouse name: Not on file   Number of children: 3   Years of education: 66   Highest  education level: Some college, no degree  Occupational History   Occupation: Retired  Tobacco Use   Smoking status: Former    Current packs/day: 0.00    Average packs/day: 1.8 packs/day for 32.0 years (56.0 ttl pk-yrs)    Types: Cigarettes    Start date: 74    Quit date: 2002    Years since quitting: 23.0   Smokeless tobacco: Never  Vaping Use   Vaping status: Never Used  Substance and Sexual Activity   Alcohol use: No   Drug use: Never   Sexual activity: Not Currently  Other Topics Concern   Not on file  Social History Narrative   Not on file   Social Drivers of Health   Financial Resource Strain: Medium Risk (02/18/2023)   Overall Financial Resource Strain (CARDIA)    Difficulty of Paying Living Expenses: Somewhat hard  Food Insecurity: Patient Unable To Answer (02/11/2023)   Hunger Vital Sign    Worried About Running Out of Food in the Last Year: Patient unable to answer    Ran Out of Food in the Last Year: Patient unable to answer  Transportation Needs: No Transportation Needs (02/18/2023)   PRAPARE - Administrator, Civil Service (Medical): No    Lack of Transportation (Non-Medical): No  Physical Activity: Not on file  Stress: No Stress Concern Present (10/22/2022)   Received  from [facility] of Occupational Health - Occupational Stress Questionnaire    Feeling of Stress : Not at all  Social Connections: Socially Isolated (10/22/2022)   Received from [facility]   Social Connection and Isolation Panel [NHANES]    Frequency of Communication with Friends and Family: Twice a week    Frequency of Social Gatherings with Friends and Family: More than three times a week    Attends Religious Services: Never    Database administrator or Organizations: No    Attends Banker Meetings: Never    Marital Status: Widowed   Education Assessment and Provision:  Detailed education and instructions provided on heart failure  disease management including the following:  Signs and symptoms of Heart Failure When to call the physician Importance of daily weights Low sodium diet Fluid restriction Medication management Anticipated future follow-up appointments  Patient education given on each of the above topics.  Patient acknowledges understanding via teach back method and acceptance of all instructions.  Education Materials:  "Living Better With Heart Failure" Booklet, HF zone tool, & Daily Weight Tracker Tool.  Patient has scale at home: Yes Patient has pill box at home: Yes  High Risk Criteria for Readmission and/or Poor Patient Outcomes: Heart failure hospital admissions (last 6 months): 2  No Show rate: 3% Difficult social situation: No Demonstrates medication adherence: Yes Primary Language: English Literacy level: Reading, Writing, and Comprehension  Barriers of Care:   Diet & Fluid Restrictions Daily Weights Mediation Compliance  Considerations/Referrals:   Referral made to Heart Failure Pharmacist Stewardship: [place], [facility] has consulted with patient. Referral made to Heart Failure CSW/NCM TOC: No Referral made to Heart & Vascular TOC clinic: Yes- 03/01/2023 @ 2:00 PM-Pt aware of date, time and location.  Items for Follow-up on DC/TOC: Diet & Fluid Restrictions Daily Weights Medication Compliance Continued Heart Failure Education & importance of follow-up appointments   Roxy Horseman, RN, BSN [facility] Heart Failure Navigator Secure Chat Only

## 2023-02-18 NOTE — Progress Notes (Signed)
   Patient Name: [name] Date of Encounter: 02/18/2023 [facility] Cardiologist: Tonny Bollman, MD   Interval Summary  .    Patient seen on AM rounds. Denies any chest pain or shortness of breath. No over night events recorded.   Vital Signs .    Vitals:   02/17/23 2020 02/17/23 2346 02/18/23 0413 02/18/23 0750  BP: 103/82 (!) 111/49 127/67 135/71  Pulse: 69 69 80 76  Resp: 20 20 20    Temp: 97.8 F (36.6 C) 98 F (36.7 C) 97.6 F (36.4 C)   TempSrc:      SpO2: 98% 100% 100% 100%  Weight:      Height:        Intake/Output Summary (Last 24 hours) at 02/18/2023 0855 Last data filed at 02/18/2023 0417 Gross per 24 hour  Intake 363 ml  Output 1000 ml  Net -637 ml      02/15/2023    8:48 PM 02/15/2023    5:00 AM 02/11/2023    5:00 PM  Last 3 Weights  Weight (lbs) 280 lb 283 lb 4.7 oz 290 lb 2 oz  Weight (kg) 127.007 kg 128.5 kg 131.6 kg      Telemetry/ECG    Sinus 1st degree AVB, RBBB, rates in the 60's - Personally Reviewed  Physical Exam .   GEN: No acute distress.   Neck: Unable to assess JVD due to body habitus Cardiac: RRR, II/VI systolic murmur at the upper sternal borders without rubs or gallops.  Respiratory: Clear with diminished bases to auscultation bilaterally.Respirations are unlabored at rest on 1L of O2 via [place] GI: Soft, nontender, obese, non-distended  MS: No edema  Assessment & Plan .     Acute respiratory failure/sepsis -Admitted with respiratory failure requiring intubation, PEA arrest and septic shock -Treatment for bilateral lower extremity cellulitis and UTI -Successfully extubated on 02/12/2023 -Supportive care -Continue management per IM  PEA arrest/hypotension -In the setting of acute respiratory failure -Remains off of pressors and hemodynamically stable -Vital signs per unit protocol  Acute HFrEF -New LV dysfunction noted on echocardiogram with an EF of 30 to 35% with global hypokinesis -Cardiac  catheterization completed 02/17/2023 revealed mild nonobstructive coronary artery disease -LVEDP 14 mmHg -Continued on oral furosemide and carvedilol -Starting low-dose ACE inhibitor today -Continue to escalate GDMT as tolerated by kidney function and blood pressure -Heart failure education -Daily weights, I's and O's, low-sodium diet  Elevated high-sensitivity troponin -Supply/demand mismatch -Cardiac catheterization revealed no evidence of obstructive disease  AKI -Continue up to 1.53 on 12/22 -Continued to improve -Serum creatinine 1.31 this morning -Monitor urine output -Daily BMP -Monitor/trend/replete electrolytes as needed -Avoid nephrotoxic agents were able  Hyperlipidemia -Continue on statin therapy  Aortic stenosis status post TAVR (2019) -Echocardiogram this admission with mild perivalvular leak and mild to moderate AAS -Not likely contributing factor to current presentation  For questions or updates, please contact [facility] Please consult [email or web address] for contact info under        Signed, Katty Fretwell, NP

## 2023-02-18 NOTE — Progress Notes (Signed)
Occupational Therapy Treatment Patient Details Name: [name] MRN: [id number] DOB: [birth year] Today's Date: 02/18/2023   History of present illness Pt is an 82 y/o F admitted on 02/11/23. Pt was d/c earlier in Dec. 2024 after being treated for BLE cellulitis & L toe infected ulcer with underlying fx of phalanx & osteomyelitis. Pt was on the way to the hospital for an appointment & found to be hypoxic. Pt required intubation for severe hypoxemia & developed circulatory shock. Pt was extubated 02/12/23. 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 requires maxA LE dressing. Pt. education was provided about A/E use for LE dressing. Pt. education was provided about energy conservation, and work simplification techniques for basic ADLs. Pt. was provided with a visual handout. Pt.'s SpO2 87-91% on 1LO2. Pt. continues to benefit from OT services for ADL training, A/E training, and pt. education about energy conservation, and work simplification techniques home modification, and DME. OT discharge recommendations remain appropriate.      If plan is discharge home, recommend the following:  A lot of help with walking and/or transfers;A lot of help with bathing/dressing/bathroom;Assistance with cooking/housework;Assist for transportation;Help with stairs or ramp for entrance;Direct supervision/assist for medications management;Supervision due to cognitive status   Equipment Recommendations       Recommendations for Other Services      Precautions / Restrictions Precautions Precautions: Fall Restrictions Weight Bearing Restrictions Per Provider Order: Yes LLE Weight Bearing Per Provider Order: Weight bearing as tolerated       Mobility Bed Mobility               General bed mobility comments: Pt. sitting up on the side of the bed  eating upon arrival    Transfers                         Balance                                           ADL either performed or assessed with clinical judgement   ADL                       Lower Body Dressing: Maximal assistance                      Extremity/Trunk Assessment Upper Extremity Assessment Upper Extremity Assessment: Generalized weakness            Vision Patient Visual Report: No change from baseline     Perception     Praxis      Cognition Arousal: Alert Behavior During Therapy: [facility] for tasks assessed/performed                                            Exercises      Shoulder Instructions       General Comments      Pertinent Vitals/ Pain       Pain Assessment Pain Assessment: No/denies pain  Home Living  Prior Functioning/Environment              Frequency  Min 1X/week        Progress Toward Goals  OT Goals(current goals can now be found in the care plan section)  Progress towards OT goals: Progressing toward goals  Acute Rehab OT Goals Patient Stated Goal: Regian independence OT Goal Formulation: With patient Time For Goal Achievement: 02/28/23 Potential to Achieve Goals: Good  Plan      Co-evaluation                 AM-PAC OT "6 Clicks" Daily Activity     Outcome Measure   Help from another person eating meals?: None Help from another person taking care of personal grooming?: A Little Help from another person toileting, which includes using toliet, bedpan, or urinal?: A Lot Help from another person bathing (including washing, rinsing, drying)?: A Lot Help from another person to put on and taking off regular upper body clothing?: A Little Help from another person to put on and taking off regular lower body clothing?: A Lot 6 Click Score: 16    End of Session Equipment Utilized  During Treatment: Oxygen  OT Visit Diagnosis: Other abnormalities of gait and mobility (R26.89);Muscle weakness (generalized) (M62.81)   Activity Tolerance Patient tolerated treatment well   Patient Left with call bell/phone within reach;Other (comment)   Nurse Communication          Time: 0454-0981 OT Time Calculation (min): 24 min  Charges: OT General Charges $OT Visit: 1 Visit OT Treatments $Self Care/Home Management : 23-37 mins  Olegario Messier, MS, OTR/L   Olegario Messier 02/18/2023, 3:44 PM

## 2023-02-18 NOTE — Progress Notes (Signed)
Progress Note Patient: [name] RUE:[id number] DOB: [birth year] DOA: 02/11/2023     7 DOS: the patient was seen and examined on 02/18/2023   Brief hospital course: 82 year old female with a history of Essential hypertension, heart failure with preserved LVEF, Severe AS s/p TAVR, hypertension, diabetes type 2, diabetic ulcers, fatty liver disease, GERD, chronic dysphagia, history of colon polyps, was admitted and discharged earlier in dec 2024 with b/l LE cellulits and left toe infected ulcer with underlying fracture of phalanx and osteomyelitis.  Presented to ED with hypoxia and was intubated in ED. CPR was performed and obtained ROSC.  Admitted to ICU and required pressor support from presumed septic shock following cardiopulmonary arrest in the ED. TRH assumed care 02/14/23. Cardiology following and treating with IV diuretics. She is slowly weaning her oxygen requirement. LHC on 12/26.   Assessment and Plan:  Acute respiratory failure with hypoxia - intubated 12/20. extubated 12/21 Presumed septic shock - POA, shock physiology is resolved. Blood cultures negative. BAL culture normal respiratory flora Has now weaned to 1-2L.  - continue to wean - PT/OT  Cardiopulmonary arrest s/p CPR and ROSC Acute on Chronic HFrEF Hypertension - presented in shock Elevated troponin - demand ischemia.  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 Echo EF 30-35% (prior EF 45% in Aug 2024 at [facility]) Completed 48 hours IV heparin --Cardiology is following --Transitioned from IV Lasix 40 mg BID >> Lasix 40 mg PO BID --Low dose Coreg added --Additional GDMT per Cardiology as BP and renal function permit --Strict Io's and daily weights --Monitor renal function, electrolytes --Supplement O2, maintain sats > 90%, wean as tolerated --Pulmonary hygiene - titrating back on antihypertensives.   Musculoskeletal chest pain - status post CPR --Pain control PRN, per orders  UTI - E coli, Cellulitis- s/p  antibiotic course.  - Remove external cath.   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 --Sliding scale Novolog.   Stage II CKD  --Monitor renal function closely with diuresis --Avoid nephrotoxins and hypotension --Renally dose meds  Hyperlipemia --Lipitor 40 mg   Fibromyalgia No acute issues.  Morbid obesity (HCC) Body mass index is 48.02 kg/m. Complicates overall care and prognosis.  Recommend lifestyle modifications including physical activity and diet for weight loss and overall long-term health.   NAFLD (nonalcoholic fatty liver disease) No acute issues.     Subjective: Pt reports doing better today. Still feels tired and SOB with talking or movement.   Physical Exam: Vitals:   02/17/23 2020 02/17/23 2346 02/18/23 0413 02/18/23 0750  BP: 103/82 (!) 111/49 127/67 135/71  Pulse: 69 69 80 76  Resp: 20 20 20    Temp: 97.8 F (36.6 C) 98 F (36.7 C) 97.6 F (36.4 C)   TempSrc:      SpO2: 98% 100% 100% 100%  Weight:      Height:       General exam: awake, alert, no acute distress HEENT: moist mucus membranes, hearing grossly normal  Respiratory system: CTAB diminished bases due to low inspiratory volumes, no wheezes, rales or rhonchi, normal respiratory effort. Cardiovascular system: normal S1/S2, RRR, improving BLE edema.   Gastrointestinal system: soft, NT, ND, no HSM felt, +bowel sounds. Central nervous system: A&O x 3. no gross focal neurologic deficits, normal speech Extremities: distal BLE's wrapped, normal tone Skin: dry, intact, normal temperature  Psychiatry: normal mood, congruent affect, judgement and insight appear normal  Data Reviewed:     Latest Ref Rng & Units 02/18/2023    5:18 AM 02/17/2023    8:57 AM 02/16/2023    4:30 AM   CBC  WBC 4.0 - 10.5 K/uL 8.1   8.2   Hemoglobin 12.0 - 15.0 g/dL 9.9  40.9  9.5   Hematocrit 36.0 - 46.0 % 31.8  31.0  31.2   Platelets 150 - 400 K/uL 150   150       Latest Ref Rng & Units 02/18/2023    5:18 AM 02/17/2023    8:57 AM 02/16/2023    4:30 AM  BMP  Glucose 70 - 99 mg/dL 811   914   BUN 8 - 23 mg/dL 39   25   Creatinine 7.82 - 1.00 mg/dL 9.56   2.13   Sodium 086 - 145 mmol/L 139  140  144   Potassium 3.5 - 5.1 mmol/L 4.2  4.4  4.7   Chloride 98 - 111 mmol/L 97   106   CO2 22 - 32 mmol/L 31   31   Calcium 8.9 - 10.3 mg/dL 8.7   8.6    Family Communication: None present at bedside  Disposition: Status is: Inpatient Remains inpatient appropriate because: ongoing evaluation, IV therapies, needs clearance by consultants for d/c.   Needs SNF placement   Planned Discharge Destination: Skilled nursing facility     Time spent: 38 minutes  Author: Leeroy Bock, MD 02/18/2023 8:17 AM  For on call review [email or web address].

## 2023-02-18 NOTE — Plan of Care (Signed)

## 2023-02-19 ENCOUNTER — Other Ambulatory Visit: Payer: Self-pay

## 2023-02-19 DIAGNOSIS — I468 Cardiac arrest due to other underlying condition: Secondary | ICD-10-CM

## 2023-02-19 DIAGNOSIS — I2510 Atherosclerotic heart disease of native coronary artery without angina pectoris: Secondary | ICD-10-CM

## 2023-02-19 DIAGNOSIS — J9602 Acute respiratory failure with hypercapnia: Secondary | ICD-10-CM

## 2023-02-19 DIAGNOSIS — J989 Respiratory disorder, unspecified: Secondary | ICD-10-CM

## 2023-02-19 DIAGNOSIS — I2583 Coronary atherosclerosis due to lipid rich plaque: Secondary | ICD-10-CM

## 2023-02-19 DIAGNOSIS — J9601 Acute respiratory failure with hypoxia: Secondary | ICD-10-CM | POA: Diagnosis not present

## 2023-02-19 DIAGNOSIS — I469 Cardiac arrest, cause unspecified: Secondary | ICD-10-CM | POA: Diagnosis not present

## 2023-02-19 DIAGNOSIS — R7989 Other specified abnormal findings of blood chemistry: Secondary | ICD-10-CM

## 2023-02-19 DIAGNOSIS — I420 Dilated cardiomyopathy: Secondary | ICD-10-CM

## 2023-02-19 LAB — GLUCOSE, CAPILLARY
Glucose-Capillary: 136 mg/dL — ABNORMAL HIGH (ref 70–99)
Glucose-Capillary: 107 mg/dL — ABNORMAL HIGH (ref 70–99)
Glucose-Capillary: 233 mg/dL — ABNORMAL HIGH (ref 70–99)
Glucose-Capillary: 202 mg/dL — ABNORMAL HIGH (ref 70–99)
Glucose-Capillary: 144 mg/dL — ABNORMAL HIGH (ref 70–99)

## 2023-02-19 MED ORDER — INSULIN ASPART 100 UNIT/ML IJ SOLN
0.0000 [IU] | Freq: Three times a day (TID) | INTRAMUSCULAR | Status: DC
  Administered 2023-02-19 (×2): 5 [IU] via SUBCUTANEOUS
  Administered 2023-02-20 (×2): 3 [IU] via SUBCUTANEOUS
  Administered 2023-02-20 – 2023-02-21 (×3): 2 [IU] via SUBCUTANEOUS
  Administered 2023-02-21: 3 [IU] via SUBCUTANEOUS
  Administered 2023-02-22: 2 [IU] via SUBCUTANEOUS
  Administered 2023-02-22 – 2023-02-23 (×3): 3 [IU] via SUBCUTANEOUS
  Administered 2023-02-23: 2 [IU] via SUBCUTANEOUS
  Administered 2023-02-23: 3 [IU] via SUBCUTANEOUS
  Administered 2023-02-24: 2 [IU] via SUBCUTANEOUS
  Administered 2023-02-24 – 2023-02-25 (×3): 3 [IU] via SUBCUTANEOUS
  Administered 2023-02-25: 2 [IU] via SUBCUTANEOUS
  Administered 2023-02-26: 5 [IU] via SUBCUTANEOUS
  Administered 2023-02-26: 3 [IU] via SUBCUTANEOUS
  Administered 2023-02-27: 5 [IU] via SUBCUTANEOUS
  Administered 2023-02-27: 3 [IU] via SUBCUTANEOUS
  Administered 2023-02-28 (×2): 2 [IU] via SUBCUTANEOUS
  Administered 2023-02-28: 5 [IU] via SUBCUTANEOUS
  Administered 2023-03-01: 3 [IU] via SUBCUTANEOUS
  Administered 2023-03-02: 2 [IU] via SUBCUTANEOUS
  Filled 2023-02-19 (×27): qty 1

## 2023-02-19 NOTE — Progress Notes (Signed)
Progress Note Patient: [name] YNW:[id number] DOB: [birth year] DOA: 02/11/2023     8 DOS: the patient was seen and examined on 02/19/2023   Brief hospital course: 82 year old female with a history of Essential hypertension, heart failure with preserved LVEF, Severe AS s/p TAVR, hypertension, diabetes type 2, diabetic ulcers, fatty liver disease, GERD, chronic dysphagia, history of colon polyps, was admitted and discharged earlier in dec 2024 with b/l LE cellulits and left toe infected ulcer with underlying fracture of phalanx and osteomyelitis.  Presented to ED with hypoxia and was intubated in ED. CPR was performed and obtained ROSC.  Admitted to ICU and required pressor support from presumed septic shock following cardiopulmonary arrest in the ED. TRH assumed care 02/14/23. Cardiology following and treating with IV diuretics. She is slowly weaning her oxygen requirement. LHC on 12/26.   At present, she is on PO diuretics and stable ORA. Awaiting dc to SNF.   Assessment and Plan: Acute respiratory failure with hypoxia - intubated 12/20. extubated 12/21 Presumed septic shock - POA, shock physiology is resolved. Blood cultures negative. BAL culture normal respiratory flora. S/p cefuroxime, doxycycline, cefepime, ceftriaxone, azithromycin, vancomycin at different points in her stay Has now weaned to room air and completed Abx course - continue PT/OT  Cardiopulmonary arrest s/p CPR and ROSC Acute on Chronic HFrEF Hypertension - presented in shock Elevated troponin - demand ischemia.  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 Echo EF 30-35% (prior EF 45% in Aug 2024 at [facility]) Completed 48 hours IV heparin --Cardiology signed off now --continue Lasix 40 mg PO BID --Strict Io's and daily weights --Monitor renal function, electrolytes --Supplement O2, maintain sats > 90%, wean as tolerated --Pulmonary hygiene - titrating back on antihypertensives. - losartan,  carvedilol  Musculoskeletal chest pain - status post CPR --Pain control PRN, per orders  UTI - E coli, Cellulitis- s/p antibiotic course. 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 --Sliding scale Novolog.   Stage II CKD  --Monitor renal function closely with diuresis --Avoid nephrotoxins and hypotension --Renally dose meds  Hyperlipemia --Lipitor 40 mg   Fibromyalgia No acute issues.  Morbid obesity (HCC) Body mass index is 48.02 kg/m. Complicates overall care and prognosis.  Recommend lifestyle modifications including physical activity and diet for weight loss and overall long-term health.   NAFLD (nonalcoholic fatty liver disease) No acute issues.     Subjective: Pt reports doing great today. No SOB or chest pain. Awaiting SNF  Physical Exam: Vitals:   02/18/23 2329 02/19/23 0328 02/19/23 0500 02/19/23 0736  BP: (!) 114/40 (!) 118/91  (!) 123/46  Pulse: 65 64  66  Resp: 16 16  20   Temp: 97.7 F (36.5 C) 97.7 F (36.5 C)  97.9 F (36.6 C)  TempSrc: Oral Oral  Oral  SpO2: 96% 96%  93%  Weight:   128.2 kg   Height:       General exam: awake, alert, no acute distress HEENT: moist mucus membranes, hearing grossly normal  Respiratory system: CTAB diminished bases due to low inspiratory volumes, no wheezes, rales or rhonchi, normal respiratory effort. Cardiovascular system: normal S1/S2, RRR, improving BLE edema.   Gastrointestinal system: soft, NT, ND, no HSM felt, +bowel sounds. Central nervous system: A&O x 3. no gross focal neurologic deficits, normal speech Extremities: distal BLE's wrapped.  Non-pitting LE edema, normal tone Skin: dry, intact, normal temperature Psychiatry: normal mood, congruent affect, judgement and insight  appear normal  Data Reviewed:     Latest Ref Rng & Units 02/18/2023    5:18 AM 02/17/2023    8:57 AM 02/16/2023    4:30 AM  CBC  WBC 4.0 - 10.5 K/uL 8.1   8.2   Hemoglobin 12.0 - 15.0 g/dL 9.9  56.3  9.5   Hematocrit 36.0 - 46.0 % 31.8  31.0  31.2   Platelets 150 - 400 K/uL 150   150       Latest Ref Rng & Units 02/18/2023    5:18 AM 02/17/2023    8:57 AM 02/16/2023    4:30 AM  BMP  Glucose 70 - 99 mg/dL 875   643   BUN 8 - 23 mg/dL 39   25   Creatinine 3.29 - 1.00 mg/dL 5.18   8.41   Sodium 660 - 145 mmol/L 139  140  144   Potassium 3.5 - 5.1 mmol/L 4.2  4.4  4.7   Chloride 98 - 111 mmol/L 97   106   CO2 22 - 32 mmol/L 31   31   Calcium 8.9 - 10.3 mg/dL 8.7   8.6    Family Communication: None present at bedside  Disposition: Status is: Inpatient Remains inpatient appropriate because: Needs SNF placement   Planned Discharge Destination: Skilled nursing facility     Time spent: 38 minutes  Author: Leeroy Bock, MD 02/19/2023 8:27 AM  For on call review [email or web address].

## 2023-02-19 NOTE — Plan of Care (Signed)
  Problem: Education: Goal: Knowledge of General Education information will improve Description: Including pain rating scale, medication(s)/side effects and non-pharmacologic comfort measures Outcome: Progressing   Problem: Clinical Measurements: Goal: Ability to maintain clinical measurements within normal limits will improve Outcome: Progressing Goal: Will remain free from infection Outcome: Progressing Goal: Diagnostic test results will improve Outcome: Progressing Goal: Respiratory complications will improve Outcome: Progressing Goal: Cardiovascular complication will be avoided Outcome: Progressing   Problem: Activity: Goal: Risk for activity intolerance will decrease Outcome: Progressing   Problem: Nutrition: Goal: Adequate nutrition will be maintained Outcome: Progressing   Problem: Coping: Goal: Level of anxiety will decrease Outcome: Progressing   Problem: Elimination: Goal: Will not experience complications related to bowel motility Outcome: Progressing Goal: Will not experience complications related to urinary retention Outcome: Progressing   Problem: Pain Management: Goal: General experience of comfort will improve Outcome: Progressing   Problem: Safety: Goal: Ability to remain free from injury will improve Outcome: Progressing   Problem: Skin Integrity: Goal: Risk for impaired skin integrity will decrease Outcome: Progressing   Problem: Activity: Goal: Ability to tolerate increased activity will improve Outcome: Progressing   Problem: Respiratory: Goal: Ability to maintain a clear airway and adequate ventilation will improve Outcome: Progressing   Problem: Role Relationship: Goal: Method of communication will improve Outcome: Progressing   Problem: Coping: Goal: Ability to adjust to condition or change in health will improve Outcome: Progressing   Problem: Fluid Volume: Goal: Ability to maintain a balanced intake and output will  improve Outcome: Progressing   Problem: Metabolic: Goal: Ability to maintain appropriate glucose levels will improve Outcome: Progressing   Problem: Nutritional: Goal: Maintenance of adequate nutrition will improve Outcome: Progressing   Problem: Tissue Perfusion: Goal: Adequacy of tissue perfusion will improve Outcome: Progressing

## 2023-02-19 NOTE — Plan of Care (Signed)

## 2023-02-20 DIAGNOSIS — J989 Respiratory disorder, unspecified: Secondary | ICD-10-CM | POA: Diagnosis not present

## 2023-02-20 DIAGNOSIS — I2510 Atherosclerotic heart disease of native coronary artery without angina pectoris: Secondary | ICD-10-CM | POA: Diagnosis not present

## 2023-02-20 DIAGNOSIS — J9601 Acute respiratory failure with hypoxia: Secondary | ICD-10-CM | POA: Diagnosis not present

## 2023-02-20 DIAGNOSIS — I469 Cardiac arrest, cause unspecified: Secondary | ICD-10-CM | POA: Diagnosis not present

## 2023-02-20 LAB — GLUCOSE, CAPILLARY
Glucose-Capillary: 123 mg/dL — ABNORMAL HIGH (ref 70–99)
Glucose-Capillary: 167 mg/dL — ABNORMAL HIGH (ref 70–99)
Glucose-Capillary: 182 mg/dL — ABNORMAL HIGH (ref 70–99)
Glucose-Capillary: 155 mg/dL — ABNORMAL HIGH (ref 70–99)

## 2023-02-20 NOTE — Plan of Care (Signed)
  Problem: Education: Goal: Knowledge of General Education information will improve Description: Including pain rating scale, medication(s)/side effects and non-pharmacologic comfort measures Outcome: Progressing   Problem: Health Behavior/Discharge Planning: Goal: Ability to manage health-related needs will improve Outcome: Progressing   Problem: Clinical Measurements: Goal: Ability to maintain clinical measurements within normal limits will improve Outcome: Progressing Goal: Will remain free from infection Outcome: Progressing Goal: Diagnostic test results will improve Outcome: Progressing Goal: Respiratory complications will improve Outcome: Progressing Goal: Cardiovascular complication will be avoided Outcome: Progressing   Problem: Activity: Goal: Risk for activity intolerance will decrease Outcome: Progressing   Problem: Nutrition: Goal: Adequate nutrition will be maintained Outcome: Progressing   Problem: Coping: Goal: Level of anxiety will decrease Outcome: Progressing   Problem: Elimination: Goal: Will not experience complications related to bowel motility Outcome: Progressing Goal: Will not experience complications related to urinary retention Outcome: Progressing   Problem: Pain Management: Goal: General experience of comfort will improve Outcome: Progressing   Problem: Safety: Goal: Ability to remain free from injury will improve Outcome: Progressing   Problem: Skin Integrity: Goal: Risk for impaired skin integrity will decrease Outcome: Progressing   Problem: Activity: Goal: Ability to tolerate increased activity will improve Outcome: Progressing   Problem: Respiratory: Goal: Ability to maintain a clear airway and adequate ventilation will improve Outcome: Progressing   Problem: Role Relationship: Goal: Method of communication will improve Outcome: Progressing   Problem: Education: Goal: Ability to describe self-care measures that may  prevent or decrease complications (Diabetes Survival Skills Education) will improve Outcome: Progressing Goal: Individualized Educational Video(s) Outcome: Progressing   Problem: Coping: Goal: Ability to adjust to condition or change in health will improve Outcome: Progressing   Problem: Fluid Volume: Goal: Ability to maintain a balanced intake and output will improve Outcome: Progressing   Problem: Health Behavior/Discharge Planning: Goal: Ability to identify and utilize available resources and services will improve Outcome: Progressing Goal: Ability to manage health-related needs will improve Outcome: Progressing   Problem: Metabolic: Goal: Ability to maintain appropriate glucose levels will improve Outcome: Progressing   Problem: Nutritional: Goal: Maintenance of adequate nutrition will improve Outcome: Progressing Goal: Progress toward achieving an optimal weight will improve Outcome: Progressing   Problem: Skin Integrity: Goal: Risk for impaired skin integrity will decrease Outcome: Progressing   Problem: Tissue Perfusion: Goal: Adequacy of tissue perfusion will improve Outcome: Progressing   Problem: Education: Goal: Understanding of CV disease, CV risk reduction, and recovery process will improve Outcome: Progressing Goal: Individualized Educational Video(s) Outcome: Progressing   Problem: Activity: Goal: Ability to return to baseline activity level will improve Outcome: Progressing   Problem: Cardiovascular: Goal: Ability to achieve and maintain adequate cardiovascular perfusion will improve Outcome: Progressing Goal: Vascular access site(s) Level 0-1 will be maintained Outcome: Progressing   Problem: Health Behavior/Discharge Planning: Goal: Ability to safely manage health-related needs after discharge will improve Outcome: Progressing

## 2023-02-20 NOTE — Progress Notes (Signed)
Progress Note Patient: [name] ZOX:[id number] DOB: 20-Aug-1940 DOA: 02/11/2023     9 DOS: the patient was seen and examined on 02/20/2023   Brief hospital course: 82 year old female with a history of Essential hypertension, heart failure with preserved LVEF, Severe AS s/p TAVR, hypertension, diabetes type 2, diabetic ulcers, fatty liver disease, GERD, chronic dysphagia, history of colon polyps, was admitted and discharged earlier in dec 2024 with b/l LE cellulits and left toe infected ulcer with underlying fracture of phalanx and osteomyelitis.  Presented to ED with hypoxia and was intubated in ED. CPR was performed and obtained ROSC.  Admitted to ICU and required pressor support from presumed septic shock following cardiopulmonary arrest in the ED. TRH assumed care 02/14/23. Cardiology following and treating with IV diuretics. She is slowly weaning her oxygen requirement. LHC on 12/26.   At present, she is on PO diuretics and stable ORA. Cardiology has signed off. Awaiting dc to SNF based on PT/OT recs.   Assessment and Plan: Acute respiratory failure with hypoxia - intubated 12/20. extubated 12/21 Presumed septic shock - POA, shock physiology is resolved. Blood cultures negative. BAL culture normal respiratory flora. S/p cefuroxime, doxycycline, cefepime, ceftriaxone, azithromycin, vancomycin at different points in her stay Has now weaned to room air and completed Abx course - continue PT/OT  Cardiopulmonary arrest s/p CPR and ROSC Acute on Chronic HFrEF Hypertension - presented in shock Elevated troponin - demand ischemia.  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 Echo EF 30-35% (prior EF 45% in Aug 2024 at [facility]) Completed 48 hours IV heparin --Cardiology signed off now --continue Lasix 40 mg PO BID --Strict Io's and daily weights --Monitor renal function, electrolytes - titrating back on antihypertensives. - losartan, carvedilol  Musculoskeletal chest pain - status  post CPR --Pain control PRN, per orders  UTI - E coli, Cellulitis- s/p antibiotic course. 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 --Sliding scale Novolog.   Stage II CKD  --Monitor renal function closely with diuresis --Avoid nephrotoxins and hypotension --Renally dose meds  Hyperlipemia --Lipitor 40 mg   Fibromyalgia No acute issues.  Morbid obesity (HCC) Body mass index is 48.02 kg/m. Complicates overall care and prognosis.  Recommend lifestyle modifications including physical activity and diet for weight loss and overall long-term health.   NAFLD (nonalcoholic fatty liver disease) No acute issues.     Subjective: Pt reports doing great today. No SOB or chest pain. Awaiting SNF  Physical Exam: Vitals:   02/19/23 2349 02/20/23 0414 02/20/23 0800 02/20/23 0800  BP: (!) 125/50 (!) 128/53 (!) 133/53 (!) 133/53  Pulse: 65 63 61 61  Resp: 20 19 17    Temp: 98.2 F (36.8 C) 98.1 F (36.7 C) 97.6 F (36.4 C) 97.6 F (36.4 C)  TempSrc: Oral Oral Oral Oral  SpO2: 99% 97% 96% 95%  Weight:      Height:       General exam: awake, alert, no acute distress HEENT: moist mucus membranes, hearing grossly normal  Respiratory system: CTAB diminished bases due to low inspiratory volumes, no wheezes, rales or rhonchi, normal respiratory effort. Cardiovascular system: normal S1/S2, RRR, improving BLE edema.   Gastrointestinal system: soft, NT, ND, no HSM felt, +bowel sounds. Central nervous system: A&O x 3. no gross focal neurologic deficits, normal speech Extremities Non-pitting LE edema,  normal tone Skin: dry, intact, normal temperature Psychiatry: normal mood, congruent affect, judgement and insight appear normal  Data Reviewed:     Latest Ref  Rng & Units 02/18/2023    5:18 AM 02/17/2023    8:57 AM 02/16/2023    4:30 AM  CBC  WBC 4.0 - 10.5 K/uL 8.1   8.2   Hemoglobin 12.0 - 15.0 g/dL 9.9  40.9  9.5   Hematocrit 36.0 - 46.0 % 31.8  31.0  31.2   Platelets 150 - 400 K/uL 150   150       Latest Ref Rng & Units 02/18/2023    5:18 AM 02/17/2023    8:57 AM 02/16/2023    4:30 AM  BMP  Glucose 70 - 99 mg/dL 811   914   BUN 8 - 23 mg/dL 39   25   Creatinine 7.82 - 1.00 mg/dL 9.56   2.13   Sodium 086 - 145 mmol/L 139  140  144   Potassium 3.5 - 5.1 mmol/L 4.2  4.4  4.7   Chloride 98 - 111 mmol/L 97   106   CO2 22 - 32 mmol/L 31   31   Calcium 8.9 - 10.3 mg/dL 8.7   8.6    Family Communication: None present at bedside  Disposition: Status is: Inpatient Remains inpatient appropriate because: Needs SNF placement   Planned Discharge Destination: Skilled nursing facility     Time spent: 38 minutes  Author: Leeroy Bock, MD 02/20/2023 8:11 AM  For on call review [email or web address].

## 2023-02-20 NOTE — Progress Notes (Signed)
0730 patient alert to self and that's she is in hospital forgetful easy to reorient. Patient on room air.

## 2023-02-21 DIAGNOSIS — R579 Shock, unspecified: Secondary | ICD-10-CM | POA: Diagnosis not present

## 2023-02-21 LAB — GLUCOSE, CAPILLARY
Glucose-Capillary: 123 mg/dL — ABNORMAL HIGH (ref 70–99)
Glucose-Capillary: 124 mg/dL — ABNORMAL HIGH (ref 70–99)
Glucose-Capillary: 165 mg/dL — ABNORMAL HIGH (ref 70–99)

## 2023-02-21 MED ORDER — MELATONIN 5 MG PO TABS
5.0000 mg | ORAL_TABLET | Freq: Once | ORAL | Status: AC
  Administered 2023-02-21: 5 mg via ORAL
  Filled 2023-02-21: qty 1

## 2023-02-21 NOTE — Care Management Important Message (Signed)
Important Message  Patient Details  Name: [name] MRN: [id number] Date of Birth: [birth year]   Important Message Given:  Yes - Medicare IM     Cristela Blue, CMA 02/21/2023, 3:21 PM

## 2023-02-21 NOTE — Plan of Care (Signed)

## 2023-02-21 NOTE — Plan of Care (Signed)
  Problem: Education: Goal: Knowledge of General Education information will improve Description: Including pain rating scale, medication(s)/side effects and non-pharmacologic comfort measures Outcome: Progressing   Problem: Health Behavior/Discharge Planning: Goal: Ability to manage health-related needs will improve Outcome: Progressing   Problem: Clinical Measurements: Goal: Ability to maintain clinical measurements within normal limits will improve Outcome: Progressing Goal: Will remain free from infection Outcome: Progressing Goal: Diagnostic test results will improve Outcome: Progressing Goal: Respiratory complications will improve Outcome: Progressing Goal: Cardiovascular complication will be avoided Outcome: Progressing   Problem: Activity: Goal: Risk for activity intolerance will decrease Outcome: Progressing   Problem: Nutrition: Goal: Adequate nutrition will be maintained Outcome: Progressing   Problem: Coping: Goal: Level of anxiety will decrease Outcome: Progressing   Problem: Elimination: Goal: Will not experience complications related to bowel motility Outcome: Progressing Goal: Will not experience complications related to urinary retention Outcome: Progressing   Problem: Pain Management: Goal: General experience of comfort will improve Outcome: Progressing   Problem: Safety: Goal: Ability to remain free from injury will improve Outcome: Progressing   Problem: Skin Integrity: Goal: Risk for impaired skin integrity will decrease Outcome: Progressing   Problem: Activity: Goal: Ability to tolerate increased activity will improve Outcome: Progressing   Problem: Respiratory: Goal: Ability to maintain a clear airway and adequate ventilation will improve Outcome: Progressing   Problem: Education: Goal: Ability to describe self-care measures that may prevent or decrease complications (Diabetes Survival Skills Education) will improve Outcome:  Progressing Goal: Individualized Educational Video(s) Outcome: Progressing   Problem: Role Relationship: Goal: Method of communication will improve Outcome: Progressing   Problem: Coping: Goal: Ability to adjust to condition or change in health will improve Outcome: Progressing   Problem: Fluid Volume: Goal: Ability to maintain a balanced intake and output will improve Outcome: Progressing   Problem: Health Behavior/Discharge Planning: Goal: Ability to identify and utilize available resources and services will improve Outcome: Progressing Goal: Ability to manage health-related needs will improve Outcome: Progressing   Problem: Metabolic: Goal: Ability to maintain appropriate glucose levels will improve Outcome: Progressing   Problem: Nutritional: Goal: Maintenance of adequate nutrition will improve Outcome: Progressing Goal: Progress toward achieving an optimal weight will improve Outcome: Progressing   Problem: Skin Integrity: Goal: Risk for impaired skin integrity will decrease Outcome: Progressing   Problem: Tissue Perfusion: Goal: Adequacy of tissue perfusion will improve Outcome: Progressing   Problem: Education: Goal: Understanding of CV disease, CV risk reduction, and recovery process will improve Outcome: Progressing Goal: Individualized Educational Video(s) Outcome: Progressing   Problem: Activity: Goal: Ability to return to baseline activity level will improve Outcome: Progressing   Problem: Cardiovascular: Goal: Ability to achieve and maintain adequate cardiovascular perfusion will improve Outcome: Progressing Goal: Vascular access site(s) Level 0-1 will be maintained Outcome: Progressing   Problem: Health Behavior/Discharge Planning: Goal: Ability to safely manage health-related needs after discharge will improve Outcome: Progressing

## 2023-02-21 NOTE — Plan of Care (Signed)
  Problem: Education: Goal: Knowledge of General Education information will improve Description: Including pain rating scale, medication(s)/side effects and non-pharmacologic comfort measures Outcome: Progressing   Problem: Health Behavior/Discharge Planning: Goal: Ability to manage health-related needs will improve Outcome: Progressing   Problem: Clinical Measurements: Goal: Ability to maintain clinical measurements within normal limits will improve Outcome: Progressing Goal: Will remain free from infection Outcome: Progressing Goal: Diagnostic test results will improve Outcome: Progressing Goal: Respiratory complications will improve Outcome: Progressing Goal: Cardiovascular complication will be avoided Outcome: Progressing   Problem: Activity: Goal: Risk for activity intolerance will decrease Outcome: Progressing   Problem: Nutrition: Goal: Adequate nutrition will be maintained Outcome: Progressing   Problem: Coping: Goal: Level of anxiety will decrease Outcome: Progressing   Problem: Elimination: Goal: Will not experience complications related to bowel motility Outcome: Progressing Goal: Will not experience complications related to urinary retention Outcome: Progressing   Problem: Pain Management: Goal: General experience of comfort will improve Outcome: Progressing   Problem: Safety: Goal: Ability to remain free from injury will improve Outcome: Progressing   Problem: Skin Integrity: Goal: Risk for impaired skin integrity will decrease Outcome: Progressing   Problem: Activity: Goal: Ability to tolerate increased activity will improve Outcome: Progressing   Problem: Respiratory: Goal: Ability to maintain a clear airway and adequate ventilation will improve Outcome: Progressing   Problem: Role Relationship: Goal: Method of communication will improve Outcome: Progressing   Problem: Education: Goal: Ability to describe self-care measures that may  prevent or decrease complications (Diabetes Survival Skills Education) will improve Outcome: Progressing Goal: Individualized Educational Video(s) Outcome: Progressing   Problem: Coping: Goal: Ability to adjust to condition or change in health will improve Outcome: Progressing   Problem: Fluid Volume: Goal: Ability to maintain a balanced intake and output will improve Outcome: Progressing   Problem: Health Behavior/Discharge Planning: Goal: Ability to identify and utilize available resources and services will improve Outcome: Progressing Goal: Ability to manage health-related needs will improve Outcome: Progressing   Problem: Metabolic: Goal: Ability to maintain appropriate glucose levels will improve Outcome: Progressing   Problem: Nutritional: Goal: Maintenance of adequate nutrition will improve Outcome: Progressing Goal: Progress toward achieving an optimal weight will improve Outcome: Progressing   Problem: Skin Integrity: Goal: Risk for impaired skin integrity will decrease Outcome: Progressing   Problem: Tissue Perfusion: Goal: Adequacy of tissue perfusion will improve Outcome: Progressing   Problem: Education: Goal: Understanding of CV disease, CV risk reduction, and recovery process will improve Outcome: Progressing Goal: Individualized Educational Video(s) Outcome: Progressing   Problem: Activity: Goal: Ability to return to baseline activity level will improve Outcome: Progressing   Problem: Cardiovascular: Goal: Ability to achieve and maintain adequate cardiovascular perfusion will improve Outcome: Progressing Goal: Vascular access site(s) Level 0-1 will be maintained Outcome: Progressing   Problem: Health Behavior/Discharge Planning: Goal: Ability to safely manage health-related needs after discharge will improve Outcome: Progressing

## 2023-02-21 NOTE — Progress Notes (Signed)
Progress Note Patient: [name] GEX:[id number] DOB: [birth year] DOA: 02/11/2023     10 DOS: the patient was seen and examined on 02/21/2023   Brief hospital course: 82 year old female with a history of Essential hypertension, heart failure with preserved LVEF, Severe AS s/p TAVR, hypertension, diabetes type 2, diabetic ulcers, fatty liver disease, GERD, chronic dysphagia, history of colon polyps, was admitted and discharged earlier in dec 2024 with b/l LE cellulits and left toe infected ulcer with underlying fracture of phalanx and osteomyelitis.  Presented to ED with hypoxia and was intubated in ED. CPR was performed and obtained ROSC.  Admitted to ICU and required pressor support from presumed septic shock following cardiopulmonary arrest in the ED. TRH assumed care 02/14/23. Cardiology following and treating with IV diuretics. She is slowly weaning her oxygen requirement. LHC on 12/26.   At present, she is on PO diuretics and stable ORA. Cardiology has signed off. Awaiting dc to SNF based on PT/OT recs.   Assessment and Plan: Acute respiratory failure with hypoxia - intubated 12/20. extubated 12/21 Presumed septic shock - POA, shock physiology is resolved. Blood cultures negative. BAL culture normal respiratory flora. S/p cefuroxime, doxycycline, cefepime, ceftriaxone, azithromycin, vancomycin at different points in her stay Has now weaned to room air and completed Abx course - continue PT/OT  Cardiopulmonary arrest s/p CPR and ROSC Acute on Chronic HFrEF Hypertension - presented in shock Elevated troponin - demand ischemia.  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 Echo EF 30-35% (prior EF 45% in Aug 2024 at [facility]) Completed 48 hours IV heparin --Cardiology signed off now --continue Lasix 40 mg PO BID --Strict Io's and daily weights --Monitor renal function, electrolytes - titrating back on antihypertensives. - losartan, carvedilol  Musculoskeletal chest pain - status  post CPR --Pain control PRN, per orders  UTI - E coli, Cellulitis- s/p antibiotic course. 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 --Sliding scale Novolog.   Stage II CKD  --Monitor renal function closely with diuresis --Avoid nephrotoxins and hypotension --Renally dose meds  Hyperlipemia --Lipitor 40 mg   Fibromyalgia No acute issues.  Morbid obesity (HCC) Body mass index is 48.02 kg/m. Complicates overall care and prognosis.  Recommend lifestyle modifications including physical activity and diet for weight loss and overall long-term health.   NAFLD (nonalcoholic fatty liver disease) No acute issues.     Subjective: No significant overnight events, patient was resting comfortably on the recliner.  Stated that she is just feeling weak and tired, no any specific complaints.  Denied any chest pain or palpitation, no shortness of breath.  Awaiting for SNF placement.   Physical Exam: Vitals:   02/21/23 0817 02/21/23 1117 02/21/23 1457 02/21/23 1458  BP:  (!) 125/59 90/66 (!) 90/48  Pulse:  (!) 59  71  Resp:  18  18  Temp: (!) 97.5 F (36.4 C) 97.7 F (36.5 C)  97.9 F (36.6 C)  TempSrc: Oral     SpO2:  94%  97%  Weight:      Height:       General exam: awake, alert, no acute distress HEENT: moist mucus membranes, hearing grossly normal  Respiratory system: CTAB diminished bases due to low inspiratory volumes, no wheezes, rales or rhonchi, normal respiratory effort. Cardiovascular system: normal S1/S2, RRR, improving BLE edema.  Gastrointestinal system: soft, NT, ND, no HSM felt, +bowel sounds. Central nervous system: A&O x 3. no gross focal neurologic deficits, normal speech Extremities Non-pitting LE edema, normal tone Skin: dry,  intact, normal temperature Psychiatry: normal mood, congruent affect, judgement and insight appear normal  Data Reviewed:     Latest Ref Rng & Units 02/18/2023    5:18 AM 02/17/2023    8:57 AM 02/16/2023    4:30 AM  CBC  WBC 4.0 - 10.5 K/uL 8.1   8.2   Hemoglobin 12.0 - 15.0 g/dL 9.9  65.7  9.5   Hematocrit 36.0 - 46.0 % 31.8  31.0  31.2   Platelets 150 - 400 K/uL 150   150       Latest Ref Rng & Units 02/18/2023    5:18 AM 02/17/2023    8:57 AM 02/16/2023    4:30 AM  BMP  Glucose 70 - 99 mg/dL 846   962   BUN 8 - 23 mg/dL 39   25   Creatinine 9.52 - 1.00 mg/dL 8.41   3.24   Sodium 401 - 145 mmol/L 139  140  144   Potassium 3.5 - 5.1 mmol/L 4.2  4.4  4.7   Chloride 98 - 111 mmol/L 97   106   CO2 22 - 32 mmol/L 31   31   Calcium 8.9 - 10.3 mg/dL 8.7   8.6    Family Communication: None present at bedside  Disposition: Status is: Inpatient Remains inpatient appropriate because: Needs SNF placement   Planned Discharge Destination: Skilled nursing facility     Time spent: 40 minutes  Author: Gillis Santa, MD 02/21/2023 4:08 PM  For on call review [email or web address].

## 2023-02-21 NOTE — Progress Notes (Signed)
Physical Therapy Treatment Patient Details Name: [name] MRN: [id number] DOB: 12/04/1940 Today's Date: 02/21/2023   History of Present Illness Pt is an 82 y/o F admitted on 02/11/23. Pt was d/c earlier in Dec. 2024 after being treated for BLE cellulitis & L toe infected ulcer with underlying fx of phalanx & osteomyelitis. Pt was on the way to the hospital for an appointment & found to be hypoxic. Pt required intubation for severe hypoxemia & developed circulatory shock. Pt was extubated 02/12/23. PMH: essential HTN, HF with preserved LVEF, severe AS s/p TAVR, HTN, DM2, diabetic ulcers, fatty liver disease, GERD, chronic dysphagia, colon polyps, L great toe amputation 01/24/23    PT Comments  Pt was pleasant and motivated to participate during the session and put forth good effort throughout. Pt required extra time and effort with functional tasks per below but required no physical assistance during the session.  Pt able to increase self-selected max amb distance to 20 feet this session and ambulated with no overt LOB or adverse symptoms.  Pt's SpO2 and HR monitored frequently during the session and remained WNL on room air throughout.  Pt will benefit from continued PT services upon discharge to safely address deficits listed in patient problem list for decreased caregiver assistance and eventual return to PLOF.      If plan is discharge home, recommend the following: Assistance with cooking/housework;Assist for transportation;Help with stairs or ramp for entrance;A little help with walking and/or transfers;A little help with bathing/dressing/bathroom   Can travel by private vehicle     Yes  Equipment Recommendations  Other (comment) (TBD at next venue of care)    Recommendations for Other Services       Precautions / Restrictions Precautions Precautions: Fall Restrictions Weight Bearing Restrictions Per Provider Order: Yes LLE Weight Bearing Per Provider Order: Weight bearing as  tolerated     Mobility  Bed Mobility Overal bed mobility: Needs Assistance Bed Mobility: Supine to Sit     Supine to sit: Supervision     General bed mobility comments: Min extra time and effort only    Transfers Overall transfer level: Needs assistance Equipment used: Rolling walker (2 wheels) Transfers: Sit to/from Stand Sit to Stand: Contact guard assist           General transfer comment: Min verbal cues for hand placement with extra time and effort to come to standing but no physical assist required    Ambulation/Gait Ambulation/Gait assistance: Contact guard assist Gait Distance (Feet): 20 Feet Assistive device: Rolling walker (2 wheels) Gait Pattern/deviations: Step-through pattern, Decreased step length - right, Decreased step length - left, Trunk flexed Gait velocity: decreased     General Gait Details: Slow cadence with short B step length but generally steady with no overt LOB   Stairs             Wheelchair Mobility     Tilt Bed    Modified Rankin (Stroke Patients Only)       Balance Overall balance assessment: Needs assistance Sitting-balance support: Feet supported Sitting balance-Leahy Scale: Good     Standing balance support: Bilateral upper extremity supported, During functional activity, Reliant on assistive device for balance Standing balance-Leahy Scale: Fair                              Cognition Arousal: Alert Behavior During Therapy: WFL for tasks assessed/performed Overall Cognitive Status: History of cognitive impairments - at  baseline                                          Exercises Other Exercises Other Exercises: Pt education provided on physiological benefits of activity and OOB to chair    General Comments        Pertinent Vitals/Pain Pain Assessment Pain Assessment: No/denies pain    Home Living                          Prior Function            PT  Goals (current goals can now be found in the care plan section) Progress towards PT goals: Progressing toward goals    Frequency    Min 1X/week      PT Plan      Co-evaluation              AM-PAC PT "6 Clicks" Mobility   Outcome Measure  Help needed turning from your back to your side while in a flat bed without using bedrails?: A Little Help needed moving from lying on your back to sitting on the side of a flat bed without using bedrails?: A Little Help needed moving to and from a bed to a chair (including a wheelchair)?: A Little Help needed standing up from a chair using your arms (e.g., wheelchair or bedside chair)?: A Little Help needed to walk in hospital room?: A Little Help needed climbing 3-5 steps with a railing? : A Lot 6 Click Score: 17    End of Session Equipment Utilized During Treatment: Gait belt Activity Tolerance: Patient tolerated treatment well Patient left: in chair;with call bell/phone within reach;with chair alarm set Nurse Communication: Mobility status PT Visit Diagnosis: Other abnormalities of gait and mobility (R26.89);Muscle weakness (generalized) (M62.81);Difficulty in walking, not elsewhere classified (R26.2)     Time: 4132-4401 PT Time Calculation (min) (ACUTE ONLY): 17 min  Charges:    $Gait Training: 8-22 mins PT General Charges $$ ACUTE PT VISIT: 1 Visit                     D. Scott Brytnee Bechler PT, DPT 02/21/23, 12:13 PM

## 2023-02-22 DIAGNOSIS — R579 Shock, unspecified: Secondary | ICD-10-CM | POA: Diagnosis not present

## 2023-02-22 LAB — BASIC METABOLIC PANEL
Anion gap: 6 (ref 5–15)
BUN: 52 mg/dL — ABNORMAL HIGH (ref 8–23)
CO2: 31 mmol/L (ref 22–32)
Calcium: 8.7 mg/dL — ABNORMAL LOW (ref 8.9–10.3)
Chloride: 101 mmol/L (ref 98–111)
Creatinine, Ser: 1.38 mg/dL — ABNORMAL HIGH (ref 0.44–1.00)
GFR, Estimated: 39 mL/min — ABNORMAL LOW (ref >60)
Glucose, Bld: 154 mg/dL — ABNORMAL HIGH (ref 70–99)
Potassium: 4.4 mmol/L (ref 3.5–5.1)
Sodium: 138 mmol/L (ref 135–145)

## 2023-02-22 LAB — CBC
HCT: 31.6 % — ABNORMAL LOW (ref 36.0–46.0)
Hemoglobin: 9.8 g/dL — ABNORMAL LOW (ref 12.0–15.0)
MCH: 28.1 pg (ref 26.0–34.0)
MCHC: 31 g/dL (ref 30.0–36.0)
MCV: 90.5 fL (ref 80.0–100.0)
Platelets: 145 10*3/uL — ABNORMAL LOW (ref 150–400)
RBC: 3.49 MIL/uL — ABNORMAL LOW (ref 3.87–5.11)
RDW: 15.9 % — ABNORMAL HIGH (ref 11.5–15.5)
WBC: 7 10*3/uL (ref 4.0–10.5)
nRBC: 0 % (ref 0.0–0.2)

## 2023-02-22 LAB — GLUCOSE, CAPILLARY
Glucose-Capillary: 129 mg/dL — ABNORMAL HIGH (ref 70–99)
Glucose-Capillary: 172 mg/dL — ABNORMAL HIGH (ref 70–99)
Glucose-Capillary: 160 mg/dL — ABNORMAL HIGH (ref 70–99)

## 2023-02-22 LAB — MAGNESIUM: Magnesium: 2.4 mg/dL (ref 1.7–2.4)

## 2023-02-22 LAB — PHOSPHORUS: Phosphorus: 4.5 mg/dL (ref 2.5–4.6)

## 2023-02-22 NOTE — Progress Notes (Signed)
 Progress Note Patient: [name] FMW:[id number] DOB: 1940-06-29 DOA: 02/11/2023     11 DOS: the patient was seen and examined on 02/22/2023   Brief hospital course: 82 year old female with a history of Essential hypertension, heart failure with preserved LVEF, Severe AS s/p TAVR, hypertension, diabetes type 2, diabetic ulcers, fatty liver disease, GERD, chronic dysphagia, history of colon polyps, was admitted and discharged earlier in dec 2024 with b/l LE cellulits and left toe infected ulcer with underlying fracture of phalanx and osteomyelitis.  Presented to ED with hypoxia and was intubated in ED. CPR was performed and obtained ROSC.  Admitted to ICU and required pressor support from presumed septic shock following cardiopulmonary arrest in the ED. TRH assumed care 02/14/23. Cardiology following and treating with IV diuretics. She is slowly weaning her oxygen requirement. LHC on 12/26.   At present, she is on PO diuretics and stable ORA. Cardiology has signed off. Awaiting dc to SNF based on PT/OT recs.   Assessment and Plan: Acute respiratory failure with hypoxia - intubated 12/20. extubated 12/21 Presumed septic shock - POA, shock physiology is resolved. Blood cultures negative. BAL culture normal respiratory flora. S/p cefuroxime , doxycycline , cefepime , ceftriaxone , azithromycin , vancomycin  at different points in her stay Has now weaned to room air and completed Abx course - continue PT/OT  Cardiopulmonary arrest s/p CPR and ROSC Acute on Chronic HFrEF Hypertension - presented in shock Elevated troponin - demand ischemia.  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 Echo EF 30-35% (prior EF 45% in Aug 2024 at [facility]) Completed 48 hours IV heparin  --Cardiology signed off now --continue Lasix  40 mg PO BID --Strict Io's and daily weights --Monitor renal function, electrolytes - titrating back on antihypertensives. - losartan , carvedilol   Musculoskeletal chest pain - status  post CPR --Pain control PRN, per orders  UTI - E coli, Cellulitis- s/p antibiotic course. 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 --Sliding scale Novolog .   Stage II CKD  --Monitor renal function closely with diuresis --Avoid nephrotoxins and hypotension --Renally dose meds  Hyperlipemia --Lipitor  40 mg   Fibromyalgia No acute issues.  Morbid obesity (HCC) Body mass index is 48.02 kg/m. Complicates overall care and prognosis.  Recommend lifestyle modifications including physical activity and diet for weight loss and overall long-term health.   NAFLD (nonalcoholic fatty liver disease) No acute issues.     Subjective: No significant overnight events, patient was resting comfortably on the recliner.  Stated that she is just feeling weak and tired, no any specific complaints.  Denied any chest pain or palpitation, no shortness of breath.  Awaiting for SNF placement.   Physical Exam: Vitals:   02/22/23 0755 02/22/23 0811 02/22/23 1138 02/22/23 1640  BP: 129/63  102/85 (!) 132/52  Pulse: (!) 56 61 (!) 53 (!) 57  Resp: 16  16 16   Temp: (!) 97.4 F (36.3 C)  97.8 F (36.6 C) 97.6 F (36.4 C)  TempSrc: Oral  Oral Oral  SpO2: 98%  97% 97%  Weight:      Height:       General exam: awake, alert, no acute distress HEENT: moist mucus membranes, hearing grossly normal  Respiratory system: CTAB diminished bases due to low inspiratory volumes, no wheezes, rales or rhonchi, normal respiratory effort. Cardiovascular system: normal S1/S2, RRR, improving BLE edema.  Gastrointestinal system: soft, NT, ND, no HSM felt, +bowel sounds. Central nervous system: A&O x 3. no gross focal neurologic deficits, normal speech Extremities Non-pitting LE edema,  normal tone Skin: dry, intact, normal temperature Psychiatry: normal mood, congruent affect, judgement and insight appear normal  Data Reviewed:     Latest Ref Rng & Units 02/22/2023    4:39 AM 02/18/2023    5:18 AM 02/17/2023    8:57 AM  CBC  WBC 4.0 - 10.5 K/uL 7.0  8.1    Hemoglobin 12.0 - 15.0 g/dL 9.8  9.9  89.4   Hematocrit 36.0 - 46.0 % 31.6  31.8  31.0   Platelets 150 - 400 K/uL 145  150        Latest Ref Rng & Units 02/22/2023    4:39 AM 02/18/2023    5:18 AM 02/17/2023    8:57 AM  BMP  Glucose 70 - 99 mg/dL 845  851    BUN 8 - 23 mg/dL 52  39    Creatinine 9.55 - 1.00 mg/dL 8.61  8.68    Sodium 864 - 145 mmol/L 138  139  140   Potassium 3.5 - 5.1 mmol/L 4.4  4.2  4.4   Chloride 98 - 111 mmol/L 101  97    CO2 22 - 32 mmol/L 31  31    Calcium  8.9 - 10.3 mg/dL 8.7  8.7     Family Communication: None present at bedside  Disposition: Status is: Inpatient Remains inpatient appropriate because: Needs SNF placement, follow TOC, insurance Auth pending   Planned Discharge Destination: Skilled nursing facility     Time spent: 40 minutes  Author: Elvan Sor, MD 02/22/2023 5:50 PM  For on call review [email or web address].

## 2023-02-22 NOTE — TOC Progression Note (Signed)
 Transition of Care [facility]) - Progression Note    Patient Details  Name: [name] MRN: [id number] Date of Birth: 1940/12/16  [facility]) CM/SW Contact  Tomasa JAYSON Childes, RN Phone Number: 02/22/2023, 11:02 AM  Clinical Narrative:    Spoke with patient's son regarding bed offers. Patient and son have chosen [facility] [place]. He was advised and authorization would be required for SNF.   11:18am Nav[employer] auth started.      Expected Discharge Plan: Skilled Nursing Facility Barriers to Discharge: Continued Medical Work up  Expected Discharge Plan and Services     Post Acute Care Choice: Skilled Nursing Facility Living arrangements for the past 2 months: Single Family Home                                       Social Determinants of Health (SDOH) Interventions SDOH Screenings   Food Insecurity: No Food Insecurity (02/19/2023)  Housing: Low Risk  (02/19/2023)  Transportation Needs: No Transportation Needs (02/19/2023)  Utilities: Not At Risk (02/19/2023)  Alcohol Screen: Low Risk  (02/18/2023)  Financial Resource Strain: Medium Risk (02/18/2023)  Social Connections: Socially Isolated (10/22/2022)   [facility]  Stress: No Stress Concern Present (10/22/2022)   Received from [facility]  Tobacco Use: Medium Risk (02/18/2023)  Health Literacy: Low Risk  (10/22/2022)   Received from [facility]    Readmission Risk Interventions     No data to display

## 2023-02-22 NOTE — Plan of Care (Signed)
  Problem: Education: Goal: Knowledge of General Education information will improve Description: Including pain rating scale, medication(s)/side effects and non-pharmacologic comfort measures Outcome: Progressing   Problem: Health Behavior/Discharge Planning: Goal: Ability to manage health-related needs will improve Outcome: Progressing   Problem: Clinical Measurements: Goal: Ability to maintain clinical measurements within normal limits will improve Outcome: Progressing Goal: Will remain free from infection Outcome: Progressing Goal: Diagnostic test results will improve Outcome: Progressing Goal: Respiratory complications will improve Outcome: Progressing Goal: Cardiovascular complication will be avoided Outcome: Progressing   Problem: Activity: Goal: Risk for activity intolerance will decrease Outcome: Progressing   Problem: Nutrition: Goal: Adequate nutrition will be maintained Outcome: Progressing   Problem: Coping: Goal: Level of anxiety will decrease Outcome: Progressing   Problem: Elimination: Goal: Will not experience complications related to bowel motility Outcome: Progressing Goal: Will not experience complications related to urinary retention Outcome: Progressing   Problem: Pain Management: Goal: General experience of comfort will improve Outcome: Progressing   Problem: Safety: Goal: Ability to remain free from injury will improve Outcome: Progressing   Problem: Skin Integrity: Goal: Risk for impaired skin integrity will decrease Outcome: Progressing   Problem: Activity: Goal: Ability to tolerate increased activity will improve Outcome: Progressing   Problem: Respiratory: Goal: Ability to maintain a clear airway and adequate ventilation will improve Outcome: Progressing   Problem: Role Relationship: Goal: Method of communication will improve Outcome: Progressing   Problem: Education: Goal: Ability to describe self-care measures that may  prevent or decrease complications (Diabetes Survival Skills Education) will improve Outcome: Progressing Goal: Individualized Educational Video(s) Outcome: Progressing   Problem: Coping: Goal: Ability to adjust to condition or change in health will improve Outcome: Progressing   Problem: Fluid Volume: Goal: Ability to maintain a balanced intake and output will improve Outcome: Progressing   Problem: Health Behavior/Discharge Planning: Goal: Ability to identify and utilize available resources and services will improve Outcome: Progressing Goal: Ability to manage health-related needs will improve Outcome: Progressing   Problem: Metabolic: Goal: Ability to maintain appropriate glucose levels will improve Outcome: Progressing   Problem: Nutritional: Goal: Maintenance of adequate nutrition will improve Outcome: Progressing Goal: Progress toward achieving an optimal weight will improve Outcome: Progressing   Problem: Skin Integrity: Goal: Risk for impaired skin integrity will decrease Outcome: Progressing   Problem: Tissue Perfusion: Goal: Adequacy of tissue perfusion will improve Outcome: Progressing   Problem: Education: Goal: Understanding of CV disease, CV risk reduction, and recovery process will improve Outcome: Progressing Goal: Individualized Educational Video(s) Outcome: Progressing   Problem: Activity: Goal: Ability to return to baseline activity level will improve Outcome: Progressing   Problem: Cardiovascular: Goal: Ability to achieve and maintain adequate cardiovascular perfusion will improve Outcome: Progressing Goal: Vascular access site(s) Level 0-1 will be maintained Outcome: Progressing   Problem: Health Behavior/Discharge Planning: Goal: Ability to safely manage health-related needs after discharge will improve Outcome: Progressing

## 2023-02-22 NOTE — Progress Notes (Addendum)
 Occupational Therapy Treatment Patient Details Name: [name] MRN: [id number] DOB: 16-Nov-1940 Today's Date: 02/22/2023   History of present illness Pt is an 82 y/o F admitted on 02/11/23. Pt was d/c earlier in Dec. 2024 after being treated for BLE cellulitis & L toe infected ulcer with underlying fx of phalanx & osteomyelitis. Pt was on the way to the hospital for an appointment & found to be hypoxic. Pt required intubation for severe hypoxemia & developed circulatory shock. Pt was extubated 02/12/23. 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 education was provided about uses for Pt.'s large reacher at home. Reviewed uses for LE dressing. Pt. requires MAxA LE ADLs.  Reviewed energy conservation strategies, and PLB techniques. SpO2 is 93% on RA, HR 56 bpms. Pt. Continues to benefit from OT services for ADL training, A/E training, and pt. education about energy conservation, work simplification techniques, home modification, and DME. OT discharge recommendations remain appropriate.      If plan is discharge home, recommend the following:  A lot of help with walking and/or transfers;A lot of help with bathing/dressing/bathroom;Assistance with cooking/housework;Assist for transportation;Help with stairs or ramp for entrance;Direct supervision/assist for medications management;Supervision due to cognitive status   Equipment Recommendations       Recommendations for Other Services      Precautions / Restrictions Precautions Precautions: Fall Restrictions Weight Bearing Restrictions Per Provider Order: No LLE Weight Bearing Per Provider Order: Weight bearing as tolerated       Mobility Bed Mobility               General bed mobility comments: Pt. was sitting up in recliner upon arrival.    Transfers Overall transfer  level: Needs assistance   Transfers: Sit to/from Stand Sit to Stand: Contact guard assist     Step pivot transfers: Contact guard assist           Balance                                           ADL either performed or assessed with clinical judgement   ADL Overall ADL's : Needs assistance/impaired     Grooming: Set up;Contact guard assist               Lower Body Dressing: Maximal assistance                      Extremity/Trunk Assessment Upper Extremity Assessment Upper Extremity Assessment: Generalized weakness            Vision Patient Visual Report: No change from baseline     Perception     Praxis      Cognition Arousal: Alert Behavior During Therapy: WFL for tasks assessed/performed Overall Cognitive Status: History of cognitive impairments - at baseline                                          Exercises      Shoulder Instructions       General Comments      Pertinent Vitals/ Pain       Pain Assessment Pain Assessment: No/denies  pain  Home Living                                          Prior Functioning/Environment              Frequency  Min 1X/week        Progress Toward Goals  OT Goals(current goals can now be found in the care plan section)  Progress towards OT goals: Progressing toward goals  Acute Rehab OT Goals Patient Stated Goal: Regain independence OT Goal Formulation: With patient Time For Goal Achievement: 02/28/23 Potential to Achieve Goals: Good  Plan      Co-evaluation                 AM-PAC OT 6 Clicks Daily Activity     Outcome Measure   Help from another person eating meals?: None Help from another person taking care of personal grooming?: A Little Help from another person toileting, which includes using toliet, bedpan, or urinal?: A Lot Help from another person bathing (including washing, rinsing, drying)?: A  Lot Help from another person to put on and taking off regular upper body clothing?: A Little Help from another person to put on and taking off regular lower body clothing?: A Lot 6 Click Score: 16    End of Session    OT Visit Diagnosis: Other abnormalities of gait and mobility (R26.89);Muscle weakness (generalized) (M62.81)   Activity Tolerance Patient tolerated treatment well   Patient Left with call bell/phone within reach;Other (comment)   Nurse Communication          Time: 8790-8775 OT Time Calculation (min): 15 min  Charges: OT General Charges $OT Visit: 1 Visit OT Treatments $Self Care/Home Management : 8-22 mins  Richardson Otter, MS, OTR/L   Saahas Hidrogo 02/22/2023, 2:46 PM

## 2023-02-23 DIAGNOSIS — R579 Shock, unspecified: Secondary | ICD-10-CM | POA: Diagnosis not present

## 2023-02-23 LAB — GLUCOSE, CAPILLARY
Glucose-Capillary: 137 mg/dL — ABNORMAL HIGH (ref 70–99)
Glucose-Capillary: 173 mg/dL — ABNORMAL HIGH (ref 70–99)
Glucose-Capillary: 169 mg/dL — ABNORMAL HIGH (ref 70–99)

## 2023-02-23 MED ORDER — TRAMADOL HCL 50 MG PO TABS
50.0000 mg | ORAL_TABLET | Freq: Four times a day (QID) | ORAL | Status: DC | PRN
  Administered 2023-02-23 – 2023-03-01 (×8): 50 mg via ORAL
  Filled 2023-02-23 (×8): qty 1

## 2023-02-23 NOTE — Plan of Care (Signed)

## 2023-02-23 NOTE — TOC Progression Note (Addendum)
 Transition of Care [facility]) - Progression Note    Patient Details  Name: [name] MRN: [id number] Date of Birth: 16-Aug-1940  [facility] CM/SW Contact  Lauraine JAYSON Carpen, LCSW Phone Number: 02/23/2023, 3:12 PM  Clinical Narrative:  Shara approved but shara number has not generated yet. Reference # [id number]. Valid 12/31-1/2. CSW left message for [facility] admissions coordinator to notify. EMS is not transporting outside of [facility] today.  3:40 pm: Received a call from Brittany with [facility]. They can accept patient tomorrow if stable.  Expected Discharge Plan: Skilled Nursing Facility Barriers to Discharge: Continued Medical Work up  Expected Discharge Plan and Services     Post Acute Care Choice: Skilled Nursing Facility Living arrangements for the past 2 months: Single Family Home                                       Social Determinants of Health (SDOH) Interventions SDOH Screenings   Food Insecurity: No Food Insecurity (02/19/2023)  Housing: Low Risk  (02/19/2023)  Transportation Needs: No Transportation Needs (02/19/2023)  Utilities: Not At Risk (02/19/2023)  Alcohol Screen: Low Risk  (02/18/2023)  Financial Resource Strain: Medium Risk (02/18/2023)  Social Connections: Moderately Isolated (02/22/2023)  Stress: No Stress Concern Present (10/22/2022)   Received from [facility]  Tobacco Use: Medium Risk (02/18/2023)  Health Literacy: Low Risk  (10/22/2022)   Received from [facility]    Readmission Risk Interventions     No data to display

## 2023-02-23 NOTE — Progress Notes (Signed)
 Progress Note Patient: [name] FMW:[id number] DOB: 10/21/1940 DOA: 02/11/2023     12 DOS: the patient was seen and examined on 02/23/2023   Brief hospital course: 83 year old female with a history of Essential hypertension, heart failure with preserved LVEF, Severe AS s/p TAVR, hypertension, diabetes type 2, diabetic ulcers, fatty liver disease, GERD, chronic dysphagia, history of colon polyps, was admitted and discharged earlier in dec 2024 with b/l LE cellulits and left toe infected ulcer with underlying fracture of phalanx and osteomyelitis.  Presented to ED with hypoxia and was intubated in ED. CPR was performed and obtained ROSC.  Admitted to ICU and required pressor support from presumed septic shock following cardiopulmonary arrest in the ED. TRH assumed care 02/14/23. Cardiology following and treating with IV diuretics. She is slowly weaning her oxygen requirement. LHC on 12/26.   At present, she is on PO diuretics and stable ORA. Cardiology has signed off. Awaiting dc to SNF based on PT/OT recs.   Assessment and Plan: Acute respiratory failure with hypoxia - intubated 12/20. extubated 12/21 Presumed septic shock - POA, shock physiology is resolved. Blood cultures negative. BAL culture normal respiratory flora. S/p cefuroxime , doxycycline , cefepime , ceftriaxone , azithromycin , vancomycin  at different points in her stay Has now weaned to room air and completed Abx course - continue PT/OT  Cardiopulmonary arrest s/p CPR and ROSC Acute on Chronic HFrEF Hypertension - presented in shock Elevated troponin - demand ischemia.  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 Echo EF 30-35% (prior EF 45% in Aug 2024 at [facility]) Completed 48 hours IV heparin  --Cardiology signed off now --continue Lasix  40 mg PO BID --Strict Io's and daily weights --Monitor renal function, electrolytes - titrating back on antihypertensives. - losartan , carvedilol   Musculoskeletal chest pain - status  post CPR --Pain control PRN, per orders  UTI - E coli, Cellulitis- s/p antibiotic course. 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 --Sliding scale Novolog .   Stage II CKD  --Monitor renal function closely with diuresis --Avoid nephrotoxins and hypotension --Renally dose meds  Hyperlipemia --Lipitor  40 mg   Fibromyalgia No acute issues.  Morbid obesity (HCC) Body mass index is 48.02 kg/m. Complicates overall care and prognosis.  Recommend lifestyle modifications including physical activity and diet for weight loss and overall long-term health.   NAFLD (nonalcoholic fatty liver disease) No acute issues.     Subjective: No significant overnight events, patient was resting comfortably on the recliner.  Stated that she is just feeling weak and tired, no any specific complaints.  Denied any chest pain or palpitation, no shortness of breath.  Awaiting for SNF placement.   Physical Exam: Vitals:   02/23/23 0503 02/23/23 0741 02/23/23 1142 02/23/23 1510  BP: (!) 137/92 132/61 (!) 104/91 (!) 104/52  Pulse: 61 62 (!) 58 (!) 55  Resp: 20 18 18 16   Temp: 97.9 F (36.6 C) 97.9 F (36.6 C) 97.7 F (36.5 C) 97.8 F (36.6 C)  TempSrc:  Oral Oral Oral  SpO2: 98% 100% 98% 99%  Weight:      Height:       General exam: awake, alert, no acute distress HEENT: moist mucus membranes, hearing grossly normal  Respiratory system: CTAB diminished bases due to low inspiratory volumes, no wheezes, rales or rhonchi, normal respiratory effort. Cardiovascular system: normal S1/S2, RRR, improving  BLE edema.   Gastrointestinal system: soft, NT, ND, no HSM felt, +bowel sounds. Central nervous system: A&O x 3. no gross focal neurologic deficits, normal  speech Extremities Non-pitting LE edema, normal tone Skin: dry, intact, normal temperature Psychiatry: normal mood, congruent affect, judgement and insight appear normal  Data Reviewed:     Latest Ref Rng & Units 02/22/2023    4:39 AM 02/18/2023    5:18 AM 02/17/2023    8:57 AM  CBC  WBC 4.0 - 10.5 K/uL 7.0  8.1    Hemoglobin 12.0 - 15.0 g/dL 9.8  9.9  89.4   Hematocrit 36.0 - 46.0 % 31.6  31.8  31.0   Platelets 150 - 400 K/uL 145  150        Latest Ref Rng & Units 02/22/2023    4:39 AM 02/18/2023    5:18 AM 02/17/2023    8:57 AM  BMP  Glucose 70 - 99 mg/dL 845  851    BUN 8 - 23 mg/dL 52  39    Creatinine 9.55 - 1.00 mg/dL 8.61  8.68    Sodium 864 - 145 mmol/L 138  139  140   Potassium 3.5 - 5.1 mmol/L 4.4  4.2  4.4   Chloride 98 - 111 mmol/L 101  97    CO2 22 - 32 mmol/L 31  31    Calcium  8.9 - 10.3 mg/dL 8.7  8.7     Family Communication: None present at bedside  Disposition: Status is: Inpatient Remains inpatient appropriate because: Needs SNF placement, follow TOC, insurance Auth received today, patient can be transported tomorrow a.m., [place] not sure if [place] of the [place] today.   Planned Discharge Destination: Skilled nursing facility     Time spent: 40 minutes  Author: Elvan Sor, MD 02/23/2023 3:44 PM  For on call review [email or web address]

## 2023-02-24 DIAGNOSIS — R579 Shock, unspecified: Secondary | ICD-10-CM | POA: Diagnosis not present

## 2023-02-24 LAB — GLUCOSE, CAPILLARY
Glucose-Capillary: 120 mg/dL — ABNORMAL HIGH (ref 70–99)
Glucose-Capillary: 133 mg/dL — ABNORMAL HIGH (ref 70–99)
Glucose-Capillary: 177 mg/dL — ABNORMAL HIGH (ref 70–99)

## 2023-02-24 LAB — TROPONIN I (HIGH SENSITIVITY)
Troponin I (High Sensitivity): 135 ng/L (ref <18)
Troponin I (High Sensitivity): 129 ng/L (ref <18)

## 2023-02-24 MED ORDER — ASPIRIN 81 MG PO CHEW
324.0000 mg | CHEWABLE_TABLET | Freq: Once | ORAL | Status: AC
  Administered 2023-02-24: 324 mg via ORAL
  Filled 2023-02-24: qty 4

## 2023-02-24 MED ORDER — CARVEDILOL 3.125 MG PO TABS: 3.1250 mg | ORAL_TABLET | Freq: Two times a day (BID) | ORAL | Status: DC

## 2023-02-24 MED ORDER — TRAMADOL HCL 50 MG PO TABS: 50.0000 mg | ORAL_TABLET | Freq: Three times a day (TID) | ORAL | 0 refills | Status: DC | PRN

## 2023-02-24 MED ORDER — NITROGLYCERIN 0.4 MG SL SUBL
0.4000 mg | SUBLINGUAL_TABLET | SUBLINGUAL | Status: DC | PRN
  Filled 2023-02-24 (×2): qty 1

## 2023-02-24 MED ORDER — FUROSEMIDE 40 MG PO TABS: 40.0000 mg | ORAL_TABLET | Freq: Two times a day (BID) | ORAL | Status: DC

## 2023-02-24 MED ORDER — MORPHINE SULFATE (PF) 2 MG/ML IV SOLN
2.0000 mg | INTRAVENOUS | Status: DC | PRN
  Filled 2023-02-24 (×2): qty 1

## 2023-02-24 MED ORDER — POTASSIUM CHLORIDE CRYS ER 20 MEQ PO TBCR: 20.0000 meq | EXTENDED_RELEASE_TABLET | Freq: Every day | ORAL | Status: DC

## 2023-02-24 NOTE — TOC Progression Note (Signed)
 Transition of Care [facility]) - Progression Note    Patient Details  Name: [name] MRN: [id number] Date of Birth: 04-Jul-1940  Transition of Care [facility]) CM/SW Contact  Tomasa JAYSON Childes, RN Phone Number: 02/24/2023, 3:37 PM  Clinical Narrative:    Per [place] at [facility], facility will start auth. Updated therapy notes will be required for auth. Therapist notified.    Expected Discharge Plan: Skilled Nursing Facility Barriers to Discharge: Continued Medical Work up  Expected Discharge Plan and Services     Post Acute Care Choice: Skilled Nursing Facility Living arrangements for the past 2 months: Single Family Home Expected Discharge Date: 02/24/23                                     Social Determinants of Health (SDOH) Interventions SDOH Screenings   Food Insecurity: No Food Insecurity (02/19/2023)  Housing: Low Risk  (02/19/2023)  Transportation Needs: No Transportation Needs (02/19/2023)  Utilities: Not At Risk (02/19/2023)  Alcohol Screen: Low Risk  (02/18/2023)  Financial Resource Strain: Medium Risk (02/18/2023)  Social Connections: Moderately Isolated (02/22/2023)  Stress: No Stress Concern Present (10/22/2022)   Received from [facility]  Tobacco Use: Medium Risk (02/18/2023)  Health Literacy: Low Risk  (10/22/2022)   Received from [facility]    Readmission Risk Interventions     No data to display

## 2023-02-24 NOTE — Progress Notes (Signed)
 Progress Note Patient: [name] FMW:[id number] DOB: 1940-11-03 DOA: 02/11/2023     13 DOS: the patient was seen and examined on 02/24/2023   Brief hospital course: 83 year old female with a history of Essential hypertension, heart failure with preserved LVEF, Severe AS s/p TAVR, hypertension, diabetes type 2, diabetic ulcers, fatty liver disease, GERD, chronic dysphagia, history of colon polyps, was admitted and discharged earlier in dec 2024 with b/l LE cellulits and left toe infected ulcer with underlying fracture of phalanx and osteomyelitis.  Presented to ED with hypoxia and was intubated in ED. CPR was performed and obtained ROSC.  Admitted to ICU and required pressor support from presumed septic shock following cardiopulmonary arrest in the ED. TRH assumed care 02/14/23. Cardiology following and treating with IV diuretics. She is slowly weaning her oxygen requirement. LHC on 12/26.   At present, she is on PO diuretics and stable ORA. Cardiology has signed off. Awaiting dc to SNF based on PT/OT recs.   Assessment and Plan: Acute respiratory failure with hypoxia - intubated 12/20. extubated 12/21 Presumed septic shock - POA, shock physiology is resolved. Blood cultures negative. BAL culture normal respiratory flora. S/p cefuroxime , doxycycline , cefepime , ceftriaxone , azithromycin , vancomycin  at different points in her stay Has now weaned to room air and completed Abx course - continue PT/OT  Cardiopulmonary arrest s/p CPR and ROSC Acute on Chronic HFrEF Hypertension - presented in shock Elevated troponin - demand ischemia.  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 Echo EF 30-35% (prior EF 45% in Aug 2024 at [facility]) Completed 48 hours IV heparin  --Cardiology signed off now --continue Lasix  40 mg PO BID --Strict Io's and daily weights --Monitor renal function, electrolytes - titrating back on antihypertensives. - losartan , carvedilol   Musculoskeletal chest pain - status  post CPR --Pain control PRN, per orders 1/2 c/o off-and-on chest wall pain most likely musculoskeletal after CPR, continue as needed medication for pain control.  UTI - E coli, Cellulitis- s/p antibiotic course. 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 --Sliding scale Novolog .   Stage II CKD  --Monitor renal function closely with diuresis --Avoid nephrotoxins and hypotension --Renally dose meds  Hyperlipemia --Lipitor  40 mg   Fibromyalgia No acute issues.  Morbid obesity  Body mass index is 48.02 kg/m. Complicates overall care and prognosis.  Recommend lifestyle modifications including physical activity and diet for weight loss and overall long-term health.   NAFLD (nonalcoholic fatty liver disease) No acute issues.     Subjective: No significant overnight events, the morning patient woke up with the chest wall pain which is often ongoing on for several days after CPR.  Pain resolved on my exam. Patient was sitting fully on the recliner, denied any other complaints.    Physical Exam: Vitals:   02/23/23 1923 02/23/23 2343 02/24/23 0325 02/24/23 0900  BP: (!) 119/46 (!) 128/94 (!) 127/90 (!) 111/91  Pulse: (!) 56 (!) 57 (!) 55 (!) 57  Resp: 15 20 20 19   Temp: 97.8 F (36.6 C) 97.7 F (36.5 C) 97.7 F (36.5 C) 97.6 F (36.4 C)  TempSrc: Oral Oral Oral Oral  SpO2: 96% 99% 95% 98%  Weight:      Height:       General exam: awake, alert, no acute distress HEENT: moist mucus membranes, hearing grossly normal  Respiratory system:  CTAB diminished bases due to low inspiratory volumes, no wheezes, rales or rhonchi, normal respiratory effort. Cardiovascular system: normal S1/S2, RRR, improving BLE edema.   Gastrointestinal system: soft, NT, ND,  no HSM felt, +bowel sounds. Central nervous system: A&O x 3. no gross focal neurologic deficits, normal speech Extremities Non-pitting LE edema, normal tone Skin: dry, intact, normal temperature Psychiatry: normal mood, congruent affect, judgement and insight appear normal  Data Reviewed:     Latest Ref Rng & Units 02/22/2023    4:39 AM 02/18/2023    5:18 AM 02/17/2023    8:57 AM  CBC  WBC 4.0 - 10.5 K/uL 7.0  8.1    Hemoglobin 12.0 - 15.0 g/dL 9.8  9.9  89.4   Hematocrit 36.0 - 46.0 % 31.6  31.8  31.0   Platelets 150 - 400 K/uL 145  150        Latest Ref Rng & Units 02/22/2023    4:39 AM 02/18/2023    5:18 AM 02/17/2023    8:57 AM  BMP  Glucose 70 - 99 mg/dL 845  851    BUN 8 - 23 mg/dL 52  39    Creatinine 9.55 - 1.00 mg/dL 8.61  8.68    Sodium 864 - 145 mmol/L 138  139  140   Potassium 3.5 - 5.1 mmol/L 4.4  4.2  4.4   Chloride 98 - 111 mmol/L 101  97    CO2 22 - 32 mmol/L 31  31    Calcium  8.9 - 10.3 mg/dL 8.7  8.7     Family Communication: None present at bedside  Disposition: Status is: Inpatient Remains inpatient appropriate because: Needs SNF placement, follow TOC,  1/2 Facility has to start insurance Auth again as per [facility]    Planned Discharge Destination: Skilled nursing facility     Time spent: 40 minutes  Author: Elvan Sor, MD 02/24/2023 4:36 PM  For on call review [email or web address]

## 2023-02-24 NOTE — Plan of Care (Signed)
  Problem: Education: Goal: Knowledge of General Education information will improve Description: Including pain rating scale, medication(s)/side effects and non-pharmacologic comfort measures Outcome: Progressing   Problem: Clinical Measurements: Goal: Respiratory complications will improve Outcome: Progressing   Problem: Clinical Measurements: Goal: Cardiovascular complication will be avoided Outcome: Progressing   Problem: Elimination: Goal: Will not experience complications related to bowel motility Outcome: Progressing   Problem: Pain Management: Goal: General experience of comfort will improve Outcome: Progressing   Problem: Safety: Goal: Ability to remain free from injury will improve Outcome: Progressing

## 2023-02-25 DIAGNOSIS — R579 Shock, unspecified: Secondary | ICD-10-CM | POA: Diagnosis not present

## 2023-02-25 LAB — GLUCOSE, CAPILLARY
Glucose-Capillary: 142 mg/dL — ABNORMAL HIGH (ref 70–99)
Glucose-Capillary: 154 mg/dL — ABNORMAL HIGH (ref 70–99)
Glucose-Capillary: 164 mg/dL — ABNORMAL HIGH (ref 70–99)
Glucose-Capillary: 272 mg/dL — ABNORMAL HIGH (ref 70–99)

## 2023-02-25 NOTE — Plan of Care (Signed)
 Problem: Education: Goal: Knowledge of General Education information will improve Description: Including pain rating scale, medication(s)/side effects and non-pharmacologic comfort measures Outcome: Progressing   Problem: Health Behavior/Discharge Planning: Goal: Ability to manage health-related needs will improve Outcome: Progressing   Problem: Clinical Measurements: Goal: Ability to maintain clinical measurements within normal limits will improve Outcome: Progressing Goal: Will remain free from infection Outcome: Progressing Goal: Diagnostic test results will improve Outcome: Progressing Goal: Respiratory complications will improve Outcome: Progressing Goal: Cardiovascular complication will be avoided Outcome: Progressing   Problem: Activity: Goal: Risk for activity intolerance will decrease Outcome: Progressing   Problem: Nutrition: Goal: Adequate nutrition will be maintained Outcome: Progressing   Problem: Coping: Goal: Level of anxiety will decrease Outcome: Progressing   Problem: Elimination: Goal: Will not experience complications related to bowel motility Outcome: Progressing Goal: Will not experience complications related to urinary retention Outcome: Progressing   Problem: Pain Management: Goal: General experience of comfort will improve Outcome: Progressing   Problem: Safety: Goal: Ability to remain free from injury will improve Outcome: Progressing   Problem: Skin Integrity: Goal: Risk for impaired skin integrity will decrease Outcome: Progressing   Problem: Activity: Goal: Ability to tolerate increased activity will improve Outcome: Progressing   Problem: Respiratory: Goal: Ability to maintain a clear airway and adequate ventilation will improve Outcome: Progressing   Problem: Role Relationship: Goal: Method of communication will improve Outcome: Progressing   Problem: Education: Goal: Ability to describe self-care measures that may  prevent or decrease complications (Diabetes Survival Skills Education) will improve Outcome: Progressing Goal: Individualized Educational Video(s) Outcome: Progressing   Problem: Coping: Goal: Ability to adjust to condition or change in health will improve Outcome: Progressing   Problem: Fluid Volume: Goal: Ability to maintain a balanced intake and output will improve Outcome: Progressing   Problem: Health Behavior/Discharge Planning: Goal: Ability to identify and utilize available resources and services will improve Outcome: Progressing Goal: Ability to manage health-related needs will improve Outcome: Progressing   Problem: Metabolic: Goal: Ability to maintain appropriate glucose levels will improve Outcome: Progressing   Problem: Nutritional: Goal: Maintenance of adequate nutrition will improve Outcome: Progressing Goal: Progress toward achieving an optimal weight will improve Outcome: Progressing   Problem: Skin Integrity: Goal: Risk for impaired skin integrity will decrease Outcome: Progressing   Problem: Tissue Perfusion: Goal: Adequacy of tissue perfusion will improve Outcome: Progressing   Problem: Education: Goal: Knowledge of General Education information will improve Description: Including pain rating scale, medication(s)/side effects and non-pharmacologic comfort measures Outcome: Progressing   Problem: Health Behavior/Discharge Planning: Goal: Ability to manage health-related needs will improve Outcome: Progressing   Problem: Clinical Measurements: Goal: Ability to maintain clinical measurements within normal limits will improve Outcome: Progressing Goal: Will remain free from infection Outcome: Progressing Goal: Diagnostic test results will improve Outcome: Progressing Goal: Respiratory complications will improve Outcome: Progressing Goal: Cardiovascular complication will be avoided Outcome: Progressing   Problem: Activity: Goal: Risk for  activity intolerance will decrease Outcome: Progressing   Problem: Nutrition: Goal: Adequate nutrition will be maintained Outcome: Progressing   Problem: Coping: Goal: Level of anxiety will decrease Outcome: Progressing   Problem: Elimination: Goal: Will not experience complications related to bowel motility Outcome: Progressing Goal: Will not experience complications related to urinary retention Outcome: Progressing   Problem: Pain Management: Goal: General experience of comfort will improve Outcome: Progressing   Problem: Safety: Goal: Ability to remain free from injury will improve Outcome: Progressing   Problem: Skin Integrity: Goal: Risk for impaired skin integrity will  decrease Outcome: Progressing

## 2023-02-25 NOTE — Progress Notes (Signed)
 Progress Note Patient: [name] FMW:[id number] DOB: 10/12/1940 DOA: 02/11/2023     14 DOS: the patient was seen and examined on 02/25/2023   Brief hospital course: 83 year old female with a history of Essential hypertension, heart failure with preserved LVEF, Severe AS s/p TAVR, hypertension, diabetes type 2, diabetic ulcers, fatty liver disease, GERD, chronic dysphagia, history of colon polyps, was admitted and discharged earlier in dec 2024 with b/l LE cellulits and left toe infected ulcer with underlying fracture of phalanx and osteomyelitis.  Presented to ED with hypoxia and was intubated in ED. CPR was performed and obtained ROSC.  Admitted to ICU and required pressor support from presumed septic shock following cardiopulmonary arrest in the ED. TRH assumed care 02/14/23. Cardiology following and treating with IV diuretics. She is slowly weaning her oxygen requirement. LHC on 12/26.   At present, she is on PO diuretics and stable ORA. Cardiology has signed off. Awaiting dc to SNF based on PT/OT recs.   Assessment and Plan: Acute respiratory failure with hypoxia - intubated 12/20. extubated 12/21 Presumed septic shock - POA, shock physiology is resolved. Blood cultures negative. BAL culture normal respiratory flora. S/p cefuroxime , doxycycline , cefepime , ceftriaxone , azithromycin , vancomycin  at different points in her stay Has now weaned to room air and completed Abx course - continue PT/OT  Cardiopulmonary arrest s/p CPR and ROSC Acute on Chronic HFrEF Hypertension - presented in shock Elevated troponin - demand ischemia.  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 Echo EF 30-35% (prior EF 45% in Aug 2024 at [facility]) Completed 48 hours IV heparin  --Cardiology signed off now --continue Lasix  40 mg PO BID --Strict Io's and daily weights --Monitor renal function, electrolytes - titrating back on antihypertensives. - losartan , carvedilol   Musculoskeletal chest pain - status  post CPR --Pain control PRN, per orders 1/2 c/o off-and-on chest wall pain most likely musculoskeletal after CPR, continue as needed medication for pain control.  UTI - E coli, Cellulitis- s/p antibiotic course. 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 --Sliding scale Novolog .   Stage II CKD  --Monitor renal function closely with diuresis --Avoid nephrotoxins and hypotension --Renally dose meds  Hyperlipemia --Lipitor  40 mg   Fibromyalgia No acute issues.  Morbid obesity  Body mass index is 48.02 kg/m. Complicates overall care and prognosis.  Recommend lifestyle modifications including physical activity and diet for weight loss and overall long-term health.   NAFLD (nonalcoholic fatty liver disease) No acute issues.     Subjective: No significant overnight events, patient still has chest wall soreness but is stable, no acute pain today.  Patient was sitting comfortably on the recliner.   Physical Exam: Vitals:   02/24/23 2346 02/25/23 0300 02/25/23 0500 02/25/23 0847  BP: 90/67   (!) 108/37  Pulse: 66   61  Resp: 20   17  Temp: (!) 97.5 F (36.4 C) 97.6 F (36.4 C)  97.6 F (36.4 C)  TempSrc:  Oral  Oral  SpO2: 97%   96%  Weight:   125.5 kg   Height:       General exam: awake, alert, no acute distress HEENT: moist mucus membranes, hearing grossly normal  Respiratory system: CTAB diminished bases due to low inspiratory volumes, no wheezes, rales or rhonchi, normal respiratory effort. Cardiovascular system: normal S1/S2, RRR, improving BLE edema.  Gastrointestinal system: soft, NT, ND, no HSM felt, +bowel sounds. Central nervous system: A&O x 3. no gross focal neurologic deficits, normal speech Extremities Non-pitting LE edema,  normal tone Skin: dry, intact, normal temperature Psychiatry: normal mood, congruent affect, judgement and insight appear normal  Data Reviewed:     Latest Ref Rng & Units 02/22/2023    4:39 AM 02/18/2023    5:18 AM 02/17/2023    8:57 AM  CBC  WBC 4.0 - 10.5 K/uL 7.0  8.1    Hemoglobin 12.0 - 15.0 g/dL 9.8  9.9  89.4   Hematocrit 36.0 - 46.0 % 31.6  31.8  31.0   Platelets 150 - 400 K/uL 145  150        Latest Ref Rng & Units 02/22/2023    4:39 AM 02/18/2023    5:18 AM 02/17/2023    8:57 AM  BMP  Glucose 70 - 99 mg/dL 845  851    BUN 8 - 23 mg/dL 52  39    Creatinine 9.55 - 1.00 mg/dL 8.61  8.68    Sodium 864 - 145 mmol/L 138  139  140   Potassium 3.5 - 5.1 mmol/L 4.4  4.2  4.4   Chloride 98 - 111 mmol/L 101  97    CO2 22 - 32 mmol/L 31  31    Calcium  8.9 - 10.3 mg/dL 8.7  8.7     Family Communication: None present at bedside  Disposition: Status is: Inpatient Remains inpatient appropriate because: Needs SNF placement, follow TOC,  1/2 Facility has to start insurance Auth again as per [facility]    Planned Discharge Destination: Skilled nursing facility     Time spent: 40 minutes  Author: Elvan Sor, MD 02/25/2023 12:34 PM  For on call review [email or web address]

## 2023-02-25 NOTE — TOC Progression Note (Signed)
 Transition of Care [facility]) - Progression Note    Patient Details  Name: CHENEE MUNNS MRN: [id number] Date of Birth: 1940-11-07  Transition of Care [facility]) CM/SW Contact  Royanne JINNY Bernheim, RN Phone Number: 02/25/2023, 11:32 AM  Clinical Narrative:    Reached out to Brittany at [facility] to see if they have Ins approval, I checked [employer] portal and show Ins approved 12/31-01/02  Called [facility] [place] to speak with Olita he stated that they have submitted Ins auth today and are waiting on Ins approval her Ins changed to [facility] 01/01 He will call later with the person who will be covering there over the weekend so we can stay in touch   Expected Discharge Plan: Skilled Nursing Facility Barriers to Discharge: Continued Medical Work up  Expected Discharge Plan and Services     Post Acute Care Choice: Skilled Nursing Facility Living arrangements for the past 2 months: Single Family Home Expected Discharge Date: 02/24/23                                     Social Determinants of Health (SDOH) Interventions SDOH Screenings   Food Insecurity: No Food Insecurity (02/19/2023)  Housing: Low Risk  (02/19/2023)  Transportation Needs: No Transportation Needs (02/19/2023)  Utilities: Not At Risk (02/19/2023)  Alcohol Screen: Low Risk  (02/18/2023)  Financial Resource Strain: Medium Risk (02/18/2023)  Social Connections: Moderately Isolated (02/22/2023)  Stress: No Stress Concern Present (10/22/2022)   Received from [facility]  Tobacco Use: Medium Risk (02/18/2023)  Health Literacy: Low Risk  (10/22/2022)   Received from [facility]    Readmission Risk Interventions     No data to display

## 2023-02-25 NOTE — Progress Notes (Signed)
 Patient offered to be assisted with hygiene care tonight. Patient refused. Will continue to offer.

## 2023-02-25 NOTE — Progress Notes (Signed)
 Physical Therapy Treatment Patient Details Name: [name] MRN: [id number] DOB: 12-19-1940 Today's Date: 02/25/2023   History of Present Illness Pt is an 83 y/o F admitted on 02/11/23. Pt was d/c earlier in Dec. 2024 after being treated for BLE cellulitis & L toe infected ulcer with underlying fx of phalanx & osteomyelitis. Pt was on the way to the hospital for an appointment & found to be hypoxic. Pt required intubation for severe hypoxemia & developed circulatory shock. Pt was extubated 02/12/23. PMH: essential HTN, HF with preserved LVEF, severe AS s/p TAVR, HTN, DM2, diabetic ulcers, fatty liver disease, GERD, chronic dysphagia, colon polyps, L great toe amputation 01/24/23    PT Comments  Pt was sitting in recliner upon arrival. She is A and O x 3. She is cooperative and motivated. Pt is still not at her baseline but is improving.Discussed DC disposition and pt is motivated to go to STR prior to returning home with her son. She required CGA for transfers and ambulation short distances with RW. Slightly SOB with minimal gait distances. Acute PT will continue to follow and progress per current POC. DC recs remain appropriate.    If plan is discharge home, recommend the following: Assistance with cooking/housework;Assist for transportation;Help with stairs or ramp for entrance;A little help with walking and/or transfers;A little help with bathing/dressing/bathroom     Equipment Recommendations  Other (comment) (defer to next level of care)       Precautions / Restrictions Precautions Precautions: Fall Restrictions Weight Bearing Restrictions Per Provider Order: Yes LLE Weight Bearing Per Provider Order: Weight bearing as tolerated     Mobility     Transfers Overall transfer level: Needs assistance Equipment used: Rolling walker (2 wheels) Transfers: Sit to/from Stand Sit to Stand: Contact guard assist  General transfer comment: CGA for safety to STS from recliner. vcs for  handplacement and better eccentric control with stand > sit    Ambulation/Gait Ambulation/Gait assistance: Contact guard assist Gait Distance (Feet): 30 Feet Assistive device: Rolling walker (2 wheels) Gait Pattern/deviations: Step-through pattern, Decreased step length - right, Decreased step length - left, Trunk flexed Gait velocity: decreased  General Gait Details: Pt ambulated 30 ft with RW. Distance limited by fatigue. slightly SOB with minimal gait distance.    Balance Overall balance assessment: Needs assistance Sitting-balance support: Feet supported Sitting balance-Leahy Scale: Good     Standing balance support: Bilateral upper extremity supported, During functional activity, Reliant on assistive device for balance Standing balance-Leahy Scale: Fair       Cognition Arousal: Alert Behavior During Therapy: WFL for tasks assessed/performed Overall Cognitive Status: History of cognitive impairments - at baseline    General Comments: Pt is A and O x3 agreeable to session and very pleasant. Discussed DC duispoition. Pt still agreeable and motivated for DC to STR               Pertinent Vitals/Pain Pain Assessment Pain Assessment: No/denies pain Pain Descriptors / Indicators: Guarding, Grimacing, Discomfort Pain Intervention(s): Limited activity within patient's tolerance, Monitored during session, Premedicated before session, Repositioned     PT Goals (current goals can now be found in the care plan section) Acute Rehab PT Goals Patient Stated Goal: get better Progress towards PT goals: Progressing toward goals    Frequency    Min 1X/week       AM-PAC PT 6 Clicks Mobility   Outcome Measure  Help needed turning from your back to your side while in a flat bed without  using bedrails?: A Little Help needed moving from lying on your back to sitting on the side of a flat bed without using bedrails?: A Little Help needed moving to and from a bed to a chair  (including a wheelchair)?: A Little Help needed standing up from a chair using your arms (e.g., wheelchair or bedside chair)?: A Little Help needed to walk in hospital room?: A Little Help needed climbing 3-5 steps with a railing? : A Lot 6 Click Score: 17    End of Session   Activity Tolerance: Patient tolerated treatment well Patient left: in chair;with call bell/phone within reach;with chair alarm set Nurse Communication: Mobility status PT Visit Diagnosis: Other abnormalities of gait and mobility (R26.89);Muscle weakness (generalized) (M62.81);Difficulty in walking, not elsewhere classified (R26.2)     Time: 9096-9086 PT Time Calculation (min) (ACUTE ONLY): 10 min  Charges:    $Gait Training: 8-22 mins PT General Charges $$ ACUTE PT VISIT: 1 Visit                     Rankin Essex PTA 02/25/23, 9:51 AM

## 2023-02-25 NOTE — TOC Progression Note (Signed)
 Transition of Care [facility]) - Progression Note    Patient Details  Name: ZEPHANIAH ENYEART MRN: [id number] Date of Birth: [birth year]  [facility] CM/SW Contact  Royanne JINNY Bernheim, RN Phone Number: 02/25/2023, 2:48 PM  Clinical Narrative:    oncall person is [name] Ply [phone]   They are waiting on [employer] approval   Expected Discharge Plan: Skilled Nursing Facility Barriers to Discharge: Continued Medical Work up  Expected Discharge Plan and Services     Post Acute Care Choice: Skilled Nursing Facility Living arrangements for the past 2 months: Single Family Home Expected Discharge Date: 02/24/23                                     Social Determinants of Health (SDOH) Interventions SDOH Screenings   Food Insecurity: No Food Insecurity (02/19/2023)  Housing: Low Risk  (02/19/2023)  Transportation Needs: No Transportation Needs (02/19/2023)  Utilities: Not At Risk (02/19/2023)  Alcohol Screen: Low Risk  (02/18/2023)  Financial Resource Strain: Medium Risk (02/18/2023)  Social Connections: Moderately Isolated (02/22/2023)  Stress: No Stress Concern Present (10/22/2022)   Received from [facility]  Tobacco Use: Medium Risk (02/18/2023)  Health Literacy: Low Risk  (10/22/2022)   Received from [facility]    Readmission Risk Interventions     No data to display

## 2023-02-25 NOTE — Progress Notes (Signed)
 Occupational Therapy Treatment Patient Details Name: [name] MRN: [id number] DOB: 09/22/1940 Today's Date: 02/25/2023   History of present illness Pt is an 83 y/o F admitted on 02/11/23. Pt was d/c earlier in Dec. 2024 after being treated for BLE cellulitis & L toe infected ulcer with underlying fx of phalanx & osteomyelitis. Pt was on the way to the hospital for an appointment & found to be hypoxic. Pt required intubation for severe hypoxemia & developed circulatory shock. Pt was extubated 02/12/23. PMH: essential HTN, HF with preserved LVEF, severe AS s/p TAVR, HTN, DM2, diabetic ulcers, fatty liver disease, GERD, chronic dysphagia, colon polyps, L great toe amputation 01/24/23   OT comments  Pt seen for OT tx. Pt sleeping in recliner, wakes easily, and agreeable to session. Pt required increased time, effort, and CGA for transfers from recliner and BSC using RW for taking ~5 steps, VC for hand placement to improve safety with transitions. Set up for seated pericare. Educated in bracing strategies to support chest discomfort with coughing/sneezing. Pt verbalized understanding. Pt continues to demonstrate progress towards goals and continues to benefit from skilled OT services.       If plan is discharge home, recommend the following:  A lot of help with bathing/dressing/bathroom;Assistance with cooking/housework;Assist for transportation;Help with stairs or ramp for entrance;Direct supervision/assist for medications management;Supervision due to cognitive status;A little help with walking and/or transfers   Equipment Recommendations  Other (comment) (defer)    Recommendations for Other Services      Precautions / Restrictions Precautions Precautions: Fall Restrictions Weight Bearing Restrictions Per Provider Order: Yes LLE Weight Bearing Per Provider Order: Weight bearing as tolerated       Mobility Bed Mobility               General bed mobility comments: Pt. was sitting  up in recliner upon start and end of session    Transfers Overall transfer level: Needs assistance Equipment used: Rolling walker (2 wheels) Transfers: Sit to/from Stand Sit to Stand: Contact guard assist           General transfer comment: Pt very slow to stand, VC for proper hand placement to improve safety.     Balance Overall balance assessment: Needs assistance Sitting-balance support: Feet supported Sitting balance-Leahy Scale: Good     Standing balance support: Bilateral upper extremity supported, During functional activity, Reliant on assistive device for balance Standing balance-Leahy Scale: Fair                             ADL either performed or assessed with clinical judgement   ADL Overall ADL's : Needs assistance/impaired                         Toilet Transfer: Contact guard assist;BSC/3in1;Rolling walker (2 wheels)   Toileting- Clothing Manipulation and Hygiene: Minimal assistance;Sitting/lateral lean Toileting - Clothing Manipulation Details (indicate cue type and reason): pt noted with small bladder incontinence while ambulating from the recliner to the [facility] requiring assist to clean up. Pt able to complete pericare in sitting with setup.     Functional mobility during ADLs: Contact guard assist;Rolling walker (2 wheels)      Extremity/Trunk Assessment              Vision       Perception     Praxis      Cognition Arousal: Alert Behavior During Therapy: [facility]  for tasks assessed/performed Overall Cognitive Status: History of cognitive impairments - at baseline                         Following Commands: Follows one step commands with increased time                Exercises      Shoulder Instructions       General Comments      Pertinent Vitals/ Pain       Pain Assessment Pain Assessment: 0-10 Pain Score: 8  Pain Location: sternal/chest soreness with coughing and mobility Pain Descriptors /  Indicators: Guarding, Grimacing, Discomfort, Sore Pain Intervention(s): Limited activity within patient's tolerance, Monitored during session, Repositioned  Home Living                                          Prior Functioning/Environment              Frequency  Min 1X/week        Progress Toward Goals  OT Goals(current goals can now be found in the care plan section)  Progress towards OT goals: Progressing toward goals  Acute Rehab OT Goals Patient Stated Goal: regain independence OT Goal Formulation: With patient Time For Goal Achievement: 02/28/23 Potential to Achieve Goals: Good  Plan      Co-evaluation                 AM-PAC OT 6 Clicks Daily Activity     Outcome Measure   Help from another person eating meals?: None Help from another person taking care of personal grooming?: A Little Help from another person toileting, which includes using toliet, bedpan, or urinal?: A Little Help from another person bathing (including washing, rinsing, drying)?: A Lot Help from another person to put on and taking off regular upper body clothing?: A Little Help from another person to put on and taking off regular lower body clothing?: A Lot 6 Click Score: 17    End of Session Equipment Utilized During Treatment: Rolling walker (2 wheels)  OT Visit Diagnosis: Other abnormalities of gait and mobility (R26.89);Muscle weakness (generalized) (M62.81)   Activity Tolerance Patient tolerated treatment well   Patient Left in chair;with call bell/phone within reach;with chair alarm set   Nurse Communication          Time: 8396-8382 OT Time Calculation (min): 14 min  Charges: OT General Charges $OT Visit: 1 Visit OT Treatments $Self Care/Home Management : 8-22 mins  Warren SAUNDERS., MPH, MS, OTR/L ascom [phone] 02/25/23, 4:32 PM

## 2023-02-26 LAB — GLUCOSE, CAPILLARY
Glucose-Capillary: 118 mg/dL — ABNORMAL HIGH (ref 70–99)
Glucose-Capillary: 155 mg/dL — ABNORMAL HIGH (ref 70–99)
Glucose-Capillary: 202 mg/dL — ABNORMAL HIGH (ref 70–99)
Glucose-Capillary: 159 mg/dL — ABNORMAL HIGH (ref 70–99)

## 2023-02-26 NOTE — Plan of Care (Signed)
  Problem: Education: Goal: Knowledge of General Education information will improve Description: Including pain rating scale, medication(s)/side effects and non-pharmacologic comfort measures Outcome: Progressing   Problem: Clinical Measurements: Goal: Ability to maintain clinical measurements within normal limits will improve Outcome: Progressing Goal: Will remain free from infection Outcome: Progressing   Problem: Activity: Goal: Risk for activity intolerance will decrease Outcome: Progressing   Problem: Nutrition: Goal: Adequate nutrition will be maintained Outcome: Progressing   Problem: Elimination: Goal: Will not experience complications related to bowel motility Outcome: Progressing   Problem: Pain Management: Goal: General experience of comfort will improve Outcome: Progressing   Problem: Safety: Goal: Ability to remain free from injury will improve Outcome: Progressing   Problem: Education: Goal: Knowledge of General Education information will improve Description: Including pain rating scale, medication(s)/side effects and non-pharmacologic comfort measures Outcome: Progressing

## 2023-02-26 NOTE — Progress Notes (Signed)
 Progress Note Patient: [name] FMW:[id number] DOB: 09/19/1940 DOA: 02/11/2023     15 DOS: the patient was seen and examined on 02/26/2023   Brief hospital course: 83 year old female with a history of Essential hypertension, heart failure with preserved LVEF, Severe AS s/p TAVR, hypertension, diabetes type 2, diabetic ulcers, fatty liver disease, GERD, chronic dysphagia, history of colon polyps, was admitted and discharged earlier in dec 2024 with b/l LE cellulits and left toe infected ulcer with underlying fracture of phalanx and osteomyelitis.  Presented to ED with hypoxia and was intubated in ED. CPR was performed and obtained ROSC.  Admitted to ICU and required pressor support from presumed septic shock following cardiopulmonary arrest in the ED. TRH assumed care 02/14/23. Cardiology following and treating with IV diuretics. She is slowly weaning her oxygen requirement. LHC on 12/26.   At present, she is on PO diuretics and stable ORA. Cardiology has signed off. Awaiting dc to SNF based on PT/OT recs. Remains stable for discharge.  Assessment and Plan: Acute respiratory failure with hypoxia - intubated 12/20. extubated 12/21 Presumed septic shock - POA, shock physiology is resolved. Blood cultures negative. BAL culture normal respiratory flora. S/p cefuroxime , doxycycline , cefepime , ceftriaxone , azithromycin , vancomycin  at different points in her stay Has now weaned to room air and completed Abx course - continue PT/OT  Cardiopulmonary arrest s/p CPR and ROSC Acute on Chronic HFrEF Hypertension - presented in shock Elevated troponin - demand ischemia.  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 Echo EF 30-35% (prior EF 45% in Aug 2024 at [facility]) Completed 48 hours IV heparin  --Cardiology signed off now --continue Lasix  40 mg PO BID --Strict Io's and daily weights --Monitor renal function, electrolytes - titrating back on antihypertensives. - losartan ,  carvedilol   Musculoskeletal chest pain - status post CPR --Pain control PRN, per orders 1/2 c/o off-and-on chest wall pain most likely musculoskeletal after CPR, continue as needed medication for pain control.  UTI - E coli, Cellulitis- s/p antibiotic course. 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 --Sliding scale Novolog .   Stage II CKD  --Monitor renal function closely with diuresis --Avoid nephrotoxins and hypotension --Renally dose meds  Hyperlipemia --Lipitor  40 mg   Fibromyalgia No acute issues.  Morbid obesity  Body mass index is 48.02 kg/m. Complicates overall care and prognosis.  Recommend lifestyle modifications including physical activity and diet for weight loss and overall long-term health.   NAFLD (nonalcoholic fatty liver disease) No acute issues.     Subjective: No significant overnight events, Patient able to walk around the room with no shorntess of breath.    Physical Exam: Vitals:   02/26/23 0022 02/26/23 0500 02/26/23 0737 02/26/23 1522  BP: (!) 143/106  (!) 116/47 (!) 123/47  Pulse: 61  64 63  Resp: 20  15 16   Temp: 97.7 F (36.5 C)  97.8 F (36.6 C) 98.3 F (36.8 C)  TempSrc: Oral     SpO2: 100%  96% 96%  Weight:  125 kg    Height:       Physical Exam  Constitutional: In no distress.  Cardiovascular: Normal rate, regular rhythm. bilateral lower extremity edema  Pulmonary: Non labored breathing on room air, no wheezing or rales.  Abdominal: Soft. Normal bowel sounds. Non distended and non tender Musculoskeletal: Normal range of motion.  Neurological: Alert and oriented to person, place, and time. Non focal  Skin: Skin is warm and dry.    Data Reviewed:     Latest Ref Rng & Units 02/22/2023     4:39 AM 02/18/2023    5:18 AM 02/17/2023    8:57 AM  CBC  WBC 4.0 - 10.5 K/uL 7.0  8.1    Hemoglobin 12.0 - 15.0 g/dL 9.8  9.9  89.4   Hematocrit 36.0 - 46.0 % 31.6  31.8  31.0   Platelets 150 - 400 K/uL 145  150        Latest Ref Rng & Units 02/22/2023    4:39 AM 02/18/2023    5:18 AM 02/17/2023    8:57 AM  BMP  Glucose 70 - 99 mg/dL 845  851    BUN 8 - 23 mg/dL 52  39    Creatinine 9.55 - 1.00 mg/dL 8.61  8.68    Sodium 864 - 145 mmol/L 138  139  140   Potassium 3.5 - 5.1 mmol/L 4.4  4.2  4.4   Chloride 98 - 111 mmol/L 101  97    CO2 22 - 32 mmol/L 31  31    Calcium  8.9 - 10.3 mg/dL 8.7  8.7     Family Communication: None present at bedside  Disposition: Status is: Inpatient Remains inpatient appropriate because: Needs SNF placement, follow TOC,  1/2 Facility has to start insurance Auth again as per [facility]    Planned Discharge Destination: Skilled nursing facility     Time spent: 40 minutes  Author: Alban Pepper, MD 02/26/2023 7:41 PM  For on call review [email or web address].

## 2023-02-26 NOTE — Plan of Care (Signed)

## 2023-02-26 NOTE — Plan of Care (Signed)
 Patient alert and oriented. Complained of chest soreness, medicated with relief. Patient did not want to lay in bed, she would rather sleep in recliner. Patient stated she was comfortable upon assessment. Will continue to monitor.   Problem: Education: Goal: Knowledge of General Education information will improve Description: Including pain rating scale, medication(s)/side effects and non-pharmacologic comfort measures Outcome: Progressing   Problem: Health Behavior/Discharge Planning: Goal: Ability to manage health-related needs will improve Outcome: Progressing   Problem: Clinical Measurements: Goal: Ability to maintain clinical measurements within normal limits will improve Outcome: Progressing Goal: Will remain free from infection Outcome: Progressing Goal: Diagnostic test results will improve Outcome: Progressing Goal: Respiratory complications will improve Outcome: Progressing Goal: Cardiovascular complication will be avoided Outcome: Progressing   Problem: Activity: Goal: Risk for activity intolerance will decrease Outcome: Progressing   Problem: Nutrition: Goal: Adequate nutrition will be maintained Outcome: Progressing   Problem: Coping: Goal: Level of anxiety will decrease Outcome: Progressing   Problem: Elimination: Goal: Will not experience complications related to bowel motility Outcome: Progressing Goal: Will not experience complications related to urinary retention Outcome: Progressing   Problem: Pain Management: Goal: General experience of comfort will improve Outcome: Progressing   Problem: Safety: Goal: Ability to remain free from injury will improve Outcome: Progressing   Problem: Skin Integrity: Goal: Risk for impaired skin integrity will decrease Outcome: Progressing   Problem: Activity: Goal: Ability to tolerate increased activity will improve Outcome: Progressing   Problem: Respiratory: Goal: Ability to maintain a clear airway and  adequate ventilation will improve Outcome: Progressing   Problem: Role Relationship: Goal: Method of communication will improve Outcome: Progressing   Problem: Education: Goal: Ability to describe self-care measures that may prevent or decrease complications (Diabetes Survival Skills Education) will improve Outcome: Progressing Goal: Individualized Educational Video(s) Outcome: Progressing   Problem: Coping: Goal: Ability to adjust to condition or change in health will improve Outcome: Progressing   Problem: Fluid Volume: Goal: Ability to maintain a balanced intake and output will improve Outcome: Progressing   Problem: Health Behavior/Discharge Planning: Goal: Ability to identify and utilize available resources and services will improve Outcome: Progressing Goal: Ability to manage health-related needs will improve Outcome: Progressing   Problem: Metabolic: Goal: Ability to maintain appropriate glucose levels will improve Outcome: Progressing   Problem: Nutritional: Goal: Maintenance of adequate nutrition will improve Outcome: Progressing Goal: Progress toward achieving an optimal weight will improve Outcome: Progressing   Problem: Skin Integrity: Goal: Risk for impaired skin integrity will decrease Outcome: Progressing   Problem: Tissue Perfusion: Goal: Adequacy of tissue perfusion will improve Outcome: Progressing   Problem: Education: Goal: Knowledge of General Education information will improve Description: Including pain rating scale, medication(s)/side effects and non-pharmacologic comfort measures Outcome: Progressing   Problem: Health Behavior/Discharge Planning: Goal: Ability to manage health-related needs will improve Outcome: Progressing   Problem: Clinical Measurements: Goal: Ability to maintain clinical measurements within normal limits will improve Outcome: Progressing Goal: Will remain free from infection Outcome: Progressing Goal: Diagnostic  test results will improve Outcome: Progressing Goal: Respiratory complications will improve Outcome: Progressing Goal: Cardiovascular complication will be avoided Outcome: Progressing   Problem: Activity: Goal: Risk for activity intolerance will decrease Outcome: Progressing   Problem: Nutrition: Goal: Adequate nutrition will be maintained Outcome: Progressing   Problem: Coping: Goal: Level of anxiety will decrease Outcome: Progressing   Problem: Elimination: Goal: Will not experience complications related to bowel motility Outcome: Progressing Goal: Will not experience complications related to urinary retention Outcome: Progressing   Problem:  Pain Management: Goal: General experience of comfort will improve Outcome: Progressing   Problem: Safety: Goal: Ability to remain free from injury will improve Outcome: Progressing   Problem: Skin Integrity: Goal: Risk for impaired skin integrity will decrease Outcome: Progressing

## 2023-02-27 LAB — GLUCOSE, CAPILLARY
Glucose-Capillary: 202 mg/dL — ABNORMAL HIGH (ref 70–99)
Glucose-Capillary: 161 mg/dL — ABNORMAL HIGH (ref 70–99)

## 2023-02-27 LAB — BASIC METABOLIC PANEL
Anion gap: 10 (ref 5–15)
BUN: 48 mg/dL — ABNORMAL HIGH (ref 8–23)
CO2: 26 mmol/L (ref 22–32)
Calcium: 8.5 mg/dL — ABNORMAL LOW (ref 8.9–10.3)
Chloride: 102 mmol/L (ref 98–111)
Creatinine, Ser: 1.11 mg/dL — ABNORMAL HIGH (ref 0.44–1.00)
GFR, Estimated: 50 mL/min — ABNORMAL LOW (ref >60)
Glucose, Bld: 132 mg/dL — ABNORMAL HIGH (ref 70–99)
Potassium: 4.3 mmol/L (ref 3.5–5.1)
Sodium: 138 mmol/L (ref 135–145)

## 2023-02-27 LAB — CBC
HCT: 30.5 % — ABNORMAL LOW (ref 36.0–46.0)
Hemoglobin: 9.4 g/dL — ABNORMAL LOW (ref 12.0–15.0)
MCH: 27.8 pg (ref 26.0–34.0)
MCHC: 30.8 g/dL (ref 30.0–36.0)
MCV: 90.2 fL (ref 80.0–100.0)
Platelets: 163 10*3/uL (ref 150–400)
RBC: 3.38 MIL/uL — ABNORMAL LOW (ref 3.87–5.11)
RDW: 15.7 % — ABNORMAL HIGH (ref 11.5–15.5)
WBC: 8.6 10*3/uL (ref 4.0–10.5)
nRBC: 0 % (ref 0.0–0.2)

## 2023-02-27 LAB — IRON AND TIBC
Iron: 46 ug/dL (ref 28–170)
Saturation Ratios: 12 % (ref 10.4–31.8)
TIBC: 386 ug/dL (ref 250–450)
UIBC: 340 ug/dL

## 2023-02-27 LAB — FERRITIN: Ferritin: 46 ng/mL (ref 11–307)

## 2023-02-27 NOTE — Progress Notes (Signed)
 Progress Note Patient: [name] FMW:[id number] DOB: 1940-04-15 DOA: 02/11/2023     16 DOS: the patient was seen and examined on 02/27/2023   Brief hospital course: 83 year old female with a history of Essential hypertension, heart failure with preserved LVEF, Severe AS s/p TAVR, hypertension, diabetes type 2, diabetic ulcers, fatty liver disease, GERD, chronic dysphagia, history of colon polyps, was admitted and discharged earlier in dec 2024 with b/l LE cellulits and left toe infected ulcer with underlying fracture of phalanx and osteomyelitis.  Presented to ED with hypoxia and was intubated in ED. CPR was performed and obtained ROSC.  Admitted to ICU and required pressor support from presumed septic shock following cardiopulmonary arrest in the ED. TRH assumed care 02/14/23. Cardiology following and treating with IV diuretics. She is slowly weaning her oxygen requirement. LHC on 12/26.   At present, she is on PO diuretics and stable ORA. Cardiology has signed off. Awaiting dc to SNF based on PT/OT recs. Remains stable for discharge.  Assessment and Plan: Acute respiratory failure with hypoxia - intubated 12/20. extubated 12/21 Presumed septic shock - POA, shock physiology is resolved. Blood cultures negative. BAL culture normal respiratory flora. S/p cefuroxime , doxycycline , cefepime , ceftriaxone , azithromycin , vancomycin  at different points in her stay Has now weaned to room air and completed Abx course - continue PT/OT  Cardiopulmonary arrest s/p CPR and ROSC Acute on Chronic HFrEF Hypertension - presented in shock Elevated troponin - demand ischemia.  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 Echo EF 30-35% (prior EF 45% in Aug 2024 at [facility]) Completed 48 hours IV heparin  --Cardiology signed off now --continue Lasix  40 mg PO BID --Strict Io's and daily weights --Monitor renal function, electrolytes - titrating back on antihypertensives. - losartan ,  carvedilol   Musculoskeletal chest pain - status post CPR --Pain control PRN, per orders 1/2 c/o off-and-on chest wall pain most likely musculoskeletal after CPR, continue as needed medication for pain control.  UTI - E coli, Cellulitis- s/p antibiotic course. Asymptomatic   Venous insufficiency  Ordered Ace wraps for both legs.   Bilateral lower extremity cellulitis Resolved.  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 --Sliding scale Novolog .   Stage II CKD  --Monitor renal function closely with diuresis --Avoid nephrotoxins and hypotension --Renally dose meds  Hyperlipemia --Lipitor  40 mg   Fibromyalgia No acute issues.  Morbid obesity  Body mass index is 48.02 kg/m. Complicates overall care and prognosis.  Recommend lifestyle modifications including physical activity and diet for weight loss and overall long-term health.   NAFLD (nonalcoholic fatty liver disease) No acute issues.     Subjective: No significant overnight events. Some mild chest discomfort where she received CPR. Denies SOB. [name]    Physical Exam: Vitals:   02/26/23 1522 02/26/23 2326 02/27/23 0746 02/27/23 1535  BP: (!) 123/47 (!) 142/43 (!) 101/51 (!) 105/51  Pulse: 63 62 67 (!) 58  Resp: 16 20 18 17   Temp: 98.3 F (36.8 C) 97.7 F (36.5 C) 97.6 F (36.4 C) 97.6 F (36.4 C)  TempSrc:  Oral Oral   SpO2: 96% 92% 98% 100%  Weight:      Height:       Physical Exam  Constitutional: In no distress.  Cardiovascular: Normal rate, regular rhythm. 1-2+ bilateral lower extremity edema  Pulmonary: Non labored breathing on room air, no wheezing or  rales.   Abdominal: Soft. Normal bowel sounds. Non distended and non tender Musculoskeletal: Normal range of motion.     Neurological: Alert and oriented to person, place, and  time. Non focal  Skin: Skin is warm and dry.     Data Reviewed:     Latest Ref Rng & Units 02/27/2023    5:02 AM 02/22/2023    4:39 AM 02/18/2023    5:18 AM  CBC  WBC 4.0 - 10.5 K/uL 8.6  7.0  8.1   Hemoglobin 12.0 - 15.0 g/dL 9.4  9.8  9.9   Hematocrit 36.0 - 46.0 % 30.5  31.6  31.8   Platelets 150 - 400 K/uL 163  145  150       Latest Ref Rng & Units 02/27/2023    5:02 AM 02/22/2023    4:39 AM 02/18/2023    5:18 AM  BMP  Glucose 70 - 99 mg/dL 867  845  851   BUN 8 - 23 mg/dL 48  52  39   Creatinine 0.44 - 1.00 mg/dL 8.88  8.61  8.68   Sodium 135 - 145 mmol/L 138  138  139   Potassium 3.5 - 5.1 mmol/L 4.3  4.4  4.2   Chloride 98 - 111 mmol/L 102  101  97   CO2 22 - 32 mmol/L 26  31  31    Calcium  8.9 - 10.3 mg/dL 8.5  8.7  8.7    Family Communication: None present at bedside  Disposition: Status is: Inpatient Remains inpatient appropriate because: Needs SNF placement, follow TOC,  1/2 Facility has to start insurance Auth again as per [facility] as of 1/3 awaiting insurance authorization     Planned Discharge Destination: Skilled nursing facility     Time spent: 35 minutes  Author: Alban Pepper, MD 02/27/2023 4:57 PM  For on call review [email or web address].

## 2023-02-27 NOTE — Progress Notes (Signed)
 A consult was placed to the IV Nurse for new IV access;  pt noted to have several visible veins without a tourniquet;  pt is requesting at this time don't stick me unless I really have to have it;  Will Hold iv restart at this time per pt request. Pt had PO pain med earlier and rates her pain at  4 or 5 instead of 8 like before; IV Team is available if pt requires iv access for medications or procedures.

## 2023-02-27 NOTE — Plan of Care (Signed)
  Problem: Education: Goal: Knowledge of General Education information will improve Description: Including pain rating scale, medication(s)/side effects and non-pharmacologic comfort measures Outcome: Progressing   Problem: Pain Management: Goal: General experience of comfort will improve Outcome: Progressing   Problem: Safety: Goal: Ability to remain free from injury will improve Outcome: Progressing

## 2023-02-27 NOTE — Plan of Care (Signed)

## 2023-02-28 LAB — GLUCOSE, CAPILLARY
Glucose-Capillary: 135 mg/dL — ABNORMAL HIGH (ref 70–99)
Glucose-Capillary: 224 mg/dL — ABNORMAL HIGH (ref 70–99)
Glucose-Capillary: 144 mg/dL — ABNORMAL HIGH (ref 70–99)

## 2023-02-28 NOTE — Progress Notes (Signed)
 Progress Note   Patient: [name] FMW:[id number] DOB: 1940-03-11 DOA: 02/11/2023     17 DOS: the patient was seen and examined on 02/28/2023   Brief hospital course: 83 year old female with a history of Essential hypertension, heart failure with preserved LVEF, Severe AS s/p TAVR, hypertension, diabetes type 2, diabetic ulcers, fatty liver disease, GERD, chronic dysphagia, history of colon polyps, was admitted and discharged earlier in dec 2024 with b/l LE cellulits and left toe infected ulcer with underlying fracture of phalanx and osteomyelitis.  Presented to ED with hypoxia and was intubated in ED. CPR was performed and obtained ROSC.  Admitted to ICU and required pressor support from presumed septic shock following cardiopulmonary arrest in the ED. TRH assumed care 02/14/23. Cardiology following and treating with IV diuretics. She is slowly weaning her oxygen requirement. LHC on 12/26.    At present, she is on PO diuretics and stable ORA. Cardiology has signed off. Awaiting dc to SNF based on PT/OT recs. Remains stable for discharge.   Assessment and Plan: Acute respiratory failure with hypoxia - intubated 12/20. extubated 12/21 Presumed septic shock - POA, shock physiology is resolved. Blood cultures negative. BAL culture normal respiratory flora. S/p cefuroxime , doxycycline , cefepime , ceftriaxone , azithromycin , vancomycin  at different points in her stay Has now weaned to room air and completed Abx course Continue PT OT   Cardiopulmonary arrest s/p CPR and ROSC Acute on Chronic HFrEF Hypertension - presented in shock Elevated troponin - demand ischemia.  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 Echo EF 30-35% (prior EF 45% in Aug 2024 at [facility]) Completed 48 hours IV heparin  Cardiology signed off Continue Lasix  40 mg PO BID Continue strict Io's and daily weights Monitor electrolytes - titrating back on antihypertensives. - losartan , carvedilol    Musculoskeletal chest  pain - status post CPR --Pain control PRN, per orders 1/2 c/o off-and-on chest wall pain most likely musculoskeletal after CPR, continue as needed medication for pain control.   UTI - E coli, Cellulitis- s/p antibiotic course. Asymptomatic    Venous insufficiency  Ordered Ace wraps for both legs.    Bilateral lower extremity cellulitis Resolved.  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 --Sliding scale Novolog .    Stage II CKD  --Monitor renal function closely with diuresis --Avoid nephrotoxins and hypotension --Renally dose meds   Hyperlipemia Continue-Lipitor  40 mg   Fibromyalgia No acute issues.   Morbid obesity  Body mass index is 48.02 kg/m. Complicates overall care and prognosis.  Recommend lifestyle modifications including physical activity and diet for weight loss and overall long-term health.   NAFLD (nonalcoholic fatty liver disease) No acute issues.       Subjective:  She is seen and examined at bedside. still awaiting skilled nursing facility placement denies nausea vomiting abdominal pain chest pain or cough  case manager working on placement    Physical Exam  Constitutional: Elderly female laying in bed in no distress Cardiovascular: Normal rate, regular rhythm. 1-2+ bilateral lower extremity edema  Pulmonary: Non labored breathing on room air, no wheezing or rales.   Abdominal: Soft. Normal bowel sounds. Non distended and non tender Musculoskeletal: Normal range of motion.     Neurological: Alert and oriented to person, place, and time. Non focal  Skin: Skin is warm and dry.        Data Reviewed:  Latest Ref Rng & Units 02/27/2023    5:02 AM 02/22/2023    4:39 AM 02/18/2023    5:18 AM  CBC  WBC 4.0 - 10.5 K/uL 8.6  7.0  8.1   Hemoglobin 12.0 - 15.0 g/dL 9.4  9.8  9.9    Hematocrit 36.0 - 46.0 % 30.5  31.6  31.8   Platelets 150 - 400 K/uL 163  145  150        Latest Ref Rng & Units 02/27/2023    5:02 AM 02/22/2023    4:39 AM 02/18/2023    5:18 AM  BMP  Glucose 70 - 99 mg/dL 867  845  851   BUN 8 - 23 mg/dL 48  52  39   Creatinine 0.44 - 1.00 mg/dL 8.88  8.61  8.68   Sodium 135 - 145 mmol/L 138  138  139   Potassium 3.5 - 5.1 mmol/L 4.3  4.4  4.2   Chloride 98 - 111 mmol/L 102  101  97   CO2 22 - 32 mmol/L 26  31  31    Calcium  8.9 - 10.3 mg/dL 8.5  8.7  8.7     Vitals:   02/28/23 0100 02/28/23 0500 02/28/23 0736 02/28/23 1555  BP: (!) 122/40  133/72 (!) 102/32  Pulse: 62  63 (!) 57  Resp: 18  16 16   Temp: 97.8 F (36.6 C)  98.1 F (36.7 C) (!) 97.5 F (36.4 C)  TempSrc: Oral     SpO2: 97%  97% 100%  Weight:  126 kg    Height:         Author: Drue ONEIDA Potter, MD 02/28/2023 4:06 PM  For on call review [email or web address].

## 2023-02-28 NOTE — Evaluation (Addendum)
 Occupational Therapy Re-Evaluation Patient Details Name: [name] MRN: [id number] DOB: [birth year] Today's Date: 02/28/2023   History of Present Illness Pt is an 83 y/o F admitted on 02/11/23. Pt was d/c earlier in Dec. 2024 after being treated for BLE cellulitis & L toe infected ulcer with underlying fx of phalanx & osteomyelitis. Pt was on the way to the hospital for an appointment & found to be hypoxic. Pt required intubation for severe hypoxemia & developed circulatory shock. Pt was extubated 02/12/23. PMH: essential HTN, HF with preserved LVEF, severe AS s/p TAVR, HTN, DM2, diabetic ulcers, fatty liver disease, GERD, chronic dysphagia, colon polyps, L great toe amputation 01/24/23   Clinical Impression   Pt seen for re-evaluation this date. Pt received in the recliner, agreeable to session. Pt denies pain. Completes ADL transfers from recliner and EOB with supv/SBA + RW. Pt ambulated in the room ~10' to sink for grooming tasks requiring intermittent UE vs BUE support on the counter for stability. Afterwards, pt noted to be somewhat SOB. SpO2 98% on RA, HR 66.  Pt educated in ECS including activity pacing, work simplification, Ae/DME, and home/routines modifications to help pt prioritize activities while minimizing risk of over exertion. Pt verbalized understanding. Pt continues to benefit from skilled OT services to maximize return to PLOF.       If plan is discharge home, recommend the following: A lot of help with bathing/dressing/bathroom;Assistance with cooking/housework;Assist for transportation;Help with stairs or ramp for entrance;Direct supervision/assist for medications management;Supervision due to cognitive status;A little help with walking and/or transfers    Functional Status Assessment  Patient has had a recent decline in their functional status and demonstrates the ability to make significant improvements in function in a reasonable and predictable amount of time.  Equipment  Recommendations  Other (comment) (defer)    Recommendations for Other Services       Precautions / Restrictions Precautions Precautions: Fall Restrictions Weight Bearing Restrictions Per Provider Order: Yes LLE Weight Bearing Per Provider Order: Weight bearing as tolerated      Mobility Bed Mobility               General bed mobility comments: sitting in recliner upon arrival.    Transfers Overall transfer level: Needs assistance Equipment used: Rolling walker (2 wheels) Transfers: Sit to/from Stand Sit to Stand: Supervision                  Balance Overall balance assessment: Needs assistance Sitting-balance support: Feet supported Sitting balance-Leahy Scale: Good     Standing balance support: Bilateral upper extremity supported, During functional activity, Reliant on assistive device for balance, Single extremity supported, No upper extremity supported Standing balance-Leahy Scale: Fair Standing balance comment: able to complete grooming at sink with intermittent UE vs BUE support on the counter                           ADL either performed or assessed with clinical judgement   ADL Overall ADL's : Needs assistance/impaired     Grooming: Standing;Supervision/safety;Wash/dry hands;Oral care;Cueing for compensatory techniques                               Functional mobility during ADLs: Contact guard assist;Rolling walker (2 wheels);Supervision/safety       Vision         Perception         Praxis  Pertinent Vitals/Pain Pain Assessment Pain Assessment: No/denies pain     Extremity/Trunk Assessment Upper Extremity Assessment Upper Extremity Assessment: Generalized weakness   Lower Extremity Assessment Lower Extremity Assessment: Generalized weakness       Communication Communication Communication: No apparent difficulties   Cognition Arousal: Alert Behavior During Therapy: WFL for tasks  assessed/performed Overall Cognitive Status: History of cognitive impairments - at baseline Area of Impairment: Memory                     Memory: Decreased short-term memory               General Comments  On room air, SpO2 after exertion 98%, HR 66    Exercises Other Exercises Other Exercises: Pt educated in ECS including activity pacing, work simplification, Ae/DME, and home/routines modifications to help pt prioritize activities while minimizing risk of over exertion.   Shoulder Instructions      Home Living Family/patient expects to be discharged to:: Private residence Living Arrangements: Spouse/significant other (reports she lives with son) Available Help at Discharge: Family Type of Home: House Home Access: Stairs to enter [employer] of Steps: 2-3 Entrance Stairs-Rails: Right Home Layout: Two level;Able to live on main level with bedroom/bathroom     Bathroom Shower/Tub: Walk-in shower         Home Equipment: Cane - single Librarian, Academic (2 wheels);Rollator (4 wheels)          Prior Functioning/Environment Prior Level of Function : Independent/Modified Independent             Mobility Comments: ambulatory with rollator x several years, denies falls, no longer drives ADLs Comments: independent with bathing & dressing, light cooking & cleaning but primarily eats out        OT Problem List: Decreased strength;Cardiopulmonary status limiting activity;Decreased activity tolerance;Impaired balance (sitting and/or standing);Decreased knowledge of use of DME or AE;Decreased cognition;Obesity      OT Treatment/Interventions: Self-care/ADL training;Therapeutic exercise;Therapeutic activities;Energy conservation;DME and/or AE instruction;Patient/family education;Balance training    OT Goals(Current goals can be found in the care plan section) Acute Rehab OT Goals Patient Stated Goal: get stronger before going home OT Goal  Formulation: With patient Time For Goal Achievement: 03/14/23 Potential to Achieve Goals: Good ADL Goals Pt Will Perform Lower Body Dressing: with modified independence;with adaptive equipment;sit to/from stand Pt Will Transfer to Toilet: with modified independence;ambulating (LRAD)  OT Frequency: Min 1X/week    Co-evaluation              AM-PAC OT 6 Clicks Daily Activity     Outcome Measure Help from another person eating meals?: None Help from another person taking care of personal grooming?: A Little Help from another person toileting, which includes using toliet, bedpan, or urinal?: A Little Help from another person bathing (including washing, rinsing, drying)?: A Lot Help from another person to put on and taking off regular upper body clothing?: A Little Help from another person to put on and taking off regular lower body clothing?: A Lot 6 Click Score: 17   End of Session Equipment Utilized During Treatment: Rolling walker (2 wheels)  Activity Tolerance: Patient tolerated treatment well Patient left: in chair;with call bell/phone within reach;with chair alarm set  OT Visit Diagnosis: Other abnormalities of gait and mobility (R26.89);Muscle weakness (generalized) (M62.81)                Time: 8490-8472 OT Time Calculation (min): 18 min Charges:  OT General Charges $OT Visit: 1  Visit OT Evaluation $OT Re-eval: 1 Re-eval  Warren SAUNDERS., MPH, MS, OTR/L ascom [phone] 02/28/23, 3:37 PM

## 2023-02-28 NOTE — TOC Progression Note (Signed)
 Transition of Care [facility]) - Progression Note    Patient Details  Name: [name] MRN: [id number] Date of Birth: 1940/06/14  Transition of Care [facility]) CM/SW Contact  Cyann Venti A Taishaun Levels, RN Phone Number: 02/28/2023, 11:06 AM  Clinical Narrative:    Chart reviewed.  Spoken with Dylan, Admissions staff from [facility].  He informs me that SNF authorization is pending and insurance company has requested additional PT/OT notes.  I have asked PT and OT to see patient for updated PT/OT notes.   TOC will continue to follow for discharge planning.       Expected Discharge Plan: Skilled Nursing Facility Barriers to Discharge: Continued Medical Work up  Expected Discharge Plan and Services     Post Acute Care Choice: Skilled Nursing Facility Living arrangements for the past 2 months: Single Family Home Expected Discharge Date: 02/24/23                                     Social Determinants of Health (SDOH) Interventions SDOH Screenings   Food Insecurity: No Food Insecurity (02/19/2023)  Housing: Low Risk  (02/19/2023)  Transportation Needs: No Transportation Needs (02/19/2023)  Utilities: Not At Risk (02/19/2023)  Alcohol Screen: Low Risk  (02/18/2023)  Financial Resource Strain: Medium Risk (02/18/2023)  Social Connections: Moderately Isolated (02/22/2023)  Stress: No Stress Concern Present (10/22/2022)   Received from [facility]  Tobacco Use: Medium Risk (02/18/2023)  Health Literacy: Low Risk  (10/22/2022)   Received from [facility]    Readmission Risk Interventions     No data to display

## 2023-02-28 NOTE — Plan of Care (Signed)

## 2023-02-28 NOTE — Progress Notes (Addendum)
 Physical Therapy Treatment Patient Details Name: [name] MRN: [id number] DOB: [birth year] Today's Date: 02/28/2023   History of Present Illness Pt is an 83 y/o F admitted on 02/11/23. Pt was d/c earlier in Dec. 2024 after being treated for BLE cellulitis & L toe infected ulcer with underlying fx of phalanx & osteomyelitis. Pt was on the way to the hospital for an appointment & found to be hypoxic. Pt required intubation for severe hypoxemia & developed circulatory shock. Pt was extubated 02/12/23. 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 Pt appears slightly confused this session. Able to perform 5TSTS in 35 seconds demonstrating poor endurance/strength. Continue to progress as able. Goals to continue.   If plan is discharge home, recommend the following: Assistance with cooking/housework;Assist for transportation;Help with stairs or ramp for entrance;A little help with walking and/or transfers;A little help with bathing/dressing/bathroom   Can travel by private vehicle     Yes  Equipment Recommendations  Other (comment)    Recommendations for Other Services       Precautions / Restrictions Precautions Precautions: Fall Restrictions Weight Bearing Restrictions Per Provider Order: No LLE Weight Bearing Per Provider Order: Weight bearing as tolerated     Mobility  Bed Mobility               General bed mobility comments: sitting in recliner upon arrival.    Transfers Overall transfer level: Needs assistance Equipment used: Rolling walker (2 wheels) Transfers: Sit to/from Stand Sit to Stand: Supervision           General transfer comment: safe technique with safe use of B hands. Once standing, upright posture noted     Ambulation/Gait Ambulation/Gait assistance: Contact guard assist Gait Distance (Feet): 40 Feet Assistive device: Rolling walker (2 wheels) Gait Pattern/deviations: Step-through pattern, Decreased step length - right, Decreased step length - left, Trunk flexed       General Gait Details: ambulated in room. Declined to go into hallway. RW used with safe technique   Stairs             Wheelchair Mobility     Tilt Bed    Modified Rankin (Stroke Patients Only)       Balance Overall balance assessment: Needs assistance Sitting-balance support: Feet supported Sitting balance-Leahy Scale: Good     Standing balance support: Bilateral upper extremity supported, During functional activity, Reliant on assistive device for balance Standing balance-Leahy Scale: Fair                              Cognition Arousal: Alert Behavior During Therapy: WFL for tasks assessed/performed Overall Cognitive Status: History of cognitive impairments - at baseline                                 General Comments: pleasant and agreeable to session        Exercises Other Exercises Other Exercises: 5 time sit<>Stand performed in 35 seconds with use of B UE    General Comments        Pertinent Vitals/Pain Pain Assessment Pain Assessment: No/denies pain    Home Living  Prior Function            PT Goals (current goals can now be found in the care plan section) Acute Rehab PT Goals Patient Stated Goal: get better PT Goal Formulation: With patient Time For Goal Achievement: 02/27/23 Potential to Achieve Goals: Good Progress towards PT goals: Progressing toward goals    Frequency    Min 1X/week      PT Plan      Co-evaluation              AM-PAC PT 6 Clicks Mobility   Outcome Measure  Help needed turning from your back to your side while in a flat bed without using bedrails?: A Little Help  needed moving from lying on your back to sitting on the side of a flat bed without using bedrails?: A Little Help needed moving to and from a bed to a chair (including a wheelchair)?: A Little Help needed standing up from a chair using your arms (e.g., wheelchair or bedside chair)?: A Little Help needed to walk in hospital room?: A Little Help needed climbing 3-5 steps with a railing? : A Lot 6 Click Score: 17    End of Session   Activity Tolerance: Patient tolerated treatment well Patient left: in chair;with call bell/phone within reach;with chair alarm set Nurse Communication: Mobility status PT Visit Diagnosis: Other abnormalities of gait and mobility (R26.89);Muscle weakness (generalized) (M62.81);Difficulty in walking, not elsewhere classified (R26.2)     Time: 8589-8581 PT Time Calculation (min) (ACUTE ONLY): 8 min  Charges:    $Gait Training: 8-22 mins PT General Charges $$ ACUTE PT VISIT: 1 Visit                     [name], PT, DPT, GCS [phone]    Alija Riano 02/28/2023, 2:44 PM

## 2023-03-01 ENCOUNTER — Encounter: Payer: Medicare Other | Admitting: Adult Health

## 2023-03-01 LAB — GLUCOSE, CAPILLARY
Glucose-Capillary: 112 mg/dL — ABNORMAL HIGH (ref 70–99)
Glucose-Capillary: 143 mg/dL — ABNORMAL HIGH (ref 70–99)
Glucose-Capillary: 184 mg/dL — ABNORMAL HIGH (ref 70–99)

## 2023-03-01 NOTE — TOC Progression Note (Signed)
 Transition of Care [facility]) - Progression Note    Patient Details  Name: [name] MRN: [id number] Date of Birth: 09-04-1940  Transition of Care [facility]) CM/SW Contact  Royanne JINNY Bernheim, RN Phone Number: 03/01/2023, 3:08 PM  Clinical Narrative:     Ins approved to go to [facility] can not transport today but have arranged for tomorrow at 10 AM   Expected Discharge Plan: Skilled Nursing Facility Barriers to Discharge: Continued Medical Work up  Expected Discharge Plan and Services     Post Acute Care Choice: Skilled Nursing Facility Living arrangements for the past 2 months: Single Family Home Expected Discharge Date: 02/24/23                                     Social Determinants of Health (SDOH) Interventions SDOH Screenings   Food Insecurity: No Food Insecurity (02/19/2023)  Housing: Low Risk  (02/19/2023)  Transportation Needs: No Transportation Needs (02/19/2023)  Utilities: Not At Risk (02/19/2023)  Alcohol Screen: Low Risk  (02/18/2023)  Financial Resource Strain: Medium Risk (02/18/2023)  Social Connections: Moderately Isolated (02/22/2023)  Stress: No Stress Concern Present (10/22/2022)   Received from [facility]  Tobacco Use: Medium Risk (02/18/2023)  Health Literacy: Low Risk  (10/22/2022)   Received from [facility]    Readmission Risk Interventions     No data to display

## 2023-03-01 NOTE — Plan of Care (Signed)

## 2023-03-01 NOTE — Progress Notes (Signed)
 Progress Note   Patient: [name] FMW:[id number] DOB: [birth year] DOA: 02/11/2023     18 DOS: the patient was seen and examined on 03/01/2023     Brief hospital course: 83 year old female with a history of Essential hypertension, heart failure with preserved LVEF, Severe AS s/p TAVR, hypertension, diabetes type 2, diabetic ulcers, fatty liver disease, GERD, chronic dysphagia, history of colon polyps, was admitted and discharged earlier in dec 2024 with b/l LE cellulits and left toe infected ulcer with underlying fracture of phalanx and osteomyelitis.  Presented to ED with hypoxia and was intubated in ED. CPR was performed and obtained ROSC.  Admitted to ICU and required pressor support from presumed septic shock following cardiopulmonary arrest in the ED. TRH assumed care 02/14/23. Cardiology following and treating with IV diuretics. She is slowly weaning her oxygen requirement. LHC on 12/26.    At present, she is on PO diuretics and stable ORA. Cardiology has signed off. Awaiting dc to SNF based on PT/OT recs. Remains stable for discharge.   Assessment and Plan: Acute respiratory failure with hypoxia - intubated 12/20. extubated 12/21 Presumed septic shock - POA, shock physiology is resolved. Blood cultures negative. BAL culture normal respiratory flora. S/p cefuroxime , doxycycline , cefepime , ceftriaxone , azithromycin , vancomycin  at different points in her stay Has now weaned to room air and completed Abx course Continue PT OT   Cardiopulmonary arrest s/p CPR and ROSC Acute on Chronic HFrEF Hypertension - presented in shock Elevated troponin - demand ischemia.  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 Echo EF 30-35% (prior EF 45% in Aug 2024 at [facility]) Completed 48 hours IV heparin  Cardiology signed off Continue Lasix  40 mg PO BID Continue strict Io's and daily weights Monitor electrolytes - titrating back on antihypertensives. - losartan , carvedilol    Musculoskeletal  chest pain - status post CPR --Pain control PRN, per orders 1/2 c/o off-and-on chest wall pain most likely musculoskeletal after CPR, continue as needed medication for pain control.   UTI - E coli, Cellulitis- s/p antibiotic course. Asymptomatic    Venous insufficiency  Continue Ace wraps for both legs.    Bilateral lower extremity cellulitis Resolved.  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 Continue sliding scale Novolog .    Stage II CKD  --Monitor renal function closely with diuresis --Avoid nephrotoxins and hypotension --Renally dose meds   Hyperlipemia Continue Lipitor  40 mg   Fibromyalgia No acute issues.   Morbid obesity  Body mass index is 48.02 kg/m. Complicates overall care and prognosis.  Recommend lifestyle modifications including physical activity and diet for weight loss and overall long-term health.   NAFLD (nonalcoholic fatty liver disease) No acute issues.       Subjective:  She is seen and examined at bedside. Continues to await skilled nursing facility placement Denies nausea vomiting abdominal pain cough     Physical Exam  Constitutional: Elderly female in no acute distress Cardiovascular: Normal rate, regular rhythm. 1-2+ bilateral lower extremity edema  Pulmonary: Non labored breathing on room air, no wheezing or rales.   Abdominal: Soft. Normal bowel sounds. Non distended and non tender Musculoskeletal: Normal range of motion.     Neurological: Alert and oriented to person, place, and time. Non focal  Skin: Skin is warm and dry.        Data  Reviewed:    Latest Ref Rng & Units 02/27/2023    5:02 AM 02/22/2023    4:39 AM 02/18/2023    5:18 AM  CBC  WBC 4.0 - 10.5 K/uL 8.6  7.0  8.1   Hemoglobin 12.0 - 15.0 g/dL 9.4  9.8  9.9    Hematocrit 36.0 - 46.0 % 30.5  31.6  31.8   Platelets 150 - 400 K/uL 163  145  150        Latest Ref Rng & Units 02/27/2023    5:02 AM 02/22/2023    4:39 AM 02/18/2023    5:18 AM  BMP  Glucose 70 - 99 mg/dL 867  845  851   BUN 8 - 23 mg/dL 48  52  39   Creatinine 0.44 - 1.00 mg/dL 8.88  8.61  8.68   Sodium 135 - 145 mmol/L 138  138  139   Potassium 3.5 - 5.1 mmol/L 4.3  4.4  4.2   Chloride 98 - 111 mmol/L 102  101  97   CO2 22 - 32 mmol/L 26  31  31    Calcium  8.9 - 10.3 mg/dL 8.5  8.7  8.7      Vitals:   02/28/23 2322 03/01/23 0500 03/01/23 0831 03/01/23 1439  BP: (!) 131/48  (!) 110/51 (!) 119/36  Pulse: 60  62 63  Resp: 18  17 16   Temp: 98.1 F (36.7 C)  (!) 97.4 F (36.3 C) 97.6 F (36.4 C)  TempSrc: Oral  Oral Oral  SpO2: 98%  97% 99%  Weight:  128.3 kg    Height:         Author: Drue ONEIDA Potter, MD 03/01/2023 2:42 PM  For on call review [email or web address].

## 2023-03-02 LAB — GLUCOSE, CAPILLARY
Glucose-Capillary: 124 mg/dL — ABNORMAL HIGH (ref 70–99)
Glucose-Capillary: 147 mg/dL — ABNORMAL HIGH (ref 70–99)

## 2023-03-02 NOTE — Discharge Summary (Signed)
 Physician Discharge Summary   Patient: [name] MRN: [id number] DOB: Dec 11, 1940  Admit date:     02/11/2023  Discharge date: 03/02/23  Discharge Physician: Drue ONEIDA Potter   PCP: Loring Tanda Mae, MD   Recommendations at discharge:  Follow-up with cardiology  Discharge Diagnoses: Acute respiratory failure with hypoxia - intubated 12/20. extubated 12/21 Presumed septic shock - POA,  Cardiopulmonary arrest s/p CPR and ROSC Acute on Chronic HFrEF Hypertension - presented in shock Elevated troponin - demand ischemia.  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 Echo EF 30-35% (prior EF 45% in Aug 2024 at [facility]) Musculoskeletal chest pain - status post CPR UTI - E coli, Cellulitis-  Venous insufficiency  Bilateral lower extremity cellulitis Resolved.  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  Presented to ED with hypoxia and was intubated in ED. CPR was performed and obtained ROSC.  Admitted to ICU and required pressor support from presumed septic shock following cardiopulmonary arrest in the ED. was seen by cardiologist and was treated with IV diuresis and oxygen was slowly weaned.  Patient underwent left heart catheterization on 02/17/2023 that showed mild nonobstructive coronary artery disease.  Patient currently stable and not being cleared for discharge to skilled nursing  facility.   Consultants: Cardiology Procedures performed: As shown above Disposition: Skilled nursing facility Diet recommendation:  Discharge Diet Orders (From admission, onward)     Start     Ordered   02/24/23 0000  Diet - low sodium heart healthy        02/24/23 1316           Cardiac diet DISCHARGE MEDICATION: Allergies as of 03/02/2023       Reactions   Castor Oil    passed out ? SYNCOPE ?   Sulfonamide Derivatives    Other Reaction(s): Not available   Amoxicillin  Diarrhea, Other (See Comments)   Has patient had a PCN reaction causing immediate rash, facial/tongue/throat swelling, SOB or lightheadedness with hypotension: No Has patient had a PCN reaction causing severe rash involving mucus membranes or skin necrosis: No Has patient had a PCN reaction that required hospitalization: No Has patient had a PCN reaction occurring within the last 10 years: No If all of the above answers are NO, then may proceed with Cephalosporin use.   Demerol [meperidine] Nausea And Vomiting   Hydrocodone Nausea And Vomiting        Medication List     STOP taking these medications    ondansetron  4 MG tablet Commonly known as: ZOFRAN    oxyCODONE -acetaminophen  5-325 MG tablet Commonly known as: PERCOCET/ROXICET       TAKE these medications    acetaminophen  325 MG tablet Commonly known as: TYLENOL  Take 2 tablets (650 mg total) by mouth every 6 (six) hours as needed for mild pain, fever or headache.   atorvastatin  40 MG tablet Commonly known as: LIPITOR  Take 1 tablet by mouth daily.   carvedilol  3.125 MG tablet Commonly known as: COREG  Take 1  tablet (3.125 mg total) by mouth 2 (two) times daily with a meal.   Cranberry 250 MG Tabs Take 250 mg by mouth daily.   empagliflozin 10 MG Tabs tablet Commonly known as: JARDIANCE Take 1 tablet by mouth daily.   enalapril  10 MG tablet Commonly known as: VASOTEC  Take 10 mg by mouth daily.   furosemide  40 MG  tablet Commonly known as: LASIX  Take 1 tablet (40 mg total) by mouth 2 (two) times daily. What changed:  medication strength how much to take when to take this   loratadine  10 MG tablet Commonly known as: CLARITIN  Take 10 mg by mouth daily as needed.   metFORMIN 1000 MG tablet Commonly known as: GLUCOPHAGE Take 1,000 mg by mouth 2 (two) times daily.   potassium chloride  SA 20 MEQ tablet Commonly known as: KLOR-CON  M Take 1 tablet (20 mEq total) by mouth daily. What changed: how much to take   traMADol  50 MG tablet Commonly known as: ULTRAM  Take 1 tablet (50 mg total) by mouth every 8 (eight) hours as needed for moderate pain (pain score 4-6) or severe pain (pain score 7-10).   Vitamin D -3 125 MCG (5000 UT) Tabs Take 5,000 Units by mouth daily.               Discharge Care Instructions  (From admission, onward)           Start     Ordered   02/24/23 0000  Discharge wound care:       Comments: As above   02/24/23 1316            Contact information for follow-up providers     [facility]. Go in 11 day(s).   Specialty: Cardiology Why: Hospital Follow-Up 03/01/23 @ 2:00 PM Please bring all meds to appointment [facility] [address] Floor Free Valet Parking @ the door Contact information: [address] [place]  [address] [phone]             Contact information for after-discharge care     Destination     [facility] .   Service: Skilled Nursing Contact information: [address] [place]  [address] [phone]                    Discharge Exam: Filed Weights   02/26/23 0500 02/28/23 0500 03/01/23 0500  Weight: 125 kg 126 kg 128.3 kg   Constitutional: Elderly female in no acute distress Cardiovascular: Normal rate, regular rhythm. 1-2+ bilateral lower extremity edema  Pulmonary: Non labored breathing on  room air, no wheezing or rales.   Abdominal: Soft. Normal bowel sounds. Non distended and non tender Musculoskeletal: Normal range of motion.     Neurological: Alert and oriented to person, place, and time. Non focal  Skin: Skin is warm and dry.   Condition at discharge: good  The results of significant diagnostics from this hospitalization (including imaging, microbiology, ancillary and laboratory) are listed below for reference.   Imaging Studies: CARDIAC CATHETERIZATION Result Date: 02/17/2023   Prox LAD to Mid LAD lesion is 10% stenosed.   Mid RCA lesion is 10% stenosed. 1.  Mild nonobstructive coronary artery disease. 2.  Mildly elevated left ventricular end-diastolic pressure at 14 mmHg. 3.  Very difficult engagement of the left coronary arteries due to being jailed by the TAVR prosthesis.\ 4.  Minimal gradient across the TAVR prosthesis Recommendations: Medical therapy for  nonischemic cardiomyopathy. I switch IV furosemide  to oral.   ECHOCARDIOGRAM COMPLETE Result Date: 02/12/2023    ECHOCARDIOGRAM REPORT   Patient Name:   [facility] Date of Exam: 02/12/2023 Medical Rec #:  [id number]      Height:       62.0 in Accession #:    [id number]     Weight:       290.1 lb Date of Birth:  Apr 12, 1940      BSA:          2.239 m Patient Age:    82 years       BP:           104/59 mmHg Patient Gender: F              HR:           61 bpm. Exam Location:  [facility] Procedure: 2D Echo and Intracardiac Opacification Agent Indications:     Cardiac Arrest I46.9  History:         Patient has prior history of Echocardiogram examinations, most                  recent 01/28/2021.                  Aortic Valve: 26 mm CoreValve-Evolut Pro prosthetic, stented                  (TAVR) valve is present in the aortic position.  Sonographer:     Thedora Louder RDCS, FASE Referring Phys:  [id number] INGE JONETTA LECHER Diagnosing Phys: Wilbert Bihari MD  Sonographer Comments: Echo performed with patient supine and on artificial  respirator, patient is obese and no subcostal window. Image acquisition challenging due to patient body habitus and Image acquisition challenging due to respiratory motion. IMPRESSIONS  1. Left ventricular ejection fraction, by estimation, is 30 to 35%. The left ventricle has moderately decreased function. The left ventricle demonstrates regional wall motion abnormalities (see scoring diagram/findings for description) and global hypokinesis. There is mild concentric left ventricular hypertrophy. Left ventricular diastolic parameters are consistent with Grade II diastolic dysfunction (pseudonormalization). Elevated left ventricular end-diastolic pressure. There is akinesis of the  left ventricular, apical septal wall.  2. Right ventricular systolic function is normal. The right ventricular size is normal.  3. Left atrial size was severely dilated.  4. The mitral valve is degenerative. No evidence of mitral valve regurgitation. Mild mitral stenosis. The mean mitral valve gradient is 4.0 mmHg. Severe mitral annular calcification.  5. The aortic valve has been repaired/replaced. Perivalvular Aortic valve regurgitation is mild. 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 There is a 26 mm CoreValve-Evolut Pro prosthetic (TAVR) valve present in the aortic position. Aortic regurgitation PHT measures 557 msec. Aortic valve area, by VTI measures 0.93 cm. Aortic valve mean gradient measures 15.0 mmHg. Aortic valve Vmax measures 2.70 m/s. DVI 0.37.  6. The inferior vena cava is normal in size with greater than 50% respiratory variability, suggesting right atrial pressure of 3 mmHg.  7. Compared to echo dated 01/28/2021, LVF has declined but apical inferoseptal wall motion abnormality is unchanged. The mean TAVR gradient has increased from to and DVI has decreased from 0.92 to 0.37. FINDINGS  Left Ventricle: Left ventricular ejection fraction, by estimation, is 30 to 35%. The left ventricle has moderately  decreased function. The left ventricle demonstrates regional wall motion abnormalities. Definity  contrast agent was given IV to delineate the left ventricular endocardial borders.  The left ventricular internal cavity size was normal in size. There is mild concentric left ventricular hypertrophy. Left ventricular diastolic parameters are consistent with Grade II diastolic dysfunction (pseudonormalization). Elevated left ventricular end-diastolic pressure. Right Ventricle: The right ventricular size is normal. No increase in right ventricular wall thickness. Right ventricular systolic function is normal. Left Atrium: Left atrial size was severely dilated. Right Atrium: Right atrial size was normal in size. Pericardium: There is no evidence of pericardial effusion. Mitral Valve: The mitral valve is degenerative in appearance. There is moderate thickening of the mitral valve leaflet(s). There is mild calcification of the mitral valve leaflet(s). Severe mitral annular calcification. No evidence of mitral valve regurgitation. Mild mitral valve stenosis. MV peak gradient, 8.8 mmHg. The mean mitral valve gradient is 4.0 mmHg. Tricuspid Valve: The tricuspid valve is normal in structure. Tricuspid valve regurgitation is trivial. No evidence of tricuspid stenosis. Aortic Valve: The aortic valve has been repaired/replaced. Aortic valve regurgitation is mild. Aortic regurgitation PHT measures 557 msec. Mild to moderate aortic stenosis is present. Aortic valve mean gradient measures 15.0 mmHg. Aortic valve peak gradient measures 29.1 mmHg. Aortic valve area, by VTI measures 0.93 cm. There is a 26 mm CoreValve-Evolut Pro prosthetic, stented (TAVR) valve present in the aortic position. Pulmonic Valve: The pulmonic valve was normal in structure. Pulmonic valve regurgitation is not visualized. No evidence of pulmonic stenosis. Aorta: The aortic root is normal in size and structure. Venous: The inferior vena cava is normal in size  with greater than 50% respiratory variability, suggesting right atrial pressure of 3 mmHg. IAS/Shunts: No atrial level shunt detected by color flow Doppler.  LEFT VENTRICLE PLAX 2D LVIDd:         5.00 cm      Diastology LVIDs:         3.90 cm      LV e' medial:    5.11 cm/s LV PW:         1.20 cm      LV E/e' medial:  24.5 LV IVS:        1.30 cm      LV e' lateral:   4.35 cm/s LVOT diam:     1.80 cm      LV E/e' lateral: 28.7 LV SV:         56 LV SV Index:   25 LVOT Area:     2.54 cm  LV Volumes (MOD) LV vol d, MOD A4C: 126.0 ml LV vol s, MOD A4C: 81.5 ml LV SV MOD A4C:     126.0 ml RIGHT VENTRICLE RV Basal diam:  2.80 cm RV S prime:     13.20 cm/s TAPSE (M-mode): 2.0 cm LEFT ATRIUM              Index        RIGHT ATRIUM           Index LA diam:        5.40 cm  2.41 cm/m   RA Area:     10.90 cm LA Vol (A2C):   173.0 ml 77.26 ml/m  RA Volume:   22.40 ml  10.00 ml/m LA Vol (A4C):   93.1 ml  41.58 ml/m LA Biplane Vol: 134.0 ml 59.84 ml/m  AORTIC VALVE                     PULMONIC VALVE AV Area (Vmax):    0.92 cm      PV Vmax:  0.95 m/s AV Area (Vmean):   0.92 cm      PV Peak grad:  3.6 mmHg AV Area (VTI):     0.93 cm AV Vmax:           269.67 cm/s AV Vmean:          181.333 cm/s AV VTI:            0.599 m AV Peak Grad:      29.1 mmHg AV Mean Grad:      15.0 mmHg LVOT Vmax:         97.40 cm/s LVOT Vmean:        65.400 cm/s LVOT VTI:          0.220 m LVOT/AV VTI ratio: 0.37 AI PHT:            557 msec  AORTA Ao Root diam: 2.50 cm Ao Asc diam:  3.20 cm MITRAL VALVE                TRICUSPID VALVE MV Area (PHT): 1.82 cm     TV Peak grad:   34.6 mmHg MV Area VTI:   1.18 cm     TV Vmax:        2.94 m/s MV Peak grad:  8.8 mmHg MV Mean grad:  4.0 mmHg     SHUNTS MV Vmax:       1.48 m/s     Systemic VTI:  0.22 m MV Vmean:      91.2 cm/s    Systemic Diam: 1.80 cm MV Decel Time: 416 msec MV E velocity: 125.00 cm/s MV A velocity: 126.00 cm/s MV E/A ratio:  0.99 Wilbert Bihari MD Electronically signed by Wilbert Bihari  MD Signature Date/Time: 02/12/2023/4:31:48 PM    Final    DG Chest Port 1 View Result Date: 02/11/2023 CLINICAL DATA:  Central line EXAM: PORTABLE CHEST 1 VIEW COMPARISON:  02/11/2023, CT 02/11/2023 FINDINGS: Endotracheal tube tip is about 2.8 cm superior to the carina. Esophageal tube tip below the diaphragm but incompletely visualized. Left IJ central venous catheter tip over the SVC confluence. Cardiomegaly with vascular congestion. Patchy left greater than right basilar airspace disease. No pneumothorax IMPRESSION: 1. Left IJ central venous catheter tip over the SVC confluence. No pneumothorax. 2. Cardiomegaly with vascular congestion. Patchy left greater than right basilar airspace disease, atelectasis versus pneumonia. This appears worse compared to prior Electronically Signed   By: Luke Bun M.D.   On: 02/11/2023 21:00   CT Angio Chest PE W/Cm &/Or Wo Cm Result Date: 02/11/2023 CLINICAL DATA:  Respiratory distress. Status post arrest. Status post CPR. Clinical concern for pulmonary embolus. EXAM: CT ANGIOGRAPHY CHEST WITH CONTRAST TECHNIQUE: Multidetector CT imaging of the chest was performed using the standard protocol during bolus administration of intravenous contrast. Multiplanar CT image reconstructions and MIPs were obtained to evaluate the vascular anatomy. RADIATION DOSE REDUCTION: This exam was performed according to the departmental dose-optimization program which includes automated exposure control, adjustment of the mA and/or kV according to patient size and/or use of iterative reconstruction technique. CONTRAST:  75mL OMNIPAQUE  IOHEXOL  350 MG/ML SOLN COMPARISON:  11/14/2017 FINDINGS: Cardiovascular: Heart is enlarged. No substantial pericardial effusion. Status post TAVR. Mild atherosclerotic calcification is noted in the wall of the thoracic aorta. No large central pulmonary embolus. No evidence for lobar or segmental pulmonary embolus. Subsegmental pulmonary arteries to both lower  lobes are not reliably evaluated due to bolus timing and motion artifact. Mediastinum/Nodes: 3.5 cm right thyroid   nodule incompletely visualized. This has been evaluated on previous imaging. (Ref: J Am Coll Radiol. 2015 Feb;12(2): 143-50).No mediastinal lymphadenopathy. There is no hilar lymphadenopathy. The esophagus has normal imaging features. There is no axillary lymphadenopathy. Lungs/Pleura: Interlobular septal thickening noted in the upper lobes bilaterally with bilateral asymmetric central predominant ground-glass opacity. 5 mm right upper lobe nodule is stable on 43/7. 5 mm right lung nodule on 70/7 is in a region of collapse/consolidative disease. 5 mm right lung nodule on 67/7 is not substantially changed in the interval consistent with benign etiology. Tiny nodules again noted both lower lobes. Collapse/consolidative disease in the lower lungs bilaterally is associated with small bilateral pleural effusions. Upper Abdomen: Nodularity of the liver contour is compatible with cirrhosis. NG tube tip is positioned in the mid stomach. Musculoskeletal: No worrisome lytic or sclerotic osseous abnormality. Review of the MIP images confirms the above findings. IMPRESSION: 1. No large central pulmonary embolus. No evidence for lobar or segmental pulmonary embolus. Subsegmental pulmonary arteries to both lower lobes are not reliably evaluated due to bolus timing and motion artifact. 2. Interlobular septal thickening in the upper lobes bilaterally with bilateral asymmetric central predominant ground-glass opacity. Imaging features are compatible with pulmonary edema. 3. Collapse/consolidative disease in the lower lungs bilaterally is associated with small bilateral pleural effusions. Lower lobe disease compatible with atelectasis and/or pneumonia. 4. Bilateral pulmonary nodules noted, unchanged since remote prior where visible although portions of both lungs have been obscured by airspace disease. Consider follow-up  CT chest without contrast after resolution of acute symptoms for more definitive evaluation of pulmonary nodules. 5. Cirrhosis. 6.  Aortic Atherosclerosis (ICD10-I70.0). Electronically Signed   By: Camellia Candle M.D.   On: 02/11/2023 15:13   CT Head Wo Contrast Result Date: 02/11/2023 CLINICAL DATA:  Mental status change, unknown cause EXAM: CT HEAD WITHOUT CONTRAST TECHNIQUE: Contiguous axial images were obtained from the base of the skull through the vertex without intravenous contrast. RADIATION DOSE REDUCTION: This exam was performed according to the departmental dose-optimization program which includes automated exposure control, adjustment of the mA and/or kV according to patient size and/or use of iterative reconstruction technique. COMPARISON:  None Available. FINDINGS: Brain: No evidence of acute infarction, hemorrhage, hydrocephalus, extra-axial collection or mass lesion/mass effect. Vascular: No hyperdense vessel. Skull: No acute fracture. Sinuses/Orbits: Mild paranasal sinus mucosal thickening. No acute orbital findings. Other: No mastoid effusions. IMPRESSION: No evidence of acute intracranial abnormality. An MRI could provide more sensitive evaluation for hypoxic/ischemic injury if clinically warranted Electronically Signed   By: Gilmore GORMAN Molt M.D.   On: 02/11/2023 14:12   DG Abdomen 1 View Result Date: 02/11/2023 CLINICAL DATA:  Enteric catheter placement EXAM: ABDOMEN - 1 VIEW COMPARISON:  08/04/2007 FINDINGS: Supine frontal view of the abdomen and pelvis was obtained, excluding the hemidiaphragms, left flank, lower pelvis by collimation. Enteric catheter passes below diaphragm, tip and side port projecting over the gastric fundus. Bowel gas pattern is unremarkable without obstruction or ileus. There is an oval lucency projecting over the right iliac bone, of uncertain etiology but likely external to the patient. No masses or abnormal calcifications. No acute fracture. IMPRESSION: 1.  Enteric catheter tip projecting over the gastric fundus. Electronically Signed   By: Ozell Daring M.D.   On: 02/11/2023 11:25   DG Chest Portable 1 View Result Date: 02/11/2023 CLINICAL DATA:  Intubated EXAM: PORTABLE CHEST 1 VIEW COMPARISON:  12/06/2017 FINDINGS: Single frontal view of the chest demonstrates endotracheal tube overlying tracheal air column, tip approximately  2.5 cm above carina. Enteric catheter passes below diaphragm tip excluded by collimation. External defibrillator pads overlie the chest. Cardiac silhouette is enlarged. There is increased pulmonary vascular congestion without airspace disease, effusion, or pneumothorax. No acute bony abnormalities. IMPRESSION: 1. Support devices as above. 2. Pulmonary vascular congestion without overt edema. Electronically Signed   By: Ozell Daring M.D.   On: 02/11/2023 11:23    Microbiology: Results for orders placed or performed during the hospital encounter of 02/11/23  Culture, blood (routine x 2)     Status: None   Collection Time: 02/11/23 10:44 AM   Specimen: BLOOD RIGHT ARM  Result Value Ref Range Status   Specimen Description   Final    BLOOD RIGHT ARM Performed at [facility], [address] [place], [place] [address]    Special Requests   Final    BOTTLES DRAWN AEROBIC AND ANAEROBIC Blood Culture results may not be optimal due to an inadequate volume of blood received in culture bottles Performed at [facility], [address] [place], [place] [address]    Culture   Final    NO GROWTH 5 DAYS Performed at [facility], [address], [place], [place] [address]    Report Status 02/16/2023 FINAL  Final  Culture, blood (routine x 2)     Status: None   Collection Time: 02/11/23 10:46 AM   Specimen: BLOOD  Result Value Ref Range Status   Specimen Description   Final    BLOOD BLOOD LEFT FOREARM Performed at [facility], [address] [place], [place] [address]    Special  Requests   Final    BOTTLES DRAWN AEROBIC AND ANAEROBIC Blood Culture results may not be optimal due to an inadequate volume of blood received in culture bottles Performed at [facility], [address] [place], [place] [address]    Culture   Final    NO GROWTH 5 DAYS Performed at [facility], [address], [place], [place] [address]    Report Status 02/16/2023 FINAL  Final  SARS Coronavirus 2 by RT PCR (hospital order, performed in [facility] hospital lab) *cepheid single result test* Anterior Nasal Swab     Status: None   Collection Time: 02/11/23  1:37 PM   Specimen: Anterior Nasal Swab  Result Value Ref Range Status   SARS Coronavirus 2 by RT PCR NEGATIVE NEGATIVE Final    Comment: (NOTE) SARS-CoV-2 target nucleic acids are NOT DETECTED.  The SARS-CoV-2 RNA is generally detectable in upper and lower respiratory specimens during the acute phase of infection. The lowest concentration of SARS-CoV-2 viral copies this assay can detect is 250 copies / mL. A negative result does not preclude SARS-CoV-2 infection and should not be used as the sole basis for treatment or other patient management decisions.  A negative result may occur with improper specimen collection / handling, submission of specimen other than nasopharyngeal swab, presence of viral mutation(s) within the areas targeted by this assay, and inadequate number of viral copies (<250 copies / mL). A negative result must be combined with clinical observations, patient history, and epidemiological information.  Fact Sheet for Patients:   roadlaptop.co.za  Fact Sheet for Healthcare Providers: http://kim-miller.com/  This test is not yet approved or  cleared by the [place]  FDA and has been authorized for detection and/or diagnosis of SARS-CoV-2 by FDA under an Emergency Use Authorization (EUA).  This EUA will remain in effect (meaning this test  can be  used) for the duration of the COVID-19 declaration under Section 564(b)(1) of the Act, 21 U.S.C. section 360bbb-3(b)(1), unless the authorization is terminated or revoked sooner.  Performed at [facility], [address] [place], [place] [address]   Respiratory (~20 pathogens) panel by PCR     Status: None   Collection Time: 02/11/23  1:37 PM   Specimen: Anterior Nasal Swab; Respiratory  Result Value Ref Range Status   Adenovirus NOT DETECTED NOT DETECTED Final   Coronavirus 229E NOT DETECTED NOT DETECTED Final    Comment: (NOTE) The Coronavirus on the Respiratory Panel, DOES NOT test for the novel  Coronavirus (2019 nCoV)    Coronavirus HKU1 NOT DETECTED NOT DETECTED Final   Coronavirus NL63 NOT DETECTED NOT DETECTED Final   Coronavirus OC43 NOT DETECTED NOT DETECTED Final   Metapneumovirus NOT DETECTED NOT DETECTED Final   Rhinovirus / Enterovirus NOT DETECTED NOT DETECTED Final   Influenza A NOT DETECTED NOT DETECTED Final   Influenza B NOT DETECTED NOT DETECTED Final   Parainfluenza Virus 1 NOT DETECTED NOT DETECTED Final   Parainfluenza Virus 2 NOT DETECTED NOT DETECTED Final   Parainfluenza Virus 3 NOT DETECTED NOT DETECTED Final   Parainfluenza Virus 4 NOT DETECTED NOT DETECTED Final   Respiratory Syncytial Virus NOT DETECTED NOT DETECTED Final   Bordetella pertussis NOT DETECTED NOT DETECTED Final   Bordetella Parapertussis NOT DETECTED NOT DETECTED Final   Chlamydophila pneumoniae NOT DETECTED NOT DETECTED Final   Mycoplasma pneumoniae NOT DETECTED NOT DETECTED Final    Comment: Performed at [facility], [address] [place], [place] [address]  Urine Culture     Status: Abnormal   Collection Time: 02/11/23  1:37 PM   Specimen: Urine, Clean Catch  Result Value Ref Range Status   Specimen Description   Final    URINE, CLEAN CATCH Performed at [facility], [address] [place], [place] [address]    Special Requests   Final     NONE Performed at [facility], [address] [place], [place] [address]    Culture >=100,000 COLONIES/mL ESCHERICHIA COLI (A)  Final   Report Status 02/13/2023 FINAL  Final   Organism ID, Bacteria ESCHERICHIA COLI (A)  Final      Susceptibility   Escherichia coli - MIC*    AMPICILLIN <=2 SENSITIVE Sensitive     CEFAZOLIN  <=4 SENSITIVE Sensitive     CEFEPIME  <=0.12 SENSITIVE Sensitive     CEFTRIAXONE  <=0.25 SENSITIVE Sensitive     CIPROFLOXACIN  <=0.25 SENSITIVE Sensitive     GENTAMICIN <=1 SENSITIVE Sensitive     IMIPENEM <=0.25 SENSITIVE Sensitive     NITROFURANTOIN <=16 SENSITIVE Sensitive     TRIMETH/SULFA <=20 SENSITIVE Sensitive     AMPICILLIN/SULBACTAM <=2 SENSITIVE Sensitive     PIP/TAZO <=4 SENSITIVE Sensitive ug/mL    * >=100,000 COLONIES/mL ESCHERICHIA COLI  Culture, blood (Routine X 2) w Reflex to ID Panel     Status: None   Collection Time: 02/11/23  2:24 PM   Specimen: BLOOD  Result Value Ref Range Status   Specimen Description   Final    BLOOD BLOOD RIGHT ARM Performed at [facility], [address] [place], [place] [address]    Special Requests   Final    BOTTLES DRAWN AEROBIC AND ANAEROBIC Blood Culture results may not be optimal due to an inadequate volume of blood received in culture bottles Performed at [facility], [address] [place], [place] [address]    Culture   Final    NO GROWTH 5 DAYS Performed at [facility], [address], [place], [place] [address]    Report Status 02/16/2023 FINAL  Final  MRSA Next Gen by PCR, Nasal     Status: None   Collection Time: 02/11/23  4:43 PM   Specimen: Nasal Mucosa; Nasal Swab  Result Value Ref Range Status   MRSA by PCR Next Gen NOT DETECTED NOT DETECTED Final    Comment: (NOTE) The GeneXpert MRSA Assay (FDA approved for NASAL specimens only), is one component of a comprehensive MRSA colonization surveillance program. It is not intended to diagnose MRSA infection  nor to guide or monitor treatment for MRSA infections. Test performance is not FDA approved in patients less than 65 years old. Performed at [facility], [address] [place], [place] [address]   Culture, Respiratory w Gram Stain     Status: None   Collection Time: 02/12/23 11:07 AM   Specimen: Bronchoalveolar Lavage; Respiratory  Result Value Ref Range Status   Specimen Description   Final    BRONCHIAL ALVEOLAR LAVAGE Performed at [facility], [address] [place], [place] [address]    Special Requests   Final    NONE Performed at [facility], [address] [place], [place] [address]    Gram Stain RARE WBC SEEN RARE GRAM POSITIVE COCCI   Final   Culture   Final    RARE Normal respiratory flora-no Staph aureus or Pseudomonas seen Performed at [facility], [address] [place], [place] [address]    Report Status 02/15/2023 FINAL  Final    Labs: CBC: Recent Labs  Lab 02/27/23 0502  WBC 8.6  HGB 9.4*  HCT 30.5*  MCV 90.2  PLT 163   Basic Metabolic Panel: Recent Labs  Lab 02/27/23 0502  NA 138  K 4.3  CL 102  CO2 26  GLUCOSE 132*  BUN 48*  CREATININE 1.11*  CALCIUM  8.5*   Liver Function Tests: No results for input(s): AST, ALT, ALKPHOS, BILITOT, PROT, ALBUMIN in the last 168 hours. CBG: Recent Labs  Lab 02/28/23 1701 03/01/23 0833 03/01/23 1118 03/01/23 1732 03/02/23 0749  GLUCAP 144* 112* 143* 184* 124*    Discharge time spent:  36 minutes.  Signed: Drue ONEIDA Potter, MD [facility] 03/02/2023

## 2023-03-02 NOTE — Progress Notes (Signed)
 Attempted x3 to call facility. No answer.

## 2023-03-07 ENCOUNTER — Telehealth: Payer: Self-pay

## 2023-03-08 ENCOUNTER — Other Ambulatory Visit: Payer: Self-pay

## 2023-03-08 ENCOUNTER — Emergency Department: Payer: Medicare HMO

## 2023-03-08 ENCOUNTER — Inpatient Hospital Stay
Admission: EM | Admit: 2023-03-08 | Discharge: 2023-03-11 | DRG: 291 | Disposition: A | Discharge status: Home Health | Payer: Medicare HMO

## 2023-03-08 ENCOUNTER — Inpatient Hospital Stay: Payer: Medicare HMO

## 2023-03-08 DIAGNOSIS — I5033 Acute on chronic diastolic (congestive) heart failure: Secondary | ICD-10-CM | POA: Diagnosis present

## 2023-03-08 DIAGNOSIS — Z882 Allergy status to sulfonamides status: Secondary | ICD-10-CM

## 2023-03-08 DIAGNOSIS — I890 Lymphedema, not elsewhere classified: Secondary | ICD-10-CM | POA: Diagnosis present

## 2023-03-08 DIAGNOSIS — Z961 Presence of intraocular lens: Secondary | ICD-10-CM | POA: Diagnosis present

## 2023-03-08 DIAGNOSIS — I5023 Acute on chronic systolic (congestive) heart failure: Secondary | ICD-10-CM | POA: Diagnosis present

## 2023-03-08 DIAGNOSIS — Z87891 Personal history of nicotine dependence: Secondary | ICD-10-CM | POA: Diagnosis not present

## 2023-03-08 DIAGNOSIS — Z8711 Personal history of peptic ulcer disease: Secondary | ICD-10-CM

## 2023-03-08 DIAGNOSIS — I878 Other specified disorders of veins: Secondary | ICD-10-CM | POA: Diagnosis present

## 2023-03-08 DIAGNOSIS — Z953 Presence of xenogenic heart valve: Secondary | ICD-10-CM | POA: Diagnosis not present

## 2023-03-08 DIAGNOSIS — I509 Heart failure, unspecified: Principal | ICD-10-CM

## 2023-03-08 DIAGNOSIS — I130 Hypertensive heart and chronic kidney disease with heart failure and stage 1 through stage 4 chronic kidney disease, or unspecified chronic kidney disease: Secondary | ICD-10-CM | POA: Diagnosis present

## 2023-03-08 DIAGNOSIS — Z833 Family history of diabetes mellitus: Secondary | ICD-10-CM | POA: Diagnosis not present

## 2023-03-08 DIAGNOSIS — Z880 Allergy status to penicillin: Secondary | ICD-10-CM | POA: Diagnosis not present

## 2023-03-08 DIAGNOSIS — M797 Fibromyalgia: Secondary | ICD-10-CM | POA: Diagnosis present

## 2023-03-08 DIAGNOSIS — Z7984 Long term (current) use of oral hypoglycemic drugs: Secondary | ICD-10-CM

## 2023-03-08 DIAGNOSIS — Z79899 Other long term (current) drug therapy: Secondary | ICD-10-CM

## 2023-03-08 DIAGNOSIS — Z89421 Acquired absence of other right toe(s): Secondary | ICD-10-CM | POA: Diagnosis not present

## 2023-03-08 DIAGNOSIS — Z8674 Personal history of sudden cardiac arrest: Secondary | ICD-10-CM

## 2023-03-08 DIAGNOSIS — Z801 Family history of malignant neoplasm of trachea, bronchus and lung: Secondary | ICD-10-CM | POA: Diagnosis not present

## 2023-03-08 DIAGNOSIS — Z885 Allergy status to narcotic agent status: Secondary | ICD-10-CM | POA: Diagnosis not present

## 2023-03-08 DIAGNOSIS — E785 Hyperlipidemia, unspecified: Secondary | ICD-10-CM | POA: Diagnosis present

## 2023-03-08 DIAGNOSIS — I252 Old myocardial infarction: Secondary | ICD-10-CM | POA: Diagnosis not present

## 2023-03-08 DIAGNOSIS — E1122 Type 2 diabetes mellitus with diabetic chronic kidney disease: Secondary | ICD-10-CM | POA: Diagnosis present

## 2023-03-08 DIAGNOSIS — Z91148 Patient's other noncompliance with medication regimen for other reason: Secondary | ICD-10-CM

## 2023-03-08 DIAGNOSIS — Z8249 Family history of ischemic heart disease and other diseases of the circulatory system: Secondary | ICD-10-CM

## 2023-03-08 DIAGNOSIS — L0390 Cellulitis, unspecified: Secondary | ICD-10-CM

## 2023-03-08 DIAGNOSIS — Z888 Allergy status to other drugs, medicaments and biological substances status: Secondary | ICD-10-CM

## 2023-03-08 DIAGNOSIS — N182 Chronic kidney disease, stage 2 (mild): Secondary | ICD-10-CM | POA: Diagnosis present

## 2023-03-08 DIAGNOSIS — K760 Fatty (change of) liver, not elsewhere classified: Secondary | ICD-10-CM | POA: Diagnosis present

## 2023-03-08 DIAGNOSIS — I428 Other cardiomyopathies: Secondary | ICD-10-CM | POA: Diagnosis present

## 2023-03-08 DIAGNOSIS — R5381 Other malaise: Secondary | ICD-10-CM | POA: Diagnosis present

## 2023-03-08 DIAGNOSIS — Z90710 Acquired absence of both cervix and uterus: Secondary | ICD-10-CM | POA: Diagnosis not present

## 2023-03-08 DIAGNOSIS — R0602 Shortness of breath: Secondary | ICD-10-CM | POA: Diagnosis present

## 2023-03-08 LAB — CBC
HCT: 33 % — ABNORMAL LOW (ref 36.0–46.0)
Hemoglobin: 10.1 g/dL — ABNORMAL LOW (ref 12.0–15.0)
MCH: 27.9 pg (ref 26.0–34.0)
MCHC: 30.6 g/dL (ref 30.0–36.0)
MCV: 91.2 fL (ref 80.0–100.0)
Platelets: 167 10*3/uL (ref 150–400)
RBC: 3.62 MIL/uL — ABNORMAL LOW (ref 3.87–5.11)
RDW: 15.6 % — ABNORMAL HIGH (ref 11.5–15.5)
WBC: 7.5 10*3/uL (ref 4.0–10.5)
nRBC: 0 % (ref 0.0–0.2)

## 2023-03-08 LAB — BASIC METABOLIC PANEL
Anion gap: 10 (ref 5–15)
BUN: 24 mg/dL — ABNORMAL HIGH (ref 8–23)
CO2: 27 mmol/L (ref 22–32)
Calcium: 8.6 mg/dL — ABNORMAL LOW (ref 8.9–10.3)
Chloride: 106 mmol/L (ref 98–111)
Creatinine, Ser: 1.3 mg/dL — ABNORMAL HIGH (ref 0.44–1.00)
GFR, Estimated: 42 mL/min — ABNORMAL LOW (ref >60)
Glucose, Bld: 127 mg/dL — ABNORMAL HIGH (ref 70–99)
Potassium: 4 mmol/L (ref 3.5–5.1)
Sodium: 143 mmol/L (ref 135–145)

## 2023-03-08 LAB — CBG MONITORING, ED: Glucose-Capillary: 102 mg/dL — ABNORMAL HIGH (ref 70–99)

## 2023-03-08 LAB — TROPONIN I (HIGH SENSITIVITY)
Troponin I (High Sensitivity): 55 ng/L — ABNORMAL HIGH (ref <18)
Troponin I (High Sensitivity): 50 ng/L — ABNORMAL HIGH (ref <18)

## 2023-03-08 LAB — PROTIME-INR
INR: 1.2 (ref 0.8–1.2)
Prothrombin Time: 15.1 s (ref 11.4–15.2)

## 2023-03-08 LAB — LACTIC ACID, PLASMA: Lactic Acid, Venous: 1.2 mmol/L (ref 0.5–1.9)

## 2023-03-08 LAB — BRAIN NATRIURETIC PEPTIDE: B Natriuretic Peptide: 867.3 pg/mL — ABNORMAL HIGH (ref 0.0–100.0)

## 2023-03-08 MED ORDER — FUROSEMIDE 10 MG/ML IJ SOLN
80.0000 mg | Freq: Once | INTRAMUSCULAR | Status: AC
  Administered 2023-03-08: 80 mg via INTRAVENOUS
  Filled 2023-03-08: qty 8

## 2023-03-08 MED ORDER — ACETAMINOPHEN 325 MG PO TABS
650.0000 mg | ORAL_TABLET | Freq: Four times a day (QID) | ORAL | Status: DC | PRN
  Administered 2023-03-09 – 2023-03-10 (×2): 650 mg via ORAL
  Filled 2023-03-08 (×2): qty 2

## 2023-03-08 MED ORDER — SODIUM CHLORIDE 0.9 % IV SOLN: 250.0000 mL | INTRAVENOUS | Status: AC | PRN

## 2023-03-08 MED ORDER — CARVEDILOL 3.125 MG PO TABS
3.1250 mg | ORAL_TABLET | Freq: Two times a day (BID) | ORAL | Status: DC
  Administered 2023-03-09 – 2023-03-11 (×5): 3.125 mg via ORAL
  Filled 2023-03-08 (×5): qty 1

## 2023-03-08 MED ORDER — LORATADINE 10 MG PO TABS: 10.0000 mg | ORAL_TABLET | Freq: Every day | ORAL | Status: DC | PRN

## 2023-03-08 MED ORDER — ATORVASTATIN CALCIUM 20 MG PO TABS
40.0000 mg | ORAL_TABLET | Freq: Every day | ORAL | Status: DC
Start: 2023-03-09 — End: 2023-03-11
  Administered 2023-03-09 – 2023-03-11 (×3): 40 mg via ORAL
  Filled 2023-03-08 (×3): qty 2

## 2023-03-08 MED ORDER — POTASSIUM CHLORIDE CRYS ER 20 MEQ PO TBCR
20.0000 meq | EXTENDED_RELEASE_TABLET | Freq: Every day | ORAL | Status: DC
  Administered 2023-03-09 – 2023-03-11 (×3): 20 meq via ORAL
  Filled 2023-03-08 (×3): qty 1

## 2023-03-08 MED ORDER — HEPARIN SODIUM (PORCINE) 5000 UNIT/ML IJ SOLN
5000.0000 [IU] | Freq: Two times a day (BID) | INTRAMUSCULAR | Status: DC
  Administered 2023-03-08 – 2023-03-11 (×6): 5000 [IU] via SUBCUTANEOUS
  Filled 2023-03-08 (×6): qty 1

## 2023-03-08 MED ORDER — FUROSEMIDE 10 MG/ML IJ SOLN
80.0000 mg | Freq: Two times a day (BID) | INTRAMUSCULAR | Status: DC
  Administered 2023-03-09 (×2): 80 mg via INTRAVENOUS
  Filled 2023-03-08 (×3): qty 8

## 2023-03-08 MED ORDER — ENALAPRIL MALEATE 10 MG PO TABS
10.0000 mg | ORAL_TABLET | Freq: Every day | ORAL | Status: DC
Start: 2023-03-09 — End: 2023-03-11
  Administered 2023-03-09 – 2023-03-11 (×2): 10 mg via ORAL
  Filled 2023-03-08 (×3): qty 1

## 2023-03-08 MED ORDER — TRAMADOL HCL 50 MG PO TABS
50.0000 mg | ORAL_TABLET | Freq: Two times a day (BID) | ORAL | Status: DC | PRN
  Administered 2023-03-08 – 2023-03-10 (×4): 50 mg via ORAL
  Filled 2023-03-08 (×4): qty 1

## 2023-03-08 MED ORDER — ONDANSETRON HCL 4 MG/2ML IJ SOLN: 4.0000 mg | Freq: Four times a day (QID) | INTRAMUSCULAR | Status: DC | PRN

## 2023-03-08 MED ORDER — INSULIN ASPART 100 UNIT/ML IJ SOLN
0.0000 [IU] | Freq: Three times a day (TID) | INTRAMUSCULAR | Status: DC
  Administered 2023-03-09 – 2023-03-10 (×3): 1 [IU] via SUBCUTANEOUS
  Administered 2023-03-11: 2 [IU] via SUBCUTANEOUS
  Filled 2023-03-08 (×4): qty 1

## 2023-03-08 MED ORDER — CEFTRIAXONE SODIUM 1 G IJ SOLR
1.0000 g | Freq: Once | INTRAMUSCULAR | Status: AC
  Administered 2023-03-08: 1 g via INTRAVENOUS
  Filled 2023-03-08: qty 10

## 2023-03-08 MED ORDER — SODIUM CHLORIDE 0.9% FLUSH
3.0000 mL | Freq: Two times a day (BID) | INTRAVENOUS | Status: DC
  Administered 2023-03-08 – 2023-03-11 (×7): 3 mL via INTRAVENOUS

## 2023-03-08 MED ORDER — SODIUM CHLORIDE 0.9% FLUSH: 3.0000 mL | INTRAVENOUS | Status: DC | PRN

## 2023-03-08 NOTE — ED Triage Notes (Signed)
 Pt here with SOB since this morning. Pt states she was here recently for same. Pt states her heart has being having some pains as well. 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 Pt denies NVD.

## 2023-03-08 NOTE — ED Provider Notes (Signed)
 [facility] Provider Note    Event Date/Time   First MD Initiated Contact with Patient 03/08/23 1335     (approximate)   History   Shortness of Breath   HPI  [name] is a 83 y.o. female with history of recent cardiac arrest who comes in with shortness of breath.  I reviewed the hospital admission from 02/13/2023 where patient had a cardiac arrest thought to be secondary to CHF.  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  She has had a history of a TAVR.  It appears that patient had an echocardiogram that shows an EF of 30 to 35%.  She was discharged on lasix  40 bid.  According to patient she did not fill any Lasix  after discharge that she had 80 mg pills and that she takes 80 mg of Lasix .  She states that she took 2 of them this morning.  She states that sometimes she takes them twice a day.  She reports developing more shortness of breath and some weakness as well as a little bit of chest pain.  She reports that given her history of what happened last month she feels a little trauma in regards to and was worried about her shortness of breath.     Physical Exam   Triage Vital Signs: ED Triage Vitals  Encounter Vitals Group     BP 03/08/23 0944 (!) 130/55     Systolic BP Percentile --      Diastolic BP Percentile --      Pulse Rate 03/08/23 0944 65     Resp 03/08/23 0944 17     Temp 03/08/23 0949 97.8 F (36.6 C)     Temp src --      SpO2 03/08/23 0944 100 %     Weight 03/08/23 0944 282 lb 13.6 oz (128.3 kg)     Height 03/08/23 0944 5' 2.01 (1.575 m)     Head Circumference --      Peak Flow --      Pain Score 03/08/23 0944 8     Pain Loc --      Pain Education --      Exclude from Growth Chart --     Most recent vital signs: Vitals:   03/08/23 0944 03/08/23 0949  BP: (!) 130/55   Pulse: 65   Resp: 17   Temp:  97.8 F (36.6 C)  SpO2: 100%      General: Awake, no distress.   CV:  Good peripheral perfusion.  Resp:  Normal effort.  Clear lungs Abd:  No distention.  Other:  Edema noted feet are warm and well-perfused.  However the left leg does feel warm and is red.   ED Results / Procedures / Treatments   Labs (all labs ordered are listed, but only abnormal results are displayed) Labs Reviewed  BASIC METABOLIC PANEL - Abnormal; Notable for the following components:      Result Value   Glucose, Bld 127 (*)    BUN 24 (*)    Creatinine, Ser 1.30 (*)    Calcium  8.6 (*)    GFR, Estimated 42 (*)    All other components within normal limits  CBC - Abnormal; Notable for the following components:   RBC 3.62 (*)    Hemoglobin 10.1 (*)    HCT 33.0 (*)    RDW 15.6 (*)    All other components within normal limits  BRAIN  NATRIURETIC PEPTIDE - Abnormal; Notable for the following components:   B Natriuretic Peptide 867.3 (*)    All other components within normal limits  TROPONIN I (HIGH SENSITIVITY) - Abnormal; Notable for the following components:   Troponin I (High Sensitivity) 55 (*)    All other components within normal limits  TROPONIN I (HIGH SENSITIVITY) - Abnormal; Notable for the following components:   Troponin I (High Sensitivity) 50 (*)    All other components within normal limits  PROTIME-INR     EKG  My interpretation of EKG:  Sinus rate of 65 without any ST elevation or T wave inversion with left bundle branch block  RADIOLOGY I have reviewed the xray personally and interpreted and positive edema   PROCEDURES:  Critical Care performed: No  Procedures   MEDICATIONS ORDERED IN ED: Medications  furosemide  (LASIX ) injection 80 mg (has no administration in time range)  cefTRIAXone  (ROCEPHIN ) 1 g in sodium chloride  0.9 % 100 mL IVPB (has no administration in time range)     IMPRESSION / MDM / ASSESSMENT AND PLAN / ED COURSE  I reviewed the triage vital signs and the nursing notes.   Patient's presentation is most consistent with  severe exacerbation of chronic illness.   Patient comes in with shortness of breath, chest pain.  She had a catheterization done a month ago without any evidence of ischemic cause of her heart failure.  I doubt this is ACS.  However she reports taking her Lasix  sometimes just once daily instead of twice daily and doubling up in the morning.  We discussed how Lasix  worked and to take 80 mg once in the morning and once around 2 or 3 -6 hours after the first dose for adequate dosing.  She looks pretty good at rest but we will ambulate patient given her history of cardiac arrest from fluid overload it may be reasonable to monitor overnight in the hospital given patient's age, lives by herself versus outpatient follow-up if she ambulates well.  Will give a dose of IV Lasix .  At this time I lower suspicion for other acute pathology such as PE given she had a CT scan done on 12/20 that was negative.  I suspect patient also has some cellulitis does not meet sepsis but will get blood cultures, lactate given dose of Lasix .  Patient attempted to ambulate and oxygen levels look okay but does feel short of breath lives by herself we discussed admission versus going home but given history of cardiac arrest from fluid overload I think it would be safer to admit patient for IV diuresis and patient is agreeable.   FINAL CLINICAL IMPRESSION(S) / ED DIAGNOSES   Final diagnoses:  Acute on chronic congestive heart failure, unspecified heart failure type (HCC)  Cellulitis, unspecified cellulitis site     Rx / DC Orders   ED Discharge Orders     None        Note:  This document was prepared using Dragon voice recognition software and may include unintentional dictation errors.   Ernest Ronal BRAVO, MD 03/08/23 [phone]

## 2023-03-08 NOTE — ED Notes (Signed)
 This RN attempted IV access x2. This RN asked another staff member to attempt IV access at this time.

## 2023-03-08 NOTE — Progress Notes (Signed)
 Heart Failure Navigator Progress Note  Assessed for Heart & Vascular TOC clinic readiness.  Patient does not meet criteria due to TAVR patient of Dr. Wonda.   Navigator will sign off at this time.  Charmaine Pines, RN, BSN [facility] Heart Failure Navigator Secure Chat Only

## 2023-03-08 NOTE — ED Provider Triage Note (Signed)
 Emergency Medicine Provider Triage Evaluation Note  [name] , a 83 y.o. female  was evaluated in triage.  Pt complains of Chest pain and shortness of breath.  Hx of MI and stent less than a month ago.    Review of Systems  Positive: SOB, CP Negative: No URI sx, fever, chills.   Physical Exam  BP (!) 130/55   Pulse 65   Temp 97.8 F (36.6 C)   Resp 17   Ht 5' 2.01 (1.575 m)   Wt 128.3 kg   SpO2 100%   BMI 51.72 kg/m  Gen:   Awake, no distress   Resp:  Normal effort   Lungs clear MSK:   Moves extremities without difficulty  Other:    Medical Decision Making  Medically screening exam initiated at 9:50 AM.  Appropriate orders placed.  [name] was informed that the remainder of the evaluation will be completed by another provider, this initial triage assessment does not replace that evaluation, and the importance of remaining in the ED until their evaluation is complete.     Saunders Shona CROME, PA-C 03/08/23 [phone]

## 2023-03-08 NOTE — H&P (Signed)
 History and Physical    [name] FMW:[id number] DOB: 1941-01-29 DOA: 03/08/2023  PCP: Loring Tanda Mae, MD (Confirm with patient/family/NH records and if not entered, this has to be entered at [facility] point of entry) Patient coming from: Home  I have personally briefly reviewed patient's old medical records in [facility]  Chief Complaint: SOB and leg swelling  HPI: [name] is a 83 y.o. 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  Patient was recently hospitalized for cardiopulmonary arrest septic shock, who was just released from hospital 7 days ago.  Patient reported that last night she woke up with increasing shortness of breath and a dry cough but denied any chest pains no fever or chills.  When questioned regarding whether she has been taking her medications, patient says that she might not been taking Lasix  the right way, instead of 40 mg twice daily she has been taking Lasix  80 mg daily in the morning, she does report good diuresis effect in the mornings after the morning dose of Lasix .  Despite, she has noticed gradually increasing of bilateral lower extremity swelling.  Son over the phone reported that the patient  very forgetful and son is not confident patient has been taking her medication in the correct way.  ED Course: Afebrile, nontachycardic blood pressure 130/50, O2 saturation 99% on room air.  Checks x-ray shows cardiomegaly and pulmonary vasculature congestion.  Blood work showed troponin 55> 50, hemoglobin 10.1, creatinine 1.3, BUN 24, bicarb 27, K4.0.  Patient was given 80 mg Lasix  IV x 1 in the ED and 1 dose of ceftriaxone  to treat possible lower extremity cellulitis.  Review of Systems: As per HPI otherwise 14 point review of systems negative.     Past Medical History:  Diagnosis Date   Abnormal thyroid  biopsy 2018   results were negative.   Anemia    when I was alot younger (12/06/2017)   Anxiety    self reported   Arthritis    almost all over; used to cry w/it when I was in my teens (12/06/2017)   CHF (congestive heart failure) (HCC)    Chronic back pain    all over (12/06/2017)   Depression    lost my son last year to cancer; I tended to him; he lived w/me (12/06/2017)   Diverticulosis of colon (without mention of hemorrhage)    External hemorrhoids without mention of complication    Fatty liver disease, nonalcoholic    Fibromyalgia    History of kidney stones    History of stomach ulcers 1970   Hyperlipemia    Hypertension    IBS (irritable bowel syndrome)    Morbid obesity (HCC)    Psoriasis    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   26 mm Medtronic Evolut Pro transcatheter heart valve placed via percutaneous right transfemoral approach    Severe aortic stenosis    Sinus headache    Sleep apnea    was told I do; never have had any problems w/it (12/06/2017)   Type II diabetes mellitus (HCC)     Past Surgical History:  Procedure Laterality Date   ABDOMINAL ADHESION SURGERY     took 2 out   ABDOMINAL HYSTERECTOMY     AMPUTATION Left 01/23/2022   Procedure: AMPUTATION DIGIT;  Surgeon: Tobie,  Franky SQUIBB, DPM;  Location: [facility] ORS;  Service: Podiatry;  Laterality: Left;   AMPUTATION TOE Right 02/12/2020   Procedure: Right hallux and second toe amputation;  Surgeon: Kit Rush, MD;  Location: [facility];  Service: Orthopedics;  Laterality: Right;   APPENDECTOMY     BACK SURGERY     BIOPSY  08/05/2020   Procedure: BIOPSY;  Surgeon: Dianna Specking, MD;  Location: [facility];  Service: Endoscopy;;   BIOPSY THYROID   2018   results were negative.   BREAST LUMPECTOMY Left X 1   benign   CATARACT EXTRACTION W/ INTRAOCULAR LENS  IMPLANT, BILATERAL Bilateral     CATARACT EXTRACTION W/ INTRAOCULAR LENS IMPLANT Bilateral 2017   COLONOSCOPY WITH PROPOFOL  N/A 08/05/2020   Procedure: COLONOSCOPY WITH PROPOFOL ;  Surgeon: Dianna Specking, MD;  Location: [facility];  Service: Endoscopy;  Laterality: N/A;   ESOPHAGOGASTRODUODENOSCOPY (EGD) WITH PROPOFOL  N/A 08/05/2020   Procedure: ESOPHAGOGASTRODUODENOSCOPY (EGD) WITH PROPOFOL ;  Surgeon: Dianna Specking, MD;  Location: [facility];  Service: Endoscopy;  Laterality: N/A;   EYE SURGERY     HEMORRHOID BANDING     LAPAROSCOPIC CHOLECYSTECTOMY     LEFT AND RIGHT HEART CATHETERIZATION WITH CORONARY ANGIOGRAM N/A 11/15/2013   Procedure: LEFT AND RIGHT HEART CATHETERIZATION WITH CORONARY ANGIOGRAM;  Surgeon: Ozell JONETTA Fell, MD;  Location: [facility] C[facility];  Service: Cardiovascular;  Laterality: N/A;   LEFT HEART CATH AND CORONARY ANGIOGRAPHY N/A 02/17/2023   Procedure: LEFT HEART CATH AND CORONARY ANGIOGRAPHY;  Surgeon: Darron Deatrice LABOR, MD;  Location: [facility] CV LAB;  Service: Cardiovascular;  Laterality: N/A;   LUMBAR DISC SURGERY     POLYPECTOMY  08/05/2020   Procedure: POLYPECTOMY;  Surgeon: Dianna Specking, MD;  Location: [facility];  Service: Endoscopy;;   RETINAL DETACHMENT SURGERY Right    RIGHT/LEFT HEART CATH AND CORONARY ANGIOGRAPHY N/A 11/02/2017   Procedure: RIGHT/LEFT HEART CATH AND CORONARY ANGIOGRAPHY;  Surgeon: Verlin Lonni JONETTA, MD;  Location: [facility] CV [facility];  Service: Cardiovascular;  Laterality: N/A;   TEE WITHOUT CARDIOVERSION N/A 12/06/2017   Procedure: TRANSESOPHAGEAL ECHOCARDIOGRAM (TEE);  Surgeon: Fell Ozell, MD;  Location: [facility] OR;  Service: Open Heart Surgery;  Laterality: N/A;   TONSILLECTOMY     TRANSCATHETER AORTIC VALVE REPLACEMENT, TRANSFEMORAL  12/06/2017   TRANSCATHETER AORTIC VALVE REPLACEMENT, TRANSFEMORAL N/A 12/06/2017   Procedure: TRANSCATHETER AORTIC VALVE REPLACEMENT, TRANSFEMORAL;  Surgeon: Fell Ozell, MD;  Location: [facility] OR;  Service: Open Heart  Surgery;  Laterality: N/A;   TRANSESOPHAGEAL ECHOCARDIOGRAM  12/06/2017     reports that she quit smoking about 23 years ago. Her smoking use included cigarettes. She started smoking about 55 years ago. She has a 56 pack-year smoking history. She has never used smokeless tobacco. She reports that she does not drink alcohol and does not use drugs.  Allergies  Allergen Reactions   Castor Oil     passed out ? SYNCOPE ?   Sulfonamide Derivatives     Other Reaction(s): Not available   Amoxicillin  Diarrhea and Other (See Comments)    Has patient had a PCN reaction causing immediate rash, facial/tongue/throat swelling, SOB or lightheadedness with hypotension: No Has patient had a PCN reaction causing severe rash involving mucus membranes or skin necrosis: No Has patient had a PCN reaction that required hospitalization: No Has patient had a PCN reaction occurring within the last 10 years: No If all of the above answers are NO, then may proceed with Cephalosporin use.    Demerol [Meperidine] Nausea And  Vomiting   Hydrocodone Nausea And Vomiting    Family History  Problem Relation Age of Onset   Lung cancer Mother    Diabetes Maternal Grandmother    Heart disease Maternal Grandfather    Colon cancer Neg Hx      Prior to Admission medications   Medication Sig Start Date End Date Taking? Authorizing Provider  acetaminophen  (TYLENOL ) 325 MG tablet Take 2 tablets (650 mg total) by mouth every 6 (six) hours as needed for mild pain, fever or headache. 01/25/22   Fausto Sor A, DO  atorvastatin  (LIPITOR ) 40 MG tablet Take 1 tablet by mouth daily. 10/22/22   [provider]  carvedilol  (COREG ) 3.125 MG tablet Take 1 tablet (3.125 mg total) by mouth 2 (two) times daily with a meal. 02/24/23 02/24/24  Von Bellis, MD  Cholecalciferol  (VITAMIN D -3) 5000 units TABS Take 5,000 Units by mouth daily.    [provider]  Cranberry 250 MG TABS Take 250 mg by mouth daily.     [provider]  empagliflozin (JARDIANCE) 10 MG TABS tablet Take 1 tablet by mouth daily. 10/22/22   [provider]  enalapril  (VASOTEC ) 10 MG tablet Take 10 mg by mouth daily.    [provider]  furosemide  (LASIX ) 40 MG tablet Take 1 tablet (40 mg total) by mouth 2 (two) times daily. 02/24/23 02/19/24  Von Bellis, MD  loratadine  (CLARITIN ) 10 MG tablet Take 10 mg by mouth daily as needed. 08/14/20   [provider]  metFORMIN (GLUCOPHAGE) 1000 MG tablet Take 1,000 mg by mouth 2 (two) times daily. 05/05/20   [provider]  potassium chloride  SA (KLOR-CON  M) 20 MEQ tablet Take 1 tablet (20 mEq total) by mouth daily. 02/24/23 02/19/24  Von Bellis, MD  traMADol  (ULTRAM ) 50 MG tablet Take 1 tablet (50 mg total) by mouth every 8 (eight) hours as needed for moderate pain (pain score 4-6) or severe pain (pain score 7-10). 02/24/23   Von Bellis, MD    Physical Exam: Vitals:   03/08/23 0944 03/08/23 0949 03/08/23 1404  BP: (!) 130/55  131/66  Pulse: 65  73  Resp: 17  20  Temp:  97.8 F (36.6 C) 97.6 F (36.4 C)  TempSrc:   Oral  SpO2: 100%  99%  Weight: 128.3 kg    Height: 5' 2.01 (1.575 m)      Constitutional: NAD, calm, comfortable Vitals:   03/08/23 0944 03/08/23 0949 03/08/23 1404  BP: (!) 130/55  131/66  Pulse: 65  73  Resp: 17  20  Temp:  97.8 F (36.6 C) 97.6 F (36.4 C)  TempSrc:   Oral  SpO2: 100%  99%  Weight: 128.3 kg    Height: 5' 2.01 (1.575 m)     Eyes: PERRL, lids and conjunctivae normal ENMT: Mucous membranes are moist. Posterior pharynx clear of any exudate or lesions.Normal dentition.  Neck: normal, supple, no masses, no thyromegaly Respiratory: clear to auscultation bilaterally, no wheezing, fine crackles on bilateral lower fields, increasing respiratory effort. No accessory muscle use.  Cardiovascular: Regular rate and rhythm, no murmurs / rubs / gallops. 2+ extremity edema to level of mid thighs. 2+ pedal  pulses. No carotid bruits.  Abdomen: no tenderness, no masses palpated. No hepatosplenomegaly. Bowel sounds positive.  Musculoskeletal: no clubbing / cyanosis. No joint deformity upper and lower extremities. Good ROM, no contractures. Normal muscle tone.  Skin: no rashes, lesions, ulcers. No induration Neurologic: CN 2-12 grossly intact. Sensation intact, DTR normal.  Strength 5/5 in all 4.  Psychiatric: Normal judgment and insight. Alert and oriented x 3. Normal mood.     Labs on Admission: I have personally reviewed following labs and imaging studies  CBC: Recent Labs  Lab 03/08/23 1004  WBC 7.5  HGB 10.1*  HCT 33.0*  MCV 91.2  PLT 167   Basic Metabolic Panel: Recent Labs  Lab 03/08/23 1004  NA 143  K 4.0  CL 106  CO2 27  GLUCOSE 127*  BUN 24*  CREATININE 1.30*  CALCIUM  8.6*   GFR: Estimated Creatinine Clearance: 44.4 mL/min (A) (by C-G formula based on SCr of 1.3 mg/dL (H)). Liver Function Tests: No results for input(s): AST, ALT, ALKPHOS, BILITOT, PROT, ALBUMIN in the last 168 hours. No results for input(s): LIPASE, AMYLASE in the last 168 hours. No results for input(s): AMMONIA in the last 168 hours. Coagulation Profile: Recent Labs  Lab 03/08/23 1004  INR 1.2   Cardiac Enzymes: No results for input(s): CKTOTAL, CKMB, CKMBINDEX, TROPONINI in the last 168 hours. BNP (last 3 results) No results for input(s): PROBNP in the last 8760 hours. HbA1C: No results for input(s): HGBA1C in the last 72 hours. CBG: Recent Labs  Lab 03/01/23 1732 03/02/23 0749 03/02/23 1104  GLUCAP 184* 124* 147*   Lipid Profile: No results for input(s): CHOL, HDL, LDLCALC, TRIG, CHOLHDL, LDLDIRECT in the last 72 hours. Thyroid  Function Tests: No results for input(s): TSH, T4TOTAL, FREET4, T3FREE, THYROIDAB in the last 72 hours. Anemia Panel: No results for input(s): VITAMINB12, FOLATE, FERRITIN, TIBC, IRON,  RETICCTPCT in the last 72 hours. Urine analysis:    Component Value Date/Time   COLORURINE AMBER (A) 02/11/2023 1337   APPEARANCEUR CLOUDY (A) 02/11/2023 1337   LABSPEC 1.026 02/11/2023 1337   PHURINE 5.0 02/11/2023 1337   GLUCOSEU 50 (A) 02/11/2023 1337   GLUCOSEU NEGATIVE 09/04/2007 1246   HGBUR NEGATIVE 02/11/2023 1337   BILIRUBINUR NEGATIVE 02/11/2023 1337   KETONESUR NEGATIVE 02/11/2023 1337   PROTEINUR >=300 (A) 02/11/2023 1337   UROBILINOGEN 0.2 08/06/2011 1023   NITRITE NEGATIVE 02/11/2023 1337   LEUKOCYTESUR MODERATE (A) 02/11/2023 1337    Radiological Exams on Admission: DG Chest 2 View Result Date: 03/08/2023 CLINICAL DATA:  Chest pain, shortness of breath EXAM: CHEST - 2 VIEW COMPARISON:  02/11/2023 FINDINGS: Cardiomegaly. Mediastinal contours within normal limits. Mild vascular congestion. No confluent opacities, effusions or overt edema. Prior TAVR. No acute bony abnormality. IMPRESSION: Cardiomegaly, vascular congestion. Electronically Signed   By: Franky Crease M.D.   On: 03/08/2023 10:36    EKG: Independently reviewed.  Sinus, chronic LBBB  Assessment/Plan Principal Problem:   CHF (congestive heart failure) (HCC) Active Problems:   Acute on chronic heart failure with preserved ejection fraction (HFpEF) (HCC)  (please populate well all problems here in Problem List. (For example, if patient is on BP meds at home and you resume or decide to hold them, it is a problem that needs to be her. Same for CAD, COPD, HLD and so on)  Acute on chronic HFrEF decompensation -Clinically suspect patient has not been coherent with her home Lasix  regimen -Continue IV Lasix  80 mg twice daily -Echocardiogram and LHC were done on last admission, no indication for further cardiology workup -Consult case management for home care/visiting nurse services  Chronic lymphedema -Treated with antibiotics sensitive admission for septic shock.  Clinically appears to be mainly venous stasis  skin changes.  Hold off further antibiotic treatment  Nonischemic cardiomyopathy Recent cardiac pulmonary arrest -EKG showed  prolonged QRS, outpatient cardiology follow-up for CRT and AICD evaluation  CKD stage II -Fluid overload, creatinine level stable, on IV Lasix   HTN -BP stable, continue Coreg  and enalapril   IIDM -On high-dose of Lasix , hold off metformin and Jardiance -Start SSI   DVT prophylaxis: Heparin  subcu Code Status: Full code Family Communication: Son over the phone Disposition Plan: Patient is sick with CHF decompensation fluid overload with significant complicated past and recent history of CHF, CKD, cardiopulmonary arrest, requiring close monitoring while on IV diuresis, expect more than 2 midnight hospital stay Consults called: None Admission status: Tele admit   Cort ONEIDA Mana MD [facility] Pager [phone]  03/08/2023, 3:27 PM

## 2023-03-09 DIAGNOSIS — I5023 Acute on chronic systolic (congestive) heart failure: Secondary | ICD-10-CM | POA: Diagnosis not present

## 2023-03-09 LAB — BASIC METABOLIC PANEL
Anion gap: 10 (ref 5–15)
BUN: 24 mg/dL — ABNORMAL HIGH (ref 8–23)
CO2: 27 mmol/L (ref 22–32)
Calcium: 8.1 mg/dL — ABNORMAL LOW (ref 8.9–10.3)
Chloride: 103 mmol/L (ref 98–111)
Creatinine, Ser: 1.23 mg/dL — ABNORMAL HIGH (ref 0.44–1.00)
GFR, Estimated: 44 mL/min — ABNORMAL LOW (ref >60)
Glucose, Bld: 90 mg/dL (ref 70–99)
Potassium: 3.5 mmol/L (ref 3.5–5.1)
Sodium: 140 mmol/L (ref 135–145)

## 2023-03-09 LAB — CBG MONITORING, ED
Glucose-Capillary: 110 mg/dL — ABNORMAL HIGH (ref 70–99)
Glucose-Capillary: 105 mg/dL — ABNORMAL HIGH (ref 70–99)
Glucose-Capillary: 129 mg/dL — ABNORMAL HIGH (ref 70–99)

## 2023-03-09 NOTE — Evaluation (Signed)
 Occupational Therapy Evaluation Patient Details Name: [name] MRN: [id number] DOB: 1940/04/07 Today's Date: 03/09/2023   History of Present Illness 83 y.o. female who presented with shortness of breath and increasing bilateral lower extremity edema. 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   Clinical Impression   Pt was seen for PT/OT co-evaluation this date. Prior to hospital admission, pt was living at home with her son and reports IND/MOD I with ADLs, simple IADLs and mobility using rollator/RW intermittently. Pt with difficulty managing meds.   Pt presents to acute OT demonstrating impaired ADL performance and functional mobility 2/2 decreased activity tolerance and weakness (See OT problem list for additional functional deficits). Pt currently requires SUP for bed mobility and STS from EOB to RW. CGA for mobility from room to hallway and back using RW. Sp02 remained WFL throughout mobility trial. Pt noted to be soiled with brief removed and incontinence pads changed out with purewick replaced. Pt able to don socks seated at EOB with SUP. Edu on use of her sock aide at home to maximize ease/prevent overexertion. She verbalized understanding.  Pt would benefit from skilled OT services to address noted impairments and functional limitations (see below for any additional details) in order to maximize safety and independence while minimizing falls risk and caregiver burden. Do anticipate the need for follow up OT services upon acute hospital DC.         If plan is discharge home, recommend the following: Assistance with cooking/housework;Assist for transportation;Help with stairs or ramp for entrance;Direct supervision/assist for medications management;Supervision due to cognitive status;A little help with walking and/or transfers;A lot of help with  bathing/dressing/bathroom    Functional Status Assessment  Patient has had a recent decline in their functional status and demonstrates the ability to make significant improvements in function in a reasonable and predictable amount of time.  Equipment Recommendations  Tub/shower seat    Recommendations for Other Services       Precautions / Restrictions Precautions Precautions: Fall Restrictions Weight Bearing Restrictions Per Provider Order: No      Mobility Bed Mobility Overal bed mobility: Needs Assistance Bed Mobility: Supine to Sit, Sit to Supine     Supine to sit: Supervision Sit to supine: Supervision   General bed mobility comments: SUP for bed mobility tasks with no physical assist    Transfers Overall transfer level: Needs assistance Equipment used: Rolling walker (2 wheels) Transfers: Sit to/from Stand Sit to Stand: Supervision           General transfer comment: SUP for STS from EOB to RW, light CGA for mobility into the hallway and back; brief soiled so removed and replaced purewick and incontinence pads      Balance Overall balance assessment: Needs assistance Sitting-balance support: Feet supported Sitting balance-Leahy Scale: Good Sitting balance - Comments: steady seated EOB to don sock   Standing balance support: Bilateral upper extremity supported, During functional activity, Reliant on assistive device for balance, Single extremity supported, No upper extremity supported Standing balance-Leahy Scale: Fair Standing balance comment: no LOB during session; dependent on RW                           ADL either performed or assessed with clinical judgement   ADL Overall ADL's : Needs assistance/impaired  Lower Body Dressing: Supervision/safety;Sitting/lateral leans Lower Body Dressing Details (indicate cue type and reason): to don sock seated at EOB             Functional mobility during ADLs:  Contact guard assist;Rolling walker (2 wheels);Supervision/safety       Vision         Perception         Praxis         Pertinent Vitals/Pain Pain Assessment Pain Assessment: No/denies pain Pain Intervention(s): Monitored during session     Extremity/Trunk Assessment Upper Extremity Assessment Upper Extremity Assessment: Overall WFL for tasks assessed   Lower Extremity Assessment Lower Extremity Assessment: Generalized weakness       Communication Communication Communication: No apparent difficulties Cueing Techniques: Verbal cues   Cognition Arousal: Alert Behavior During Therapy: WFL for tasks assessed/performed Overall Cognitive Status: History of cognitive impairments - at baseline Area of Impairment: Memory                       Following Commands: Follows one step commands consistently       General Comments: pleasant and agreeable to session; per chart review has difficulty managing meds     General Comments       Exercises Other Exercises Other Exercises: Edu on role of OT in acute setting and importance of therapy to maximize strength/safety/IND. Other Exercises: Edu on use of sock aide for donning socks to prevent overexertion.   Shoulder Instructions      Home Living Family/patient expects to be discharged to:: Private residence Living Arrangements: Children (reports she lives with son) Available Help at Discharge: Family Type of Home: House Home Access: Stairs to enter [employer] of Steps: 2-3 Entrance Stairs-Rails: Right Home Layout: Two level;Able to live on main level with bedroom/bathroom     Bathroom Shower/Tub: Walk-in shower         Home Equipment: Cane - single Librarian, academic (2 wheels);Rollator (4 wheels)          Prior Functioning/Environment Prior Level of Function : Independent/Modified Independent             Mobility Comments: ambulatory with rollator x several years although  reports not always using it, denies falls, no longer drives ADLs Comments: independent with bathing & dressing, light cooking & cleaning but primarily eats out        OT Problem List: Decreased strength;Cardiopulmonary status limiting activity;Decreased activity tolerance;Impaired balance (sitting and/or standing);Decreased knowledge of use of DME or AE;Decreased cognition;Obesity      OT Treatment/Interventions: Self-care/ADL training;Therapeutic exercise;Therapeutic activities;Energy conservation;DME and/or AE instruction;Patient/family education;Balance training    OT Goals(Current goals can be found in the care plan section) Acute Rehab OT Goals Patient Stated Goal: improve endurance OT Goal Formulation: With patient Time For Goal Achievement: 03/23/23 Potential to Achieve Goals: Good ADL Goals Pt Will Perform Upper Body Bathing: with set-up;sitting Pt Will Perform Lower Body Bathing: with supervision;sitting/lateral leans;with adaptive equipment Pt Will Perform Lower Body Dressing: with modified independence;with adaptive equipment;sitting/lateral leans;sit to/from stand Pt Will Transfer to Toilet: with modified independence;ambulating (with LRAD) Pt Will Perform Toileting - Clothing Manipulation and hygiene: with modified independence;sitting/lateral leans;sit to/from stand Additional ADL Goal #1: Pt will utilize learned ECS and use of AE/AD during ADL performance and mobiltiy tasks to prevent overexertion on 3/3 opportunities.  OT Frequency: Min 1X/week    Co-evaluation PT/OT/SLP Co-Evaluation/Treatment: Yes Reason for Co-Treatment: For patient/therapist safety;To address functional/ADL transfers PT goals addressed during session:  Mobility/safety with mobility OT goals addressed during session: ADL's and self-care      AM-PAC OT "6 Clicks" Daily Activity     Outcome Measure Help from another person eating meals?: None Help from another person taking care of personal  grooming?: A Little Help from another person toileting, which includes using toliet, bedpan, or urinal?: A Little Help from another person bathing (including washing, rinsing, drying)?: A Little Help from another person to put on and taking off regular upper body clothing?: None Help from another person to put on and taking off regular lower body clothing?: A Little 6 Click Score: 20   End of Session Equipment Utilized During Treatment: Rolling walker (2 wheels);Gait belt Nurse Communication: Mobility status  Activity Tolerance: Patient tolerated treatment well Patient left: in bed;with call bell/phone within reach;with bed alarm set  OT Visit Diagnosis: Other abnormalities of gait and mobility (R26.89);Muscle weakness (generalized) (M62.81)                Time: 9528-4132 OT Time Calculation (min): 16 min Charges:  OT General Charges $OT Visit: 1 Visit OT Evaluation $OT Eval Low Complexity: 1 Low  Saharsh Sterling, OTR/L 03/09/23, 9:27 AM  Marien Manship E Aeon Kessner 03/09/2023, 9:24 AM

## 2023-03-09 NOTE — Discharge Instructions (Signed)
 Your Home Health Provider will be [facility].   [facility].  [address] , [place], [place], [address]. [phone] They will call you to arrange when they can come to see you

## 2023-03-09 NOTE — Evaluation (Signed)
 Physical Therapy Evaluation Patient Details Name: MARKITTA PLAGER MRN: [id number] DOB: March 20, 1940 Today's Date: 03/09/2023  History of Present Illness  83 y.o. female who presented with shortness of breath and increasing bilateral lower extremity edema. 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   Clinical Impression  Patient received in bed, sleeping. She is agreeable to PT/OT assessment. Patient is mod I/supervision for bed mobility and transfers. Ambulated ~80 feet in hallway with RW and cga. Patient will continue to benefit from skilled PT to improve independence and safety.         If plan is discharge home, recommend the following: Assistance with cooking/housework;Assist for transportation;Help with stairs or ramp for entrance;A little help with walking and/or transfers;A little help with bathing/dressing/bathroom;Direct supervision/assist for medications management;Direct supervision/assist for financial management   Can travel by private vehicle    yes    Equipment Recommendations None recommended by PT  Recommendations for Other Services       Functional Status Assessment Patient has had a recent decline in their functional status and demonstrates the ability to make significant improvements in function in a reasonable and predictable amount of time.     Precautions / Restrictions Precautions Precautions: Fall Restrictions Weight Bearing Restrictions Per Provider Order: No      Mobility  Bed Mobility Overal bed mobility: Needs Assistance Bed Mobility: Supine to Sit, Sit to Supine     Supine to sit: Supervision Sit to supine: Supervision   General bed mobility comments: SUP for bed mobility tasks with no physical assist    Transfers Overall transfer level: Needs assistance Equipment used: Rolling walker (2 wheels) Transfers: Sit to/from  Stand Sit to Stand: Supervision           General transfer comment: SUP for STS from EOB to RW, light CGA for mobility into the hallway and back; brief soiled so removed and replaced purewick and incontinence pads    Ambulation/Gait Ambulation/Gait assistance: Contact guard assist Gait Distance (Feet): 80 Feet Assistive device: Rolling walker (2 wheels) Gait Pattern/deviations: Step-through pattern, Decreased step length - right, Decreased step length - left, Decreased stride length, Trunk flexed Gait velocity: decreased     General Gait Details: Ambulated out in hallway in ED. No lob, no overt difficulties noted or reported  Careers information officer     Tilt Bed    Modified Rankin (Stroke Patients Only)       Balance Overall balance assessment: Needs assistance Sitting-balance support: Feet supported Sitting balance-Leahy Scale: Good     Standing balance support: Bilateral upper extremity supported, During functional activity, Reliant on assistive device for balance Standing balance-Leahy Scale: Fair Standing balance comment: no LOB during session; dependent on RW                             Pertinent Vitals/Pain Pain Assessment Pain Assessment: No/denies pain    Home Living Family/patient expects to be discharged to:: Private residence Living Arrangements: Children Available Help at Discharge: Family;Available PRN/intermittently Type of Home: House Home Access: Stairs to enter Entrance Stairs-Rails: Right Entrance Stairs-Number of Steps: 2-3   Home Layout: Two level;Able to live on main level with bedroom/bathroom Home Equipment: Cane - single point;Rolling Walker (2 wheels);Rollator (4 wheels)      Prior Function Prior  Level of Function : Independent/Modified Independent             Mobility Comments: ambulatory with rollator x several years although reports not always using it, denies falls, no longer drives ADLs  Comments: independent with bathing & dressing, light cooking & cleaning but primarily eats out     Extremity/Trunk Assessment   Upper Extremity Assessment Upper Extremity Assessment: Defer to OT evaluation    Lower Extremity Assessment Lower Extremity Assessment: Generalized weakness    Cervical / Trunk Assessment Cervical / Trunk Assessment: Normal  Communication   Communication Communication: Hearing impairment Cueing Techniques: Verbal cues  Cognition Arousal: Alert Behavior During Therapy: WFL for tasks assessed/performed Overall Cognitive Status: History of cognitive impairments - at baseline Area of Impairment: Memory, Awareness, Problem solving                 Orientation Level: Disoriented to, Situation   Memory: Decreased short-term memory Following Commands: Follows one step commands with increased time Safety/Judgement: Decreased awareness of deficits, Decreased awareness of safety Awareness: Intellectual Problem Solving: Slow processing, Decreased initiation, Requires verbal cues General Comments: pleasant and agreeable to session; per chart review has difficulty managing meds        General Comments      Exercises     Assessment/Plan    PT Assessment Patient needs continued PT services  PT Problem List Decreased activity tolerance;Decreased mobility;Obesity;Decreased strength       PT Treatment Interventions DME instruction;Balance training;Gait training;Neuromuscular re-education;Stair training;Patient/family education;Functional mobility training;Therapeutic activities;Therapeutic exercise;Modalities;Cognitive remediation    PT Goals (Current goals can be found in the Care Plan section)  Acute Rehab PT Goals Patient Stated Goal: get better, return home PT Goal Formulation: With patient Time For Goal Achievement: 03/16/23 Potential to Achieve Goals: Good    Frequency Min 1X/week     Co-evaluation   Reason for Co-Treatment: For  patient/therapist safety;To address functional/ADL transfers PT goals addressed during session: Mobility/safety with mobility OT goals addressed during session: ADL's and self-care       AM-PAC PT "6 Clicks" Mobility  Outcome Measure Help needed turning from your back to your side while in a flat bed without using bedrails?: None Help needed moving from lying on your back to sitting on the side of a flat bed without using bedrails?: None Help needed moving to and from a bed to a chair (including a wheelchair)?: None Help needed standing up from a chair using your arms (e.g., wheelchair or bedside chair)?: None Help needed to walk in hospital room?: A Little Help needed climbing 3-5 steps with a railing? : A Lot 6 Click Score: 21    End of Session Equipment Utilized During Treatment: Gait belt Activity Tolerance: Patient tolerated treatment well Patient left: in bed;with call bell/phone within reach;with bed alarm set Nurse Communication: Mobility status PT Visit Diagnosis: Muscle weakness (generalized) (M62.81)    Time: 4098-1191 PT Time Calculation (min) (ACUTE ONLY): 16 min   Charges:   PT Evaluation $PT Eval Moderate Complexity: 1 Mod   PT General Charges $$ ACUTE PT VISIT: 1 Visit         Mirranda Monrroy, PT, GCS 03/09/23,10:19 AM

## 2023-03-09 NOTE — TOC Initial Note (Signed)
 Transition of Care [facility]) - Initial/Assessment Note    Patient Details  Name: [name] MRN: [id number] Date of Birth: 1940-04-23  [facility]) CM/SW Contact:    Tilmon Font, LCSW Phone Number: 03/09/2023, 3:29 PM  Clinical Narrative:                  CSW met with pt at bedside to present Home Health agencies that accept pt's insurance. Pt chose [facility]. Pt appeared slightly disoriented so contacted pt's son to discuss home health; he is in agreement. Pt will return him via a private vehicle. Pt has a walker at home, but does not use the walker. CSW contacted [place]  at [facility] to complete referral. No further TOC needs at this time.   Expected Discharge Plan: Home w Home Health Services Barriers to Discharge: Continued Medical Work up   Patient Goals and CMS Choice   CMS Medicare.gov Compare Post Acute Care list provided to:: Patient Choice offered to / list presented to : Patient      Expected Discharge Plan and Services       Living arrangements for the past 2 months: Single Family Home                           HH Arranged: PT HH Agency: [facility] Date [facility] Agency Contacted: 03/09/23   Representative spoke with at [facility] Agency: [place]   Prior Living Arrangements/Services Living arrangements for the past 2 months: Single Family Home Lives with:: Adult Children, Self Patient language and need for interpreter reviewed:: Yes Do you feel safe going back to the place where you live?: Yes      Need for Family Participation in Patient Care: Yes (Comment) Care giver support system in place?: Yes (comment) Current home services: DME, Home PT Criminal Activity/Legal Involvement Pertinent to Current Situation/Hospitalization: No - Comment as needed  Activities of Daily Living      Permission Sought/Granted Permission sought to share information with : Family Supports Permission granted to share information with : Yes,  Verbal Permission Granted  Share Information with NAME: Son  Permission granted to share info w AGENCY: [facility]        Emotional Assessment Appearance:: Appears stated age   Affect (typically observed): Appropriate Orientation: : Oriented to Self, Oriented to Place Alcohol / Substance Use: Not Applicable Psych Involvement: No (comment)  Admission diagnosis:  CHF (congestive heart failure) (HCC) [I50.9] Patient Active Problem List   Diagnosis Date Noted   CHF (congestive heart failure) (HCC) 03/08/2023   Acute respiratory failure with hypoxia and hypercapnia (HCC) 02/18/2023   Dilated cardiomyopathy (HCC) 02/18/2023   Cardiac arrest (HCC) 02/14/2023   Cardiac arrest due to respiratory disorder (HCC) 02/12/2023   Coronary artery disease due to lipid rich plaque 02/12/2023   Elevated troponin 02/12/2023   Shock circulatory (HCC) 02/11/2023   Osteomyelitis of left foot (HCC) 01/23/2022   Cellulitis of left lower extremity 01/22/2022   Acute on chronic heart failure with preserved ejection fraction (HFpEF) (HCC) 01/22/2022   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   Morbid obesity (HCC)    Psoriasis    Hypertension    Hyperlipemia    Fibromyalgia    Non-insulin  dependent type 2 diabetes mellitus (HCC)    Severe aortic stenosis    NAFLD (nonalcoholic fatty liver disease) 08/65/7846   COLONIC POLYPS, ADENOMATOUS 07/27/2004   EXTERNAL HEMORRHOIDS 07/27/2004   PCP:  Candiss Chamorro, MD Pharmacy:   [id number] - [address] [place] - [address] AT [facility] [address] [place] [address] Phone: [phone] Fax: [phone]     Social Drivers of Health (SDOH) Social History: SDOH Screenings   Food Insecurity: No Food Insecurity (02/19/2023)  Housing: Low Risk  (02/19/2023)  Transportation Needs: No Transportation Needs (02/19/2023)  Utilities: Not At Risk (02/19/2023)  Alcohol Screen: Low Risk  (02/18/2023)   Financial Resource Strain: Medium Risk (02/18/2023)  Social Connections: Moderately Isolated (02/22/2023)  Stress: No Stress Concern Present (10/22/2022)   Received from [facility]  Tobacco Use: Medium Risk (02/18/2023)  Health Literacy: Low Risk  (10/22/2022)   Received from [facility]   SDOH Interventions:     Readmission Risk Interventions     No data to display

## 2023-03-09 NOTE — Progress Notes (Signed)
 Progress Note   Patient: [name] START ZOX:[id number] DOB: 13-Dec-1940 DOA: 03/08/2023     1 DOS: the patient was seen and examined on 03/09/2023   Brief hospital course: Acute on chronic CHF decompensation, likely secondary to noncompliance with Lasix .   Expect 2 to 3 days IV diuresis.   Assessment and Plan:  Acute on chronic HFrEF decompensation: slowly improving -Continue IV Lasix  80 mg twice daily -Echocardiogram and LHC were done on last admission, no indication for further cardiology workup -Consult case management for home care/visiting nurse services   Chronic lymphedema -Treated with antibiotics sensitive admission for septic shock.  Clinically appears to be mainly venous stasis skin changes.  Hold off further antibiotic treatment   Nonischemic cardiomyopathy Recent cardiac pulmonary arrest -EKG showed prolonged QRS, outpatient cardiology follow-up for CRT and AICD evaluation   CKD stage II -Fluid overload, creatinine level stable, on IV Lasix    HTN -BP stable, continue Coreg  and enalapril    IIDM -On high-dose of Lasix , hold off metformin and Jardiance -Start SSI         Subjective: Shortness of breath improving, still has significant swelling in both legs But denies dizziness, chest pain, fever, chills, nausea, abdominal pain, constipation/diarrhea.  Physical Exam: Vitals:   03/09/23 0300 03/09/23 0407 03/09/23 0524 03/09/23 0945  BP: (!) 90/44 (!) 124/47 (!) 114/48 131/72  Pulse: 63 66 69 60  Resp: 19 20 19 20   Temp:   97.8 F (36.6 C) (!) 97.2 F (36.2 C)  TempSrc:   Oral Oral  SpO2: 98% 95% 96% 97%  Weight:      Height:       Physical Exam Constitutional:      General: She is not in acute distress.    Appearance: She is ill-appearing.  HENT:     Head: Normocephalic and atraumatic.     Nose: Nose normal.     Mouth/Throat:     Mouth: Mucous membranes are moist.     Pharynx: Oropharynx is clear. No oropharyngeal exudate.  Eyes:     Extraocular  Movements: Extraocular movements intact.     Conjunctiva/sclera: Conjunctivae normal.  Cardiovascular:     Rate and Rhythm: Normal rate and regular rhythm.     Pulses: Normal pulses.     Heart sounds: Normal heart sounds.  Pulmonary:     Effort: Pulmonary effort is normal.     Breath sounds: Rales present.  Abdominal:     General: Abdomen is flat. Bowel sounds are normal.     Palpations: Abdomen is soft.  Musculoskeletal:        General: No tenderness. Normal range of motion.     Cervical back: Normal range of motion. No rigidity.     Right lower leg: Edema present.     Left lower leg: Edema present.  Skin:    General: Skin is warm.     Capillary Refill: Capillary refill takes 2 to 3 seconds.     Findings: No rash.  Neurological:     General: No focal deficit present.     Mental Status: She is alert.     Cranial Nerves: No cranial nerve deficit.     Sensory: No sensory deficit.     Motor: No weakness.  Psychiatric:        Mood and Affect: Mood normal.        Thought Content: Thought content normal.     Data Reviewed:  Latest Reference Range & Units 03/09/23 06:28  Sodium 135 -  145 mmol/L 140  Potassium 3.5 - 5.1 mmol/L 3.5  Chloride 98 - 111 mmol/L 103  CO2 22 - 32 mmol/L 27  Glucose 70 - 99 mg/dL 90  BUN 8 - 23 mg/dL 24 (H)  Creatinine 2.84 - 1.00 mg/dL 1.32 (H)  Calcium  8.9 - 10.3 mg/dL 8.1 (L)  Anion gap 5 - 15  10  GFR, Estimated >60 mL/min 44 (L)  (H): Data is abnormally high (L): Data is abnormally low  Family Communication: talked to patient and updated plan of care  Disposition: Status is: Inpatient   Planned Discharge Destination: Home    Time spent: 35 minutes  Author: Suzan Erm, MD 03/09/2023 2:46 PM  For on call review [email or web address].

## 2023-03-10 DIAGNOSIS — I5023 Acute on chronic systolic (congestive) heart failure: Secondary | ICD-10-CM | POA: Diagnosis not present

## 2023-03-10 LAB — BASIC METABOLIC PANEL
Anion gap: 11 (ref 5–15)
BUN: 27 mg/dL — ABNORMAL HIGH (ref 8–23)
CO2: 27 mmol/L (ref 22–32)
Calcium: 8.1 mg/dL — ABNORMAL LOW (ref 8.9–10.3)
Chloride: 102 mmol/L (ref 98–111)
Creatinine, Ser: 1.31 mg/dL — ABNORMAL HIGH (ref 0.44–1.00)
GFR, Estimated: 41 mL/min — ABNORMAL LOW (ref >60)
Glucose, Bld: 103 mg/dL — ABNORMAL HIGH (ref 70–99)
Potassium: 3.5 mmol/L (ref 3.5–5.1)
Sodium: 140 mmol/L (ref 135–145)

## 2023-03-10 LAB — CBG MONITORING, ED
Glucose-Capillary: 103 mg/dL — ABNORMAL HIGH (ref 70–99)
Glucose-Capillary: 125 mg/dL — ABNORMAL HIGH (ref 70–99)
Glucose-Capillary: 127 mg/dL — ABNORMAL HIGH (ref 70–99)
Glucose-Capillary: 163 mg/dL — ABNORMAL HIGH (ref 70–99)

## 2023-03-10 MED ORDER — FUROSEMIDE 40 MG PO TABS
80.0000 mg | ORAL_TABLET | Freq: Two times a day (BID) | ORAL | Status: DC
  Administered 2023-03-10 – 2023-03-11 (×2): 80 mg via ORAL
  Filled 2023-03-10 (×2): qty 2

## 2023-03-10 MED ORDER — POTASSIUM CHLORIDE CRYS ER 20 MEQ PO TBCR
20.0000 meq | EXTENDED_RELEASE_TABLET | Freq: Once | ORAL | Status: AC
  Administered 2023-03-10: 20 meq via ORAL
  Filled 2023-03-10: qty 1

## 2023-03-10 NOTE — ED Notes (Signed)
Fsbs 163

## 2023-03-10 NOTE — Progress Notes (Addendum)
Progress Note   Patient: [name] ZOX:[id number] DOB: 12/25/1940 DOA: 03/08/2023     2 DOS: the patient was seen and examined on 03/10/2023   Brief hospital course: Acute on chronic CHF decompensation, likely secondary to noncompliance with Lasix.   Expect 2 to 3 days IV diuresis.   Assessment and Plan:  Acute on chronic HFrEF decompensation: almost improving -Change IV Lasix 80 mg to PO 80mg  BID from this evening. Possible discharge tomorrow -Echocardiogram and LHC were done on last admission, no indication for further cardiology workup -Consult case management for home care/visiting nurse services   Chronic lymphedema -Treated with antibiotics sensitive admission for septic shock.  Clinically appears to be mainly venous stasis skin changes.  Hold off further antibiotic treatment   Nonischemic cardiomyopathy Recent cardiac pulmonary arrest -EKG showed prolonged QRS, outpatient cardiology follow-up for CRT and AICD evaluation   CKD stage II: Slight increase in Cr from yesterday -Fluid overload improving, creatinine level stable,  Resume Lasix this evening   HTN -BP stable, continue Coreg and enalapril   IIDM -On high-dose of Lasix, hold off metformin and Jardiance -Start SSI         Subjective: Shortness of breath improved, significant improvement with swelling in both legs But denies dizziness, chest pain, fever, chills, nausea, abdominal pain, constipation/diarrhea.  Physical Exam: Vitals:   03/10/23 0142 03/10/23 0400 03/10/23 0500 03/10/23 0720  BP: 129/84 (!) 109/43 130/66 (!) 141/54  Pulse: 63 60 63 70  Resp: 10 18 (!) 21   Temp: 97.6 F (36.4 C) 97.8 F (36.6 C)    TempSrc: Oral Oral    SpO2: 98% 96%    Weight:      Height:       Physical Exam Constitutional:      General: She is not in acute distress.    Appearance: She is not ill-appearing.  HENT:     Head: Normocephalic and atraumatic.     Nose: Nose normal.     Mouth/Throat:     Mouth:  Mucous membranes are moist.     Pharynx: Oropharynx is clear. No oropharyngeal exudate.  Eyes:     Extraocular Movements: Extraocular movements intact.     Conjunctiva/sclera: Conjunctivae normal.  Cardiovascular:     Rate and Rhythm: Normal rate and regular rhythm.     Pulses: Normal pulses.     Heart sounds: Normal heart sounds.  Pulmonary:     Effort: Pulmonary effort is normal.     Breath sounds: Normal breath sounds.  Abdominal:     General: Abdomen is flat. Bowel sounds are normal.     Palpations: Abdomen is soft.  Musculoskeletal:        General: No tenderness. Normal range of motion.     Cervical back: Normal range of motion. No rigidity.     Right lower leg: Edema present.     Left lower leg: Edema present.     Comments: Edema is improving  (trace to 1+)  Skin:    General: Skin is warm.     Capillary Refill: Capillary refill takes 2 to 3 seconds.     Findings: No rash.  Neurological:     General: No focal deficit present.     Mental Status: She is alert.     Cranial Nerves: No cranial nerve deficit.     Sensory: No sensory deficit.     Motor: No weakness.  Psychiatric:        Mood and Affect: Mood  normal.        Thought Content: Thought content normal.     Data Reviewed:  Latest Reference Range & Units 03/10/23 06:44  Sodium 135 - 145 mmol/L 140  Potassium 3.5 - 5.1 mmol/L 3.5  Chloride 98 - 111 mmol/L 102  CO2 22 - 32 mmol/L 27  Glucose 70 - 99 mg/dL 409 (H)  BUN 8 - 23 mg/dL 27 (H)  Creatinine 8.11 - 1.00 mg/dL 9.14 (H)  Calcium 8.9 - 10.3 mg/dL 8.1 (L)  Anion gap 5 - 15  11  GFR, Estimated >60 mL/min 41 (L)  (H): Data is abnormally high (L): Data is abnormally low  Family Communication: talked to patient and updated plan of care  Disposition: Status is: Inpatient   Planned Discharge Destination: Home    Time spent: 35 minutes  Author: Ernestene Mention, MD 03/10/2023 9:00 AM  For on call review [email or web address].

## 2023-03-10 NOTE — ED Notes (Signed)
Pt up at side of bed eating breakfast

## 2023-03-10 NOTE — ED Notes (Signed)
Meds given  pt had dinner meal  pt watching tv

## 2023-03-10 NOTE — ED Notes (Signed)
This RN went to check on patient at this time as the primary RN was tied up with another patient. This patient was resting comfortably at this time.

## 2023-03-11 DIAGNOSIS — I5023 Acute on chronic systolic (congestive) heart failure: Secondary | ICD-10-CM | POA: Diagnosis not present

## 2023-03-11 LAB — BASIC METABOLIC PANEL
Anion gap: 10 (ref 5–15)
BUN: 30 mg/dL — ABNORMAL HIGH (ref 8–23)
CO2: 27 mmol/L (ref 22–32)
Calcium: 8.7 mg/dL — ABNORMAL LOW (ref 8.9–10.3)
Chloride: 103 mmol/L (ref 98–111)
Creatinine, Ser: 1.25 mg/dL — ABNORMAL HIGH (ref 0.44–1.00)
GFR, Estimated: 44 mL/min — ABNORMAL LOW (ref >60)
Glucose, Bld: 110 mg/dL — ABNORMAL HIGH (ref 70–99)
Potassium: 3.7 mmol/L (ref 3.5–5.1)
Sodium: 140 mmol/L (ref 135–145)

## 2023-03-11 LAB — GLUCOSE, CAPILLARY
Glucose-Capillary: 103 mg/dL — ABNORMAL HIGH (ref 70–99)
Glucose-Capillary: 164 mg/dL — ABNORMAL HIGH (ref 70–99)

## 2023-03-11 NOTE — TOC Progression Note (Signed)
Transition of Care [facility]) - Progression Note    Patient Details  Name: [name] MRN: [id number] Date of Birth: June 20, 1940  [facility]) CM/SW Contact  Truddie Hidden, RN Phone Number: 03/11/2023, 11:45 AM  Clinical Narrative:    Message sent to [place] from [facility] to confirm status of referral.     Expected Discharge Plan: Home w Home Health Services Barriers to Discharge: Continued Medical Work up  Expected Discharge Plan and Services       Living arrangements for the past 2 months: Single Family Home Expected Discharge Date: 03/11/23                         HH Arranged: PT HH Agency: [facility] Date [facility] Agency Contacted: 03/09/23   Representative spoke with at [facility] Agency: [place]   Social Determinants of Health (SDOH) Interventions SDOH Screenings   Food Insecurity: No Food Insecurity (03/11/2023)  Housing: Low Risk  (03/11/2023)  Transportation Needs: No Transportation Needs (03/11/2023)  Utilities: Not At Risk (03/11/2023)  Alcohol Screen: Low Risk  (02/18/2023)  Financial Resource Strain: Medium Risk (02/18/2023)  Social Connections: Moderately Isolated (03/11/2023)  Stress: No Stress Concern Present (10/22/2022)   Received from [facility]  Tobacco Use: Medium Risk (02/18/2023)  Health Literacy: Low Risk  (10/22/2022)   Received from [facility]    Readmission Risk Interventions     No data to display

## 2023-03-11 NOTE — Discharge Summary (Signed)
Physician Discharge Summary   Patient: [name] MRN: [id number] DOB: 1940/05/29  Admit date:     03/08/2023  Discharge date: 03/11/23  Discharge Physician: Ernestene Mention   PCP: Kaleen Mask, MD   Recommendations at discharge:  Follow up with PCP and cardiology clinic in 1-2 weeks of this discharge  Discharge Diagnoses: Active Problems:   CHF (congestive heart failure) (HCC)  Principal Problem (Resolved):   Acute on chronic systolic CHF (congestive heart failure) [facility])  Hospital Course: 83 years old female with history of chronic HFrEF with LVEF 30 to 45%, nonischemic cardiomyopathy, AS s/p TAVR, HTN, fatty liver, CKD stage II, chronic lymphedema, 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  Assessment and Plan:  Acute on chronic HFrEF decompensation: improved -Managed with IV lasix 80mg  BID--> Change IV Lasix 80 mg to PO 80mg  BID since last evening.  continue Coreg and enalapril -Echocardiogram and LHC were done on last admission, no indication for further cardiology workup -Consult case management for home care/visiting nurse services   Chronic lymphedema -Treated with antibiotics sensitive admission for septic shock.  Clinically appears to be mainly venous stasis skin changes.  Hold off further antibiotic treatment   Nonischemic cardiomyopathy Recent cardiac pulmonary arrest -EKG showed prolonged QRS, outpatient cardiology follow-up for CRT and AICD evaluation   CKD stage II: Cr plateaued from 1.31--> 1.25 F/u Cr as out patient while on diuretic therapy   HTN -BP stable, continue Coreg and enalapril   IIDM -On high-dose of Lasix,  Resume metformin and Jardiance after discharge   Physical debility: Ordered home health PT      Consultants: Heart failure team  Disposition: Home Diet recommendation:  Discharge Diet  Orders (From admission, onward)     Start     Ordered   03/11/23 0000  Diet - low sodium heart healthy        03/11/23 1125           Cardiac diet DISCHARGE MEDICATION: Allergies as of 03/11/2023       Reactions   Castor Oil    "passed out" ? SYNCOPE ?   Sulfonamide Derivatives    Other Reaction(s): Not available   Amoxicillin Diarrhea, Other (See Comments)   Has patient had a PCN reaction causing immediate rash, facial/tongue/throat swelling, SOB or lightheadedness with hypotension: No Has patient had a PCN reaction causing severe rash involving mucus membranes or skin necrosis: No Has patient had a PCN reaction that required hospitalization: No Has patient had a PCN reaction occurring within the last 10 years: No If all of the above answers are "NO", then may proceed with Cephalosporin use.   Demerol [meperidine] Nausea And Vomiting   Hydrocodone Nausea And Vomiting        Medication List     TAKE these medications    acetaminophen 325 MG tablet Commonly known as: TYLENOL Take 2 tablets (650 mg total) by mouth every 6 (six) hours as needed for mild pain, fever or headache.   atorvastatin 40 MG tablet Commonly known as: LIPITOR Take 1 tablet by mouth daily.   carvedilol 3.125 MG tablet Commonly known as: COREG Take 1 tablet (3.125 mg total) by mouth 2 (two) times daily with a meal.   Cranberry 250 MG Tabs Take 250 mg by mouth daily.   empagliflozin 10 MG Tabs tablet Commonly known as: JARDIANCE Take 1 tablet  by mouth daily.   enalapril 10 MG tablet Commonly known as: VASOTEC Take 10 mg by mouth daily.   furosemide 40 MG tablet Commonly known as: LASIX Take 1 tablet (40 mg total) by mouth 2 (two) times daily.   loratadine 10 MG tablet Commonly known as: CLARITIN Take 10 mg by mouth daily as needed.   metFORMIN 1000 MG tablet Commonly known as: GLUCOPHAGE Take 1,000 mg by mouth 2 (two) times daily.   potassium chloride SA 20 MEQ tablet Commonly  known as: KLOR-CON M Take 1 tablet (20 mEq total) by mouth daily.   traMADol 50 MG tablet Commonly known as: ULTRAM Take 1 tablet (50 mg total) by mouth every 8 (eight) hours as needed for moderate pain (pain score 4-6) or severe pain (pain score 7-10).   Vitamin D-3 125 MCG (5000 UT) Tabs Take 5,000 Units by mouth daily.        Discharge Exam: Filed Weights   03/08/23 0944  Weight: 128.3 kg   Constitutional:      General: She is not in acute distress.    Appearance: She is not ill-appearing.  HENT:     Head: Normocephalic and atraumatic.     Nose: Nose normal.     Mouth/Throat:     Mouth: Mucous membranes are moist.     Pharynx: Oropharynx is clear. No oropharyngeal exudate.  Eyes:     Extraocular Movements: Extraocular movements intact.     Conjunctiva/sclera: Conjunctivae normal.  Cardiovascular:     Rate and Rhythm: Normal rate and regular rhythm.     Pulses: Normal pulses.     Heart sounds: Normal heart sounds.  Pulmonary:     Effort: Pulmonary effort is normal.     Breath sounds: Normal breath sounds.  Abdominal:     General: Abdomen is flat. Bowel sounds are normal.     Palpations: Abdomen is soft.  Musculoskeletal:        General: No tenderness. Normal range of motion.     Cervical back: Normal range of motion. No rigidity.   Edema bother lower extremities is improving  (trace to 1+)  Skin:    General: Skin is warm.     Capillary Refill: Capillary refill takes 2 to 3 seconds.     Findings: No rash.  Neurological:     General: No focal deficit present.     Mental Status: She is alert.     Cranial Nerves: No cranial nerve deficit.     Sensory: No sensory deficit.     Motor: No weakness.  Psychiatric:        Mood and Affect: Mood normal.        Thought Content: Thought content normal.   Condition at discharge: stable  The results of significant diagnostics from this hospitalization (including imaging, microbiology, ancillary and laboratory) are listed  below for reference.   Imaging Studies: DG Chest 1 View Result Date: 03/08/2023 CLINICAL DATA:  Shortness of breath. EXAM: CHEST  1 VIEW COMPARISON:  Same day. FINDINGS: Stable cardiomegaly with mild central pulmonary vascular congestion. No consolidative process. Bony thorax is unremarkable. IMPRESSION: Stable cardiomegaly with mild central pulmonary vascular congestion. Electronically Signed   By: Lupita Raider M.D.   On: 03/08/2023 16:04   DG Chest 2 View Result Date: 03/08/2023 CLINICAL DATA:  Chest pain, shortness of breath EXAM: CHEST - 2 VIEW COMPARISON:  02/11/2023 FINDINGS: Cardiomegaly. Mediastinal contours within normal limits. Mild vascular congestion. No confluent opacities, effusions or overt  edema. Prior TAVR. No acute bony abnormality. IMPRESSION: Cardiomegaly, vascular congestion. Electronically Signed   By: Charlett Nose M.D.   On: 03/08/2023 10:36   CARDIAC CATHETERIZATION Result Date: 02/17/2023   Prox LAD to Mid LAD lesion is 10% stenosed.   Mid RCA lesion is 10% stenosed. 1.  Mild nonobstructive coronary artery disease. 2.  Mildly elevated left ventricular end-diastolic pressure at 14 mmHg. 3.  Very difficult engagement of the left coronary arteries due to being jailed by the TAVR prosthesis.\ 4.  Minimal gradient across the TAVR prosthesis Recommendations: Medical therapy for nonischemic cardiomyopathy. I switch IV furosemide to oral.   ECHOCARDIOGRAM COMPLETE Result Date: 02/12/2023    ECHOCARDIOGRAM REPORT   Patient Name:   [facility] A Wernette Date of Exam: 02/12/2023 Medical Rec #:  [id number]      Height:       62.0 in Accession #:    [id number]     Weight:       290.1 lb Date of Birth:  [birth year]      BSA:          2.239 m Patient Age:    82 years       BP:           104/59 mmHg Patient Gender: F              HR:           61 bpm. Exam Location:  [facility] Procedure: 2D Echo and Intracardiac Opacification Agent Indications:     Cardiac Arrest I46.9  History:         Patient has  prior history of Echocardiogram examinations, most                  recent 01/28/2021.                  Aortic Valve: 26 mm CoreValve-Evolut Pro prosthetic, stented                  (TAVR) valve is present in the aortic position.  Sonographer:     Overton Mam RDCS, FASE Referring Phys:  [id number] Judithe Modest Diagnosing Phys: Armanda Magic MD  Sonographer Comments: Echo performed with patient supine and on artificial respirator, patient is obese and no subcostal window. Image acquisition challenging due to patient body habitus and Image acquisition challenging due to respiratory motion. IMPRESSIONS  1. Left ventricular ejection fraction, by estimation, is 30 to 35%. The left ventricle has moderately decreased function. The left ventricle demonstrates regional wall motion abnormalities (see scoring diagram/findings for description) and global hypokinesis. There is mild concentric left ventricular hypertrophy. Left ventricular diastolic parameters are consistent with Grade II diastolic dysfunction (pseudonormalization). Elevated left ventricular end-diastolic pressure. There is akinesis of the  left ventricular, apical septal wall.  2. Right ventricular systolic function is normal. The right ventricular size is normal.  3. Left atrial size was severely dilated.  4. The mitral valve is degenerative. No evidence of mitral valve regurgitation. Mild mitral stenosis. The mean mitral valve gradient is 4.0 mmHg. Severe mitral annular calcification.  5. The aortic valve has been repaired/replaced. Perivalvular Aortic valve regurgitation is mild. 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 There is a 26 mm CoreValve-Evolut Pro prosthetic (TAVR) valve present in the aortic position. Aortic regurgitation PHT measures 557 msec. Aortic valve area, by VTI measures 0.93 cm. Aortic valve mean gradient measures 15.0 mmHg. Aortic valve Vmax measures 2.70 m/s. DVI 0.37.  6. The  inferior vena cava is normal in size with  greater than 50% respiratory variability, suggesting right atrial pressure of 3 mmHg.  7. Compared to echo dated 01/28/2021, LVF has declined but apical inferoseptal wall motion abnormality is unchanged. The mean TAVR gradient has increased from to and DVI has decreased from 0.92 to 0.37. FINDINGS  Left Ventricle: Left ventricular ejection fraction, by estimation, is 30 to 35%. The left ventricle has moderately decreased function. The left ventricle demonstrates regional wall motion abnormalities. Definity contrast agent was given IV to delineate the left ventricular endocardial borders. The left ventricular internal cavity size was normal in size. There is mild concentric left ventricular hypertrophy. Left ventricular diastolic parameters are consistent with Grade II diastolic dysfunction (pseudonormalization). Elevated left ventricular end-diastolic pressure. Right Ventricle: The right ventricular size is normal. No increase in right ventricular wall thickness. Right ventricular systolic function is normal. Left Atrium: Left atrial size was severely dilated. Right Atrium: Right atrial size was normal in size. Pericardium: There is no evidence of pericardial effusion. Mitral Valve: The mitral valve is degenerative in appearance. There is moderate thickening of the mitral valve leaflet(s). There is mild calcification of the mitral valve leaflet(s). Severe mitral annular calcification. No evidence of mitral valve regurgitation. Mild mitral valve stenosis. MV peak gradient, 8.8 mmHg. The mean mitral valve gradient is 4.0 mmHg. Tricuspid Valve: The tricuspid valve is normal in structure. Tricuspid valve regurgitation is trivial. No evidence of tricuspid stenosis. Aortic Valve: The aortic valve has been repaired/replaced. Aortic valve regurgitation is mild. Aortic regurgitation PHT measures 557 msec. Mild to moderate aortic stenosis is present. Aortic valve mean gradient measures 15.0 mmHg. Aortic valve peak  gradient measures 29.1 mmHg. Aortic valve area, by VTI measures 0.93 cm. There is a 26 mm CoreValve-Evolut Pro prosthetic, stented (TAVR) valve present in the aortic position. Pulmonic Valve: The pulmonic valve was normal in structure. Pulmonic valve regurgitation is not visualized. No evidence of pulmonic stenosis. Aorta: The aortic root is normal in size and structure. Venous: The inferior vena cava is normal in size with greater than 50% respiratory variability, suggesting right atrial pressure of 3 mmHg. IAS/Shunts: No atrial level shunt detected by color flow Doppler.  LEFT VENTRICLE PLAX 2D LVIDd:         5.00 cm      Diastology LVIDs:         3.90 cm      LV e' medial:    5.11 cm/s LV PW:         1.20 cm      LV E/e' medial:  24.5 LV IVS:        1.30 cm      LV e' lateral:   4.35 cm/s LVOT diam:     1.80 cm      LV E/e' lateral: 28.7 LV SV:         56 LV SV Index:   25 LVOT Area:     2.54 cm  LV Volumes (MOD) LV vol d, MOD A4C: 126.0 ml LV vol s, MOD A4C: 81.5 ml LV SV MOD A4C:     126.0 ml RIGHT VENTRICLE RV Basal diam:  2.80 cm RV S prime:     13.20 cm/s TAPSE (M-mode): 2.0 cm LEFT ATRIUM              Index        RIGHT ATRIUM           Index LA diam:  5.40 cm  2.41 cm/m   RA Area:     10.90 cm LA Vol (A2C):   173.0 ml 77.26 ml/m  RA Volume:   22.40 ml  10.00 ml/m LA Vol (A4C):   93.1 ml  41.58 ml/m LA Biplane Vol: 134.0 ml 59.84 ml/m  AORTIC VALVE                     PULMONIC VALVE AV Area (Vmax):    0.92 cm      PV Vmax:       0.95 m/s AV Area (Vmean):   0.92 cm      PV Peak grad:  3.6 mmHg AV Area (VTI):     0.93 cm AV Vmax:           269.67 cm/s AV Vmean:          181.333 cm/s AV VTI:            0.599 m AV Peak Grad:      29.1 mmHg AV Mean Grad:      15.0 mmHg LVOT Vmax:         97.40 cm/s LVOT Vmean:        65.400 cm/s LVOT VTI:          0.220 m LVOT/AV VTI ratio: 0.37 AI PHT:            557 msec  AORTA Ao Root diam: 2.50 cm Ao Asc diam:  3.20 cm MITRAL VALVE                TRICUSPID  VALVE MV Area (PHT): 1.82 cm     TV Peak grad:   34.6 mmHg MV Area VTI:   1.18 cm     TV Vmax:        2.94 m/s MV Peak grad:  8.8 mmHg MV Mean grad:  4.0 mmHg     SHUNTS MV Vmax:       1.48 m/s     Systemic VTI:  0.22 m MV Vmean:      91.2 cm/s    Systemic Diam: 1.80 cm MV Decel Time: 416 msec MV E velocity: 125.00 cm/s MV A velocity: 126.00 cm/s MV E/A ratio:  0.99 Armanda Magic MD Electronically signed by Armanda Magic MD Signature Date/Time: 02/12/2023/4:31:48 PM    Final    DG Chest Port 1 View Result Date: 02/11/2023 CLINICAL DATA:  Central line EXAM: PORTABLE CHEST 1 VIEW COMPARISON:  02/11/2023, CT 02/11/2023 FINDINGS: Endotracheal tube tip is about 2.8 cm superior to the carina. Esophageal tube tip below the diaphragm but incompletely visualized. Left IJ central venous catheter tip over the SVC confluence. Cardiomegaly with vascular congestion. Patchy left greater than right basilar airspace disease. No pneumothorax IMPRESSION: 1. Left IJ central venous catheter tip over the SVC confluence. No pneumothorax. 2. Cardiomegaly with vascular congestion. Patchy left greater than right basilar airspace disease, atelectasis versus pneumonia. This appears worse compared to prior Electronically Signed   By: Jasmine Pang M.D.   On: 02/11/2023 21:00   CT Angio Chest PE W/Cm &/Or Wo Cm Result Date: 02/11/2023 CLINICAL DATA:  Respiratory distress. Status post arrest. Status post CPR. Clinical concern for pulmonary embolus. EXAM: CT ANGIOGRAPHY CHEST WITH CONTRAST TECHNIQUE: Multidetector CT imaging of the chest was performed using the standard protocol during bolus administration of intravenous contrast. Multiplanar CT image reconstructions and MIPs were obtained to evaluate the vascular anatomy. RADIATION DOSE REDUCTION: This exam was performed according to  the departmental dose-optimization program which includes automated exposure control, adjustment of the mA and/or kV according to patient size and/or use of  iterative reconstruction technique. CONTRAST:  75mL OMNIPAQUE IOHEXOL 350 MG/ML SOLN COMPARISON:  11/14/2017 FINDINGS: Cardiovascular: Heart is enlarged. No substantial pericardial effusion. Status post TAVR. Mild atherosclerotic calcification is noted in the wall of the thoracic aorta. No large central pulmonary embolus. No evidence for lobar or segmental pulmonary embolus. Subsegmental pulmonary arteries to both lower lobes are not reliably evaluated due to bolus timing and motion artifact. Mediastinum/Nodes: 3.5 cm right thyroid nodule incompletely visualized. This has been evaluated on previous imaging. (Ref: J Am Coll Radiol. 2015 Feb;12(2): 143-50).No mediastinal lymphadenopathy. There is no hilar lymphadenopathy. The esophagus has normal imaging features. There is no axillary lymphadenopathy. Lungs/Pleura: Interlobular septal thickening noted in the upper lobes bilaterally with bilateral asymmetric central predominant ground-glass opacity. 5 mm right upper lobe nodule is stable on 43/7. 5 mm right lung nodule on 70/7 is in a region of collapse/consolidative disease. 5 mm right lung nodule on 67/7 is not substantially changed in the interval consistent with benign etiology. Tiny nodules again noted both lower lobes. Collapse/consolidative disease in the lower lungs bilaterally is associated with small bilateral pleural effusions. Upper Abdomen: Nodularity of the liver contour is compatible with cirrhosis. NG tube tip is positioned in the mid stomach. Musculoskeletal: No worrisome lytic or sclerotic osseous abnormality. Review of the MIP images confirms the above findings. IMPRESSION: 1. No large central pulmonary embolus. No evidence for lobar or segmental pulmonary embolus. Subsegmental pulmonary arteries to both lower lobes are not reliably evaluated due to bolus timing and motion artifact. 2. Interlobular septal thickening in the upper lobes bilaterally with bilateral asymmetric central predominant  ground-glass opacity. Imaging features are compatible with pulmonary edema. 3. Collapse/consolidative disease in the lower lungs bilaterally is associated with small bilateral pleural effusions. Lower lobe disease compatible with atelectasis and/or pneumonia. 4. Bilateral pulmonary nodules noted, unchanged since remote prior where visible although portions of both lungs have been obscured by airspace disease. Consider follow-up CT chest without contrast after resolution of acute symptoms for more definitive evaluation of pulmonary nodules. 5. Cirrhosis. 6.  Aortic Atherosclerosis (ICD10-I70.0). Electronically Signed   By: Kennith Center M.D.   On: 02/11/2023 15:13   CT Head Wo Contrast Result Date: 02/11/2023 CLINICAL DATA:  Mental status change, unknown cause EXAM: CT HEAD WITHOUT CONTRAST TECHNIQUE: Contiguous axial images were obtained from the base of the skull through the vertex without intravenous contrast. RADIATION DOSE REDUCTION: This exam was performed according to the departmental dose-optimization program which includes automated exposure control, adjustment of the mA and/or kV according to patient size and/or use of iterative reconstruction technique. COMPARISON:  None Available. FINDINGS: Brain: No evidence of acute infarction, hemorrhage, hydrocephalus, extra-axial collection or mass lesion/mass effect. Vascular: No hyperdense vessel. Skull: No acute fracture. Sinuses/Orbits: Mild paranasal sinus mucosal thickening. No acute orbital findings. Other: No mastoid effusions. IMPRESSION: No evidence of acute intracranial abnormality. An MRI could provide more sensitive evaluation for hypoxic/ischemic injury if clinically warranted Electronically Signed   By: Feliberto Harts M.D.   On: 02/11/2023 14:12   DG Abdomen 1 View Result Date: 02/11/2023 CLINICAL DATA:  Enteric catheter placement EXAM: ABDOMEN - 1 VIEW COMPARISON:  08/04/2007 FINDINGS: Supine frontal view of the abdomen and pelvis was  obtained, excluding the hemidiaphragms, left flank, lower pelvis by collimation. Enteric catheter passes below diaphragm, tip and side port projecting over the gastric fundus. Bowel gas  pattern is unremarkable without obstruction or ileus. There is an oval lucency projecting over the right iliac bone, of uncertain etiology but likely external to the patient. No masses or abnormal calcifications. No acute fracture. IMPRESSION: 1. Enteric catheter tip projecting over the gastric fundus. Electronically Signed   By: Sharlet Salina M.D.   On: 02/11/2023 11:25   DG Chest Portable 1 View Result Date: 02/11/2023 CLINICAL DATA:  Intubated EXAM: PORTABLE CHEST 1 VIEW COMPARISON:  12/06/2017 FINDINGS: Single frontal view of the chest demonstrates endotracheal tube overlying tracheal air column, tip approximately 2.5 cm above carina. Enteric catheter passes below diaphragm tip excluded by collimation. External defibrillator pads overlie the chest. Cardiac silhouette is enlarged. There is increased pulmonary vascular congestion without airspace disease, effusion, or pneumothorax. No acute bony abnormalities. IMPRESSION: 1. Support devices as above. 2. Pulmonary vascular congestion without overt edema. Electronically Signed   By: Sharlet Salina M.D.   On: 02/11/2023 11:23    Microbiology: Results for orders placed or performed during the hospital encounter of 03/08/23  Blood culture (routine x 2)     Status: None (Preliminary result)   Collection Time: 03/08/23  2:34 PM   Specimen: Left Antecubital; Blood  Result Value Ref Range Status   Specimen Description LEFT ANTECUBITAL  Final   Special Requests   Final    BOTTLES DRAWN AEROBIC AND ANAEROBIC Blood Culture adequate volume   Culture   Final    NO GROWTH 3 DAYS Performed at [facility], [address] [place], [place] [address]    Report Status PENDING  Incomplete  Blood culture (routine x 2)     Status: None (Preliminary result)   Collection  Time: 03/08/23  2:49 PM   Specimen: BLOOD  Result Value Ref Range Status   Specimen Description BLOOD BLOOD LEFT ARM  Final   Special Requests   Final    BOTTLES DRAWN AEROBIC AND ANAEROBIC Blood Culture results may not be optimal due to an inadequate volume of blood received in culture bottles   Culture   Final    NO GROWTH 3 DAYS Performed at [facility], [address] [place], [place] [address]    Report Status PENDING  Incomplete    Labs: CBC: Recent Labs  Lab 03/08/23 1004  WBC 7.5  HGB 10.1*  HCT 33.0*  MCV 91.2  PLT 167   Basic Metabolic Panel: Recent Labs  Lab 03/08/23 1004 03/09/23 0628 03/10/23 0644 03/11/23 0631  NA 143 140 140 140  K 4.0 3.5 3.5 3.7  CL 106 103 102 103  CO2 27 27 27 27   GLUCOSE 127* 90 103* 110*  BUN 24* 24* 27* 30*  CREATININE 1.30* 1.23* 1.31* 1.25*  CALCIUM 8.6* 8.1* 8.1* 8.7*   Liver Function Tests: No results for input(s): "AST", "ALT", "ALKPHOS", "BILITOT", "PROT", "ALBUMIN" in the last 168 hours. CBG: Recent Labs  Lab 03/10/23 1131 03/10/23 1637 03/10/23 2235 03/11/23 0853 03/11/23 1206  GLUCAP 125* 127* 163* 103* 164*    Discharge time spent: less than 30 minutes.  Signed: Ernestene Mention, MD [facility] 03/11/2023

## 2023-03-11 NOTE — Care Management Important Message (Signed)
Important Message  Patient Details  Name: [name] MRN: [id number] Date of Birth: July 27, 1940   Important Message Given:  Yes - Medicare IM     Sherilyn Banker 03/11/2023, 11:10 AM

## 2023-03-11 NOTE — TOC Transition Note (Signed)
Transition of Care [facility]) - Discharge Note   Patient Details  Name: [name] MRN: [id number] Date of Birth: 1940-07-09  Transition of Care [facility]) CM/SW Contact:  Truddie Hidden, RN Phone Number: 03/11/2023, 1:46 PM   Clinical Narrative:    Sherron Monday with Roxy Manns from [facility] to confirm patient is accepted for [facility]  Spoke with patient to advised [facility] would be her [facility] agency. She was advised a representative would contact her to scheduled the appointment with in 48 hours.   TOC signing off.    Final next level of care: Home w Home Health Services Barriers to Discharge: Continued Medical Work up   Patient Goals and CMS Choice   CMS Medicare.gov Compare Post Acute Care list provided to:: Patient Choice offered to / list presented to : Patient      Discharge Placement                       Discharge Plan and Services Additional resources added to the After Visit Summary for                            [facility] Arranged: PT [facility] Agency: [facility] Date [facility] Agency Contacted: 03/09/23   Representative spoke with at [facility] Agency: [place]  Social Drivers of Health (SDOH) Interventions SDOH Screenings   Food Insecurity: No Food Insecurity (03/11/2023)  Housing: Low Risk  (03/11/2023)  Transportation Needs: No Transportation Needs (03/11/2023)  Utilities: Not At Risk (03/11/2023)  Alcohol Screen: Low Risk  (02/18/2023)  Financial Resource Strain: Medium Risk (02/18/2023)  Social Connections: Moderately Isolated (03/11/2023)  Stress: No Stress Concern Present (10/22/2022)   Received from [facility]  Tobacco Use: Medium Risk (02/18/2023)  Health Literacy: Low Risk  (10/22/2022)   Received from [facility]     Readmission Risk Interventions     No data to display

## 2023-03-13 LAB — CULTURE, BLOOD (ROUTINE X 2)
Culture: NO GROWTH
Culture: NO GROWTH
Special Requests: ADEQUATE

## 2023-03-24 ENCOUNTER — Encounter: Payer: Self-pay | Admitting: *Deleted

## 2023-03-25 ENCOUNTER — Ambulatory Visit: Payer: Medicare HMO | Attending: Cardiovascular Disease | Admitting: Cardiovascular Disease

## 2023-03-25 NOTE — Progress Notes (Deleted)
 Cardiology Office Note:    Date:  03/25/2023   ID:  JOVON STREETMAN, DOB 1940/03/15, MRN [id number]  PCP:  Kaleen Mask, MD   [facility] Providers Cardiologist:  Tonny Bollman, MD     Referring MD: Kaleen Mask, *   No chief complaint on file.   History of Present Illness:    [name] is a 83 y.o. female presenting for follow-up of aortic valve disease. She has a history of aortic stenosis and underwent TAVR in 2019. Comorbid medical problems include hypertension, mixed hyperlipidemia, obesity, fibromyalgia, type 2 diabetes, and chronic diastolic heart failure. She was hospitalized in 01/2023 at [facility] with respiratory failure, CHF, and cardiac arrest. She underwent cath showing patent coronary arteries without obstructive disease and echo showing severe LV dysfunction with LVEF 30-35% and mild prosthetic aortic stenosis and insufficiency.    Current Medications: No outpatient medications have been marked as taking for the 03/25/23 encounter (Appointment) with Tonny Bollman, MD.     Allergies:   Castor oil, Sulfonamide derivatives, Amoxicillin, Demerol [meperidine], and Hydrocodone   ROS:   Please see the history of present illness.    All other systems reviewed and are negative.  EKGs/Labs/Other Studies Reviewed:    The following studies were reviewed today: Cardiac Studies & Procedures   CARDIAC CATHETERIZATION  CARDIAC CATHETERIZATION 02/17/2023  Narrative   Prox LAD to Mid LAD lesion is 10% stenosed.   Mid RCA lesion is 10% stenosed.  1.  Mild nonobstructive coronary artery disease. 2.  Mildly elevated left ventricular end-diastolic pressure at 14 mmHg. 3.  Very difficult engagement of the left coronary arteries due to being jailed by the TAVR prosthesis.\ 4.  Minimal gradient across the TAVR prosthesis  Recommendations: Medical therapy for nonischemic cardiomyopathy. I switch IV furosemide to oral.  Findings Coronary  Findings Diagnostic  Dominance: Right  Left Anterior Descending Vessel is large. Prox LAD to Mid LAD lesion is 10% stenosed.  First Diagonal Branch Vessel is moderate in size.  Left Circumflex  First Obtuse Marginal Branch  Right Coronary Artery Vessel is large. Mid RCA lesion is 10% stenosed.  Intervention  No interventions have been documented.   CARDIAC CATHETERIZATION  CARDIAC CATHETERIZATION 11/02/2017  Narrative  Mid RCA lesion is 10% stenosed.  Prox LAD to Mid LAD lesion is 10% stenosed.  1. Mild non-obstructive CAD 2. Severe aortic stenosis (peak to peak gradient 62 mmHg, mean gradient 51.6 mmHg, AVA 0.82 cm2)  Recommendations: Will continue workup for TAVR.  Findings Coronary Findings Diagnostic  Dominance: Right  Left Anterior Descending Vessel is large. Prox LAD to Mid LAD lesion is 10% stenosed.  First Diagonal Branch Vessel is moderate in size.  Left Circumflex  First Obtuse Marginal Branch  Right Coronary Artery Vessel is large. Mid RCA lesion is 10% stenosed.  Intervention  No interventions have been documented.    ECHOCARDIOGRAM  ECHOCARDIOGRAM COMPLETE 02/12/2023  Narrative ECHOCARDIOGRAM REPORT    Patient Name:   [name] Date of Exam: 02/12/2023 Medical Rec #:  [id number]      Height:       62.0 in Accession #:    [id number]     Weight:       290.1 lb Date of Birth:  1940-05-22      BSA:          2.239 m Patient Age:    82 years       BP:  104/59 mmHg Patient Gender: F              HR:           61 bpm. Exam Location:  [facility]  Procedure: 2D Echo and Intracardiac Opacification Agent  Indications:     Cardiac Arrest I46.9  History:         Patient has prior history of Echocardiogram examinations, most recent 01/28/2021. Aortic Valve: 26 mm CoreValve-Evolut Pro prosthetic, stented (TAVR) valve is present in the aortic position.  Sonographer:     Overton Mam RDCS, FASE Referring Phys:  [id number]  Judithe Modest Diagnosing Phys: Armanda Magic MD   Sonographer Comments: Echo performed with patient supine and on artificial respirator, patient is obese and no subcostal window. Image acquisition challenging due to patient body habitus and Image acquisition challenging due to respiratory motion. IMPRESSIONS   1. Left ventricular ejection fraction, by estimation, is 30 to 35%. The left ventricle has moderately decreased function. The left ventricle demonstrates regional wall motion abnormalities (see scoring diagram/findings for description) and global hypokinesis. There is mild concentric left ventricular hypertrophy. Left ventricular diastolic parameters are consistent with Grade II diastolic dysfunction (pseudonormalization). Elevated left ventricular end-diastolic pressure. There is akinesis of the left ventricular, apical septal wall. 2. Right ventricular systolic function is normal. The right ventricular size is normal. 3. Left atrial size was severely dilated. 4. The mitral valve is degenerative. No evidence of mitral valve regurgitation. Mild mitral stenosis. The mean mitral valve gradient is 4.0 mmHg. Severe mitral annular calcification. 5. The aortic valve has been repaired/replaced. Perivalvular Aortic valve regurgitation is mild. 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 There is a 26 mm CoreValve-Evolut Pro prosthetic (TAVR) valve present in the aortic position. Aortic regurgitation PHT measures 557 msec. Aortic valve area, by VTI measures 0.93 cm. Aortic valve mean gradient measures 15.0 mmHg. Aortic valve Vmax measures 2.70 m/s. DVI 0.37. 6. The inferior vena cava is normal in size with greater than 50% respiratory variability, suggesting right atrial pressure of 3 mmHg. 7. Compared to echo dated 01/28/2021, LVF has declined but apical inferoseptal wall motion abnormality is unchanged. The mean TAVR gradient has increased from to and DVI has decreased from 0.92  to 0.37.  FINDINGS Left Ventricle: Left ventricular ejection fraction, by estimation, is 30 to 35%. The left ventricle has moderately decreased function. The left ventricle demonstrates regional wall motion abnormalities. Definity contrast agent was given IV to delineate the left ventricular endocardial borders. The left ventricular internal cavity size was normal in size. There is mild concentric left ventricular hypertrophy. Left ventricular diastolic parameters are consistent with Grade II diastolic dysfunction (pseudonormalization). Elevated left ventricular end-diastolic pressure.  Right Ventricle: The right ventricular size is normal. No increase in right ventricular wall thickness. Right ventricular systolic function is normal.  Left Atrium: Left atrial size was severely dilated.  Right Atrium: Right atrial size was normal in size.  Pericardium: There is no evidence of pericardial effusion.  Mitral Valve: The mitral valve is degenerative in appearance. There is moderate thickening of the mitral valve leaflet(s). There is mild calcification of the mitral valve leaflet(s). Severe mitral annular calcification. No evidence of mitral valve regurgitation. Mild mitral valve stenosis. MV peak gradient, 8.8 mmHg. The mean mitral valve gradient is 4.0 mmHg.  Tricuspid Valve: The tricuspid valve is normal in structure. Tricuspid valve regurgitation is trivial. No evidence of tricuspid stenosis.  Aortic Valve: The aortic valve has been repaired/replaced. Aortic valve regurgitation  is mild. Aortic regurgitation PHT measures 557 msec. Mild to moderate aortic stenosis is present. Aortic valve mean gradient measures 15.0 mmHg. Aortic valve peak gradient measures 29.1 mmHg. Aortic valve area, by VTI measures 0.93 cm. There is a 26 mm CoreValve-Evolut Pro prosthetic, stented (TAVR) valve present in the aortic position.  Pulmonic Valve: The pulmonic valve was normal in structure. Pulmonic valve  regurgitation is not visualized. No evidence of pulmonic stenosis.  Aorta: The aortic root is normal in size and structure.  Venous: The inferior vena cava is normal in size with greater than 50% respiratory variability, suggesting right atrial pressure of 3 mmHg.  IAS/Shunts: No atrial level shunt detected by color flow Doppler.   LEFT VENTRICLE PLAX 2D LVIDd:         5.00 cm      Diastology LVIDs:         3.90 cm      LV e' medial:    5.11 cm/s LV PW:         1.20 cm      LV E/e' medial:  24.5 LV IVS:        1.30 cm      LV e' lateral:   4.35 cm/s LVOT diam:     1.80 cm      LV E/e' lateral: 28.7 LV SV:         56 LV SV Index:   25 LVOT Area:     2.54 cm  LV Volumes (MOD) LV vol d, MOD A4C: 126.0 ml LV vol s, MOD A4C: 81.5 ml LV SV MOD A4C:     126.0 ml  RIGHT VENTRICLE RV Basal diam:  2.80 cm RV S prime:     13.20 cm/s TAPSE (M-mode): 2.0 cm  LEFT ATRIUM              Index        RIGHT ATRIUM           Index LA diam:        5.40 cm  2.41 cm/m   RA Area:     10.90 cm LA Vol (A2C):   173.0 ml 77.26 ml/m  RA Volume:   22.40 ml  10.00 ml/m LA Vol (A4C):   93.1 ml  41.58 ml/m LA Biplane Vol: 134.0 ml 59.84 ml/m AORTIC VALVE                     PULMONIC VALVE AV Area (Vmax):    0.92 cm      PV Vmax:       0.95 m/s AV Area (Vmean):   0.92 cm      PV Peak grad:  3.6 mmHg AV Area (VTI):     0.93 cm AV Vmax:           269.67 cm/s AV Vmean:          181.333 cm/s AV VTI:            0.599 m AV Peak Grad:      29.1 mmHg AV Mean Grad:      15.0 mmHg LVOT Vmax:         97.40 cm/s LVOT Vmean:        65.400 cm/s LVOT VTI:          0.220 m LVOT/AV VTI ratio: 0.37 AI PHT:            557 msec  AORTA Ao Root diam: 2.50 cm Ao  Asc diam:  3.20 cm  MITRAL VALVE                TRICUSPID VALVE MV Area (PHT): 1.82 cm     TV Peak grad:   34.6 mmHg MV Area VTI:   1.18 cm     TV Vmax:        2.94 m/s MV Peak grad:  8.8 mmHg MV Mean grad:  4.0 mmHg     SHUNTS MV Vmax:        1.48 m/s     Systemic VTI:  0.22 m MV Vmean:      91.2 cm/s    Systemic Diam: 1.80 cm MV Decel Time: 416 msec MV E velocity: 125.00 cm/s MV A velocity: 126.00 cm/s MV E/A ratio:  0.99  Armanda Magic MD Electronically signed by Armanda Magic MD Signature Date/Time: 02/12/2023/4:31:48 PM    Final  TEE  ECHO TEE 12/06/2017  Narrative *[place]* *[facility]* [address] [place], [place] [address] [phone]  ------------------------------------------------------------------- Transesophageal Echocardiography  Patient:    Torin, Whisner MR #:       [id number] Study Date: 12/06/2017 Gender:     F Age:        77 Height:     156.2 cm Weight:     138.7 kg BSA:        2.55 m^2 Pt. Status: Room:       4E22C  SONOGRAPHER  Lavenia Atlas, RCS ADMITTING    Tonny Bollman, MD ATTENDING    Tonny Bollman, MD ORDERING     Tonny Bollman, MD REFERRING    Tonny Bollman, MD PERFORMING   Tobias Alexander, M.D.  cc:  ------------------------------------------------------------------- LV EF: 55% -   60%  ------------------------------------------------------------------- Indications:      Aortic stenosis 424.1.  ------------------------------------------------------------------- History:   PMH:   Aortic valve disease.  Risk factors: Hypertension. Diabetes mellitus. Dyslipidemia.  ------------------------------------------------------------------- Study Conclusions  - Left ventricle: Systolic function was normal. The estimated ejection fraction was in the range of 55% to 60%. Wall motion was normal; there were no regional wall motion abnormalities. - Aortic valve: Valve mobility was restricted. There was mild regurgitation. Valve area (VTI): 0.91 cm^2. Valve area (Vmean): 0.91 cm^2. - Mitral valve: There was mild regurgitation. - Left atrium: No evidence of thrombus in the atrial cavity or appendage. - Right atrium: No evidence of thrombus in the  atrial cavity or appendage. - Atrial septum: There was increased thickness of the septum, consistent with lipomatous hypertrophy. - Pulmonary arteries: PA peak pressure: 45 mm Hg (S).  Impressions:  - This was a periprocedural TEE during a TAVR procedure. Native aortic valve was severely thickened and calcified with severely restricted leaflet opening. Peak/mean gradients are 87/47 mmHg consistent with severe aortic stenosis. A 26 mm Medtronic Evolut Pro valve was successfully deployed in the aortic position. There was mild to moderate paravalvular leak post deployment. A postdilation with an extra cc of saline was performed with improvement of the paravalvular leak to mild. Postdeployment transaortic gradients were 17/7 mmHg.  ------------------------------------------------------------------- Study data:   Study status:  Routine.  Consent:  The risks, benefits, and alternatives to the procedure were explained to the patient and informed consent was obtained.  Procedure:  The patient reported no pain pre or post test. Initial setup. The patient was brought to the laboratory. Surface ECG leads were monitored. Sedation. General anesthesia was administered by anesthesiology staff. Sedation. Conscious sedation was administered. Transesophageal echocardiography. Topical  anesthesia was obtained using viscous lidocaine. A transesophageal probe was inserted by the attending cardiologist. Image quality was adequate.  Study completion:  The patient tolerated the procedure well. There were no complications.          Diagnostic transesophageal echocardiography.  2D and color Doppler.  Birthdate:  Patient birthdate: 04-07-1940.  Age:  Patient is 83 yr old.  Sex:  Gender: female.    BMI: 56.9 kg/m^2.  Blood pressure:     112/70  Patient status:  Inpatient.  Study date:  Study date: 12/06/2017. Study time: 10:39 AM.  Location:  Operating  room.  -------------------------------------------------------------------  ------------------------------------------------------------------- Left ventricle:  Systolic function was normal. The estimated ejection fraction was in the range of 55% to 60%. Wall motion was normal; there were no regional wall motion abnormalities.  ------------------------------------------------------------------- Aortic valve:  S/P TAVR with a A 26 mm Medtronic Evolut Pro valve. Trileaflet; severely thickened, severely calcified leaflets. Cusp separation was normal. Valve mobility was restricted.  Doppler: There was mild regurgitation.    VTI ratio of LVOT to aortic valve: 0.24. Valve area (VTI): 0.91 cm^2. Indexed valve area (VTI): 0.36 cm^2/m^2. Mean velocity ratio of LVOT to aortic valve: 0.24. Valve area (Vmean): 0.91 cm^2. Indexed valve area (Vmean): 0.36 cm^2/m^2. Mean gradient (S): 48 mm Hg.  ------------------------------------------------------------------- Aorta:  There was no atheroma. There was no evidence for dissection. Aortic root: The aortic root was not dilated. Ascending aorta: The ascending aorta was normal in size. Aortic arch: The aortic arch was normal in size. Descending aorta: The descending aorta was normal in size.  ------------------------------------------------------------------- Mitral valve:   Structurally normal valve.   Leaflet separation was normal.  Doppler:  There was mild regurgitation.  ------------------------------------------------------------------- Left atrium:  The atrium was normal in size.  No evidence of thrombus in the atrial cavity or appendage. The appendage was morphologically a left appendage, multilobulated, and of normal size. Emptying velocity was normal.  ------------------------------------------------------------------- Atrial septum:  There was increased thickness of the septum, consistent with lipomatous  hypertrophy.  ------------------------------------------------------------------- Right ventricle:  The cavity size was normal. Wall thickness was normal. Systolic function was normal.  ------------------------------------------------------------------- Pulmonic valve:    Structurally normal valve.  ------------------------------------------------------------------- Tricuspid valve:   Structurally normal valve.   Leaflet separation was normal.  Doppler:  There was mild regurgitation.  ------------------------------------------------------------------- Pulmonary artery:   The main pulmonary artery was normal-sized.  ------------------------------------------------------------------- Right atrium:  The atrium was normal in size.  No evidence of thrombus in the atrial cavity or appendage. The appendage was morphologically a right appendage.  ------------------------------------------------------------------- Pericardium:  There was no pericardial effusion.  ------------------------------------------------------------------- Pre bypass:  Post bypass: Pre bypass: Post bypass:  ------------------------------------------------------------------- Measurements  Left ventricle                            Value          Reference Stroke volume, 2D                         95    ml       --------- Stroke volume/bsa, 2D                     37    ml/m^2   ---------  LVOT  Value          Reference LVOT ID, S                                22    mm       --------- LVOT area                                 3.8   cm^2     --------- LVOT mean velocity, S                     78.9  cm/s     --------- LVOT VTI, S                               24.9  cm       ---------  Aortic valve                              Value          Reference Aortic valve mean velocity, S             330   cm/s     --------- Aortic valve VTI, S                       104   cm        --------- Aortic mean gradient, S                   48    mm Hg    --------- VTI ratio, LVOT/AV                        0.24           --------- Aortic valve area, VTI                    0.91  cm^2     --------- Aortic valve area/bsa, VTI                0.36  cm^2/m^2 --------- Velocity ratio, mean, LVOT/AV             0.24           --------- Aortic valve area, mean velocity          0.91  cm^2     --------- Aortic valve area/bsa, mean               0.36  cm^2/m^2 --------- velocity  Pulmonary arteries                        Value          Reference PA pressure, S, DP                 (H)    45    mm Hg    <=30  Legend: (L)  and  (H)  mark values outside specified reference range.  ------------------------------------------------------------------- Prepared and Electronically Authenticated by  Tobias Alexander, M.D. 2019-10-21T18:14:39   CT SCANS  CT CORONARY MORPH W/CTA COR W/SCORE 11/14/2017  Addendum 11/15/2017  8:53 AM ADDENDUM REPORT: 11/15/2017 08:51  CLINICAL  DATA:  Aortic stenosis  EXAM: Cardiac TAVR CT  TECHNIQUE: The patient was scanned on a Siemens Force 192 slice scanner. A 120 kV retrospective scan was triggered in the descending thoracic aorta at 111 HU's. Gantry rotation speed was 270 msecs and collimation was .9 mm. No beta blockade or nitro were given. The 3D data set was reconstructed in 5% intervals of the R-R cycle. Systolic and diastolic phases were analyzed on a dedicated work station using MPR, MIP and VRT modes. The patient received 80 cc of contrast.  FINDINGS: Aortic Valve: Tri leaflet and calcified with restricted leaflet motion  Aorta: No aneurysm moderate calcific atherosclerosis  Sinotubular Junction: 26 mm  Ascending Thoracic Aorta: 32 mm  Aortic Arch: 24 mm  Descending Thoracic Aorta: 23 mm  Sinus of Valsalva Measurements:  Non-coronary: 28 mm  Right - coronary: 26 mm  Left - coronary: 25.7 mm  Coronary Artery Height above  Annulus:  Left Main: 13 mm above annulus  Right Coronary: 13.7 mm above annulus  Virtual Basal Annulus Measurements:  Maximum/Minimum Diameter: 24.4 mm x 19.9 mm  Perimeter: 70.5 mm  Area: 383 mm2  Coronary Arteries: Sufficient height above annulus for deployment  Optimum Fluoroscopic Angle for Delivery: LAO 23 degrees Caudal 1 degree  IMPRESSION: 1. Calcified tri leaflet AV with annular area of 383 mm 2 suitable for a 23 mm Sapien 3 valve  2.  Coronary arteries sufficient height above annulus for deployment  3.  No LAA thrombus  4. Optimum angiographic angle for deployment LAO 23 degrees Caudal 1 degree  Charlton Haws   Electronically Signed By: Charlton Haws M.D. On: 11/15/2017 08:51  Narrative EXAM: OVER-READ INTERPRETATION  CT CHEST  The following report is an over-read performed by radiologist Dr. Trudie Reed of [facility] on 11/14/2017. This over-read does not include interpretation of cardiac or coronary anatomy or pathology. The coronary CTA interpretation by the cardiologist is attached.  COMPARISON:  None.  FINDINGS: Extracardiac findings will be described separately under dictation for contemporaneously obtained CTA chest, abdomen and pelvis.  IMPRESSION: Please see separate dictation for contemporaneously obtained CTA chest, abdomen and pelvis 11/14/2017 for full description of relevant extracardiac findings.  Electronically Signed: By: Trudie Reed M.D. On: 11/14/2017 14:59          EKG:        Recent Labs: 02/11/2023: ALT 16 02/22/2023: Magnesium 2.4 03/08/2023: B Natriuretic Peptide 867.3; Hemoglobin 10.1; Platelets 167 03/11/2023: BUN 30; Creatinine, Ser 1.25; Potassium 3.7; Sodium 140  Recent Lipid Panel    Component Value Date/Time   CHOL 146 01/23/2022 0313   TRIG 120 02/12/2023 0405   HDL 36 (L) 01/23/2022 0313   CHOLHDL 4.1 01/23/2022 0313   VLDL 19 01/23/2022 0313   LDLCALC 91 01/23/2022 0313      Risk Assessment/Calculations:   {Does this patient have ATRIAL FIBRILLATION?:[phone]}  No BP recorded.  {Refresh Note OR Click here to enter BP  :1}***         Physical Exam:    VS:  There were no vitals taken for this visit.    Wt Readings from Last 3 Encounters:  03/08/23 282 lb 13.6 oz (128.3 kg)  03/01/23 282 lb 13.6 oz (128.3 kg)  01/22/22 262 lb 9.1 oz (119.1 kg)     GEN: *** Well nourished, well developed in no acute distress HEENT: Normal NECK: No JVD; No carotid bruits LYMPHATICS: No lymphadenopathy CARDIAC: ***RRR, no murmurs, rubs, gallops RESPIRATORY:  Clear to  auscultation without rales, wheezing or rhonchi  ABDOMEN: Soft, non-tender, non-distended MUSCULOSKELETAL:  No edema; No deformity  SKIN: Warm and dry NEUROLOGIC:  Alert and oriented x 3 PSYCHIATRIC:  Normal affect   Assessment & Plan S/P TAVR (transcatheter aortic valve replacement)  Morbid obesity (HCC)  Chronic diastolic heart failure (HCC)  Essential hypertension        {Are you ordering a CV Procedure (e.g. stress test, cath, DCCV, TEE, etc)?   Press F2        :[id number]}    Medication Adjustments/Labs and Tests Ordered: Current medicines are reviewed at length with the patient today.  Concerns regarding medicines are outlined above.  No orders of the defined types were placed in this encounter.  No orders of the defined types were placed in this encounter.   There are no Patient Instructions on file for this visit.   Signed, Tonny Bollman, MD  03/25/2023 6:27 AM    [facility]

## 2023-03-28 ENCOUNTER — Encounter: Payer: Self-pay | Admitting: Cardiovascular Disease

## 2023-06-28 ENCOUNTER — Ambulatory Visit: Admitting: Physician Assistant

## 2023-07-20 ENCOUNTER — Ambulatory Visit: Attending: Emergency Medicine | Admitting: Emergency Medicine

## 2023-07-20 ENCOUNTER — Encounter: Payer: Self-pay | Admitting: Emergency Medicine

## 2023-07-20 VITALS — BP 108/50 | HR 78 | Ht 60.0 in

## 2023-07-20 DIAGNOSIS — I469 Cardiac arrest, cause unspecified: Secondary | ICD-10-CM

## 2023-07-20 DIAGNOSIS — Z952 Presence of prosthetic heart valve: Secondary | ICD-10-CM

## 2023-07-20 DIAGNOSIS — I2510 Atherosclerotic heart disease of native coronary artery without angina pectoris: Secondary | ICD-10-CM

## 2023-07-20 DIAGNOSIS — I5020 Unspecified systolic (congestive) heart failure: Secondary | ICD-10-CM | POA: Diagnosis not present

## 2023-07-20 DIAGNOSIS — I350 Nonrheumatic aortic (valve) stenosis: Secondary | ICD-10-CM

## 2023-07-20 DIAGNOSIS — E785 Hyperlipidemia, unspecified: Secondary | ICD-10-CM

## 2023-07-20 NOTE — Progress Notes (Signed)
 Cardiology Office Note:    Date:  07/20/2023  ID:  [name], DOB 11-12-1940, MRN [id number] PCP: Candiss Chamorro, MD  [facility] Providers Cardiologist:  Arnoldo Lapping, MD       Patient Profile:       Chief Complaint: Hospital follow-up for HFrEF History of Present Illness:  [name] is a 83 y.o. female with visit-pertinent history of nonobstructive CAD, severe aortic stenosis status post TAVR in 11/2017, HFrEF, cardiac arrest s/p septic shock, fibromyalgia, T2DM, hypertension, hyperlipidemia, and obesity  She was evaluated for severe aortic stenosis and underwent TAVR on 12/06/2017.  As part of that preprocedure workup she underwent R/LHC on 11/02/2017 that showed mild nonobstructive CAD with 10% proximal to mid LAD stenosis and 10% mid RCA stenosis.  Echo from 01/28/2021 showed an EF of 55 to 60%, possible apical inferoseptal and apical wall hypokinesis, through the views are slightly off of access, moderate LVH with a basal septum with remaining LV segments demonstrating mild to moderate concentric LVH, grade 2 diastolic dysfunction, normal RV function exercise, mildly elevated RVSP, trivial MR with severe MAC with normal structure and function of TAVR.  She was last seen in clinic by Dr. Arlester Ladd on 01/2021.  She was doing well at the time.  1 year follow-up was recommended.  She was admitted to [facility] on 09/2022 with acute on chronic HFpEF due to missing diuretic.  She was IV diuresed with symptomatic improvement.  Echo during that admission showed an EF of 45%.  She was admitted to the hospital on 02/11/2023 and discharged 03/02/2023 for cardiac arrest.  She was brought to the ED for shortness of breath.  Shortly after arrival in triage she became unresponsive with no palpable pulse.  CPR was initiated.  ED note indicated the patient appeared to be in PEA arrest.  She received 2 rounds of epinephrine  and CPR with ROSC.  She was subsequently intubated and sedated. She was  successfully extubated on 02/12/2023.  It was noted she went into septic shock due to bilateral lower extremity cellulitis and UTI.  She was noted to have new LV dysfunction noted on echocardiogram with a EF of 30 to 35% with global hypokinesis.  Cardiac catheterization completed 02/17/2023 revealed mild nonobstructive coronary artery disease.  She was again admitted from 03/08/2023 through 03/11/2023 for acute on chronic CHF.  She presented with shortness of breath and increasing bilateral lower extremity edema.  She was treated and managed with IV Lasix .  She was discharged in stable condition.  Lastly she was admitted at [facility] on 06/21/2023 for heart failure exacerbation.  She presented with 1 day of progressive dyspnea.  Initially required PPV, quickly transitioned to room air.  She was treated with IV Lasix  and metolazone with good urine output and improvement in her symptoms.  She was transition back to home Lasix  80 mg twice daily.  The thought was this was likely from his diuretic dose and general challenges in taking medications.  She was discharged home in stable condition.   Discussed the use of AI scribe software for clinical note transcription with the patient, who gave verbal consent to proceed.  History of Present Illness [name] is an 83 year old female with heart failure who presents for follow-up after recent hospitalizations.  Today patient notes she is doing well overall.  She is currently living at home.  She notes adherence to her prescribed medication regimen.  She denies any current chest pain, shortness of  breath, orthopnea, PND.  Her lower extremity swelling remains stable.  Her weights at home have been stable.  Notes this is the best she has felt in quite some time.  Review of systems:  Please see the history of present illness. All other systems are reviewed and otherwise negative.      Studies Reviewed:        Echocardiogram 02/12/2023 1. Left ventricular  ejection fraction, by estimation, is 30 to 35%. The  left ventricle has moderately decreased function. The left ventricle  demonstrates regional wall motion abnormalities (see scoring  diagram/findings for description) and global  hypokinesis. There is mild concentric left ventricular hypertrophy. Left  ventricular diastolic parameters are consistent with Grade II diastolic  dysfunction (pseudonormalization). Elevated left ventricular end-diastolic  pressure. There is akinesis of the   left ventricular, apical septal wall.   2. Right ventricular systolic function is normal. The right ventricular  size is normal.   3. Left atrial size was severely dilated.   4. The mitral valve is degenerative. No evidence of mitral valve  regurgitation. Mild mitral stenosis. The mean mitral valve gradient is 4.0  mmHg. Severe mitral annular calcification.   5. The aortic valve has been repaired/replaced. Perivalvular Aortic valve  regurgitation is mild. 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  There is a 26 mm CoreValve-Evolut Pro prosthetic (TAVR) valve present in  the aortic position. Aortic  regurgitation PHT measures 557 msec. Aortic valve area, by VTI measures  0.93 cm. Aortic valve mean gradient measures 15.0 mmHg. Aortic valve Vmax  measures 2.70 m/s. DVI 0.37.   6. The inferior vena cava is normal in size with greater than 50%  respiratory variability, suggesting right atrial pressure of 3 mmHg.   7. Compared to echo dated 01/28/2021, LVF has declined but apical  inferoseptal wall motion abnormality is unchanged. The mean TAVR gradient  has increased from to and DVI has decreased from 0.92 to  0.37.   Cardiac catheterization 02/17/2023   Prox LAD to Mid LAD lesion is 10% stenosed.   Mid RCA lesion is 10% stenosed.   1.  Mild nonobstructive coronary artery disease. 2.  Mildly elevated left ventricular end-diastolic pressure at 14 mmHg. 3.  Very difficult engagement of  the left coronary arteries due to being jailed by the TAVR prosthesis.\ 4.  Minimal gradient across the TAVR prosthesis   Prox LAD to Mid LAD lesion is 10% stenosed.   Mid RCA lesion is 10% stenosed.   1.  Mild nonobstructive coronary artery disease. 2.  Mildly elevated left ventricular end-diastolic pressure at 14 mmHg. 3.  Very difficult engagement of the left coronary arteries due to being jailed by the TAVR prosthesis.\ 4.  Minimal gradient across the TAVR prosthesis Risk Assessment/Calculations:              Physical Exam:   VS:  BP (!) 108/50 (BP Location: Right Wrist, Patient Position: Sitting, Cuff Size: Normal)   Pulse 78   Ht 5' (1.524 m)   BMI 55.24 kg/m    Wt Readings from Last 3 Encounters:  03/08/23 282 lb 13.6 oz (128.3 kg)  03/01/23 282 lb 13.6 oz (128.3 kg)  01/22/22 262 lb 9.1 oz (119.1 kg)    GEN: Well nourished, well developed in no acute distress NECK: No JVD; No carotid bruits CARDIAC: RRR, no murmurs, rubs, gallops RESPIRATORY:  Clear to auscultation without rales, wheezing or rhonchi  ABDOMEN: Soft, non-tender, non-distended EXTREMITIES: 1+ bilateral lower extremity  edema; No acute deformity      Assessment and Plan:  HFrEF Echocardiogram 01/2023 noted new LV dysfunction with LVEF of 30 to 35% with global hypokinesis Reduced EF was during hospitalization after cardiac arrest LHC 01/2023 revealed mild nonobstructive CAD Since her cardiac arrest/acute systolic heart failure dx she has had 2 separate admissions for heart failure exacerbation where she received IV diuresis with good UOP and D/C in stable conditions - Today patient is euvolemic and well compensated on exam.  NYHA class II symptoms.  Notes prior exacerbations are likely due to missed home diuretic.  She has remained compliant to her current medication regimen. - Today denies any dyspnea, orthopnea, PND.  Leg swelling stable.  Weight is stable - Further GDMT titration limited due to low  normal blood pressure of 108/50 and CKD (GFR 32, creatinine 1.6 on 06/2023) - Continue current GDMT of carvedilol  3.125 mg twice daily, Jardiance 10 mg daily, enalapril  10 mg daily, Lasix  40 mg twice daily - Will repeat echocardiogram to reevaluate her LV function.  If LV function remains <35% we will refer to EP for CRT-D evaluation  Acute respiratory failure / PEA arrest In the setting of acute respiratory failure due to septic shock from bilateral lower extremity cellulitis and UTI - Today she remains hemodynamically stable  Aortic stenosis s/p TAVR S/p TAVR in 2019 Echocardiogram 01/2023 showed perivalvular aortic valve regurgitation is mild and mild to moderate prosthetic aortic valve stenosis - Today she denies any chest pain, dyspnea, syncope - Repeat echocardiogram for routine monitoring as noted above  Coronary artery disease / Hyperlipidemia LHC 01/2023 showed mild nonobstructive CAD with proximal LAD to mid LAD 10% stenosis and mid RCA 10% stenosis LDL 67, HDL 38, TG 172 on 05/4008 LDL at goal of less than 70 - Continue atorvastatin  40 mg daily     Dispo:  Return in about 3 months (around 10/20/2023).  Signed, Ava Boatman, NP

## 2023-07-20 NOTE — Patient Instructions (Signed)
 Medication Instructions:  NO CHANGES   Lab Work: NONE   Testing/Procedures: Your physician has requested that you have an echocardiogram. Echocardiography is a painless test that uses sound waves to create images of your heart. It provides your doctor with information about the size and shape of your heart and how well your heart's chambers and valves are working. This procedure takes approximately one hour. There are no restrictions for this procedure. Please do NOT wear cologne, perfume, aftershave, or lotions (deodorant is allowed). Please arrive 15 minutes prior to your appointment time.  Please note: We ask at that you not bring children with you during ultrasound (echo/ vascular) testing. Due to room size and safety concerns, children are not allowed in the ultrasound rooms during exams. Our front office staff cannot provide observation of children in our lobby area while testing is being conducted. An adult accompanying a patient to their appointment will only be allowed in the ultrasound room at the discretion of the ultrasound technician under special circumstances. We apologize for any inconvenience.   Follow-Up: At [facility], you and your health needs are our priority.  As part of our continuing mission to provide you with exceptional heart care, our providers are all part of one team.  This team includes your primary Cardiologist (physician) and Advanced Practice Providers or APPs (Physician Assistants and Nurse Practitioners) who all work together to provide you with the care you need, when you need it.  Your next appointment:   2-3 MONTHS POST ECHO  Provider:   MADISON FOUNTAIN, DNP

## 2023-08-31 ENCOUNTER — Ambulatory Visit (HOSPITAL_COMMUNITY): Attending: Cardiovascular Disease

## 2023-09-08 ENCOUNTER — Encounter (HOSPITAL_COMMUNITY): Payer: Self-pay | Admitting: Emergency Medicine

## 2023-09-15 ENCOUNTER — Telehealth: Payer: Self-pay | Admitting: *Deleted

## 2023-09-15 NOTE — Telephone Encounter (Signed)
 Unable to leave a message mailbox is full.

## 2023-09-22 ENCOUNTER — Ambulatory Visit: Admitting: Emergency Medicine

## 2023-11-10 ENCOUNTER — Encounter: Payer: Self-pay | Admitting: Emergency Medicine

## 2023-12-06 NOTE — Progress Notes (Deleted)
 Cardiology Office Note:  .   Date:  12/06/2023  ID:  [name], DOB [birth year], MRN [id number] PCP: Loring Tanda Mae, MD  [facility] Providers Cardiologist:  Ozell Fell, MD {   History of Present Illness: [name]   [name] is a 83 y.o. female  with PMHx of mild nonobstructive CAD (Cath 11/02/2017 & 02/17/2023: 10% proximal to mid LAD stenosis and 10% mid RCA stenosis), Severe AS s/p TAVR in 11/2017, HTN, HFrEF (most recent EF of 30% 11/14/2023), cardiac arrest s/p septic shock in 01/2023, DM2, diabetic ulcers, fatty liver disease, GERD, chronic dysphagia, history of colon polyps, and chronic venous stasis  who reports to [facility] office for hospital follow up.   Last seen in heartcare OV 07/20/2023 for hospital follow up related to acute HF that required treatment with IV Lasix  and metolazone. Doing well from cardiology standpoint w/o any complaints or signs of volume overload. Continued on Lipitor  40 mg daily, Coreg  3.125 mg BID, Enalapril  10 mg daily, Lasix  40 mg BID, KCL 20 meq daily.   Prior to admission patient taken Levaquin  for LE cellulitis.  Recent admission to [facility] health 9/21-10/02/2023.  Completed Zosyn and Vancomycin  while admitted and negative biopsy, however patient continued to have significant edema that was treated with IV diurese.  New large blister formed on the dorsum of the right foot, which was opened/drained then wound care team provided recommendations.  Echo 11/14/2023 showed normal TAVR valve function with mild paravalvular valvular regurgitation, EF 30%, moderate mitral annular calcification, mild MV regurgitation, mild MV stenosis, mildly dilated LA.  Found to be in shock requiring norepinephrine  support.  IVF resuscitation limited due to cardiac history and history of flash pulmonary edema due to prior hospitalizations.  Hospital course complicated by AKI (Cr 1.98 >1.6 w/ baseline Cr ~1.3) thought to be prerenal/hypoperfusion in the setting of shock which  resolved prior to discharge.  Continue to hold enalapril  10 mg daily and Coreg  3.125 mg BID.  Started on metformin 1000 mg twice daily and Lasix  80 mg daily.  Continued on Jardiance 10 mg daily.   Today, reports ### and denies ###.  Reports compliance with medications.  Dietary habitats:  Activity level:  Social: Denies tobacco use/alcohol/drug use  Denies any recent hospitalizations or visits to the emergency department.   HFrEF (heart failure with reduced ejection fraction)  Nonischemic cardiomyopathy Previous cath as below without significant stenosis EF of 55-60% in 11/2017 & 12/2017, 60-65% in 11/2018, 55-60% in 01/2021, 45% in 09/2022, 30-35% in 01/2023 after cardiac arrest Multiple hospitalization for acute HF after cardiac arrest requiring IV diuresis.  ECHO 11/14/2023: EF 30%, mildly dilated LV with mild wall thickness, mild MV stenosis/regurgitation, moderate mitral annular calcification, mildly dilated LA  Denies SOB, edema, orthopnea, or PND. Appears Euvolemic on exam.  Continue on  Encouraged low sodium diet, fluid restriction <2L, and daily weights.  Educated to contact our office for weight gain of 2 lbs overnight or 5 lbs in one week. ED precautions discussed.    Primary hypertension Reports well controlled Home BP:  BP this OV well controlled today:  Managed by GDMT as above.  Encourage physical activity for 150 minutes per week and heart healthy low sodium diet. Discussed limiting sodium intake to < 2 grams daily.     Coronary artery disease involving native coronary artery of native heart without angina pectoris Hyperlipidemia LDL goal <70 Cath 11/02/2017 & 02/17/2023: 10% proximal to mid LAD stenosis and 10% mid RCA stenosis  Denies angina symptoms. No need for further ischemic evaluation at this time.  LDL 60 in 10/2022 and LFT WNL in 01/2023.  Continue ASA 81 mg,  Not on ASA due ?    Severe aortic stenosis S/P TAVR (transcatheter aortic valve replacement) ECHO  11/14/2023: normal TAVR function with mild paravalvular regurgitation Denies any chest pain, dyspnea, syncope  Systolic harsh #/6 best heard at  OR No murmur noted on exam.  Repeat ECHO in # or    Cardiac arrest  In the setting of acute respiratory failure due to septic shock from bilateral lower extremity cellulitis and UTI  Remains hemodynamically stable   ROS: 10 point review of system has been reviewed and considered negative except ones been listed in the HPI.   Studies Reviewed: .   Cardiac catheterization 02/17/2023   Prox LAD to Mid LAD lesion is 10% stenosed.   Mid RCA lesion is 10% stenosed.   1.  Mild nonobstructive coronary artery disease. 2.  Mildly elevated left ventricular end-diastolic pressure at 14 mmHg. 3.  Very difficult engagement of the left coronary arteries due to being jailed by the TAVR prosthesis.\ 4.  Minimal gradient across the TAVR prosthesis   Prox LAD to Mid LAD lesion is 10% stenosed.   Mid RCA lesion is 10% stenosed.   1.  Mild nonobstructive coronary artery disease. 2.  Mildly elevated left ventricular end-diastolic pressure at 14 mmHg. 3.  Very difficult engagement of the left coronary arteries due to being jailed by the TAVR prosthesis.\ 4.  Minimal gradient across the TAVR prosthesis  Echo 11/14/23 Summary 1. Aortic valve replacement (26 mm Medtronic Evolute TAVR valve, implantation date: 12/06/2017). 2. There is mild paravalvular regurgitation of the prosthetic aortic valve. 3. Aortic valve Doppler indices are consistent with normal prosthetic valve function. 4. The left ventricle is mildly dilated in size with mildly increased wall thickness. 5. The left ventricular systolic function is severely decreased, LVEF is visually estimated at 30%. 6. Mitral annular calcification is present (moderate). 7. The mitral valve leaflets are moderately thickened with reduced leaflet mobility. 8. There is mild mitral valve regurgitation. 9. There is  mild mitral stenosis. 10. The left atrium is mildly dilated in size. 11. The right ventricle is not well visualized but is probably normal in size, with normal systolic function. 12. Technically difficult study.  Risk Assessment/Calculations:   {Does this patient have ATRIAL FIBRILLATION?[phone]} No BP recorded.  {Refresh Note OR Click here to enter BP  :1}***       Physical Exam:   VS:  There were no vitals taken for this visit.   Wt Readings from Last 3 Encounters:  03/08/23 282 lb 13.6 oz (128.3 kg)  03/01/23 282 lb 13.6 oz (128.3 kg)  01/22/22 262 lb 9.1 oz (119.1 kg)    GEN: Well nourished, well developed in no acute distress while sitting in chair.  NECK: No JVD; No carotid bruits CARDIAC: ***RRR, no murmurs, rubs, gallops RESPIRATORY:  Clear to auscultation without rales, wheezing or rhonchi  ABDOMEN: Soft, non-tender, non-distended EXTREMITIES:  No edema; No deformity   ASSESSMENT AND PLAN: .   ***    {Are you ordering a CV Procedure (e.g. stress test, cath, DCCV, TEE, etc)?   Press F2        :[id number]}  Dispo: ***  Signed, Lorette CINDERELLA Kapur, PA-C

## 2023-12-07 ENCOUNTER — Ambulatory Visit: Attending: Cardiovascular Disease | Admitting: Nurse Practitioner

## 2023-12-07 DIAGNOSIS — I469 Cardiac arrest, cause unspecified: Secondary | ICD-10-CM

## 2023-12-07 DIAGNOSIS — I2510 Atherosclerotic heart disease of native coronary artery without angina pectoris: Secondary | ICD-10-CM

## 2023-12-07 DIAGNOSIS — E785 Hyperlipidemia, unspecified: Secondary | ICD-10-CM

## 2023-12-07 DIAGNOSIS — I350 Nonrheumatic aortic (valve) stenosis: Secondary | ICD-10-CM

## 2023-12-07 DIAGNOSIS — Z952 Presence of prosthetic heart valve: Secondary | ICD-10-CM

## 2023-12-07 DIAGNOSIS — I5020 Unspecified systolic (congestive) heart failure: Secondary | ICD-10-CM

## 2023-12-07 DIAGNOSIS — I10 Essential (primary) hypertension: Secondary | ICD-10-CM

## 2023-12-07 DIAGNOSIS — I428 Other cardiomyopathies: Secondary | ICD-10-CM

## 2024-01-09 NOTE — Progress Notes (Deleted)
 " Cardiology Office Note   Date:  01/09/2024  ID:  [name], DOB [birth year], MRN [id number] PCP: Cheryl Tanda Mae, MD  [facility] Providers Cardiologist:  Cheryl Fell, MD { Click to update primary MD,subspecialty MD or APP then REFRESH:1}    History of Present Illness [name] is a 83 y.o. 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 Fell in 2019 at the behest of her PCP for evaluation of aortic stenosis.  She had had a right and left heart cath in 2015 with recommendations for ongoing surveillance, however at the time of her visit she had had 2 syncopal episodes.  She underwent cardiac catheterization revealing severe aortic stenosis and on 12/06/2017 she underwent TAVR with 26 mm Medtronic CoreValve Evolut Pro.  Initially after procedure EKG revealed LBBB however this later resolved.  In December 2024 she was admitted to the hospital for shortness of breath, became pulseless and apneic in the ED, suffered a PEA arrest requiring CPR and epinephrine  eventually achieving ROSC, this was in the setting of septic shock secondary to lower extremity cellulitis and UTI.  Echo at that time revealed  an EF of 30 to 35%, she underwent a cardiac catheterization revealing mild nonobstructive CAD.  She was again admitted around January 2025 for heart failure exacerbation, required IV Lasix .  She was admitted to [name] in April 2025 again for heart failure exacerbation.  Most recently evaluated by [name] on 07/20/2023, this was following multiple hospitalizations, she was overall doing well, mention this was the best she had felt in some time, it does not appear any changes were made to medications or plan of care but a repeat echocardiogram was arranged however it does not appear this was completed and she was advised to follow-up in 3 months.  In the interim it does appear she has been admitted to [name] in September 2025 it appears again for shock, possibly secondary to cellulitis.  Echo revealed EF 30%, she did require nor epi infusion and ultimately her Coreg  had to be held.  She was ultimately discharged home.  Review of prior notes show patient also taking Coreg  3.125 BID and Atorvastatin  40 daily - these should be continued with PCP upon follow up. On discharge patient prescribed 80 po lasix  daily, jardiance, metformin, donepezil, and potassium supplement. We have held her enalapril  and coreg .   She seen Dr. Lovella since she been out of the hospital and had repeat lab work creatinine 1.24 potassium 3.8 sodium 142 GFR 44 on day of discharge  ROS: ROS   Studies Reviewed      Cardiac Studies & Procedures   ______________________________________________________________________________________________ CARDIAC CATHETERIZATION  CARDIAC CATHETERIZATION 02/17/2023  Conclusion   Prox LAD to Mid LAD lesion is 10% stenosed.   Mid  RCA lesion is 10% stenosed.  1.  Mild nonobstructive coronary artery disease. 2.  Mildly elevated left ventricular end-diastolic pressure at 14 mmHg. 3.  Very difficult engagement of the left coronary arteries due to being jailed by the TAVR prosthesis.\ 4.  Minimal  gradient across the TAVR prosthesis  Recommendations: Medical therapy for nonischemic cardiomyopathy. I switch IV furosemide  to oral.  Findings Coronary Findings Diagnostic  Dominance: Right  Left Anterior Descending Vessel is large. Prox LAD to Mid LAD lesion is 10% stenosed.  First Diagonal Branch Vessel is moderate in size.  Left Circumflex  First Obtuse Marginal Branch  Right Coronary Artery Vessel is large. Mid RCA lesion is 10% stenosed.  Intervention  No interventions have been documented.   CARDIAC CATHETERIZATION  CARDIAC CATHETERIZATION 11/02/2017  Conclusion  Mid RCA lesion is 10% stenosed.  Prox LAD to Mid LAD lesion is 10% stenosed.  1. Mild non-obstructive CAD 2. Severe aortic stenosis (peak to peak gradient 62 mmHg, mean gradient 51.6 mmHg, AVA 0.82 cm2)  Recommendations: Will continue workup for TAVR.  Findings Coronary Findings Diagnostic  Dominance: Right  Left Anterior Descending Vessel is large. Prox LAD to Mid LAD lesion is 10% stenosed.  First Diagonal Branch Vessel is moderate in size.  Left Circumflex  First Obtuse Marginal Branch  Right Coronary Artery Vessel is large. Mid RCA lesion is 10% stenosed.  Intervention  No interventions have been documented.     ECHOCARDIOGRAM  ECHOCARDIOGRAM COMPLETE 02/12/2023  Narrative ECHOCARDIOGRAM REPORT    Patient Name:   [name] Hayashida Date of Exam: 02/12/2023 Medical Rec #:  [id number]      Height:       62.0 in Accession #:    [id number]     Weight:       290.1 lb Date of Birth:  1940/09/10      BSA:          2.239 m Patient Age:    82 years       BP:           104/59 mmHg Patient Gender: F              HR:           61 bpm. Exam Location:  [facility]  Procedure: 2D Echo and Intracardiac Opacification Agent  Indications:     Cardiac Arrest I46.9  History:         Patient has prior history of Echocardiogram examinations, most recent 01/28/2021. Aortic Valve: 26 mm  CoreValve-Evolut Pro prosthetic, stented (TAVR) valve is present in the aortic position.  Sonographer:     [name] RDCS, FASE Referring Phys:  [id number] [name] Diagnosing Phys: Wilbert Bihari MD   Sonographer Comments: Echo performed with patient supine and on artificial respirator, patient is obese and no subcostal window. Image acquisition challenging due to patient body habitus and Image acquisition challenging due to respiratory motion. IMPRESSIONS   1. Left ventricular ejection fraction, by estimation, is 30 to 35%. The left ventricle has moderately decreased function. The left ventricle demonstrates regional wall motion abnormalities (see scoring diagram/findings for description) and global hypokinesis. There is mild concentric left ventricular hypertrophy. Left ventricular diastolic parameters are consistent with Grade II diastolic dysfunction (pseudonormalization). Elevated left ventricular end-diastolic pressure. There is akinesis of the left ventricular, apical septal wall. 2. Right ventricular systolic function is normal. The right ventricular size is normal. 3. Left atrial size was severely dilated. 4. The mitral valve is degenerative. No  evidence of mitral valve regurgitation. Mild mitral stenosis. The mean mitral valve gradient is 4.0 mmHg. Severe mitral annular calcification. 5. The aortic valve has been repaired/replaced. Perivalvular Aortic valve regurgitation is mild. 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 There is a 26 mm CoreValve-Evolut Pro prosthetic (TAVR) valve present in the aortic position. Aortic regurgitation PHT measures 557 msec. Aortic valve area, by VTI measures 0.93 cm. Aortic valve mean gradient measures 15.0 mmHg. Aortic valve Vmax measures 2.70 m/s. DVI 0.37. 6. The inferior vena cava is normal in size with greater than 50% respiratory variability, suggesting right atrial pressure of 3 mmHg. 7. Compared to echo dated 01/28/2021, LVF  has declined but apical inferoseptal wall motion abnormality is unchanged. The mean TAVR gradient has increased from to and DVI has decreased from 0.92 to 0.37.  FINDINGS Left Ventricle: Left ventricular ejection fraction, by estimation, is 30 to 35%. The left ventricle has moderately decreased function. The left ventricle demonstrates regional wall motion abnormalities. Definity  contrast agent was given IV to delineate the left ventricular endocardial borders. The left ventricular internal cavity size was normal in size. There is mild concentric left ventricular hypertrophy. Left ventricular diastolic parameters are consistent with Grade II diastolic dysfunction (pseudonormalization). Elevated left ventricular end-diastolic pressure.  Right Ventricle: The right ventricular size is normal. No increase in right ventricular wall thickness. Right ventricular systolic function is normal.  Left Atrium: Left atrial size was severely dilated.  Right Atrium: Right atrial size was normal in size.  Pericardium: There is no evidence of pericardial effusion.  Mitral Valve: The mitral valve is degenerative in appearance. There is moderate thickening of the mitral valve leaflet(s). There is mild calcification of the mitral valve leaflet(s). Severe mitral annular calcification. No evidence of mitral valve regurgitation. Mild mitral valve stenosis. MV peak gradient, 8.8 mmHg. The mean mitral valve gradient is 4.0 mmHg.  Tricuspid Valve: The tricuspid valve is normal in structure. Tricuspid valve regurgitation is trivial. No evidence of tricuspid stenosis.  Aortic Valve: The aortic valve has been repaired/replaced. Aortic valve regurgitation is mild. Aortic regurgitation PHT measures 557 msec. Mild to moderate aortic stenosis is present. Aortic valve mean gradient measures 15.0 mmHg. Aortic valve peak gradient measures 29.1 mmHg. Aortic valve area, by VTI measures 0.93 cm. There is a 26 mm  CoreValve-Evolut Pro prosthetic, stented (TAVR) valve present in the aortic position.  Pulmonic Valve: The pulmonic valve was normal in structure. Pulmonic valve regurgitation is not visualized. No evidence of pulmonic stenosis.  Aorta: The aortic root is normal in size and structure.  Venous: The inferior vena cava is normal in size with greater than 50% respiratory variability, suggesting right atrial pressure of 3 mmHg.  IAS/Shunts: No atrial level shunt detected by color flow Doppler.   LEFT VENTRICLE PLAX 2D LVIDd:         5.00 cm      Diastology LVIDs:         3.90 cm      LV e' medial:    5.11 cm/s LV PW:         1.20 cm      LV E/e' medial:  24.5 LV IVS:        1.30 cm      LV e' lateral:   4.35 cm/s LVOT diam:     1.80 cm      LV E/e' lateral: 28.7 LV SV:         56 LV SV Index:  25 LVOT Area:     2.54 cm  LV Volumes (MOD) LV vol d, MOD A4C: 126.0 ml LV vol s, MOD A4C: 81.5 ml LV SV MOD A4C:     126.0 ml  RIGHT VENTRICLE RV Basal diam:  2.80 cm RV S prime:     13.20 cm/s TAPSE (M-mode): 2.0 cm  LEFT ATRIUM              Index        RIGHT ATRIUM           Index LA diam:        5.40 cm  2.41 cm/m   RA Area:     10.90 cm LA Vol (A2C):   173.0 ml 77.26 ml/m  RA Volume:   22.40 ml  10.00 ml/m LA Vol (A4C):   93.1 ml  41.58 ml/m LA Biplane Vol: 134.0 ml 59.84 ml/m AORTIC VALVE                     PULMONIC VALVE AV Area (Vmax):    0.92 cm      PV Vmax:       0.95 m/s AV Area (Vmean):   0.92 cm      PV Peak grad:  3.6 mmHg AV Area (VTI):     0.93 cm AV Vmax:           269.67 cm/s AV Vmean:          181.333 cm/s AV VTI:            0.599 m AV Peak Grad:      29.1 mmHg AV Mean Grad:      15.0 mmHg LVOT Vmax:         97.40 cm/s LVOT Vmean:        65.400 cm/s LVOT VTI:          0.220 m LVOT/AV VTI ratio: 0.37 AI PHT:            557 msec  AORTA Ao Root diam: 2.50 cm Ao Asc diam:  3.20 cm  MITRAL VALVE                TRICUSPID VALVE MV Area (PHT): 1.82  cm     TV Peak grad:   34.6 mmHg MV Area VTI:   1.18 cm     TV Vmax:        2.94 m/s MV Peak grad:  8.8 mmHg MV Mean grad:  4.0 mmHg     SHUNTS MV Vmax:       1.48 m/s     Systemic VTI:  0.22 m MV Vmean:      91.2 cm/s    Systemic Diam: 1.80 cm MV Decel Time: 416 msec MV E velocity: 125.00 cm/s MV A velocity: 126.00 cm/s MV E/A ratio:  0.99  Wilbert Bihari MD Electronically signed by Wilbert Bihari MD Signature Date/Time: 02/12/2023/4:31:48 PM    Final   TEE  ECHO TEE 12/06/2017  Narrative *[place]* *[name]* [address] [place], [place] [address] [phone]  ------------------------------------------------------------------- Transesophageal Echocardiography  Patient:    Edynn, Gillock MR #:       [id number] Study Date: 12/06/2017 Gender:     F Age:        77 Height:     156.2 cm Weight:     138.7 kg BSA:        2.55 m^2 Pt. Status: Room:  4E22C  SONOGRAPHER  Lyle Marc, RCS ADMITTING    [facility]ell, MD ATTENDING    [facility], MD ORDERING     [facility], MD REFERRING    [facility], MD PERFORMING   Leim Moose, M.D.  cc:  ------------------------------------------------------------------- LV EF: 55% -   60%  ------------------------------------------------------------------- Indications:      Aortic stenosis 424.1.  ------------------------------------------------------------------- History:   PMH:   Aortic valve disease.  Risk factors: Hypertension. Diabetes mellitus. Dyslipidemia.  ------------------------------------------------------------------- Study Conclusions  - Left ventricle: Systolic function was normal. The estimated ejection fraction was in the range of 55% to 60%. Wall motion was normal; there were no regional wall motion abnormalities. - Aortic valve: Valve mobility was restricted. There was mild regurgitation. Valve area (VTI): 0.91 cm^2. Valve area (Vmean): 0.91 cm^2. -  Mitral valve: There was mild regurgitation. - Left atrium: No evidence of thrombus in the atrial cavity or appendage. - Right atrium: No evidence of thrombus in the atrial cavity or appendage. - Atrial septum: There was increased thickness of the septum, consistent with lipomatous hypertrophy. - Pulmonary arteries: PA peak pressure: 45 mm Hg (S).  Impressions:  - This was a periprocedural TEE during a TAVR procedure. Native aortic valve was severely thickened and calcified with severely restricted leaflet opening. Peak/mean gradients are 87/47 mmHg consistent with severe aortic stenosis. A 26 mm Medtronic Evolut Pro valve was successfully deployed in the aortic position. There was mild to moderate paravalvular leak post deployment. A postdilation with an extra cc of saline was performed with improvement of the paravalvular leak to mild. Postdeployment transaortic gradients were 17/7 mmHg.  ------------------------------------------------------------------- Study data:   Study status:  Routine.  Consent:  The risks, benefits, and alternatives to the procedure were explained to the patient and informed consent was obtained.  Procedure:  The patient reported no pain pre or post test. Initial setup. The patient was brought to the laboratory. Surface ECG leads were monitored. Sedation. General anesthesia was administered by anesthesiology staff. Sedation. Conscious sedation was administered. Transesophageal echocardiography. Topical anesthesia was obtained using viscous lidocaine . A transesophageal probe was inserted by the attending cardiologist. Image quality was adequate.  Study completion:  The patient tolerated the procedure well. There were no complications.          Diagnostic transesophageal echocardiography.  2D and color Doppler.  Birthdate:  Patient birthdate: 08-12-1940.  Age:  Patient is 83 yr old.  Sex:  Gender: female.    BMI: 56.9 kg/m^2.  Blood pressure:     112/70   Patient status:  Inpatient.  Study date:  Study date: 12/06/2017. Study time: 10:39 AM.  Location:  Operating room.  -------------------------------------------------------------------  ------------------------------------------------------------------- Left ventricle:  Systolic function was normal. The estimated ejection fraction was in the range of 55% to 60%. Wall motion was normal; there were no regional wall motion abnormalities.  ------------------------------------------------------------------- Aortic valve:  S/P TAVR with a A 26 mm Medtronic Evolut Pro valve. Trileaflet; severely thickened, severely calcified leaflets. Cusp separation was normal. Valve mobility was restricted.  Doppler: There was mild regurgitation.    VTI ratio of LVOT to aortic valve: 0.24. Valve area (VTI): 0.91 cm^2. Indexed valve area (VTI): 0.36 cm^2/m^2. Mean velocity ratio of LVOT to aortic valve: 0.24. Valve area (Vmean): 0.91 cm^2. Indexed valve area (Vmean): 0.36 cm^2/m^2. Mean gradient (S): 48 mm Hg.  ------------------------------------------------------------------- Aorta:  There was no atheroma. There was no evidence for dissection. Aortic root: The aortic root was not dilated. Ascending aorta: The  ascending aorta was normal in size. Aortic arch: The aortic arch was normal in size. Descending aorta: The descending aorta was normal in size.  ------------------------------------------------------------------- Mitral valve:   Structurally normal valve.   Leaflet separation was normal.  Doppler:  There was mild regurgitation.  ------------------------------------------------------------------- Left atrium:  The atrium was normal in size.  No evidence of thrombus in the atrial cavity or appendage. The appendage was morphologically a left appendage, multilobulated, and of normal size. Emptying velocity was normal.  ------------------------------------------------------------------- Atrial  septum:  There was increased thickness of the septum, consistent with lipomatous hypertrophy.  ------------------------------------------------------------------- Right ventricle:  The cavity size was normal. Wall thickness was normal. Systolic function was normal.  ------------------------------------------------------------------- Pulmonic valve:    Structurally normal valve.  ------------------------------------------------------------------- Tricuspid valve:   Structurally normal valve.   Leaflet separation was normal.  Doppler:  There was mild regurgitation.  ------------------------------------------------------------------- Pulmonary artery:   The main pulmonary artery was normal-sized.  ------------------------------------------------------------------- Right atrium:  The atrium was normal in size.  No evidence of thrombus in the atrial cavity or appendage. The appendage was morphologically a right appendage.  ------------------------------------------------------------------- Pericardium:  There was no pericardial effusion.  ------------------------------------------------------------------- Pre bypass:  Post bypass: Pre bypass: Post bypass:  ------------------------------------------------------------------- Measurements  Left ventricle                            Value          Reference Stroke volume, 2D                         95    ml       --------- Stroke volume/bsa, 2D                     37    ml/m^2   ---------  LVOT                                      Value          Reference LVOT ID, S                                22    mm       --------- LVOT area                                 3.8   cm^2     --------- LVOT mean velocity, S                     78.9  cm/s     --------- LVOT VTI, S                               24.9  cm       ---------  Aortic valve                              Value          Reference Aortic valve mean velocity, S  330    cm/s     --------- Aortic valve VTI, S                       104   cm       --------- Aortic mean gradient, S                   48    mm Hg    --------- VTI ratio, LVOT/AV                        0.24           --------- Aortic valve area, VTI                    0.91  cm^2     --------- Aortic valve area/bsa, VTI                0.36  cm^2/m^2 --------- Velocity ratio, mean, LVOT/AV             0.24           --------- Aortic valve area, mean velocity          0.91  cm^2     --------- Aortic valve area/bsa, mean               0.36  cm^2/m^2 --------- velocity  Pulmonary arteries                        Value          Reference PA pressure, S, DP                 (H)    45    mm Hg    <=30  Legend: (L)  and  (H)  mark values outside specified reference range.  ------------------------------------------------------------------- Prepared and Electronically Authenticated by  Leim Moose, M.D. 2019-10-21T18:14:39    CT SCANS  CT CORONARY MORPH W/CTA COR W/SCORE 11/14/2017  Addendum 11/15/2017  8:53 AM ADDENDUM REPORT: 11/15/2017 08:51  CLINICAL DATA:  Aortic stenosis  EXAM: Cardiac TAVR CT  TECHNIQUE: The patient was scanned on a Siemens Force 192 slice scanner. A 120 kV retrospective scan was triggered in the descending thoracic aorta at 111 HU's. Gantry rotation speed was 270 msecs and collimation was .9 mm. No beta blockade or nitro were given. The 3D data set was reconstructed in 5% intervals of the R-R cycle. Systolic and diastolic phases were analyzed on a dedicated work station using MPR, MIP and VRT modes. The patient received 80 cc of contrast.  FINDINGS: Aortic Valve: Tri leaflet and calcified with restricted leaflet motion  Aorta: No aneurysm moderate calcific atherosclerosis  Sinotubular Junction: 26 mm  Ascending Thoracic Aorta: 32 mm  Aortic Arch: 24 mm  Descending Thoracic Aorta: 23 mm  Sinus of Valsalva Measurements:  Non-coronary: 28 mm  Right  - coronary: 26 mm  Left - coronary: 25.7 mm  Coronary Artery Height above Annulus:  Left Main: 13 mm above annulus  Right Coronary: 13.7 mm above annulus  Virtual Basal Annulus Measurements:  Maximum/Minimum Diameter: 24.4 mm x 19.9 mm  Perimeter: 70.5 mm  Area: 383 mm2  Coronary Arteries: Sufficient height above annulus for deployment  Optimum Fluoroscopic Angle for Delivery: LAO 23 degrees Caudal 1 degree  IMPRESSION: 1. Calcified tri leaflet AV with annular area of 383 mm 2 suitable for a  23 mm Sapien 3 valve  2.  Coronary arteries sufficient height above annulus for deployment  3.  No LAA thrombus  4. Optimum angiographic angle for deployment LAO 23 degrees Caudal 1 degree  Maude Emmer   Electronically Signed By: Maude Emmer M.D. On: 11/15/2017 08:51  Narrative EXAM: OVER-READ INTERPRETATION  CT CHEST  The following report is an over-read performed by radiologist Dr. Toribio Aye of [name] on 11/14/2017. This over-read does not include interpretation of cardiac or coronary anatomy or pathology. The coronary CTA interpretation by the cardiologist is attached.  COMPARISON:  None.  FINDINGS: Extracardiac findings will be described separately under dictation for contemporaneously obtained CTA chest, abdomen and pelvis.  IMPRESSION: Please see separate dictation for contemporaneously obtained CTA chest, abdomen and pelvis 11/14/2017 for full description of relevant extracardiac findings.  Electronically Signed: By: Toribio Aye M.D. On: 11/14/2017 14:59     ______________________________________________________________________________________________      Risk Assessment/Calculations {Does this patient have ATRIAL FIBRILLATION?:[phone]} No BP recorded.  {Refresh Note OR Click here to enter BP  :1}***       Physical Exam VS:  There were no vitals taken for this visit.       Wt Readings from Last 3 Encounters:   03/08/23 282 lb 13.6 oz (128.3 kg)  03/01/23 282 lb 13.6 oz (128.3 kg)  01/22/22 262 lb 9.1 oz (119.1 kg)    GEN: Well nourished, well developed in no acute distress NECK: No JVD; No carotid bruits CARDIAC: ***RRR, no murmurs, rubs, gallops RESPIRATORY:  Clear to auscultation without rales, wheezing or rhonchi  ABDOMEN: Soft, non-tender, non-distended EXTREMITIES:  No edema; No deformity   ASSESSMENT AND PLAN HFrEF -  Aortic stenosis s/p TAVR in 2019-    {Are you ordering a CV Procedure (e.g. stress test, cath, DCCV, TEE, etc)?   Press F2        :[id number]}  Dispo: ***  Signed, Delon JAYSON Hoover, NP  "

## 2024-01-12 ENCOUNTER — Ambulatory Visit: Payer: Self-pay | Admitting: Cardiology

## 2024-01-12 DIAGNOSIS — Z952 Presence of prosthetic heart valve: Secondary | ICD-10-CM

## 2024-01-12 DIAGNOSIS — I5020 Unspecified systolic (congestive) heart failure: Secondary | ICD-10-CM

## 2024-01-12 DIAGNOSIS — I350 Nonrheumatic aortic (valve) stenosis: Secondary | ICD-10-CM

## 2024-01-27 DIAGNOSIS — E785 Hyperlipidemia, unspecified: Secondary | ICD-10-CM

## 2024-01-27 DIAGNOSIS — I10 Essential (primary) hypertension: Secondary | ICD-10-CM

## 2024-01-27 DIAGNOSIS — I340 Nonrheumatic mitral (valve) insufficiency: Secondary | ICD-10-CM

## 2024-01-27 DIAGNOSIS — I454 Nonspecific intraventricular block: Secondary | ICD-10-CM

## 2024-01-27 DIAGNOSIS — I429 Cardiomyopathy, unspecified: Secondary | ICD-10-CM

## 2024-01-27 DIAGNOSIS — I509 Heart failure, unspecified: Secondary | ICD-10-CM

## 2024-01-27 DIAGNOSIS — I272 Pulmonary hypertension, unspecified: Secondary | ICD-10-CM

## 2024-01-28 DIAGNOSIS — I10 Essential (primary) hypertension: Secondary | ICD-10-CM

## 2024-01-28 DIAGNOSIS — Z953 Presence of xenogenic heart valve: Secondary | ICD-10-CM

## 2024-01-28 DIAGNOSIS — I429 Cardiomyopathy, unspecified: Secondary | ICD-10-CM

## 2024-01-28 DIAGNOSIS — R579 Shock, unspecified: Secondary | ICD-10-CM

## 2024-01-29 DIAGNOSIS — I447 Left bundle-branch block, unspecified: Secondary | ICD-10-CM

## 2024-01-29 DIAGNOSIS — I493 Ventricular premature depolarization: Secondary | ICD-10-CM

## 2024-01-29 DIAGNOSIS — R000 Tachycardia, unspecified: Secondary | ICD-10-CM

## 2024-02-23 DEATH — deceased

## 2024-03-01 ENCOUNTER — Ambulatory Visit: Admitting: Physician Assistant

## 2024-04-13 ENCOUNTER — Encounter (HOSPITAL_COMMUNITY)

## 2024-04-13 ENCOUNTER — Encounter: Admitting: Vascular Surgery
